# Patient Record
Sex: Female | Born: 1960 | Race: White | Hispanic: No | Smoking: Current every day smoker
Health system: Southern US, Community
[De-identification: ages and names within clinical notes are randomized; demographics above are authoritative.]

## PROBLEM LIST (undated history)

## (undated) DIAGNOSIS — C3431 Malignant neoplasm of lower lobe, right bronchus or lung: Secondary | ICD-10-CM

## (undated) DIAGNOSIS — F3181 Bipolar II disorder: Secondary | ICD-10-CM

## (undated) DIAGNOSIS — Z9181 History of falling: Secondary | ICD-10-CM

## (undated) DIAGNOSIS — F21 Schizotypal disorder: Secondary | ICD-10-CM

## (undated) DIAGNOSIS — J449 Chronic obstructive pulmonary disease, unspecified: Secondary | ICD-10-CM

## (undated) DIAGNOSIS — J45909 Unspecified asthma, uncomplicated: Secondary | ICD-10-CM

## (undated) DIAGNOSIS — IMO0002 Reserved for concepts with insufficient information to code with codable children: Secondary | ICD-10-CM

## (undated) DIAGNOSIS — K219 Gastro-esophageal reflux disease without esophagitis: Secondary | ICD-10-CM

## (undated) DIAGNOSIS — Z95828 Presence of other vascular implants and grafts: Secondary | ICD-10-CM

## (undated) DIAGNOSIS — I10 Essential (primary) hypertension: Secondary | ICD-10-CM

## (undated) HISTORY — PX: APPENDECTOMY: SHX54

## (undated) HISTORY — PX: STOMACH SURGERY: SHX791

## (undated) HISTORY — DX: Chronic obstructive pulmonary disease, unspecified: J44.9

## (undated) HISTORY — PX: NASAL SINUS SURGERY: SHX719

## (undated) HISTORY — DX: Presence of other vascular implants and grafts: Z95.828

## (undated) HISTORY — PX: ABDOMINAL HYSTERECTOMY: SHX81

## (undated) HISTORY — PX: OTHER SURGICAL HISTORY: SHX169

## (undated) HISTORY — DX: Malignant neoplasm of lower lobe, right bronchus or lung: C34.31

---

## 2006-07-21 ENCOUNTER — Emergency Department: Payer: Self-pay | Admitting: Emergency Medicine

## 2008-09-24 ENCOUNTER — Emergency Department: Payer: Self-pay | Admitting: Emergency Medicine

## 2011-06-23 ENCOUNTER — Emergency Department: Payer: Self-pay | Admitting: *Deleted

## 2014-09-27 DIAGNOSIS — F17200 Nicotine dependence, unspecified, uncomplicated: Secondary | ICD-10-CM | POA: Insufficient documentation

## 2014-09-27 DIAGNOSIS — M545 Low back pain, unspecified: Secondary | ICD-10-CM | POA: Insufficient documentation

## 2014-10-06 DIAGNOSIS — J449 Chronic obstructive pulmonary disease, unspecified: Secondary | ICD-10-CM | POA: Insufficient documentation

## 2014-10-06 DIAGNOSIS — J439 Emphysema, unspecified: Secondary | ICD-10-CM | POA: Insufficient documentation

## 2014-10-14 DIAGNOSIS — F39 Unspecified mood [affective] disorder: Secondary | ICD-10-CM | POA: Insufficient documentation

## 2015-06-21 DIAGNOSIS — I10 Essential (primary) hypertension: Secondary | ICD-10-CM | POA: Insufficient documentation

## 2015-06-21 DIAGNOSIS — R918 Other nonspecific abnormal finding of lung field: Secondary | ICD-10-CM | POA: Insufficient documentation

## 2015-06-21 DIAGNOSIS — B370 Candidal stomatitis: Secondary | ICD-10-CM | POA: Insufficient documentation

## 2015-06-26 DIAGNOSIS — R252 Cramp and spasm: Secondary | ICD-10-CM | POA: Insufficient documentation

## 2015-06-29 DIAGNOSIS — Z9189 Other specified personal risk factors, not elsewhere classified: Secondary | ICD-10-CM | POA: Insufficient documentation

## 2015-07-12 ENCOUNTER — Inpatient Hospital Stay: Payer: Self-pay | Attending: Oncology | Admitting: Oncology

## 2015-07-12 ENCOUNTER — Inpatient Hospital Stay: Payer: Self-pay

## 2015-07-12 ENCOUNTER — Encounter: Payer: Self-pay | Admitting: Oncology

## 2015-07-12 VITALS — BP 161/112 | HR 98 | Temp 97.8°F | Resp 18 | Ht 68.0 in | Wt 147.3 lb

## 2015-07-12 DIAGNOSIS — Z803 Family history of malignant neoplasm of breast: Secondary | ICD-10-CM

## 2015-07-12 DIAGNOSIS — F1910 Other psychoactive substance abuse, uncomplicated: Secondary | ICD-10-CM

## 2015-07-12 DIAGNOSIS — M25519 Pain in unspecified shoulder: Secondary | ICD-10-CM

## 2015-07-12 DIAGNOSIS — C3490 Malignant neoplasm of unspecified part of unspecified bronchus or lung: Secondary | ICD-10-CM

## 2015-07-12 DIAGNOSIS — M549 Dorsalgia, unspecified: Secondary | ICD-10-CM

## 2015-07-12 DIAGNOSIS — Z808 Family history of malignant neoplasm of other organs or systems: Secondary | ICD-10-CM

## 2015-07-12 DIAGNOSIS — G8929 Other chronic pain: Secondary | ICD-10-CM

## 2015-07-12 DIAGNOSIS — F17210 Nicotine dependence, cigarettes, uncomplicated: Secondary | ICD-10-CM

## 2015-07-12 DIAGNOSIS — R419 Unspecified symptoms and signs involving cognitive functions and awareness: Secondary | ICD-10-CM

## 2015-07-12 DIAGNOSIS — J449 Chronic obstructive pulmonary disease, unspecified: Secondary | ICD-10-CM

## 2015-07-12 DIAGNOSIS — C3431 Malignant neoplasm of lower lobe, right bronchus or lung: Secondary | ICD-10-CM

## 2015-07-12 DIAGNOSIS — F319 Bipolar disorder, unspecified: Secondary | ICD-10-CM

## 2015-07-12 DIAGNOSIS — R05 Cough: Secondary | ICD-10-CM

## 2015-07-12 HISTORY — DX: Malignant neoplasm of lower lobe, right bronchus or lung: C34.31

## 2015-07-12 LAB — COMPREHENSIVE METABOLIC PANEL
ALK PHOS: 65 U/L (ref 38–126)
ALT: 13 U/L — ABNORMAL LOW (ref 14–54)
ANION GAP: 6 (ref 5–15)
AST: 25 U/L (ref 15–41)
Albumin: 3.9 g/dL (ref 3.5–5.0)
BILIRUBIN TOTAL: 0.5 mg/dL (ref 0.3–1.2)
BUN: 14 mg/dL (ref 6–20)
CALCIUM: 8.8 mg/dL — AB (ref 8.9–10.3)
CO2: 27 mmol/L (ref 22–32)
Chloride: 102 mmol/L (ref 101–111)
Creatinine, Ser: 0.6 mg/dL (ref 0.44–1.00)
GFR calc non Af Amer: >60 mL/min (ref >60)
Glucose, Bld: 96 mg/dL (ref 65–99)
POTASSIUM: 3.9 mmol/L (ref 3.5–5.1)
SODIUM: 135 mmol/L (ref 135–145)
TOTAL PROTEIN: 6.8 g/dL (ref 6.5–8.1)

## 2015-07-12 LAB — CBC WITH DIFFERENTIAL/PLATELET
Basophils Absolute: 0.1 10*3/uL (ref 0–0.1)
Basophils Relative: 1 %
EOS ABS: 0.3 10*3/uL (ref 0–0.7)
Eosinophils Relative: 2 %
HEMATOCRIT: 38.4 % (ref 35.0–47.0)
HEMOGLOBIN: 12.9 g/dL (ref 12.0–16.0)
LYMPHS ABS: 2.2 10*3/uL (ref 1.0–3.6)
Lymphocytes Relative: 17 %
MCH: 32.4 pg (ref 26.0–34.0)
MCHC: 33.5 g/dL (ref 32.0–36.0)
MCV: 96.7 fL (ref 80.0–100.0)
MONO ABS: 0.8 10*3/uL (ref 0.2–0.9)
MONOS PCT: 6 %
NEUTROS PCT: 74 %
Neutro Abs: 9.5 10*3/uL — ABNORMAL HIGH (ref 1.4–6.5)
Platelets: 291 10*3/uL (ref 150–440)
RBC: 3.97 MIL/uL (ref 3.80–5.20)
RDW: 13.6 % (ref 11.5–14.5)
WBC: 12.8 10*3/uL — ABNORMAL HIGH (ref 3.6–11.0)

## 2015-07-12 LAB — LACTATE DEHYDROGENASE: LDH: 192 U/L (ref 98–192)

## 2015-07-12 MED ORDER — LORAZEPAM 1 MG PO TABS: ORAL_TABLET | ORAL | Status: DC

## 2015-07-12 NOTE — Progress Notes (Signed)
[name] @ [facility] Telephone:([phone]) [phone]  Fax:(336) Yell: [birth year]  MR#: [id number]  SEG#:[id number]  Charlene [name] Care Team: Jiles Garter, MD as PCP - General (Family Medicine)  CHIEF COMPLAINT:  Chief Complaint  Charlene [name] presents with  . 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  Needle aspiration of lymph node was positive for small cell carcinoma of lung. Clinically  Staged  As  T1 N2 M0 tumor.  Further staging workup is pending VISIT DIAGNOSIS:     ICD-9-CM ICD-10-CM   1. Small cell lung cancer, unspecified laterality (HCC) 162.9 C34.90 amLODipine (NORVASC) 5 MG tablet     diclofenac (VOLTAREN) 75 MG EC tablet     ondansetron (ZOFRAN) 4 MG tablet     DULoxetine (CYMBALTA) 60 MG capsule     benzonatate (TESSALON PERLES) 100 MG capsule     acetaminophen (TYLENOL) 500 MG tablet     cyclobenzaprine (FLEXERIL) 10 MG tablet     lidocaine (XYLOCAINE) 5 % ointment     lidocaine (ASPERCREME W/LIDOCAINE) 4 % cream     albuterol (PROVENTIL HFA;VENTOLIN HFA) 108 (90 Base) MCG/ACT inhaler     nystatin (MYCOSTATIN) 100000 UNIT/ML suspension     fluticasone (FLONASE) 50 MCG/ACT nasal spray     Saline (AYR SALINE NASAL DROPS) 0.65 % (Soln) SOLN     loratadine (CLARITIN) 10 MG tablet     CBC with Differential     Comprehensive metabolic panel     Lactate dehydrogenase     NM PET Image Initial (PI) Skull Base To Thigh     CT Head W Wo Contrast  2. Primary cancer of right lower lobe of lung (HCC) 162.5 C34.31       No history exists.    No flowsheet data found.  INTERVAL HISTORY: 55 year old Charlene [name] with a multiple other comorbid condition.  Had the polysubstance abuse.  Bipolar disease.  COPD.  Chronic back pain and shoulder pain.  Mood disorder. Charlene [name] presented to emergency room at [facility] complaining of back pain left  shoulder pain and had MRI scan done.  Charlene [name] complains of cough yellowish expectoration sometime blood stained sputum.  Sees very poor historian.  Extremely nervous and agitated Charlene [name].  Accompanied with his brother REVIEW OF SYSTEMS:   Gen. status: Extremely agitated and anxious Charlene [name].  Not any acute distress. Lungs: Increasing shortness of breath.  Cough. HEENT: No headache no dizziness. GI: No nausea no vomiting but has lost some weight.  No diarrhea.  No hematemesis or melena Musculoskeletal system back pain and left shoulder pain. Charlene [name] has chronic pain.  Recently had MRI scan of left shoulder which is was revealing tendon tear. Cardiac: No chest pain no palpitation GU: No dysuria hematuria Skin: No rash Lower extremity no swelling Psychiatric system: Charlene [name] has bipolar disease.  Polysubstance abuse. Charlene [name] lives with Charlene [name] father.   As per HPI. Otherwise, a complete review of systems is negatve.  PAST MEDICAL HISTORY: Past Medical History  Diagnosis Date  . Primary cancer of right lower lobe of lung ([place]) 07/12/2015    Small cell undifferentiated carcinoma of lung.  Diagnosis at [facility] by fine-needle aspiration of lymph node (January, 2017)    PAST SURGICAL HISTORY: Abdominal hernia surgery. Hysterectomy.  Appendectomy.  FAMILY HISTORY  BROTHER HAD CHOLANGIOCARCINOMA AND HODGKIN'S DISEASE.   OTHER SISTER HAD  A BREAST CANCER.   GYNECOLOGIC HISTORY:    Charlene [name]  had a hysterectomy in the past    ADVANCED DIRECTIVES:    HEALTH MAINTENANCE: Social History  Substance Use Topics  . Smoking status: Current Some Day Smoker  . Smokeless tobacco: None  . Alcohol Use: None     Colonoscopy:  PAP:  Bone density:  Lipid panel:  Allergies  Allergen Reactions  . Amoxicillin-Pot Clavulanate Hives    Also vomiting    Current Outpatient Prescriptions  Medication Sig Dispense Refill  . acetaminophen (TYLENOL) 500 MG tablet Take 1,000 mg by mouth.    Marland Kitchen albuterol  (PROVENTIL HFA;VENTOLIN HFA) 108 (90 Base) MCG/ACT inhaler Inhale into the lungs.    Marland Kitchen amLODipine (NORVASC) 5 MG tablet Take 5 mg by mouth.    . benzonatate (TESSALON PERLES) 100 MG capsule Take 100 mg by mouth.    . cyclobenzaprine (FLEXERIL) 10 MG tablet Take 10 mg by mouth.    . diclofenac (VOLTAREN) 75 MG EC tablet Take 75 mg by mouth.    . DULoxetine (CYMBALTA) 60 MG capsule Take 60 mg by mouth.    . fluticasone (FLONASE) 50 MCG/ACT nasal spray 1 spray by Each Nare route daily.    Marland Kitchen lidocaine (ASPERCREME W/LIDOCAINE) 4 % cream Apply 2 g topically Three (3) times a day as needed.    . lidocaine (XYLOCAINE) 5 % ointment Apply topically.    Marland Kitchen loratadine (CLARITIN) 10 MG tablet Take 10 mg by mouth.    . nystatin (MYCOSTATIN) 100000 UNIT/ML suspension Take by mouth.    . ondansetron (ZOFRAN) 4 MG tablet Take 4 mg by mouth.    . Saline (AYR SALINE NASAL DROPS) 0.65 % (Soln) SOLN 2 drops by Each Nare route every four (4) hours as needed.     No current facility-administered medications for this visit.    OBJECTIVE: PHYSICAL EXAM: Gen. status: Extremely anxious Charlene [name]. Head exam was generally normal. There was no scleral icterus or corneal arcus. Mucous membranes were moist. Examination of the throat is within normal limit Chest: Diminished air entry on both sides.  Occasional rhonchi.  Emphysema test chest. Cardiac: Tachycardia Abdominal exam revealed normal bowel sounds. The abdomen was soft, non-tender, and without masses, organomegaly, or appreciable enlargement of the abdominal aorta. Neurologically, the Charlene [name] was awake, alert, and oriented to person, place and time. There were no obvious focal neurologic abnormalities. Examination of the skin revealed no evidence of significant rashes, suspicious appearing nevi or other concerning lesions. Lower extremity no edema Lymphatic system: Supraclavicular, cervical, axillary, inguinal lymph nodes are not palpable   Filed Vitals:   07/12/15  0910  BP: 161/112  Pulse: 98  Temp: 97.8 F (36.6 C)  Resp: 18     There is no height on file to calculate BMI.    ECOG FS:1 - Symptomatic but completely ambulatory  LAB RESULTS:  Lab data from outside institution has been reviewed  CT scan from the [facility] system has been reviewed independently.    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 Proceed with PET scan to complete staging workup as well as CT scan of brain We discussed possibility of chemotherapy with Cis-platinum and VP-16 versus carboplatinum  and VP-16 depending on the stage of the disease Possibility of radiation therapy for localized disease is found in the chest Charlene [name] will attend chemotherapy class. Port will be placed.. All the side effects of chemotherapy including myelosuppression, alopecia, nausea vomiting fatigue weakness.  Secondary infection, and   peripheral neuropathy .  Has been discussed in details. Informal consent has been obtained and will be documented by nurses in the chart    Charlene [name] expressed understanding and was in agreement with this plan. Charlene [name] also understands that Charlene [name] can call clinic at any time with any questions, concerns, or complaints.    Primary cancer of right lower lobe of lung ([place])   Staging form: Lung, AJCC 7th Edition     Clinical: T1, N2, M0 - Signed by Forest Gleason, MD on 07/12/2015   Forest Gleason, MD   07/12/2015 9:46 AM

## 2015-07-12 NOTE — Patient Instructions (Signed)
Cisplatin injection What is this medicine? CISPLATIN (SIS pla tin) is a chemotherapy drug. It targets fast dividing cells, like cancer cells, and causes these cells to die. This medicine is used to treat many types of cancer like bladder, ovarian, and testicular cancers. This medicine may be used for other purposes; ask your health care provider or pharmacist if you have questions. What should I tell my health care provider before I take this medicine? They need to know if you have any of these conditions: -blood disorders -hearing problems -kidney disease -recent or ongoing radiation therapy -an unusual or allergic reaction to cisplatin, carboplatin, other chemotherapy, other medicines, foods, dyes, or preservatives -pregnant or trying to get pregnant -breast-feeding How should I use this medicine? This drug is given as an infusion into a vein. It is administered in a hospital or clinic by a specially trained health care professional. Talk to your pediatrician regarding the use of this medicine in children. Special care may be needed. Overdosage: If you think you have taken too much of this medicine contact a poison control center or emergency room at once. NOTE: This medicine is only for you. Do not share this medicine with others. What if I miss a dose? It is important not to miss a dose. Call your doctor or health care professional if you are unable to keep an appointment. What may interact with this medicine? -dofetilide -foscarnet -medicines for seizures -medicines to increase blood counts like filgrastim, pegfilgrastim, sargramostim -probenecid -pyridoxine used with altretamine -rituximab -some antibiotics like amikacin, gentamicin, neomycin, polymyxin B, streptomycin, tobramycin -sulfinpyrazone -vaccines -zalcitabine Talk to your doctor or health care professional before taking any of these medicines: -acetaminophen -aspirin -ibuprofen -ketoprofen -naproxen This list may  not describe all possible interactions. Give your health care provider a list of all the medicines, herbs, non-prescription drugs, or dietary supplements you use. Also tell them if you smoke, drink alcohol, or use illegal drugs. Some items may interact with your medicine. What should I watch for while using this medicine? Your condition will be monitored carefully while you are receiving this medicine. You will need important blood work done while you are taking this medicine. This drug may make you feel generally unwell. This is not uncommon, as chemotherapy can affect healthy cells as well as cancer cells. Report any side effects. Continue your course of treatment even though you feel ill unless your doctor tells you to stop. In some cases, you may be given additional medicines to help with side effects. Follow all directions for their use. Call your doctor or health care professional for advice if you get a fever, chills or sore throat, or other symptoms of a cold or flu. Do not treat yourself. This drug decreases your body's ability to fight infections. Try to avoid being around people who are sick. This medicine may increase your risk to bruise or bleed. Call your doctor or health care professional if you notice any unusual bleeding. Be careful brushing and flossing your teeth or using a toothpick because you may get an infection or bleed more easily. If you have any dental work done, tell your dentist you are receiving this medicine. Avoid taking products that contain aspirin, acetaminophen, ibuprofen, naproxen, or ketoprofen unless instructed by your doctor. These medicines may hide a fever. Do not become pregnant while taking this medicine. Women should inform their doctor if they wish to become pregnant or think they might be pregnant. There is a potential for serious side effects to   an unborn child. Talk to your health care professional or pharmacist for more information. Do not breast-feed an  infant while taking this medicine. Drink fluids as directed while you are taking this medicine. This will help protect your kidneys. Call your doctor or health care professional if you get diarrhea. Do not treat yourself. What side effects may I notice from receiving this medicine? Side effects that you should report to your doctor or health care professional as soon as possible: -allergic reactions like skin rash, itching or hives, swelling of the face, lips, or tongue -signs of infection - fever or chills, cough, sore throat, pain or difficulty passing urine -signs of decreased platelets or bleeding - bruising, pinpoint red spots on the skin, black, tarry stools, nosebleeds -signs of decreased red blood cells - unusually weak or tired, fainting spells, lightheadedness -breathing problems -changes in hearing -gout pain -low blood counts - This drug may decrease the number of white blood cells, red blood cells and platelets. You may be at increased risk for infections and bleeding. -nausea and vomiting -pain, swelling, redness or irritation at the injection site -pain, tingling, numbness in the hands or feet -problems with balance, movement -trouble passing urine or change in the amount of urine Side effects that usually do not require medical attention (report to your doctor or health care professional if they continue or are bothersome): -changes in vision -loss of appetite -metallic taste in the mouth or changes in taste This list may not describe all possible side effects. Call your doctor for medical advice about side effects. You may report side effects to FDA at 1-800-FDA-1088. Where should I keep my medicine? This drug is given in a hospital or clinic and will not be stored at home. NOTE: This sheet is a summary. It may not cover all possible information. If you have questions about this medicine, talk to your doctor, pharmacist, or health care provider.    2016, Elsevier/Gold  Standard. (2007-09-07 14:40:54) Etoposide, VP-16 injection What is this medicine? ETOPOSIDE, VP-16 (e toe POE side) is a chemotherapy drug. It is used to treat testicular cancer, lung cancer, and other cancers. This medicine may be used for other purposes; ask your health care provider or pharmacist if you have questions. What should I tell my health care provider before I take this medicine? They need to know if you have any of these conditions: -infection -kidney disease -low blood counts, like low white cell, platelet, or red cell counts -an unusual or allergic reaction to etoposide, other chemotherapeutic agents, other medicines, foods, dyes, or preservatives -pregnant or trying to get pregnant -breast-feeding How should I use this medicine? This medicine is for infusion into a vein. It is administered in a hospital or clinic by a specially trained health care professional. Talk to your pediatrician regarding the use of this medicine in children. Special care may be needed. Overdosage: If you think you have taken too much of this medicine contact a poison control center or emergency room at once. NOTE: This medicine is only for you. Do not share this medicine with others. What if I miss a dose? It is important not to miss your dose. Call your doctor or health care professional if you are unable to keep an appointment. What may interact with this medicine? -aspirin -certain medications for seizures like carbamazepine, phenobarbital, phenytoin, valproic acid -cyclosporine -levamisole -warfarin This list may not describe all possible interactions. Give your health care provider a list of all the medicines, herbs,  non-prescription drugs, or dietary supplements you use. Also tell them if you smoke, drink alcohol, or use illegal drugs. Some items may interact with your medicine. What should I watch for while using this medicine? Visit your doctor for checks on your progress. This drug may  make you feel generally unwell. This is not uncommon, as chemotherapy can affect healthy cells as well as cancer cells. Report any side effects. Continue your course of treatment even though you feel ill unless your doctor tells you to stop. In some cases, you may be given additional medicines to help with side effects. Follow all directions for their use. Call your doctor or health care professional for advice if you get a fever, chills or sore throat, or other symptoms of a cold or flu. Do not treat yourself. This drug decreases your body's ability to fight infections. Try to avoid being around people who are sick. This medicine may increase your risk to bruise or bleed. Call your doctor or health care professional if you notice any unusual bleeding. Be careful brushing and flossing your teeth or using a toothpick because you may get an infection or bleed more easily. If you have any dental work done, tell your dentist you are receiving this medicine. Avoid taking products that contain aspirin, acetaminophen, ibuprofen, naproxen, or ketoprofen unless instructed by your doctor. These medicines may hide a fever. Do not become pregnant while taking this medicine or for at least 6 months after stopping it. Women should inform their doctor if they wish to become pregnant or think they might be pregnant. Women of child-bearing potential will need to have a negative pregnancy test before starting this medicine. There is a potential for serious side effects to an unborn child. Talk to your health care professional or pharmacist for more information. Do not breast-feed an infant while taking this medicine. Men must use a latex condom during sexual contact with a woman while taking this medicine and for at least 4 months after stopping it. A latex condom is needed even if you have had a vasectomy. Contact your doctor right away if your partner becomes pregnant. Do not donate sperm while taking this medicine and for  at least 4 months after you stop taking this medicine. Men should inform their doctors if they wish to father a child. This medicine may lower sperm counts. What side effects may I notice from receiving this medicine? Side effects that you should report to your doctor or health care professional as soon as possible: -allergic reactions like skin rash, itching or hives, swelling of the face, lips, or tongue -low blood counts - this medicine may decrease the number of white blood cells, red blood cells and platelets. You may be at increased risk for infections and bleeding. -signs of infection - fever or chills, cough, sore throat, pain or difficulty passing urine -signs of decreased platelets or bleeding - bruising, pinpoint red spots on the skin, black, tarry stools, blood in the urine -signs of decreased red blood cells - unusually weak or tired, fainting spells, lightheadedness -breathing problems -changes in vision -mouth or throat sores or ulcers -pain, redness, swelling or irritation at the injection site -pain, tingling, numbness in the hands or feet -redness, blistering, peeling or loosening of the skin, including inside the mouth -seizures -vomiting Side effects that usually do not require medical attention (report to your doctor or health care professional if they continue or are bothersome): -diarrhea -hair loss -loss of appetite -nausea -stomach pain  This list may not describe all possible side effects. Call your doctor for medical advice about side effects. You may report side effects to FDA at 1-800-FDA-1088. Where should I keep my medicine? This drug is given in a hospital or clinic and will not be stored at home. NOTE: This sheet is a summary. It may not cover all possible information. If you have questions about this medicine, talk to your doctor, pharmacist, or health care provider.    2016, Elsevier/Gold Standard. (2014-01-26 12:32:50)

## 2015-07-12 NOTE — Progress Notes (Signed)
Patient here today as new evaluation for small cell lung cancer referred by Dr. Stoney Bang @ [facility].  States she also fell at work injuring her left shoulder.

## 2015-07-13 ENCOUNTER — Encounter: Payer: Self-pay | Admitting: Oncology

## 2015-07-13 ENCOUNTER — Telehealth: Payer: Self-pay | Admitting: *Deleted

## 2015-07-13 MED ORDER — LORAZEPAM 1 MG PO TABS: ORAL_TABLET | ORAL | Status: DC

## 2015-07-13 NOTE — Telephone Encounter (Signed)
Called to [facility] per pt request, call to [facility] to cancel rx sent there adn spoke with Corene Cornea

## 2015-07-16 ENCOUNTER — Ambulatory Visit
Admission: RE | Admit: 2015-07-16 | Discharge: 2015-07-16 | Disposition: A | Discharge status: Home / Self Care | Payer: Self-pay | Source: Ambulatory Visit | Attending: Oncology | Admitting: Oncology

## 2015-07-16 DIAGNOSIS — C3490 Malignant neoplasm of unspecified part of unspecified bronchus or lung: Secondary | ICD-10-CM

## 2015-07-16 DIAGNOSIS — C771 Secondary and unspecified malignant neoplasm of intrathoracic lymph nodes: Secondary | ICD-10-CM | POA: Insufficient documentation

## 2015-07-16 DIAGNOSIS — Z0189 Encounter for other specified special examinations: Secondary | ICD-10-CM | POA: Insufficient documentation

## 2015-07-16 LAB — GLUCOSE, CAPILLARY: GLUCOSE-CAPILLARY: 74 mg/dL (ref 65–99)

## 2015-07-16 MED ORDER — IOHEXOL 300 MG/ML  SOLN: 75.0000 mL | Freq: Once | INTRAMUSCULAR | Status: DC | PRN

## 2015-07-16 MED ORDER — FLUDEOXYGLUCOSE F - 18 (FDG) INJECTION
12.0000 | Freq: Once | INTRAVENOUS | Status: AC | PRN
  Administered 2015-07-16: 12 via INTRAVENOUS

## 2015-07-17 ENCOUNTER — Inpatient Hospital Stay: Payer: Self-pay

## 2015-07-17 NOTE — Progress Notes (Unsigned)
PSN met with patient and her father today.  PSN informed patient about the Social Security Disability process, as well as the financial hardship program through [employer].  Patient will receive assistance through the cancer center charitable funds.

## 2015-07-18 ENCOUNTER — Inpatient Hospital Stay: Payer: Medicaid Other | Attending: Oncology | Admitting: Oncology

## 2015-07-18 ENCOUNTER — Inpatient Hospital Stay: Payer: Medicaid Other

## 2015-07-18 ENCOUNTER — Encounter: Payer: Self-pay | Admitting: Oncology

## 2015-07-18 VITALS — BP 120/77 | HR 93

## 2015-07-18 VITALS — BP 146/98 | HR 96 | Temp 97.2°F | Resp 18 | Wt 151.1 lb

## 2015-07-18 DIAGNOSIS — R05 Cough: Secondary | ICD-10-CM | POA: Insufficient documentation

## 2015-07-18 DIAGNOSIS — F411 Generalized anxiety disorder: Secondary | ICD-10-CM

## 2015-07-18 DIAGNOSIS — J449 Chronic obstructive pulmonary disease, unspecified: Secondary | ICD-10-CM | POA: Diagnosis not present

## 2015-07-18 DIAGNOSIS — Z5111 Encounter for antineoplastic chemotherapy: Secondary | ICD-10-CM | POA: Insufficient documentation

## 2015-07-18 DIAGNOSIS — R112 Nausea with vomiting, unspecified: Secondary | ICD-10-CM | POA: Insufficient documentation

## 2015-07-18 DIAGNOSIS — C3431 Malignant neoplasm of lower lobe, right bronchus or lung: Secondary | ICD-10-CM | POA: Diagnosis not present

## 2015-07-18 DIAGNOSIS — Z90710 Acquired absence of both cervix and uterus: Secondary | ICD-10-CM | POA: Diagnosis not present

## 2015-07-18 DIAGNOSIS — M549 Dorsalgia, unspecified: Secondary | ICD-10-CM | POA: Insufficient documentation

## 2015-07-18 DIAGNOSIS — Z79899 Other long term (current) drug therapy: Secondary | ICD-10-CM | POA: Diagnosis not present

## 2015-07-18 DIAGNOSIS — F319 Bipolar disorder, unspecified: Secondary | ICD-10-CM | POA: Diagnosis not present

## 2015-07-18 DIAGNOSIS — Z87898 Personal history of other specified conditions: Secondary | ICD-10-CM | POA: Diagnosis not present

## 2015-07-18 DIAGNOSIS — C779 Secondary and unspecified malignant neoplasm of lymph node, unspecified: Secondary | ICD-10-CM

## 2015-07-18 DIAGNOSIS — F17210 Nicotine dependence, cigarettes, uncomplicated: Secondary | ICD-10-CM | POA: Insufficient documentation

## 2015-07-18 DIAGNOSIS — G8929 Other chronic pain: Secondary | ICD-10-CM | POA: Diagnosis not present

## 2015-07-18 DIAGNOSIS — C3490 Malignant neoplasm of unspecified part of unspecified bronchus or lung: Secondary | ICD-10-CM

## 2015-07-18 MED ORDER — SODIUM CHLORIDE 0.9 % IV SOLN
Freq: Once | INTRAVENOUS | Status: AC
  Administered 2015-07-18: 10:00:00 via INTRAVENOUS
  Filled 2015-07-18: qty 1000

## 2015-07-18 MED ORDER — OXYCODONE HCL 5 MG PO TABS
5.0000 mg | ORAL_TABLET | Freq: Once | ORAL | Status: AC
  Administered 2015-07-18: 5 mg via ORAL
  Filled 2015-07-18: qty 1

## 2015-07-18 MED ORDER — SODIUM CHLORIDE 0.9 % IV SOLN
70.0000 mg/m2 | Freq: Once | INTRAVENOUS | Status: AC
  Administered 2015-07-18: 125 mg via INTRAVENOUS
  Filled 2015-07-18: qty 125

## 2015-07-18 MED ORDER — PALONOSETRON HCL INJECTION 0.25 MG/5ML
0.2500 mg | Freq: Once | INTRAVENOUS | Status: AC
  Administered 2015-07-18: 0.25 mg via INTRAVENOUS
  Filled 2015-07-18: qty 5

## 2015-07-18 MED ORDER — SODIUM CHLORIDE 0.9 % IV SOLN
Freq: Once | INTRAVENOUS | Status: AC
  Administered 2015-07-18: 12:00:00 via INTRAVENOUS
  Filled 2015-07-18: qty 5

## 2015-07-18 MED ORDER — LORAZEPAM 2 MG/ML IJ SOLN
1.0000 mg | Freq: Once | INTRAMUSCULAR | Status: AC
Start: 2015-07-18 — End: 2015-07-18
  Administered 2015-07-18: 1 mg via INTRAVENOUS
  Filled 2015-07-18: qty 1

## 2015-07-18 MED ORDER — SODIUM CHLORIDE 0.9 % IV SOLN
80.0000 mg/m2 | Freq: Once | INTRAVENOUS | Status: AC
  Administered 2015-07-18: 140 mg via INTRAVENOUS
  Filled 2015-07-18: qty 7

## 2015-07-18 MED ORDER — POTASSIUM CHLORIDE 2 MEQ/ML IV SOLN
Freq: Once | INTRAVENOUS | Status: AC
  Administered 2015-07-18: 10:00:00 via INTRAVENOUS
  Filled 2015-07-18: qty 1000

## 2015-07-18 NOTE — Progress Notes (Signed)
[place] @ [facility] Telephone:([phone]  Fax:([phone]) [place]: [birth year]  MR#: [id number]  JYN#:[id number]  Patient Care Team: Jiles Garter, MD as PCP - General (Family Medicine)  CHIEF COMPLAINT:  Chief Complaint  Patient presents with  . 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  Needle aspiration of lymph node was positive for small cell carcinoma of lung. Clinically  Staged  As  T1 N2 M0 tumor.    2.PET scan shows localized disease.MRI of brain is negative for any metastases  3.  Chemotherapy with cis-platinum and VP-16 has been started on July 18, 2015 VISIT DIAGNOSIS:   No diagnosis found.    No history exists.    No flowsheet data found.  INTERVAL HISTORY: 55 year old lady with a multiple other comorbid condition.  Had the polysubstance abuse.  Bipolar disease.  COPD.  Chronic back pain and shoulder pain.  Mood disorder. Patient presented to emergency room at [facility] complaining of back pain left shoulder pain and had MRI scan done.  Patient complains of cough yellowish expectoration sometime blood stained sputum.  Sees very poor historian.  Extremely nervous and agitated lady.  Accompanied with his brother, Assistant continues to have chronic back pain neck pain.  Cough.  Occasional nausea and vomiting. Had a staging PET scan An brain MRI scan  REVIEW OF SYSTEMS:   Gen. status: Extremely agitated and anxious lady.  Not any acute distress. Lungs: Increasing shortness of breath.  Cough. HEENT: No headache no dizziness. GI: No nausea no vomiting but has lost some weight.  No diarrhea.  No hematemesis or melena Musculoskeletal system back pain and left shoulder pain. Patient has chronic pain.  Recently had MRI scan of left shoulder which is was revealing tendon tear. Cardiac: No chest pain no palpitation GU:  No dysuria hematuria Skin: No rash Lower extremity no swelling Psychiatric system: Patient has bipolar disease.  Polysubstance abuse. Patient lives with her father.   As per HPI. Otherwise, a complete review of systems is negatve.  PAST MEDICAL HISTORY: Past Medical History  Diagnosis Date  . Primary cancer of right lower lobe of lung ([place]) 07/12/2015    Small cell undifferentiated carcinoma of lung.  Diagnosis at [facility] by fine-needle aspiration of lymph node (January, 2017)    PAST SURGICAL HISTORY: Abdominal hernia surgery. Hysterectomy.  Appendectomy.  FAMILY HISTORY  BROTHER HAD CHOLANGIOCARCINOMA AND HODGKIN'S DISEASE.   OTHER SISTER HAD A BREAST CANCER.   GYNECOLOGIC HISTORY:    Patient  had a hysterectomy in the past    ADVANCED DIRECTIVES:    HEALTH MAINTENANCE: Social History  Substance Use Topics  . Smoking status: Current Some Day Smoker  . Smokeless tobacco: None  . Alcohol Use: None       Allergies  Allergen Reactions  . Amoxicillin-Pot Clavulanate Hives    Also vomiting    Current Outpatient Prescriptions  Medication Sig Dispense Refill  . acetaminophen (TYLENOL) 500 MG tablet Take 1,000 mg by mouth.    Marland Kitchen albuterol (PROVENTIL HFA;VENTOLIN HFA) 108 (90 Base) MCG/ACT inhaler Inhale into the lungs.    Marland Kitchen amLODipine (NORVASC) 5 MG tablet Take 5 mg by mouth.    . benzonatate (TESSALON PERLES) 100 MG capsule Take 100 mg by mouth.    . cyclobenzaprine (FLEXERIL) 10 MG tablet Take 10 mg by mouth.    Marland Kitchen  diclofenac (VOLTAREN) 75 MG EC tablet Take 75 mg by mouth.    . DULoxetine (CYMBALTA) 60 MG capsule Take 60 mg by mouth.    . fluticasone (FLONASE) 50 MCG/ACT nasal spray 1 spray by Each Nare route daily.    Marland Kitchen lidocaine (ASPERCREME W/LIDOCAINE) 4 % cream Apply 2 g topically Three (3) times a day as needed.    . lidocaine (XYLOCAINE) 5 % ointment Apply topically.    Marland Kitchen loratadine (CLARITIN) 10 MG tablet Take 10 mg by mouth.    Marland Kitchen LORazepam (ATIVAN) 1  MG tablet Take 1 tablet by mouth once 1 hour prior to scan 3 tablet 0  . nystatin (MYCOSTATIN) 100000 UNIT/ML suspension Take by mouth.    . ondansetron (ZOFRAN) 4 MG tablet Take 4 mg by mouth.    . Saline (AYR SALINE NASAL DROPS) 0.65 % (Soln) SOLN 2 drops by Each Nare route every four (4) hours as needed.     No current facility-administered medications for this visit.    OBJECTIVE: PHYSICAL EXAM: Gen. status: Extremely anxious lady. Head exam was generally normal. There was no scleral icterus or corneal arcus. Mucous membranes were moist. Examination of the throat is within normal limit Chest: Diminished air entry on both sides.  Occasional rhonchi.  Emphysema test chest. Cardiac: Tachycardia Abdominal exam revealed normal bowel sounds. The abdomen was soft, non-tender, and without masses, organomegaly, or appreciable enlargement of the abdominal aorta. Neurologically, the patient was awake, alert, and oriented to person, place and time. There were no obvious focal neurologic abnormalities. Examination of the skin revealed no evidence of significant rashes, suspicious appearing nevi or other concerning lesions. Lower extremity no edema Lymphatic system: Supraclavicular, cervical, axillary, inguinal lymph nodes are not palpable   Filed Vitals:   07/18/15 0905  BP: 146/98  Pulse: 96  Temp: 97.2 F (36.2 C)  Resp: 18     Body mass index is 22.98 kg/(m^2).    ECOG FS:1 - Symptomatic but completely ambulatory  LAB RESULTS:  Lab data from outside institution has been reviewed  CT scan from the [facility] system has been reviewed independently.    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  2.proceed with port placement 3.  Proceed with chemotherapy with cis-platinum and VP-16 4.  Dr. Donella Stade would be evaluating this patient for radiation therapy and has been discussed with him 5.  All PET  scan and CT scan and MRI scan has been reviewed independently 6.  A cause of patient's multiple psychiatric issue patient will be referred to mental health for further treatment consideration Issue has been discussed with family regarding her anxiety and bipolar syndrome Patient was explained all the side effects of chemotherapy.  And informed consent has been obtained Intent of chemotherapy is palliation and relief in symptoms and extending survival All the side effects of chemotherapy including myelosuppression, alopecia, nausea vomiting fatigue weakness.  Secondary infection, and   peripheral neuropathy .  Has been discussed in details. Informal consent has been obtained and will be documented by nurses in the chart  Discussed situation with patient's father as well as brother regarding overall prognosis side effect of chemotherapy and difficulty taking care of this patient because of multiple psychiatric problems  Total duration of visit was 45 minutes.  50% or more time was spent in counseling patient and family regarding prognosis and options of treatment and available resources   Patient  expressed understanding and was in agreement with this plan. She also understands that She can call clinic at any time with any questions, concerns, or complaints.    Primary cancer of right lower lobe of lung ([place])   Staging form: Lung, AJCC 7th Edition     Clinical: T1, N2, M0 - Signed by Forest Gleason, MD on 07/12/2015   Forest Gleason, MD   07/18/2015 9:31 AM

## 2015-07-18 NOTE — Progress Notes (Signed)
Patient states she has been coughing and throwing up for the past 3 weeks.  She has basically no appetite.  Also states she is not sleeping well.  Patient asking for Ativan or something to relax her today prior to 1st chemo treatment.

## 2015-07-19 ENCOUNTER — Inpatient Hospital Stay: Payer: Medicaid Other

## 2015-07-19 VITALS — BP 142/81 | HR 98 | Temp 98.8°F | Resp 18

## 2015-07-19 DIAGNOSIS — C3431 Malignant neoplasm of lower lobe, right bronchus or lung: Secondary | ICD-10-CM

## 2015-07-19 DIAGNOSIS — Z5111 Encounter for antineoplastic chemotherapy: Secondary | ICD-10-CM | POA: Diagnosis not present

## 2015-07-19 MED ORDER — LORAZEPAM 2 MG/ML IJ SOLN
1.0000 mg | Freq: Once | INTRAMUSCULAR | Status: AC
  Administered 2015-07-19: 1 mg via INTRAVENOUS
  Filled 2015-07-19: qty 1

## 2015-07-19 MED ORDER — SODIUM CHLORIDE 0.9 % IV SOLN
Freq: Once | INTRAVENOUS | Status: AC
  Administered 2015-07-19: 15:00:00 via INTRAVENOUS
  Filled 2015-07-19: qty 4

## 2015-07-19 MED ORDER — SODIUM CHLORIDE 0.9 % IV SOLN
80.0000 mg/m2 | Freq: Once | INTRAVENOUS | Status: AC
  Administered 2015-07-19: 140 mg via INTRAVENOUS
  Filled 2015-07-19: qty 7

## 2015-07-19 MED ORDER — ONDANSETRON HCL 40 MG/20ML IJ SOLN: Freq: Once | INTRAMUSCULAR | Status: DC

## 2015-07-19 MED ORDER — SODIUM CHLORIDE 0.9 % IV SOLN
10.0000 mg | Freq: Once | INTRAVENOUS | Status: DC
  Filled 2015-07-19: qty 1

## 2015-07-19 MED ORDER — SODIUM CHLORIDE 0.9 % IV SOLN
Freq: Once | INTRAVENOUS | Status: AC
  Administered 2015-07-19: 15:00:00 via INTRAVENOUS
  Filled 2015-07-19: qty 1000

## 2015-07-19 MED ORDER — SODIUM CHLORIDE 0.9 % IV SOLN: 10.0000 mg | Freq: Once | INTRAVENOUS | Status: DC

## 2015-07-19 NOTE — Progress Notes (Signed)
Patient got aloxi on 2/1.  Patient ordered zofran on 2/2.  Contacted MD.  Wants zofran today b/c patient is nauseous.

## 2015-07-20 ENCOUNTER — Inpatient Hospital Stay: Payer: Medicaid Other

## 2015-07-20 ENCOUNTER — Encounter: Payer: Self-pay | Admitting: Radiation Oncology

## 2015-07-20 VITALS — BP 115/95 | HR 75 | Temp 97.5°F | Resp 18

## 2015-07-20 DIAGNOSIS — Z5111 Encounter for antineoplastic chemotherapy: Secondary | ICD-10-CM | POA: Diagnosis not present

## 2015-07-20 DIAGNOSIS — C3431 Malignant neoplasm of lower lobe, right bronchus or lung: Secondary | ICD-10-CM

## 2015-07-20 MED ORDER — SODIUM CHLORIDE 0.9 % IV SOLN
80.0000 mg/m2 | Freq: Once | INTRAVENOUS | Status: AC
  Administered 2015-07-20: 140 mg via INTRAVENOUS
  Filled 2015-07-20: qty 7

## 2015-07-20 MED ORDER — LORAZEPAM 2 MG/ML IJ SOLN
1.0000 mg | Freq: Once | INTRAMUSCULAR | Status: AC
  Administered 2015-07-20: 1 mg via INTRAVENOUS
  Filled 2015-07-20: qty 1

## 2015-07-20 MED ORDER — ONDANSETRON HCL 40 MG/20ML IJ SOLN: Freq: Once | INTRAMUSCULAR | Status: DC

## 2015-07-20 MED ORDER — SODIUM CHLORIDE 0.9 % IV SOLN
Freq: Once | INTRAVENOUS | Status: AC
  Administered 2015-07-20: 14:00:00 via INTRAVENOUS
  Filled 2015-07-20: qty 4

## 2015-07-20 MED ORDER — SODIUM CHLORIDE 0.9 % IV SOLN
Freq: Once | INTRAVENOUS | Status: AC
  Administered 2015-07-20: 14:00:00 via INTRAVENOUS
  Filled 2015-07-20: qty 1000

## 2015-07-20 MED ORDER — SODIUM CHLORIDE 0.9 % IV SOLN: 10.0000 mg | Freq: Once | INTRAVENOUS | Status: DC

## 2015-07-21 ENCOUNTER — Encounter: Payer: Self-pay | Admitting: Oncology

## 2015-07-21 ENCOUNTER — Encounter: Payer: Self-pay | Admitting: Radiation Oncology

## 2015-07-23 ENCOUNTER — Encounter: Payer: Self-pay | Admitting: Oncology

## 2015-07-24 ENCOUNTER — Ambulatory Visit
Admission: RE | Admit: 2015-07-24 | Discharge: 2015-07-24 | Disposition: A | Discharge status: Home / Self Care | Payer: Medicaid Other | Source: Ambulatory Visit | Attending: Radiation Oncology | Admitting: Radiation Oncology

## 2015-07-24 ENCOUNTER — Other Ambulatory Visit: Payer: Self-pay | Admitting: Vascular Surgery

## 2015-07-24 ENCOUNTER — Other Ambulatory Visit: Payer: Self-pay | Admitting: *Deleted

## 2015-07-24 ENCOUNTER — Encounter: Payer: Self-pay | Admitting: Radiation Oncology

## 2015-07-24 ENCOUNTER — Inpatient Hospital Stay: Payer: Medicaid Other

## 2015-07-24 ENCOUNTER — Telehealth: Payer: Self-pay | Admitting: *Deleted

## 2015-07-24 VITALS — BP 140/92 | HR 111 | Temp 97.0°F | Resp 20 | Wt 145.1 lb

## 2015-07-24 DIAGNOSIS — Z510 Encounter for antineoplastic radiation therapy: Secondary | ICD-10-CM | POA: Insufficient documentation

## 2015-07-24 DIAGNOSIS — F411 Generalized anxiety disorder: Secondary | ICD-10-CM

## 2015-07-24 DIAGNOSIS — J449 Chronic obstructive pulmonary disease, unspecified: Secondary | ICD-10-CM | POA: Insufficient documentation

## 2015-07-24 DIAGNOSIS — Z5111 Encounter for antineoplastic chemotherapy: Secondary | ICD-10-CM | POA: Diagnosis not present

## 2015-07-24 DIAGNOSIS — F319 Bipolar disorder, unspecified: Secondary | ICD-10-CM | POA: Insufficient documentation

## 2015-07-24 DIAGNOSIS — Z79899 Other long term (current) drug therapy: Secondary | ICD-10-CM | POA: Insufficient documentation

## 2015-07-24 DIAGNOSIS — F1910 Other psychoactive substance abuse, uncomplicated: Secondary | ICD-10-CM | POA: Insufficient documentation

## 2015-07-24 DIAGNOSIS — C3431 Malignant neoplasm of lower lobe, right bronchus or lung: Secondary | ICD-10-CM

## 2015-07-24 LAB — CBC WITH DIFFERENTIAL/PLATELET
BASOS ABS: 0 10*3/uL (ref 0–0.1)
BASOS PCT: 0 %
EOS ABS: 0.1 10*3/uL (ref 0–0.7)
Eosinophils Relative: 1 %
HCT: 40.6 % (ref 35.0–47.0)
HEMOGLOBIN: 13.9 g/dL (ref 12.0–16.0)
LYMPHS ABS: 2.4 10*3/uL (ref 1.0–3.6)
LYMPHS PCT: 27 %
MCH: 32.8 pg (ref 26.0–34.0)
MCHC: 34.2 g/dL (ref 32.0–36.0)
MCV: 95.9 fL (ref 80.0–100.0)
MONO ABS: 0.1 10*3/uL — AB (ref 0.2–0.9)
MONOS PCT: 1 %
NEUTROS ABS: 6.3 10*3/uL (ref 1.4–6.5)
NEUTROS PCT: 71 %
Platelets: 252 10*3/uL (ref 150–440)
RBC: 4.24 MIL/uL (ref 3.80–5.20)
RDW: 13.5 % (ref 11.5–14.5)
WBC: 8.9 10*3/uL (ref 3.6–11.0)

## 2015-07-24 MED ORDER — HYDROCODONE-ACETAMINOPHEN 5-325 MG PO TABS: 1.0000 | ORAL_TABLET | Freq: Four times a day (QID) | ORAL | Status: DC | PRN

## 2015-07-24 NOTE — Consult Note (Signed)
Except an outstanding is perfect of Radiation Oncology NEW PATIENT EVALUATION  Name: Charlene [name]  MRN: [id number]  Date:   07/24/2015     DOB: March 07, 1961   This 55 y.o. female patient presents to the clinic for initial evaluation of limited stage small cell lung cancer (T1 N2 M0).  REFERRING PHYSICIAN: Sison, Adele Dan, MD  CHIEF COMPLAINT:  Chief Complaint  Patient presents with  . Lung Cancer    Pt is here for initial consultation of lung cancer.      DIAGNOSIS: The encounter diagnosis was Malignant neoplasm of lower lobe of right lung ([place]).   PREVIOUS INVESTIGATIONS:  PET CT scan MRI of brain all reviewed Clinical notes reviewed Pathology report reviewed  HPI: Patient is a 55 year old female with multiple comorbidities including polysubstance abuse bipolar disorder COPD and chronic back and shoulder pain presented the emergency room at [facility] complaining of left shoulder pain. She had a yellowish productive cough sometimes blood tinged was found on CT scan and chest x-ray to have a right lower lobe mass with possible right hilar adenopathy. This was confirmed on PET CT scan. CT of her brain demonstrate no evidence of metastatic disease. She underwent bronchoscopy with biopsy of her right hilar lymph node which was positive for small cell undifferentiated carcinoma. She is having a port placed has already been started on cis-platinum VP-16. She is now referred to radiation oncology for consideration of concurrent treatment. She is breathing somewhat better she states her sinus infection seems improved she's having no productive cough at this time. She still has problems laying on her back causing significant pain in her left shoulder.  PLANNED TREATMENT REGIMEN: Concurrent I MRT radiation therapy with chemotherapy  PAST MEDICAL HISTORY:  has a past medical history of Primary cancer of right lower lobe of lung ([place]) (07/12/2015).    PAST SURGICAL HISTORY: History reviewed. No pertinent  past surgical history.  FAMILY HISTORY: family history is not on file.  SOCIAL HISTORY:  reports that she has been smoking.  She does not have any smokeless tobacco history on file.  ALLERGIES: Amoxicillin-pot clavulanate  MEDICATIONS:  Current Outpatient Prescriptions  Medication Sig Dispense Refill  . acetaminophen (TYLENOL) 500 MG tablet Take 1,000 mg by mouth.    Marland Kitchen albuterol (PROVENTIL HFA;VENTOLIN HFA) 108 (90 Base) MCG/ACT inhaler Inhale into the lungs.    Marland Kitchen amLODipine (NORVASC) 5 MG tablet Take 5 mg by mouth.    . cyclobenzaprine (FLEXERIL) 10 MG tablet Take 10 mg by mouth.    . diclofenac (VOLTAREN) 75 MG EC tablet Take 75 mg by mouth.    . DULoxetine (CYMBALTA) 60 MG capsule Take 60 mg by mouth.    . fluticasone (FLONASE) 50 MCG/ACT nasal spray 1 spray by Each Nare route daily.    Marland Kitchen lidocaine (ASPERCREME W/LIDOCAINE) 4 % cream Apply 2 g topically Three (3) times a day as needed.    . lidocaine (XYLOCAINE) 5 % ointment Apply topically.    Marland Kitchen loratadine (CLARITIN) 10 MG tablet Take 10 mg by mouth.    Marland Kitchen LORazepam (ATIVAN) 1 MG tablet Take 1 tablet by mouth once 1 hour prior to scan 3 tablet 0  . nystatin (MYCOSTATIN) 100000 UNIT/ML suspension Take by mouth.    . ondansetron (ZOFRAN) 4 MG tablet Take 4 mg by mouth.    . Saline (AYR SALINE NASAL DROPS) 0.65 % (Soln) SOLN 2 drops by Each Nare route every four (4) hours as needed.    . benzonatate (  TESSALON PERLES) 100 MG capsule Take 100 mg by mouth.    Marland Kitchen HYDROcodone-acetaminophen (NORCO) 5-325 MG tablet Take 1 tablet by mouth every 6 (six) hours as needed for moderate pain. 20 tablet 0   No current facility-administered medications for this encounter.    ECOG PERFORMANCE STATUS:  0 - Asymptomatic  REVIEW OF SYSTEMS: Aside from her multiple mental issues and sinus infection and productive cough which is cleared Patient denies any weight loss, fatigue, weakness, fever, chills or night sweats. Patient denies any loss of vision,  blurred vision. Patient denies any ringing  of the ears or hearing loss. No irregular heartbeat. Patient denies heart murmur or history of fainting. Patient denies any chest pain or pain radiating to her upper extremities. Patient denies any shortness of breath, difficulty breathing at night, cough or hemoptysis. Patient denies any swelling in the lower legs. Patient denies any nausea vomiting, vomiting of blood, or coffee ground material in the vomitus. Patient denies any stomach pain. Patient states has had normal bowel movements no significant constipation or diarrhea. Patient denies any dysuria, hematuria or significant nocturia. Patient denies any problems walking, swelling in the joints or loss of balance. Patient denies any skin changes, loss of hair or loss of weight. Patient denies any excessive worrying or anxiety or significant depression. Patient denies any problems with insomnia. Patient denies excessive thirst, polyuria, polydipsia. Patient denies any swollen glands, patient denies easy bruising or easy bleeding. Patient denies any recent infections, allergies or URI. Patient "s visual fields have not changed significantly in recent time.    PHYSICAL EXAM: BP 140/92 mmHg  Pulse 111  Temp(Src) 97 F (36.1 C)  Resp 20  Wt 145 lb 1 oz (65.8 kg) A well-developed female in NAD. Well-developed well-nourished patient in NAD. HEENT reveals PERLA, EOMI, discs not visualized.  Oral cavity is clear. No oral mucosal lesions are identified. Neck is clear without evidence of cervical or supraclavicular adenopathy. Lungs are clear to A&P. Cardiac examination is essentially unremarkable with regular rate and rhythm without murmur rub or thrill. Abdomen is benign with no organomegaly or masses noted. Motor sensory and DTR levels are equal and symmetric in the upper and lower extremities. Cranial nerves II through XII are grossly intact. Proprioception is intact. No peripheral adenopathy or edema is  identified. No motor or sensory levels are noted. Crude visual fields are within normal range.  LABORATORY DATA: Pathology reports reviewed    RADIOLOGY RESULTS: PET CT scan and CT scan of brain reviewed   IMPRESSION: Limited stage small cell lung cancer of the right lung in 55 year old female with multiple medical comorbidities already started on chemotherapy for concurrent radiation  PLAN: At this time I to go ahead with concurrent radiation therapy to her right lower lobe and right hilar region based on hypermetabolic findings on PET CT scan. Would plan on delivering 6000 cGy over 6 weeks to those areas. Based on her anatomy and first assessment believe I am RT treatment planning and delivery would best accomplished my goals with dose reduction to normal lung volume heart esophagus and spinal cord. Risks and benefits of treatment including increased cough fatigue destruction of normal lung volume alteration of blood counts possible dysphasia and skin reaction all were discussed in detail with the patient and her father present. I have set up and ordered CT simulation early next week. She scheduled for port placement. Patient has difficulty laying down and I have written her prescription for Vicodin to be used one  hour before simulation and treatment each day to try to accommodate some of her postural pain.  I would like to take this opportunity for allowing me to participate in the care of your patient.Armstead Peaks., MD

## 2015-07-24 NOTE — Telephone Encounter (Signed)
Patient called asking for phone number to mental health.  She has an appointment the same day she is scheduled to have her port placed.  Gave patient phone number for mental health - [phone].  Patient read number back and thanked me for the call.

## 2015-07-25 ENCOUNTER — Inpatient Hospital Stay: Payer: Medicaid Other

## 2015-07-25 ENCOUNTER — Ambulatory Visit: Payer: Self-pay | Admitting: Radiation Oncology

## 2015-07-28 DIAGNOSIS — Z0000 Encounter for general adult medical examination without abnormal findings: Secondary | ICD-10-CM | POA: Insufficient documentation

## 2015-07-30 ENCOUNTER — Ambulatory Visit: Payer: Medicaid Other

## 2015-07-30 ENCOUNTER — Ambulatory Visit
Admission: RE | Admit: 2015-07-30 | Discharge: 2015-07-30 | Disposition: A | Discharge status: Home / Self Care | Payer: Medicaid Other | Source: Ambulatory Visit | Attending: Vascular Surgery | Admitting: Vascular Surgery

## 2015-07-30 ENCOUNTER — Encounter: Payer: Self-pay | Admitting: *Deleted

## 2015-07-30 ENCOUNTER — Encounter
Admission: RE | Disposition: A | Discharge status: Home / Self Care | Payer: Self-pay | Source: Ambulatory Visit | Attending: Vascular Surgery

## 2015-07-30 ENCOUNTER — Ambulatory Visit: Payer: Self-pay | Admitting: Psychiatry

## 2015-07-30 DIAGNOSIS — C3490 Malignant neoplasm of unspecified part of unspecified bronchus or lung: Secondary | ICD-10-CM | POA: Diagnosis not present

## 2015-07-30 DIAGNOSIS — Z79899 Other long term (current) drug therapy: Secondary | ICD-10-CM | POA: Diagnosis not present

## 2015-07-30 DIAGNOSIS — R87619 Unspecified abnormal cytological findings in specimens from cervix uteri: Secondary | ICD-10-CM | POA: Insufficient documentation

## 2015-07-30 DIAGNOSIS — F17200 Nicotine dependence, unspecified, uncomplicated: Secondary | ICD-10-CM | POA: Insufficient documentation

## 2015-07-30 DIAGNOSIS — N879 Dysplasia of cervix uteri, unspecified: Secondary | ICD-10-CM | POA: Insufficient documentation

## 2015-07-30 HISTORY — PX: PERIPHERAL VASCULAR CATHETERIZATION: SHX172C

## 2015-07-30 SURGERY — PORTA CATH INSERTION
Anesthesia: Moderate Sedation

## 2015-07-30 MED ORDER — MIDAZOLAM HCL 2 MG/2ML IJ SOLN
INTRAMUSCULAR | Status: DC | PRN
  Administered 2015-07-30: 1 mg via INTRAVENOUS
  Administered 2015-07-30: 2 mg via INTRAVENOUS

## 2015-07-30 MED ORDER — FENTANYL CITRATE (PF) 100 MCG/2ML IJ SOLN
INTRAMUSCULAR | Status: DC | PRN
  Administered 2015-07-30 (×2): 50 ug via INTRAVENOUS

## 2015-07-30 MED ORDER — LIDOCAINE-EPINEPHRINE (PF) 1 %-1:200000 IJ SOLN
INTRAMUSCULAR | Status: DC | PRN
  Administered 2015-07-30: 20 mL via INTRADERMAL

## 2015-07-30 MED ORDER — GENTAMICIN SULFATE 40 MG/ML IJ SOLN
Freq: Once | INTRAMUSCULAR | Status: AC
  Administered 2015-07-30: 13:00:00
  Filled 2015-07-30: qty 2

## 2015-07-30 MED ORDER — CLINDAMYCIN PHOSPHATE 300 MG/50ML IV SOLN
INTRAVENOUS | Status: AC
  Filled 2015-07-30: qty 50

## 2015-07-30 MED ORDER — ONDANSETRON HCL 4 MG/2ML IJ SOLN
4.0000 mg | Freq: Four times a day (QID) | INTRAMUSCULAR | Status: DC | PRN
  Administered 2015-07-30: 4 mg via INTRAVENOUS

## 2015-07-30 MED ORDER — HYDROMORPHONE HCL 1 MG/ML IJ SOLN
0.5000 mg | INTRAMUSCULAR | Status: DC | PRN
  Administered 2015-07-30: 1 mg via INTRAVENOUS

## 2015-07-30 MED ORDER — ONDANSETRON HCL 4 MG/2ML IJ SOLN
INTRAMUSCULAR | Status: AC
  Filled 2015-07-30: qty 2

## 2015-07-30 MED ORDER — OXYCODONE-ACETAMINOPHEN 5-325 MG PO TABS
ORAL_TABLET | ORAL | Status: AC
  Filled 2015-07-30: qty 1

## 2015-07-30 MED ORDER — OXYCODONE-ACETAMINOPHEN 7.5-325 MG PO TABS: 1.0000 | ORAL_TABLET | ORAL | Status: DC | PRN

## 2015-07-30 MED ORDER — HYDROMORPHONE HCL 1 MG/ML IJ SOLN: 1.0000 mg | Freq: Once | INTRAMUSCULAR | Status: DC

## 2015-07-30 MED ORDER — OXYCODONE-ACETAMINOPHEN 5-325 MG PO TABS
1.0000 | ORAL_TABLET | Freq: Once | ORAL | Status: AC
  Administered 2015-07-30: 1 via ORAL

## 2015-07-30 MED ORDER — SODIUM CHLORIDE 0.9 % IV SOLN
INTRAVENOUS | Status: DC
  Administered 2015-07-30: 11:00:00 via INTRAVENOUS

## 2015-07-30 MED ORDER — MIDAZOLAM HCL 5 MG/5ML IJ SOLN
INTRAMUSCULAR | Status: AC
  Filled 2015-07-30: qty 5

## 2015-07-30 MED ORDER — CLINDAMYCIN PHOSPHATE 300 MG/50ML IV SOLN
300.0000 mg | Freq: Once | INTRAVENOUS | Status: AC
  Administered 2015-07-30: 300 mg via INTRAVENOUS

## 2015-07-30 MED ORDER — HYDROMORPHONE HCL 1 MG/ML IJ SOLN
INTRAMUSCULAR | Status: AC
  Filled 2015-07-30: qty 1

## 2015-07-30 MED ORDER — FENTANYL CITRATE (PF) 100 MCG/2ML IJ SOLN
INTRAMUSCULAR | Status: AC
  Filled 2015-07-30: qty 2

## 2015-07-30 SURGICAL SUPPLY — 10 items
BAG DECANTER STRL (MISCELLANEOUS) ×3 IMPLANT
KIT PORT POWER 8FR ISP CVUE (Catheter) ×3 IMPLANT
PACK ANGIOGRAPHY (CUSTOM PROCEDURE TRAY) ×3 IMPLANT
PAD GROUND ADULT SPLIT (MISCELLANEOUS) ×3 IMPLANT
PENCIL ELECTRO HAND CTR (MISCELLANEOUS) ×3 IMPLANT
PREP CHG 10.5 TEAL (MISCELLANEOUS) ×3 IMPLANT
SUT MNCRL AB 4-0 PS2 18 (SUTURE) ×3 IMPLANT
SUT PROLENE 0 CT 1 30 (SUTURE) ×3 IMPLANT
SUTURE VIC 3-0 (SUTURE) ×3 IMPLANT
TOWEL OR 17X26 4PK STRL BLUE (TOWEL DISPOSABLE) ×3 IMPLANT

## 2015-07-30 NOTE — Op Note (Signed)
      [facility]       Operative Note  Date: 07/30/2015  Preoperative diagnosis:  1. Lung cancer  Postoperative diagnosis:  Same as above  Procedures: #1. Ultrasound guidance for vascular access to the right internal jugular vein. #2. Fluoroscopic guidance for placement of catheter. #3. Placement of CT compatible Port-A-Cath, right internal jugular vein.  Surgeon: Leotis Pain, MD.   Anesthesia: Local with moderate conscious sedation for approximately 30  minutes using 3 mg of Versed and 100 mcg of Fentanyl  Fluoroscopy time: less than 1 minute  Contrast used: 0  Estimated blood loss: Minimal  Indication for the procedure:  The patient is a 55 y.o.female with lung cancer.  The patient needs a Port-A-Cath for durable venous access, chemotherapy, lab draws, and CT scans. We are asked to place this. Risks and benefits were discussed and informed consent was obtained.  Description of procedure: The patient was brought to the vascular and interventional radiology suite.  Moderate conscious sedation was administered throughout the procedure with my supervision of the RN administering medicines and monitoring the patient's vital signs, pulse oximetry, telemetry and mental status throughout from the start of the procedure until the patient was taken to the recovery room. The right neck chest and shoulder were sterilely prepped and draped, and a sterile surgical field was created. Ultrasound was used to help visualize a patent right internal jugular vein. This was then accessed under direct ultrasound guidance without difficulty with the Seldinger needle and a permanent image was recorded. A J-wire was placed. After skin nick and dilatation, the peel-away sheath was then placed over the wire. I then anesthetized an area under the clavicle approximately 1-2 fingerbreadths. A transverse incision was created and an inferior pocket was created with electrocautery and blunt  dissection. The port was then brought onto the field, placed into the pocket and secured to the chest wall with 2 Prolene sutures. The catheter was connected to the port and tunneled from the subclavicular incision to the access site. Fluoroscopic guidance was then used to cut the catheter to an appropriate length. The catheter was then placed through the peel-away sheath and the peel-away sheath was removed. The catheter tip was parked in excellent location under fluorocoscopic guidance in the superior vena cava just above the right atrium. The pocket was then irrigated with antibiotic impregnated saline and the wound was closed with a running 3-0 Vicryl and a 4-0 Monocryl. The access incision was closed with a single 4-0 Monocryl. The Huber needle was used to withdraw blood and flush the port with heparinized saline. Dermabond was then placed as a dressing. The patient tolerated the procedure well and was taken to the recovery room in stable condition.   DEW,JASON 07/30/2015 1:25 PM

## 2015-07-30 NOTE — H&P (Signed)
  [facility] History & Physical Update  The patient was interviewed and re-examined.  The patient's previous History and Physical has been reviewed and is unchanged.  There is no change in the plan of care. We plan to proceed with the scheduled procedure.  Cleda Imel, MD  07/30/2015, 10:16 AM

## 2015-07-31 ENCOUNTER — Ambulatory Visit
Admission: RE | Admit: 2015-07-31 | Discharge: 2015-07-31 | Disposition: A | Discharge status: Home / Self Care | Payer: Medicaid Other | Source: Ambulatory Visit | Attending: Radiation Oncology | Admitting: Radiation Oncology

## 2015-07-31 ENCOUNTER — Encounter: Payer: Self-pay | Admitting: Vascular Surgery

## 2015-07-31 DIAGNOSIS — F1910 Other psychoactive substance abuse, uncomplicated: Secondary | ICD-10-CM | POA: Diagnosis not present

## 2015-07-31 DIAGNOSIS — F319 Bipolar disorder, unspecified: Secondary | ICD-10-CM | POA: Diagnosis not present

## 2015-07-31 DIAGNOSIS — J449 Chronic obstructive pulmonary disease, unspecified: Secondary | ICD-10-CM | POA: Diagnosis not present

## 2015-07-31 DIAGNOSIS — C3431 Malignant neoplasm of lower lobe, right bronchus or lung: Secondary | ICD-10-CM | POA: Diagnosis not present

## 2015-07-31 DIAGNOSIS — Z79899 Other long term (current) drug therapy: Secondary | ICD-10-CM | POA: Diagnosis not present

## 2015-07-31 DIAGNOSIS — Z510 Encounter for antineoplastic radiation therapy: Secondary | ICD-10-CM | POA: Diagnosis present

## 2015-08-01 ENCOUNTER — Inpatient Hospital Stay: Payer: Medicaid Other

## 2015-08-01 DIAGNOSIS — F411 Generalized anxiety disorder: Secondary | ICD-10-CM

## 2015-08-01 DIAGNOSIS — Z5111 Encounter for antineoplastic chemotherapy: Secondary | ICD-10-CM | POA: Diagnosis not present

## 2015-08-01 DIAGNOSIS — C3431 Malignant neoplasm of lower lobe, right bronchus or lung: Secondary | ICD-10-CM

## 2015-08-01 LAB — CBC WITH DIFFERENTIAL/PLATELET
Basophils Absolute: 0 10*3/uL (ref 0–0.1)
Basophils Relative: 1 %
EOS ABS: 0.1 10*3/uL (ref 0–0.7)
EOS PCT: 2 %
HCT: 31.7 % — ABNORMAL LOW (ref 35.0–47.0)
Hemoglobin: 10.7 g/dL — ABNORMAL LOW (ref 12.0–16.0)
LYMPHS ABS: 1.3 10*3/uL (ref 1.0–3.6)
Lymphocytes Relative: 39 %
MCH: 33 pg (ref 26.0–34.0)
MCHC: 33.8 g/dL (ref 32.0–36.0)
MCV: 97.8 fL (ref 80.0–100.0)
Monocytes Absolute: 0.3 10*3/uL (ref 0.2–0.9)
Monocytes Relative: 8 %
Neutro Abs: 1.7 10*3/uL (ref 1.4–6.5)
Neutrophils Relative %: 50 %
PLATELETS: 157 10*3/uL (ref 150–440)
RBC: 3.24 MIL/uL — AB (ref 3.80–5.20)
RDW: 13.8 % (ref 11.5–14.5)
WBC: 3.3 10*3/uL — AB (ref 3.6–11.0)

## 2015-08-02 ENCOUNTER — Ambulatory Visit: Payer: Self-pay | Admitting: Psychiatry

## 2015-08-02 DIAGNOSIS — Z510 Encounter for antineoplastic radiation therapy: Secondary | ICD-10-CM | POA: Diagnosis not present

## 2015-08-06 ENCOUNTER — Encounter: Payer: Self-pay | Admitting: Radiation Oncology

## 2015-08-08 ENCOUNTER — Inpatient Hospital Stay (HOSPITAL_BASED_OUTPATIENT_CLINIC_OR_DEPARTMENT_OTHER): Payer: Medicaid Other | Admitting: Oncology

## 2015-08-08 ENCOUNTER — Encounter: Payer: Self-pay | Admitting: Oncology

## 2015-08-08 ENCOUNTER — Inpatient Hospital Stay: Payer: Medicaid Other

## 2015-08-08 VITALS — BP 118/85 | HR 94 | Temp 97.0°F | Resp 18 | Wt 151.0 lb

## 2015-08-08 DIAGNOSIS — F319 Bipolar disorder, unspecified: Secondary | ICD-10-CM

## 2015-08-08 DIAGNOSIS — J449 Chronic obstructive pulmonary disease, unspecified: Secondary | ICD-10-CM

## 2015-08-08 DIAGNOSIS — C779 Secondary and unspecified malignant neoplasm of lymph node, unspecified: Secondary | ICD-10-CM

## 2015-08-08 DIAGNOSIS — R05 Cough: Secondary | ICD-10-CM

## 2015-08-08 DIAGNOSIS — Z90710 Acquired absence of both cervix and uterus: Secondary | ICD-10-CM

## 2015-08-08 DIAGNOSIS — M549 Dorsalgia, unspecified: Secondary | ICD-10-CM

## 2015-08-08 DIAGNOSIS — C3431 Malignant neoplasm of lower lobe, right bronchus or lung: Secondary | ICD-10-CM

## 2015-08-08 DIAGNOSIS — Z79899 Other long term (current) drug therapy: Secondary | ICD-10-CM

## 2015-08-08 DIAGNOSIS — Z87898 Personal history of other specified conditions: Secondary | ICD-10-CM

## 2015-08-08 DIAGNOSIS — F411 Generalized anxiety disorder: Secondary | ICD-10-CM

## 2015-08-08 DIAGNOSIS — F17210 Nicotine dependence, cigarettes, uncomplicated: Secondary | ICD-10-CM

## 2015-08-08 DIAGNOSIS — G8929 Other chronic pain: Secondary | ICD-10-CM

## 2015-08-08 DIAGNOSIS — R112 Nausea with vomiting, unspecified: Secondary | ICD-10-CM

## 2015-08-08 DIAGNOSIS — Z5111 Encounter for antineoplastic chemotherapy: Secondary | ICD-10-CM | POA: Diagnosis not present

## 2015-08-08 DIAGNOSIS — Z510 Encounter for antineoplastic radiation therapy: Secondary | ICD-10-CM | POA: Diagnosis not present

## 2015-08-08 LAB — CBC WITH DIFFERENTIAL/PLATELET
Basophils Absolute: 0 10*3/uL (ref 0–0.1)
Basophils Relative: 1 %
EOS PCT: 1 %
Eosinophils Absolute: 0.1 10*3/uL (ref 0–0.7)
HEMATOCRIT: 34 % — AB (ref 35.0–47.0)
HEMOGLOBIN: 11.6 g/dL — AB (ref 12.0–16.0)
LYMPHS PCT: 26 %
Lymphs Abs: 2.5 10*3/uL (ref 1.0–3.6)
MCH: 33 pg (ref 26.0–34.0)
MCHC: 34 g/dL (ref 32.0–36.0)
MCV: 97.1 fL (ref 80.0–100.0)
Monocytes Absolute: 0.7 10*3/uL (ref 0.2–0.9)
Monocytes Relative: 8 %
Neutro Abs: 6 10*3/uL (ref 1.4–6.5)
Neutrophils Relative %: 64 %
Platelets: 293 10*3/uL (ref 150–440)
RBC: 3.5 MIL/uL — AB (ref 3.80–5.20)
RDW: 14.1 % (ref 11.5–14.5)
WBC: 9.3 10*3/uL (ref 3.6–11.0)

## 2015-08-08 LAB — COMPREHENSIVE METABOLIC PANEL
ALK PHOS: 79 U/L (ref 38–126)
ALT: 10 U/L — AB (ref 14–54)
ANION GAP: 4 — AB (ref 5–15)
AST: 17 U/L (ref 15–41)
Albumin: 3.5 g/dL (ref 3.5–5.0)
BILIRUBIN TOTAL: 0.3 mg/dL (ref 0.3–1.2)
BUN: 13 mg/dL (ref 6–20)
CALCIUM: 8.5 mg/dL — AB (ref 8.9–10.3)
CO2: 27 mmol/L (ref 22–32)
CREATININE: 0.63 mg/dL (ref 0.44–1.00)
Chloride: 105 mmol/L (ref 101–111)
GFR calc non Af Amer: >60 mL/min (ref >60)
Glucose, Bld: 112 mg/dL — ABNORMAL HIGH (ref 65–99)
Potassium: 4.1 mmol/L (ref 3.5–5.1)
SODIUM: 136 mmol/L (ref 135–145)
TOTAL PROTEIN: 6.2 g/dL — AB (ref 6.5–8.1)

## 2015-08-08 LAB — MAGNESIUM: Magnesium: 2 mg/dL (ref 1.7–2.4)

## 2015-08-08 MED ORDER — SODIUM CHLORIDE 0.9 % IV SOLN
80.0000 mg/m2 | Freq: Once | INTRAVENOUS | Status: AC
  Administered 2015-08-08: 140 mg via INTRAVENOUS
  Filled 2015-08-08: qty 7

## 2015-08-08 MED ORDER — SODIUM CHLORIDE 0.9 % IV SOLN
Freq: Once | INTRAVENOUS | Status: AC
  Administered 2015-08-08: 10:00:00 via INTRAVENOUS
  Filled 2015-08-08: qty 1000

## 2015-08-08 MED ORDER — LIDOCAINE-PRILOCAINE 2.5-2.5 % EX CREA: 1.0000 "application " | TOPICAL_CREAM | CUTANEOUS | Status: DC | PRN

## 2015-08-08 MED ORDER — HEPARIN SOD (PORK) LOCK FLUSH 100 UNIT/ML IV SOLN
500.0000 [IU] | Freq: Once | INTRAVENOUS | Status: AC | PRN
Start: 2015-08-08 — End: 2015-08-08
  Administered 2015-08-08: 500 [IU]
  Filled 2015-08-08: qty 5

## 2015-08-08 MED ORDER — LORAZEPAM 2 MG/ML IJ SOLN
1.0000 mg | Freq: Once | INTRAMUSCULAR | Status: AC
  Administered 2015-08-08: 1 mg via INTRAVENOUS
  Filled 2015-08-08: qty 1

## 2015-08-08 MED ORDER — PALONOSETRON HCL INJECTION 0.25 MG/5ML
0.2500 mg | Freq: Once | INTRAVENOUS | Status: AC
  Administered 2015-08-08: 0.25 mg via INTRAVENOUS
  Filled 2015-08-08: qty 5

## 2015-08-08 MED ORDER — POTASSIUM CHLORIDE 2 MEQ/ML IV SOLN
Freq: Once | INTRAVENOUS | Status: AC
  Administered 2015-08-08: 10:00:00 via INTRAVENOUS
  Filled 2015-08-08: qty 1000

## 2015-08-08 MED ORDER — SODIUM CHLORIDE 0.9 % IV SOLN
70.0000 mg/m2 | Freq: Once | INTRAVENOUS | Status: AC
  Administered 2015-08-08: 125 mg via INTRAVENOUS
  Filled 2015-08-08: qty 125

## 2015-08-08 MED ORDER — OXYCODONE-ACETAMINOPHEN 5-325 MG PO TABS
1.0000 | ORAL_TABLET | Freq: Once | ORAL | Status: AC
  Administered 2015-08-08: 1 via ORAL
  Filled 2015-08-08: qty 1

## 2015-08-08 MED ORDER — FOSAPREPITANT DIMEGLUMINE INJECTION 150 MG
Freq: Once | INTRAVENOUS | Status: AC
  Administered 2015-08-08: 12:00:00 via INTRAVENOUS
  Filled 2015-08-08: qty 5

## 2015-08-08 NOTE — Progress Notes (Signed)
[place] @ [facility] [phone]  Fax:(336) [place]: 10-Oct-1960  MR#: [id number]  CHE#:[id number]  Patient Care Team: Jiles Garter, MD as PCP - General (Family Medicine)  CHIEF COMPLAINT:  Chief Complaint  Patient presents with  . 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  Needle aspiration of lymph node was positive for small cell carcinoma of lung. Clinically  Staged  As  T1 N2 M0 tumor.    2.PET scan shows localized disease.MRI of brain is negative for any metastases  3.  Chemotherapy with cis-platinum and VP-16 has been started on July 18, 2015 4.starting radiation therapy to the chest from August 23 2015  VISIT DIAGNOSIS:   No diagnosis found.    No history exists.    Oncology Flowsheet 07/18/2015 07/19/2015 07/20/2015 07/30/2015  Day, Cycle Day 1, Cycle 1 Day 2, Cycle 1 Day 3, Cycle 1 -  CISplatin (PLATINOL) IV 70 mg/m2 - - -  dexamethasone (DECADRON) IV [ 12 mg ] [ 10 mg ] [ 10 mg ] -  etoposide (VEPESID) IV 80 mg/m2 80 mg/m2 80 mg/m2 -  fosaprepitant (EMEND) IV [ 150 mg ] - - -  LORazepam (ATIVAN) IV 1 mg 1 mg 1 mg -  ondansetron (ZOFRAN) IV - [ 8 mg ] [ 8 mg ] 4 mg  palonosetron (ALOXI) IV 0.25 mg - - -    INTERVAL HISTORY: 55 year old lady with a multiple other comorbid condition.  Had the polysubstance abuse.  Bipolar disease.  COPD.  Chronic back pain and shoulder pain.  Mood disorder. Patient presented to emergency room at [facility] complaining of back pain left shoulder pain and had MRI scan done.  Patient complains of cough yellowish expectoration sometime blood stained sputum.  Sees very poor historian.  Extremely nervous and agitated lady.  Accompanied with his brother, Assistant continues to have chronic back pain neck pain.  Cough.  Occasional nausea and vomiting.   Patient continues to have  chronic pain.  Complaining of shoulder pain.  Tolerated last chemotherapy very well.  When to start radiation to the chest wall.  According to assist stop smoking.  She had an appointment initially with mental health but now has been changed to 9th of March. Had a port placement  REVIEW OF SYSTEMS:   Gen. status: Extremely agitated and anxious lady.  Not any acute distress. Lungs: Increasing shortness of breath.  Cough. HEENT: No headache no dizziness. GI: No nausea no vomiting but has lost some weight.  No diarrhea.  No hematemesis or melena Musculoskeletal system back pain and left shoulder pain. Patient has chronic pain.  Recently had MRI scan of left shoulder which is was revealing tendon tear. Cardiac: No chest pain no palpitation GU: No dysuria hematuria Skin: No rash Lower extremity no swelling Psychiatric system: Patient has bipolar disease.  Polysubstance abuse. Patient lives with her father.   As per HPI. Otherwise, a complete review of systems is negatve.  PAST MEDICAL HISTORY: Past Medical History  Diagnosis Date  . Primary cancer of right lower lobe of lung ([place]) 07/12/2015    Small cell undifferentiated carcinoma of lung.  Diagnosis at [facility] by fine-needle aspiration of lymph node (January, 2017)    PAST SURGICAL HISTORY: Abdominal hernia surgery. Hysterectomy.  Appendectomy.  FAMILY HISTORY  BROTHER HAD [place] AND HODGKIN'S  DISEASE.   OTHER SISTER HAD A BREAST CANCER.   GYNECOLOGIC HISTORY:    Patient  had a hysterectomy in the past    ADVANCED DIRECTIVES:    HEALTH MAINTENANCE: Social History  Substance Use Topics  . Smoking status: Former Smoker -- 0.50 packs/day for 30 years    Quit date: 06/17/2015  . Smokeless tobacco: None  . Alcohol Use: No       Allergies  Allergen Reactions  . Amoxicillin-Pot Clavulanate Hives    Also vomiting    Current Outpatient Prescriptions  Medication Sig Dispense Refill  . acetaminophen  (TYLENOL) 500 MG tablet Take 1,000 mg by mouth.    Marland Kitchen albuterol (PROVENTIL HFA;VENTOLIN HFA) 108 (90 Base) MCG/ACT inhaler Inhale into the lungs.    Marland Kitchen amLODipine (NORVASC) 5 MG tablet Take 5 mg by mouth.    . benzonatate (TESSALON PERLES) 100 MG capsule Take 100 mg by mouth.    . cyclobenzaprine (FLEXERIL) 10 MG tablet Take 10 mg by mouth.    . diclofenac (VOLTAREN) 75 MG EC tablet Take 75 mg by mouth.    . DULoxetine (CYMBALTA) 60 MG capsule Take 60 mg by mouth.    . fluticasone (FLONASE) 50 MCG/ACT nasal spray 1 spray by Each Nare route daily.    Marland Kitchen lidocaine (ASPERCREME W/LIDOCAINE) 4 % cream Apply 2 g topically Three (3) times a day as needed.    . lidocaine (XYLOCAINE) 5 % ointment Apply topically.    Marland Kitchen loratadine (CLARITIN) 10 MG tablet Take 10 mg by mouth.    Marland Kitchen LORazepam (ATIVAN) 1 MG tablet Take 1 tablet by mouth once 1 hour prior to scan 3 tablet 0  . nystatin (MYCOSTATIN) 100000 UNIT/ML suspension Take by mouth.    . ondansetron (ZOFRAN) 4 MG tablet Take 4 mg by mouth.    . oxyCODONE-acetaminophen (PERCOCET) 7.5-325 MG tablet Take 1 tablet by mouth every 4 (four) hours as needed for severe pain. 30 tablet 0  . Saline (AYR SALINE NASAL DROPS) 0.65 % (Soln) SOLN 2 drops by Each Nare route every four (4) hours as needed.     No current facility-administered medications for this visit.    OBJECTIVE: PHYSICAL EXAM: Gen. status: Extremely anxious lady. Head exam was generally normal. There was no scleral icterus or corneal arcus. Mucous membranes were moist. Examination of the throat is within normal limit Chest: Diminished air entry on both sides.  Occasional rhonchi.  Emphysema test chest. Cardiac: Tachycardia Abdominal exam revealed normal bowel sounds. The abdomen was soft, non-tender, and without masses, organomegaly, or appreciable enlargement of the abdominal aorta. Neurologically, the patient was awake, alert, and oriented to person, place and time. There were no obvious focal  neurologic abnormalities. Examination of the skin revealed no evidence of significant rashes, suspicious appearing nevi or other concerning lesions. Lower extremity no edema Lymphatic system: Supraclavicular, cervical, axillary, inguinal lymph nodes are not palpable   Filed Vitals:   08/08/15 0909  BP: 118/85  Pulse: 94  Temp: 97 F (36.1 C)  Resp: 18     Body mass index is 22.97 kg/(m^2).    ECOG FS:1 - Symptomatic but completely ambulatory  LAB RESULTS:  Lab data from outside institution has been reviewed  CT scan from the [facility] system has been reviewed independently.    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   2.patient had a port placement Proceed with chemotherapy second  cycle.  Patient was encouraged to keep appointment with mental health as well as management of pain medication by pain clinic because of chronic pain involved.   was encouraged to initiate radiation therapy  s   Patient expressed understanding and was in agreement with this plan. She also understands that She can call clinic at any time with any questions, concerns, or complaints.    Primary cancer of right lower lobe of lung ([place])   Staging form: Lung, AJCC 7th Edition     Clinical: T1, N2, M0 - Signed by Forest Gleason, MD on 07/12/2015   Forest Gleason, MD   08/08/2015 9:18 AM

## 2015-08-08 NOTE — Progress Notes (Signed)
Patient asking for pain medication when she gets to chemo.  States she is hurting in her shoulders.  Also requesting prescription for EMLA cream.  Further c/o pain in upper right quadrant that comes and goes.

## 2015-08-09 ENCOUNTER — Inpatient Hospital Stay: Payer: Medicaid Other

## 2015-08-09 ENCOUNTER — Ambulatory Visit
Admission: RE | Admit: 2015-08-09 | Discharge: 2015-08-09 | Disposition: A | Discharge status: Home / Self Care | Payer: Medicaid Other | Source: Ambulatory Visit | Attending: Radiation Oncology | Admitting: Radiation Oncology

## 2015-08-09 DIAGNOSIS — C3431 Malignant neoplasm of lower lobe, right bronchus or lung: Secondary | ICD-10-CM

## 2015-08-09 DIAGNOSIS — Z5111 Encounter for antineoplastic chemotherapy: Secondary | ICD-10-CM | POA: Diagnosis not present

## 2015-08-09 MED ORDER — SODIUM CHLORIDE 0.9 % IV SOLN: Freq: Once | INTRAVENOUS | Status: DC

## 2015-08-09 MED ORDER — HYDROCODONE-ACETAMINOPHEN 5-325 MG PO TABS
1.0000 | ORAL_TABLET | Freq: Once | ORAL | Status: AC
  Administered 2015-08-09: 1 via ORAL
  Filled 2015-08-09: qty 1

## 2015-08-09 MED ORDER — SODIUM CHLORIDE 0.9 % IV SOLN
Freq: Once | INTRAVENOUS | Status: AC
  Administered 2015-08-09: 15:00:00 via INTRAVENOUS
  Filled 2015-08-09: qty 1000

## 2015-08-09 MED ORDER — HEPARIN SOD (PORK) LOCK FLUSH 100 UNIT/ML IV SOLN
500.0000 [IU] | Freq: Once | INTRAVENOUS | Status: AC | PRN
  Administered 2015-08-09: 500 [IU]
  Filled 2015-08-09: qty 5

## 2015-08-09 MED ORDER — SODIUM CHLORIDE 0.9 % IV SOLN
Freq: Once | INTRAVENOUS | Status: AC
  Administered 2015-08-09: 15:00:00 via INTRAVENOUS
  Filled 2015-08-09: qty 4

## 2015-08-09 MED ORDER — SODIUM CHLORIDE 0.9% FLUSH
10.0000 mL | INTRAVENOUS | Status: DC | PRN
  Filled 2015-08-09: qty 10

## 2015-08-09 MED ORDER — LORAZEPAM 2 MG/ML IJ SOLN
1.0000 mg | Freq: Once | INTRAMUSCULAR | Status: AC
  Administered 2015-08-09: 1 mg via INTRAVENOUS
  Filled 2015-08-09: qty 1

## 2015-08-09 MED ORDER — SODIUM CHLORIDE 0.9 % IV SOLN
80.0000 mg/m2 | Freq: Once | INTRAVENOUS | Status: AC
  Administered 2015-08-09: 140 mg via INTRAVENOUS
  Filled 2015-08-09: qty 7

## 2015-08-09 MED ORDER — SODIUM CHLORIDE 0.9 % IV SOLN: 10.0000 mg | Freq: Once | INTRAVENOUS | Status: DC

## 2015-08-10 ENCOUNTER — Inpatient Hospital Stay: Payer: Medicaid Other

## 2015-08-10 ENCOUNTER — Encounter: Payer: Self-pay | Admitting: Radiation Oncology

## 2015-08-10 ENCOUNTER — Telehealth: Payer: Self-pay | Admitting: *Deleted

## 2015-08-10 VITALS — BP 143/84 | HR 83 | Temp 96.8°F | Resp 20

## 2015-08-10 DIAGNOSIS — Z5111 Encounter for antineoplastic chemotherapy: Secondary | ICD-10-CM | POA: Diagnosis not present

## 2015-08-10 DIAGNOSIS — C3431 Malignant neoplasm of lower lobe, right bronchus or lung: Secondary | ICD-10-CM

## 2015-08-10 MED ORDER — SODIUM CHLORIDE 0.9 % IV SOLN
Freq: Once | INTRAVENOUS | Status: AC
  Administered 2015-08-10: 15:00:00 via INTRAVENOUS
  Filled 2015-08-10: qty 1000

## 2015-08-10 MED ORDER — LORAZEPAM 2 MG/ML IJ SOLN
1.0000 mg | Freq: Once | INTRAMUSCULAR | Status: AC
  Administered 2015-08-10: 1 mg via INTRAVENOUS
  Filled 2015-08-10: qty 1

## 2015-08-10 MED ORDER — HEPARIN SOD (PORK) LOCK FLUSH 100 UNIT/ML IV SOLN
500.0000 [IU] | Freq: Once | INTRAVENOUS | Status: AC | PRN
  Administered 2015-08-10: 500 [IU]
  Filled 2015-08-10: qty 5

## 2015-08-10 MED ORDER — PALONOSETRON HCL INJECTION 0.25 MG/5ML
0.2500 mg | Freq: Once | INTRAVENOUS | Status: AC
  Administered 2015-08-10: 0.25 mg via INTRAVENOUS
  Filled 2015-08-10: qty 5

## 2015-08-10 MED ORDER — SODIUM CHLORIDE 0.9 % IV SOLN
80.0000 mg/m2 | Freq: Once | INTRAVENOUS | Status: AC
  Administered 2015-08-10: 140 mg via INTRAVENOUS
  Filled 2015-08-10: qty 7

## 2015-08-10 MED ORDER — SODIUM CHLORIDE 0.9 % IV SOLN
Freq: Once | INTRAVENOUS | Status: AC
  Administered 2015-08-10: 15:00:00 via INTRAVENOUS
  Filled 2015-08-10: qty 4

## 2015-08-10 MED ORDER — SODIUM CHLORIDE 0.9% FLUSH
10.0000 mL | INTRAVENOUS | Status: DC | PRN
  Administered 2015-08-10: 10 mL
  Filled 2015-08-10: qty 10

## 2015-08-10 NOTE — Telephone Encounter (Signed)
I confronted patient with this information and she still claims she did not pick it up, I told her the pharmacy said she showed her license to pick it up. I told her for this reason that our doctors here would probably not refill her pain meds and she responded,"I understand, that's ok, I go to Mental health next week and will get it then"

## 2015-08-10 NOTE — Progress Notes (Signed)
Pt vomiting in bathroom. Dr Grayland Ormond notified. Aloxi ordered

## 2015-08-10 NOTE — Telephone Encounter (Signed)
Per [name] at [facility], Charlene [name] did get Percocet 7.5-325 mg filled on 2/13 at [facility] per the [facility] controlled substance website. This is the prescription she claims she never got. I have called [facility] to confirm that it was filled an picked up by the patient herself because she had to show the her drivers license to pick it up.

## 2015-08-13 ENCOUNTER — Ambulatory Visit: Payer: Medicaid Other

## 2015-08-14 ENCOUNTER — Ambulatory Visit
Admission: RE | Admit: 2015-08-14 | Discharge: 2015-08-14 | Disposition: A | Discharge status: Home / Self Care | Payer: Medicaid Other | Source: Ambulatory Visit | Attending: Radiation Oncology | Admitting: Radiation Oncology

## 2015-08-14 DIAGNOSIS — Z510 Encounter for antineoplastic radiation therapy: Secondary | ICD-10-CM | POA: Diagnosis not present

## 2015-08-15 ENCOUNTER — Ambulatory Visit
Admission: RE | Admit: 2015-08-15 | Discharge: 2015-08-15 | Disposition: A | Discharge status: Home / Self Care | Payer: Medicaid Other | Source: Ambulatory Visit | Attending: Radiation Oncology | Admitting: Radiation Oncology

## 2015-08-15 ENCOUNTER — Inpatient Hospital Stay: Payer: Medicaid Other | Attending: Oncology

## 2015-08-15 DIAGNOSIS — Z5111 Encounter for antineoplastic chemotherapy: Secondary | ICD-10-CM | POA: Insufficient documentation

## 2015-08-15 DIAGNOSIS — Z510 Encounter for antineoplastic radiation therapy: Secondary | ICD-10-CM | POA: Diagnosis not present

## 2015-08-15 DIAGNOSIS — C342 Malignant neoplasm of middle lobe, bronchus or lung: Secondary | ICD-10-CM | POA: Insufficient documentation

## 2015-08-16 ENCOUNTER — Ambulatory Visit
Admission: RE | Admit: 2015-08-16 | Discharge: 2015-08-16 | Disposition: A | Discharge status: Home / Self Care | Payer: Medicaid Other | Source: Ambulatory Visit | Attending: Radiation Oncology | Admitting: Radiation Oncology

## 2015-08-16 DIAGNOSIS — Z510 Encounter for antineoplastic radiation therapy: Secondary | ICD-10-CM | POA: Diagnosis not present

## 2015-08-17 ENCOUNTER — Ambulatory Visit
Admission: RE | Admit: 2015-08-17 | Discharge: 2015-08-17 | Disposition: A | Discharge status: Home / Self Care | Payer: Medicaid Other | Source: Ambulatory Visit | Attending: Radiation Oncology | Admitting: Radiation Oncology

## 2015-08-17 ENCOUNTER — Telehealth: Payer: Self-pay | Admitting: *Deleted

## 2015-08-17 DIAGNOSIS — Z510 Encounter for antineoplastic radiation therapy: Secondary | ICD-10-CM | POA: Diagnosis not present

## 2015-08-17 MED ORDER — PROCHLORPERAZINE MALEATE 10 MG PO TABS: 10.0000 mg | ORAL_TABLET | Freq: Four times a day (QID) | ORAL | Status: DC | PRN

## 2015-08-17 NOTE — Telephone Encounter (Signed)
Compazine 10 mg e scribed per VO Dr Oliva Bustard, pt informed

## 2015-08-17 NOTE — Telephone Encounter (Signed)
C/o nausea and states that zofran is not working

## 2015-08-20 ENCOUNTER — Ambulatory Visit
Admission: RE | Admit: 2015-08-20 | Discharge: 2015-08-20 | Disposition: A | Discharge status: Home / Self Care | Payer: Medicaid Other | Source: Ambulatory Visit | Attending: Radiation Oncology | Admitting: Radiation Oncology

## 2015-08-20 DIAGNOSIS — Z510 Encounter for antineoplastic radiation therapy: Secondary | ICD-10-CM | POA: Diagnosis not present

## 2015-08-21 ENCOUNTER — Ambulatory Visit
Admission: RE | Admit: 2015-08-21 | Discharge: 2015-08-21 | Disposition: A | Discharge status: Home / Self Care | Payer: Medicaid Other | Source: Ambulatory Visit | Attending: Radiation Oncology | Admitting: Radiation Oncology

## 2015-08-21 ENCOUNTER — Encounter: Payer: Self-pay | Admitting: Radiation Oncology

## 2015-08-21 DIAGNOSIS — Z510 Encounter for antineoplastic radiation therapy: Secondary | ICD-10-CM | POA: Diagnosis not present

## 2015-08-22 ENCOUNTER — Ambulatory Visit
Admission: RE | Admit: 2015-08-22 | Discharge: 2015-08-22 | Disposition: A | Discharge status: Home / Self Care | Payer: Medicaid Other | Source: Ambulatory Visit | Attending: Radiation Oncology | Admitting: Radiation Oncology

## 2015-08-22 ENCOUNTER — Inpatient Hospital Stay: Payer: Medicaid Other

## 2015-08-22 DIAGNOSIS — Z510 Encounter for antineoplastic radiation therapy: Secondary | ICD-10-CM | POA: Diagnosis not present

## 2015-08-23 ENCOUNTER — Ambulatory Visit: Payer: Self-pay | Admitting: Psychiatry

## 2015-08-23 ENCOUNTER — Ambulatory Visit
Admission: RE | Admit: 2015-08-23 | Discharge: 2015-08-23 | Disposition: A | Discharge status: Home / Self Care | Payer: Medicaid Other | Source: Ambulatory Visit | Attending: Radiation Oncology | Admitting: Radiation Oncology

## 2015-08-23 DIAGNOSIS — Z510 Encounter for antineoplastic radiation therapy: Secondary | ICD-10-CM | POA: Diagnosis not present

## 2015-08-24 ENCOUNTER — Ambulatory Visit
Admission: RE | Admit: 2015-08-24 | Discharge: 2015-08-24 | Disposition: A | Discharge status: Home / Self Care | Payer: Medicaid Other | Source: Ambulatory Visit | Attending: Radiation Oncology | Admitting: Radiation Oncology

## 2015-08-24 DIAGNOSIS — Z510 Encounter for antineoplastic radiation therapy: Secondary | ICD-10-CM | POA: Diagnosis not present

## 2015-08-27 ENCOUNTER — Ambulatory Visit
Admission: RE | Admit: 2015-08-27 | Discharge: 2015-08-27 | Disposition: A | Discharge status: Home / Self Care | Payer: Medicaid Other | Source: Ambulatory Visit | Attending: Radiation Oncology | Admitting: Radiation Oncology

## 2015-08-27 DIAGNOSIS — Z510 Encounter for antineoplastic radiation therapy: Secondary | ICD-10-CM | POA: Diagnosis not present

## 2015-08-28 ENCOUNTER — Ambulatory Visit
Admission: RE | Admit: 2015-08-28 | Discharge: 2015-08-28 | Disposition: A | Discharge status: Home / Self Care | Payer: Medicaid Other | Source: Ambulatory Visit | Attending: Radiation Oncology | Admitting: Radiation Oncology

## 2015-08-28 DIAGNOSIS — Z510 Encounter for antineoplastic radiation therapy: Secondary | ICD-10-CM | POA: Diagnosis not present

## 2015-08-29 ENCOUNTER — Inpatient Hospital Stay: Payer: Medicaid Other | Attending: Oncology

## 2015-08-29 ENCOUNTER — Inpatient Hospital Stay: Payer: Self-pay | Attending: Oncology | Admitting: Oncology

## 2015-08-29 ENCOUNTER — Encounter: Payer: Self-pay | Admitting: Oncology

## 2015-08-29 ENCOUNTER — Ambulatory Visit
Admission: RE | Admit: 2015-08-29 | Discharge: 2015-08-29 | Disposition: A | Discharge status: Home / Self Care | Payer: Medicaid Other | Source: Ambulatory Visit | Attending: Radiation Oncology | Admitting: Radiation Oncology

## 2015-08-29 ENCOUNTER — Inpatient Hospital Stay: Payer: Self-pay | Attending: Oncology

## 2015-08-29 VITALS — BP 147/93 | HR 105 | Temp 97.1°F | Resp 18 | Wt 145.5 lb

## 2015-08-29 VITALS — BP 112/75 | HR 75 | Resp 20

## 2015-08-29 DIAGNOSIS — C3490 Malignant neoplasm of unspecified part of unspecified bronchus or lung: Secondary | ICD-10-CM

## 2015-08-29 DIAGNOSIS — Z5111 Encounter for antineoplastic chemotherapy: Secondary | ICD-10-CM | POA: Insufficient documentation

## 2015-08-29 DIAGNOSIS — R1310 Dysphagia, unspecified: Secondary | ICD-10-CM

## 2015-08-29 DIAGNOSIS — C3431 Malignant neoplasm of lower lobe, right bronchus or lung: Secondary | ICD-10-CM

## 2015-08-29 DIAGNOSIS — J029 Acute pharyngitis, unspecified: Secondary | ICD-10-CM

## 2015-08-29 DIAGNOSIS — M549 Dorsalgia, unspecified: Secondary | ICD-10-CM

## 2015-08-29 DIAGNOSIS — C342 Malignant neoplasm of middle lobe, bronchus or lung: Secondary | ICD-10-CM | POA: Diagnosis not present

## 2015-08-29 DIAGNOSIS — Z510 Encounter for antineoplastic radiation therapy: Secondary | ICD-10-CM | POA: Diagnosis not present

## 2015-08-29 DIAGNOSIS — Z79899 Other long term (current) drug therapy: Secondary | ICD-10-CM

## 2015-08-29 DIAGNOSIS — R112 Nausea with vomiting, unspecified: Secondary | ICD-10-CM

## 2015-08-29 DIAGNOSIS — F419 Anxiety disorder, unspecified: Secondary | ICD-10-CM

## 2015-08-29 DIAGNOSIS — J449 Chronic obstructive pulmonary disease, unspecified: Secondary | ICD-10-CM

## 2015-08-29 DIAGNOSIS — F17210 Nicotine dependence, cigarettes, uncomplicated: Secondary | ICD-10-CM

## 2015-08-29 DIAGNOSIS — F319 Bipolar disorder, unspecified: Secondary | ICD-10-CM

## 2015-08-29 LAB — COMPREHENSIVE METABOLIC PANEL
ALBUMIN: 3.5 g/dL (ref 3.5–5.0)
ALT: 15 U/L (ref 14–54)
ANION GAP: 6 (ref 5–15)
AST: 30 U/L (ref 15–41)
Alkaline Phosphatase: 72 U/L (ref 38–126)
BILIRUBIN TOTAL: 0.4 mg/dL (ref 0.3–1.2)
BUN: 22 mg/dL — AB (ref 6–20)
CHLORIDE: 102 mmol/L (ref 101–111)
CO2: 27 mmol/L (ref 22–32)
Calcium: 8.5 mg/dL — ABNORMAL LOW (ref 8.9–10.3)
Creatinine, Ser: 0.76 mg/dL (ref 0.44–1.00)
GFR calc Af Amer: >60 mL/min (ref >60)
GFR calc non Af Amer: >60 mL/min (ref >60)
GLUCOSE: 118 mg/dL — AB (ref 65–99)
POTASSIUM: 3.4 mmol/L — AB (ref 3.5–5.1)
Sodium: 135 mmol/L (ref 135–145)
TOTAL PROTEIN: 6.5 g/dL (ref 6.5–8.1)

## 2015-08-29 LAB — CBC WITH DIFFERENTIAL/PLATELET
BASOS ABS: 0 10*3/uL (ref 0–0.1)
BASOS PCT: 0 %
EOS ABS: 0.1 10*3/uL (ref 0–0.7)
EOS PCT: 3 %
HEMATOCRIT: 31.2 % — AB (ref 35.0–47.0)
Hemoglobin: 11 g/dL — ABNORMAL LOW (ref 12.0–16.0)
LYMPHS PCT: 24 %
Lymphs Abs: 1.4 10*3/uL (ref 1.0–3.6)
MCH: 34.1 pg — ABNORMAL HIGH (ref 26.0–34.0)
MCHC: 35.1 g/dL (ref 32.0–36.0)
MCV: 96.9 fL (ref 80.0–100.0)
MONO ABS: 1 10*3/uL — AB (ref 0.2–0.9)
Monocytes Relative: 17 %
NEUTROS ABS: 3.2 10*3/uL (ref 1.4–6.5)
NEUTROS PCT: 56 %
PLATELETS: 267 10*3/uL (ref 150–440)
RBC: 3.21 MIL/uL — ABNORMAL LOW (ref 3.80–5.20)
RDW: 14.7 % — AB (ref 11.5–14.5)
WBC: 5.7 10*3/uL (ref 3.6–11.0)

## 2015-08-29 LAB — MAGNESIUM: MAGNESIUM: 2 mg/dL (ref 1.7–2.4)

## 2015-08-29 MED ORDER — LORAZEPAM 2 MG/ML IJ SOLN
1.0000 mg | Freq: Once | INTRAMUSCULAR | Status: AC
  Administered 2015-08-29: 1 mg via INTRAVENOUS
  Filled 2015-08-29: qty 1

## 2015-08-29 MED ORDER — POTASSIUM CHLORIDE 2 MEQ/ML IV SOLN
Freq: Once | INTRAVENOUS | Status: AC
  Administered 2015-08-29: 11:00:00 via INTRAVENOUS
  Filled 2015-08-29: qty 1000

## 2015-08-29 MED ORDER — HEPARIN SOD (PORK) LOCK FLUSH 100 UNIT/ML IV SOLN
500.0000 [IU] | Freq: Once | INTRAVENOUS | Status: AC
  Administered 2015-08-29: 500 [IU] via INTRAVENOUS

## 2015-08-29 MED ORDER — PALONOSETRON HCL INJECTION 0.25 MG/5ML
0.2500 mg | Freq: Once | INTRAVENOUS | Status: AC
  Administered 2015-08-29: 0.25 mg via INTRAVENOUS
  Filled 2015-08-29: qty 5

## 2015-08-29 MED ORDER — SODIUM CHLORIDE 0.9 % IV SOLN
Freq: Once | INTRAVENOUS | Status: AC
  Administered 2015-08-29: 11:00:00 via INTRAVENOUS
  Filled 2015-08-29: qty 1000

## 2015-08-29 MED ORDER — HEPARIN SOD (PORK) LOCK FLUSH 100 UNIT/ML IV SOLN
500.0000 [IU] | Freq: Once | INTRAVENOUS | Status: DC | PRN
  Filled 2015-08-29: qty 5

## 2015-08-29 MED ORDER — HEPARIN SOD (PORK) LOCK FLUSH 100 UNIT/ML IV SOLN
INTRAVENOUS | Status: AC
  Filled 2015-08-29: qty 5

## 2015-08-29 MED ORDER — SODIUM CHLORIDE 0.9% FLUSH
10.0000 mL | INTRAVENOUS | Status: DC | PRN
  Administered 2015-08-29: 10 mL via INTRAVENOUS
  Filled 2015-08-29: qty 10

## 2015-08-29 MED ORDER — SODIUM CHLORIDE 0.9 % IV SOLN
70.0000 mg/m2 | Freq: Once | INTRAVENOUS | Status: AC
  Administered 2015-08-29: 125 mg via INTRAVENOUS
  Filled 2015-08-29: qty 125

## 2015-08-29 MED ORDER — SODIUM CHLORIDE 0.9 % IV SOLN
80.0000 mg/m2 | Freq: Once | INTRAVENOUS | Status: AC
  Administered 2015-08-29: 140 mg via INTRAVENOUS
  Filled 2015-08-29: qty 7

## 2015-08-29 MED ORDER — SODIUM CHLORIDE 0.9 % IV SOLN
Freq: Once | INTRAVENOUS | Status: AC
  Administered 2015-08-29: 13:00:00 via INTRAVENOUS
  Filled 2015-08-29: qty 5

## 2015-08-30 ENCOUNTER — Encounter: Payer: Self-pay | Admitting: Oncology

## 2015-08-30 ENCOUNTER — Ambulatory Visit
Admission: RE | Admit: 2015-08-30 | Discharge: 2015-08-30 | Disposition: A | Discharge status: Home / Self Care | Payer: Medicaid Other | Source: Ambulatory Visit | Attending: Radiation Oncology | Admitting: Radiation Oncology

## 2015-08-30 ENCOUNTER — Inpatient Hospital Stay: Payer: Medicaid Other

## 2015-08-30 DIAGNOSIS — C3431 Malignant neoplasm of lower lobe, right bronchus or lung: Secondary | ICD-10-CM

## 2015-08-30 DIAGNOSIS — Z5111 Encounter for antineoplastic chemotherapy: Secondary | ICD-10-CM | POA: Diagnosis not present

## 2015-08-30 DIAGNOSIS — C342 Malignant neoplasm of middle lobe, bronchus or lung: Secondary | ICD-10-CM | POA: Diagnosis not present

## 2015-08-30 DIAGNOSIS — Z510 Encounter for antineoplastic radiation therapy: Secondary | ICD-10-CM | POA: Diagnosis not present

## 2015-08-30 MED ORDER — SODIUM CHLORIDE 0.9 % IV SOLN
Freq: Once | INTRAVENOUS | Status: AC
  Administered 2015-08-30: 14:00:00 via INTRAVENOUS
  Filled 2015-08-30: qty 4

## 2015-08-30 MED ORDER — SODIUM CHLORIDE 0.9 % IV SOLN: 10.0000 mg | Freq: Once | INTRAVENOUS | Status: DC

## 2015-08-30 MED ORDER — SODIUM CHLORIDE 0.9 % IV SOLN
Freq: Once | INTRAVENOUS | Status: AC
  Administered 2015-08-30: 14:00:00 via INTRAVENOUS
  Filled 2015-08-30: qty 1000

## 2015-08-30 MED ORDER — SODIUM CHLORIDE 0.9 % IV SOLN: Freq: Once | INTRAVENOUS | Status: DC

## 2015-08-30 MED ORDER — HEPARIN SOD (PORK) LOCK FLUSH 100 UNIT/ML IV SOLN
500.0000 [IU] | Freq: Once | INTRAVENOUS | Status: AC | PRN
  Administered 2015-08-30: 500 [IU]

## 2015-08-30 MED ORDER — LORAZEPAM 2 MG/ML IJ SOLN
0.5000 mg | Freq: Once | INTRAMUSCULAR | Status: AC
  Administered 2015-08-30: 0.5 mg via INTRAVENOUS
  Filled 2015-08-30: qty 1

## 2015-08-30 MED ORDER — SODIUM CHLORIDE 0.9 % IV SOLN
80.0000 mg/m2 | Freq: Once | INTRAVENOUS | Status: AC
  Administered 2015-08-30: 140 mg via INTRAVENOUS
  Filled 2015-08-30: qty 7

## 2015-08-30 MED ORDER — FIRST-DUKES MOUTHWASH MT SUSP: OROMUCOSAL | Status: DC

## 2015-08-30 MED ORDER — HEPARIN SOD (PORK) LOCK FLUSH 100 UNIT/ML IV SOLN
INTRAVENOUS | Status: AC
  Filled 2015-08-30: qty 5

## 2015-08-30 NOTE — Progress Notes (Signed)
[place] @ [facility] [phone]) [phone]  Fax:(336) [place]: 30-Apr-1961  MR#: [id number]  UDJ#:[id number]  Patient Care Team: Jiles Garter, MD as PCP - General (Family Medicine)  CHIEF COMPLAINT:  Chief Complaint  Patient presents with  . 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  Needle aspiration of lymph node was positive for small cell carcinoma of lung. Clinically  Staged  As  T1 N2 M0 tumor.    2.PET scan shows localized disease.MRI of brain is negative for any metastases  3.  Chemotherapy with cis-platinum and VP-16 has been started on July 18, 2015 4.starting radiation therapy to the chest from August 23 2015  VISIT DIAGNOSIS:     ICD-9-CM ICD-10-CM   1. Small cell carcinoma of lung, unspecified laterality (HCC) 162.9 C34.90 CBC with Differential     Comprehensive metabolic panel     Magnesium      No history exists.    Oncology Flowsheet 07/19/2015 07/20/2015 07/30/2015 08/08/2015 08/09/2015 08/10/2015 08/29/2015  Day, Cycle Day 2, Cycle 1 Day 3, Cycle 1 - Day 1, Cycle 2 Day 2, Cycle 2 Day 3, Cycle 2 Day 1, Cycle 3  CISplatin (PLATINOL) IV - - - 70 mg/m2 - - 70 mg/m2  dexamethasone (DECADRON) IV [ 10 mg ] [ 10 mg ] - [ 12 mg ] [ 10 mg ] [ 10 mg ] [ 12 mg ]  etoposide (VEPESID) IV 80 mg/m2 80 mg/m2 - 80 mg/m2 80 mg/m2 80 mg/m2 80 mg/m2  fosaprepitant (EMEND) IV - - - [ 150 mg ] - - [ 150 mg ]  LORazepam (ATIVAN) IV 1 mg 1 mg - 1 mg 1 mg 1 mg 1 mg  ondansetron (ZOFRAN) IV [ 8 mg ] [ 8 mg ] 4 mg - [ 8 mg ] [ 8 mg ] -  palonosetron (ALOXI) IV - - - 0.25 mg - 0.25 mg 0.25 mg    INTERVAL HISTORY: 55 year old lady with a multiple other comorbid condition.  Had the polysubstance abuse.  Bipolar disease.  COPD.  Chronic back pain and shoulder pain.  Mood disorder. Patient presented to emergency room at [facility] complaining  of back pain left shoulder pain and had MRI scan done.  Patient complains of cough yellowish expectoration sometime blood stained sputum.  Sees very poor historian.  Extremely nervous and agitated lady.  Accompanied with his brother, Assistant continues to have chronic back pain neck pain.  Cough.  Occasional nausea and vomiting.  She is getting radiation and chemotherapy. 2.  No numbness.  Appetite remains stable.  Patient continues.  Severe anxiety depression and issues with chronic pain.  Patient is now being followed by mental health department regarding psychiatric issues Sore throat started a few days ago has difficulty speaking.  Some difficulty swallowing. REVIEW OF SYSTEMS:   Gen. status: Extremely agitated and anxious lady.  Not any acute distress. Lungs: Increasing shortness of breath.  Cough. HEENT: No headache no dizziness.  Sore throat as described above GI: No nausea no vomiting but has lost some weight.  No diarrhea.  No hematemesis or melena Musculoskeletal system back pain and left shoulder pain. Patient has chronic pain.  Recently had MRI scan of left shoulder which is was revealing tendon tear. Cardiac: No chest pain no palpitation GU: No dysuria hematuria Skin: No rash  Lower extremity no swelling Psychiatric system: Patient has bipolar disease.  Polysubstance abuse.  Now being followed by MENTAL  health department Patient lives with her father.   As per HPI. Otherwise, a complete review of systems is negatve.  PAST MEDICAL HISTORY: Past Medical History  Diagnosis Date  . Primary cancer of right lower lobe of lung ([place]) 07/12/2015    Small cell undifferentiated carcinoma of lung.  Diagnosis at [facility] by fine-needle aspiration of lymph node (January, 2017)    PAST SURGICAL HISTORY: Abdominal hernia surgery. Hysterectomy.  Appendectomy.  FAMILY HISTORY  BROTHER HAD CHOLANGIOCARCINOMA AND HODGKIN'S DISEASE.   OTHER SISTER HAD A BREAST CANCER.     GYNECOLOGIC HISTORY:    Patient  had a hysterectomy in the past    ADVANCED DIRECTIVES:    HEALTH MAINTENANCE: Social History  Substance Use Topics  . Smoking status: Former Smoker -- 0.50 packs/day for 30 years    Quit date: 06/17/2015  . Smokeless tobacco: None  . Alcohol Use: No       Allergies  Allergen Reactions  . Amoxicillin-Pot Clavulanate Hives    Also vomiting    Current Outpatient Prescriptions  Medication Sig Dispense Refill  . acetaminophen (TYLENOL) 500 MG tablet Take 1,000 mg by mouth.    Marland Kitchen albuterol (PROVENTIL HFA;VENTOLIN HFA) 108 (90 Base) MCG/ACT inhaler Inhale into the lungs.    Marland Kitchen amLODipine (NORVASC) 5 MG tablet Take 5 mg by mouth.    . benzonatate (TESSALON PERLES) 100 MG capsule Take 100 mg by mouth.    . cyclobenzaprine (FLEXERIL) 10 MG tablet Take 10 mg by mouth.    . diclofenac (VOLTAREN) 75 MG EC tablet Take 75 mg by mouth.    . DULoxetine (CYMBALTA) 60 MG capsule Take 60 mg by mouth.    . fluticasone (FLONASE) 50 MCG/ACT nasal spray 1 spray by Each Nare route daily.    Marland Kitchen lidocaine (ASPERCREME W/LIDOCAINE) 4 % cream Apply 2 g topically Three (3) times a day as needed.    . lidocaine (XYLOCAINE) 5 % ointment Apply topically.    . lidocaine-prilocaine (EMLA) cream Apply 1 application topically as needed. 30 g 3  . loratadine (CLARITIN) 10 MG tablet Take 10 mg by mouth.    Marland Kitchen LORazepam (ATIVAN) 1 MG tablet Take 1 tablet by mouth once 1 hour prior to scan 3 tablet 0  . nystatin (MYCOSTATIN) 100000 UNIT/ML suspension Take by mouth.    . ondansetron (ZOFRAN) 4 MG tablet Take 4 mg by mouth.    . oxyCODONE-acetaminophen (PERCOCET) 7.5-325 MG tablet Take 1 tablet by mouth every 4 (four) hours as needed for severe pain. 30 tablet 0  . prochlorperazine (COMPAZINE) 10 MG tablet Take 1 tablet (10 mg total) by mouth every 6 (six) hours as needed for nausea or vomiting. 30 tablet 0  . Saline (AYR SALINE NASAL DROPS) 0.65 % (Soln) SOLN 2 drops by Each Nare route  every four (4) hours as needed.     No current facility-administered medications for this visit.    OBJECTIVE: PHYSICAL EXAM: Gen. status: Extremely anxious lady. Head exam was generally normal. There was no scleral icterus or corneal arcus. Mucous membranes were moist. Examination of the throat is within normal limit Chest: Diminished air entry on both sides.  Occasional rhonchi.  Emphysema test chest. Cardiac: Tachycardia Abdominal exam revealed normal bowel sounds. The abdomen was soft, non-tender, and without masses, organomegaly, or appreciable enlargement of the abdominal aorta. Neurologically, the patient was awake,  alert, and oriented to person, place and time. There were no obvious focal neurologic abnormalities. Examination of the skin revealed no evidence of significant rashes, suspicious appearing nevi or other concerning lesions. Lower extremity no edema Lymphatic system: Supraclavicular, cervical, axillary, inguinal lymph nodes are not palpable   Filed Vitals:   08/29/15 0924  BP: 147/93  Pulse: 105  Temp: 97.1 F (36.2 C)  Resp: 18     Body mass index is 22.13 kg/(m^2).    ECOG FS:1 - Symptomatic but completely ambulatory  LAB RESULTS:  Lab data from outside institution has been reviewed  CT scan from the [facility] system has been reviewed independently.    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   2.patient had a port placement Proceed with chemotherapy THIRD CYCLE  Patient was encouraged to keep appointment with mental health as well as management of pain medication by pain clinic because of chronic pain involved. Mental health department is following patients bipolar disease since been reviewed Continue chemotherapy radiation therapy Sore throat is secondary to radiation therapy patient has started Carafate and with some improvement FTER 4 cycles of chemotherapy  patient will get a CT scan    s   Patient expressed understanding and was in agreement with this plan. She also understands that She can call clinic at any time with any questions, concerns, or complaints.    Primary cancer of right lower lobe of lung ([place])   Staging form: Lung, AJCC 7th Edition     Clinical: T1, N2, M0 - Signed by Forest Gleason, MD on 07/12/2015   Forest Gleason, MD   08/30/2015 11:00 AM

## 2015-08-31 ENCOUNTER — Other Ambulatory Visit: Payer: Self-pay | Admitting: *Deleted

## 2015-08-31 ENCOUNTER — Ambulatory Visit
Admission: RE | Admit: 2015-08-31 | Discharge: 2015-08-31 | Disposition: A | Discharge status: Home / Self Care | Payer: Medicaid Other | Source: Ambulatory Visit | Attending: Radiation Oncology | Admitting: Radiation Oncology

## 2015-08-31 ENCOUNTER — Inpatient Hospital Stay: Payer: Medicaid Other

## 2015-08-31 VITALS — BP 149/97 | HR 94 | Temp 97.7°F | Resp 20

## 2015-08-31 DIAGNOSIS — C3431 Malignant neoplasm of lower lobe, right bronchus or lung: Secondary | ICD-10-CM

## 2015-08-31 DIAGNOSIS — Z510 Encounter for antineoplastic radiation therapy: Secondary | ICD-10-CM | POA: Diagnosis not present

## 2015-08-31 DIAGNOSIS — Z5111 Encounter for antineoplastic chemotherapy: Secondary | ICD-10-CM | POA: Diagnosis not present

## 2015-08-31 MED ORDER — SODIUM CHLORIDE 0.9 % IV SOLN
Freq: Once | INTRAVENOUS | Status: AC
  Administered 2015-08-31: 14:00:00 via INTRAVENOUS
  Filled 2015-08-31: qty 1000

## 2015-08-31 MED ORDER — LORAZEPAM 2 MG/ML IJ SOLN
0.5000 mg | Freq: Once | INTRAMUSCULAR | Status: AC
  Administered 2015-08-31: 0.5 mg via INTRAVENOUS
  Filled 2015-08-31: qty 1

## 2015-08-31 MED ORDER — SODIUM CHLORIDE 0.9% FLUSH
10.0000 mL | INTRAVENOUS | Status: DC | PRN
  Administered 2015-08-31: 10 mL
  Filled 2015-08-31: qty 10

## 2015-08-31 MED ORDER — SODIUM CHLORIDE 0.9 % IV SOLN: Freq: Once | INTRAVENOUS | Status: DC

## 2015-08-31 MED ORDER — SODIUM CHLORIDE 0.9 % IV SOLN: 10.0000 mg | Freq: Once | INTRAVENOUS | Status: DC

## 2015-08-31 MED ORDER — PROCHLORPERAZINE MALEATE 10 MG PO TABS: 10.0000 mg | ORAL_TABLET | Freq: Four times a day (QID) | ORAL | Status: DC | PRN

## 2015-08-31 MED ORDER — HEPARIN SOD (PORK) LOCK FLUSH 100 UNIT/ML IV SOLN
500.0000 [IU] | Freq: Once | INTRAVENOUS | Status: AC | PRN
  Administered 2015-08-31: 500 [IU]
  Filled 2015-08-31: qty 5

## 2015-08-31 MED ORDER — SODIUM CHLORIDE 0.9 % IV SOLN
Freq: Once | INTRAVENOUS | Status: AC
  Administered 2015-08-31: 15:00:00 via INTRAVENOUS
  Filled 2015-08-31: qty 4

## 2015-08-31 MED ORDER — SODIUM CHLORIDE 0.9 % IV SOLN
80.0000 mg/m2 | Freq: Once | INTRAVENOUS | Status: AC
  Administered 2015-08-31: 140 mg via INTRAVENOUS
  Filled 2015-08-31: qty 7

## 2015-09-03 ENCOUNTER — Ambulatory Visit
Admission: RE | Admit: 2015-09-03 | Discharge: 2015-09-03 | Disposition: A | Discharge status: Home / Self Care | Payer: Medicaid Other | Source: Ambulatory Visit | Attending: Radiation Oncology | Admitting: Radiation Oncology

## 2015-09-03 DIAGNOSIS — Z510 Encounter for antineoplastic radiation therapy: Secondary | ICD-10-CM | POA: Diagnosis not present

## 2015-09-04 ENCOUNTER — Ambulatory Visit
Admission: RE | Admit: 2015-09-04 | Discharge: 2015-09-04 | Disposition: A | Discharge status: Home / Self Care | Payer: Medicaid Other | Source: Ambulatory Visit | Attending: Radiation Oncology | Admitting: Radiation Oncology

## 2015-09-04 DIAGNOSIS — Z510 Encounter for antineoplastic radiation therapy: Secondary | ICD-10-CM | POA: Diagnosis not present

## 2015-09-05 ENCOUNTER — Inpatient Hospital Stay: Payer: Medicaid Other

## 2015-09-05 ENCOUNTER — Ambulatory Visit: Payer: Medicaid Other

## 2015-09-06 ENCOUNTER — Ambulatory Visit
Admission: RE | Admit: 2015-09-06 | Discharge: 2015-09-06 | Disposition: A | Discharge status: Home / Self Care | Payer: Medicaid Other | Source: Ambulatory Visit | Attending: Radiation Oncology | Admitting: Radiation Oncology

## 2015-09-06 ENCOUNTER — Other Ambulatory Visit: Payer: Self-pay | Admitting: *Deleted

## 2015-09-06 DIAGNOSIS — Z510 Encounter for antineoplastic radiation therapy: Secondary | ICD-10-CM | POA: Diagnosis not present

## 2015-09-06 MED ORDER — GUAIFENESIN-CODEINE 100-10 MG/5ML PO SOLN: 5.0000 mL | Freq: Three times a day (TID) | ORAL | Status: DC | PRN

## 2015-09-07 ENCOUNTER — Ambulatory Visit: Admission: RE | Admit: 2015-09-07 | Payer: Medicaid Other | Source: Ambulatory Visit

## 2015-09-10 ENCOUNTER — Ambulatory Visit
Admission: RE | Admit: 2015-09-10 | Discharge: 2015-09-10 | Disposition: A | Discharge status: Home / Self Care | Payer: Medicaid Other | Source: Ambulatory Visit | Attending: Radiation Oncology | Admitting: Radiation Oncology

## 2015-09-10 DIAGNOSIS — Z510 Encounter for antineoplastic radiation therapy: Secondary | ICD-10-CM | POA: Diagnosis not present

## 2015-09-11 ENCOUNTER — Ambulatory Visit
Admission: RE | Admit: 2015-09-11 | Discharge: 2015-09-11 | Disposition: A | Discharge status: Home / Self Care | Payer: Medicaid Other | Source: Ambulatory Visit | Attending: Radiation Oncology | Admitting: Radiation Oncology

## 2015-09-11 DIAGNOSIS — Z510 Encounter for antineoplastic radiation therapy: Secondary | ICD-10-CM | POA: Diagnosis not present

## 2015-09-12 ENCOUNTER — Ambulatory Visit
Admission: RE | Admit: 2015-09-12 | Discharge: 2015-09-12 | Disposition: A | Discharge status: Home / Self Care | Payer: Medicaid Other | Source: Ambulatory Visit | Attending: Radiation Oncology | Admitting: Radiation Oncology

## 2015-09-12 ENCOUNTER — Inpatient Hospital Stay: Payer: Medicaid Other

## 2015-09-12 DIAGNOSIS — Z510 Encounter for antineoplastic radiation therapy: Secondary | ICD-10-CM | POA: Diagnosis not present

## 2015-09-13 ENCOUNTER — Ambulatory Visit
Admission: RE | Admit: 2015-09-13 | Discharge: 2015-09-13 | Disposition: A | Discharge status: Home / Self Care | Payer: Medicaid Other | Source: Ambulatory Visit | Attending: Radiation Oncology | Admitting: Radiation Oncology

## 2015-09-13 DIAGNOSIS — Z510 Encounter for antineoplastic radiation therapy: Secondary | ICD-10-CM | POA: Diagnosis not present

## 2015-09-14 ENCOUNTER — Ambulatory Visit
Admission: RE | Admit: 2015-09-14 | Discharge: 2015-09-14 | Disposition: A | Discharge status: Home / Self Care | Payer: Medicaid Other | Source: Ambulatory Visit | Attending: Radiation Oncology | Admitting: Radiation Oncology

## 2015-09-14 DIAGNOSIS — Z510 Encounter for antineoplastic radiation therapy: Secondary | ICD-10-CM | POA: Diagnosis not present

## 2015-09-17 ENCOUNTER — Ambulatory Visit
Admission: RE | Admit: 2015-09-17 | Discharge: 2015-09-17 | Disposition: A | Discharge status: Home / Self Care | Payer: Medicaid Other | Source: Ambulatory Visit | Attending: Radiation Oncology | Admitting: Radiation Oncology

## 2015-09-17 DIAGNOSIS — Z510 Encounter for antineoplastic radiation therapy: Secondary | ICD-10-CM | POA: Diagnosis not present

## 2015-09-18 ENCOUNTER — Ambulatory Visit
Admission: RE | Admit: 2015-09-18 | Discharge: 2015-09-18 | Disposition: A | Discharge status: Home / Self Care | Payer: Medicaid Other | Source: Ambulatory Visit | Attending: Radiation Oncology | Admitting: Radiation Oncology

## 2015-09-18 DIAGNOSIS — Z510 Encounter for antineoplastic radiation therapy: Secondary | ICD-10-CM | POA: Diagnosis not present

## 2015-09-19 ENCOUNTER — Ambulatory Visit
Admission: RE | Admit: 2015-09-19 | Discharge: 2015-09-19 | Disposition: A | Discharge status: Home / Self Care | Payer: Medicaid Other | Source: Ambulatory Visit | Attending: Radiation Oncology | Admitting: Radiation Oncology

## 2015-09-19 ENCOUNTER — Other Ambulatory Visit: Payer: Self-pay | Admitting: *Deleted

## 2015-09-19 ENCOUNTER — Inpatient Hospital Stay: Payer: Medicaid Other | Attending: Oncology

## 2015-09-19 DIAGNOSIS — R000 Tachycardia, unspecified: Secondary | ICD-10-CM | POA: Diagnosis not present

## 2015-09-19 DIAGNOSIS — F419 Anxiety disorder, unspecified: Secondary | ICD-10-CM | POA: Insufficient documentation

## 2015-09-19 DIAGNOSIS — C3431 Malignant neoplasm of lower lobe, right bronchus or lung: Secondary | ICD-10-CM | POA: Insufficient documentation

## 2015-09-19 DIAGNOSIS — M25519 Pain in unspecified shoulder: Secondary | ICD-10-CM | POA: Insufficient documentation

## 2015-09-19 DIAGNOSIS — F1910 Other psychoactive substance abuse, uncomplicated: Secondary | ICD-10-CM | POA: Insufficient documentation

## 2015-09-19 DIAGNOSIS — Z5111 Encounter for antineoplastic chemotherapy: Secondary | ICD-10-CM | POA: Diagnosis present

## 2015-09-19 DIAGNOSIS — Z510 Encounter for antineoplastic radiation therapy: Secondary | ICD-10-CM | POA: Diagnosis not present

## 2015-09-19 DIAGNOSIS — F319 Bipolar disorder, unspecified: Secondary | ICD-10-CM | POA: Diagnosis not present

## 2015-09-19 DIAGNOSIS — Z87891 Personal history of nicotine dependence: Secondary | ICD-10-CM | POA: Insufficient documentation

## 2015-09-19 DIAGNOSIS — R110 Nausea: Secondary | ICD-10-CM | POA: Insufficient documentation

## 2015-09-19 DIAGNOSIS — R451 Restlessness and agitation: Secondary | ICD-10-CM | POA: Insufficient documentation

## 2015-09-19 DIAGNOSIS — Z923 Personal history of irradiation: Secondary | ICD-10-CM | POA: Insufficient documentation

## 2015-09-19 DIAGNOSIS — Z79899 Other long term (current) drug therapy: Secondary | ICD-10-CM | POA: Insufficient documentation

## 2015-09-19 DIAGNOSIS — M545 Low back pain: Secondary | ICD-10-CM | POA: Diagnosis not present

## 2015-09-19 DIAGNOSIS — J449 Chronic obstructive pulmonary disease, unspecified: Secondary | ICD-10-CM | POA: Diagnosis not present

## 2015-09-19 DIAGNOSIS — C3490 Malignant neoplasm of unspecified part of unspecified bronchus or lung: Secondary | ICD-10-CM

## 2015-09-19 LAB — CBC WITH DIFFERENTIAL/PLATELET
BASOS PCT: 1 %
Basophils Absolute: 0 10*3/uL (ref 0–0.1)
EOS ABS: 0.2 10*3/uL (ref 0–0.7)
EOS PCT: 6 %
HCT: 32.5 % — ABNORMAL LOW (ref 35.0–47.0)
HEMOGLOBIN: 11.1 g/dL — AB (ref 12.0–16.0)
LYMPHS ABS: 0.8 10*3/uL — AB (ref 1.0–3.6)
Lymphocytes Relative: 22 %
MCH: 34.1 pg — AB (ref 26.0–34.0)
MCHC: 34.2 g/dL (ref 32.0–36.0)
MCV: 99.6 fL (ref 80.0–100.0)
Monocytes Absolute: 0.6 10*3/uL (ref 0.2–0.9)
Monocytes Relative: 16 %
NEUTROS PCT: 55 %
Neutro Abs: 2.1 10*3/uL (ref 1.4–6.5)
PLATELETS: 243 10*3/uL (ref 150–440)
RBC: 3.27 MIL/uL — AB (ref 3.80–5.20)
RDW: 15.9 % — ABNORMAL HIGH (ref 11.5–14.5)
WBC: 3.8 10*3/uL (ref 3.6–11.0)

## 2015-09-19 MED ORDER — GUAIFENESIN-CODEINE 100-10 MG/5ML PO SOLN: 5.0000 mL | Freq: Three times a day (TID) | ORAL | Status: DC | PRN

## 2015-09-20 ENCOUNTER — Ambulatory Visit
Admission: RE | Admit: 2015-09-20 | Discharge: 2015-09-20 | Disposition: A | Discharge status: Home / Self Care | Payer: Medicaid Other | Source: Ambulatory Visit | Attending: Radiation Oncology | Admitting: Radiation Oncology

## 2015-09-20 DIAGNOSIS — Z510 Encounter for antineoplastic radiation therapy: Secondary | ICD-10-CM | POA: Diagnosis not present

## 2015-09-21 ENCOUNTER — Other Ambulatory Visit: Payer: Self-pay | Admitting: *Deleted

## 2015-09-21 ENCOUNTER — Ambulatory Visit
Admission: RE | Admit: 2015-09-21 | Discharge: 2015-09-21 | Disposition: A | Discharge status: Home / Self Care | Payer: Medicaid Other | Source: Ambulatory Visit | Attending: Radiation Oncology | Admitting: Radiation Oncology

## 2015-09-21 DIAGNOSIS — Z510 Encounter for antineoplastic radiation therapy: Secondary | ICD-10-CM | POA: Diagnosis not present

## 2015-09-21 MED ORDER — OXYCODONE-ACETAMINOPHEN 7.5-325 MG PO TABS: 1.0000 | ORAL_TABLET | ORAL | Status: DC | PRN

## 2015-09-24 ENCOUNTER — Ambulatory Visit: Payer: Medicaid Other

## 2015-09-25 ENCOUNTER — Ambulatory Visit: Payer: Medicaid Other

## 2015-09-26 ENCOUNTER — Ambulatory Visit: Payer: Medicaid Other

## 2015-09-26 ENCOUNTER — Inpatient Hospital Stay: Payer: Medicaid Other

## 2015-09-26 DIAGNOSIS — Z5111 Encounter for antineoplastic chemotherapy: Secondary | ICD-10-CM | POA: Diagnosis not present

## 2015-09-26 DIAGNOSIS — C3490 Malignant neoplasm of unspecified part of unspecified bronchus or lung: Secondary | ICD-10-CM

## 2015-09-26 DIAGNOSIS — Z510 Encounter for antineoplastic radiation therapy: Secondary | ICD-10-CM | POA: Diagnosis not present

## 2015-09-26 LAB — CBC WITH DIFFERENTIAL/PLATELET
Basophils Absolute: 0.1 10*3/uL (ref 0–0.1)
Basophils Relative: 1 %
EOS ABS: 0.1 10*3/uL (ref 0–0.7)
EOS PCT: 2 %
HCT: 32.5 % — ABNORMAL LOW (ref 35.0–47.0)
Hemoglobin: 11 g/dL — ABNORMAL LOW (ref 12.0–16.0)
LYMPHS ABS: 0.8 10*3/uL — AB (ref 1.0–3.6)
LYMPHS PCT: 12 %
MCH: 34.3 pg — AB (ref 26.0–34.0)
MCHC: 33.9 g/dL (ref 32.0–36.0)
MCV: 101.1 fL — AB (ref 80.0–100.0)
MONOS PCT: 9 %
Monocytes Absolute: 0.6 10*3/uL (ref 0.2–0.9)
Neutro Abs: 5.2 10*3/uL (ref 1.4–6.5)
Neutrophils Relative %: 76 %
PLATELETS: 210 10*3/uL (ref 150–440)
RBC: 3.21 MIL/uL — ABNORMAL LOW (ref 3.80–5.20)
RDW: 15.8 % — ABNORMAL HIGH (ref 11.5–14.5)
WBC: 6.7 10*3/uL (ref 3.6–11.0)

## 2015-09-27 ENCOUNTER — Ambulatory Visit: Payer: Medicaid Other

## 2015-09-28 ENCOUNTER — Ambulatory Visit: Payer: Medicaid Other

## 2015-10-01 ENCOUNTER — Ambulatory Visit: Payer: Medicaid Other

## 2015-10-01 ENCOUNTER — Ambulatory Visit
Admission: RE | Admit: 2015-10-01 | Discharge: 2015-10-01 | Disposition: A | Discharge status: Home / Self Care | Payer: Medicaid Other | Source: Ambulatory Visit | Attending: Radiation Oncology | Admitting: Radiation Oncology

## 2015-10-02 ENCOUNTER — Other Ambulatory Visit: Payer: Self-pay | Admitting: *Deleted

## 2015-10-02 ENCOUNTER — Ambulatory Visit
Admission: RE | Admit: 2015-10-02 | Discharge: 2015-10-02 | Disposition: A | Discharge status: Home / Self Care | Payer: Medicaid Other | Source: Ambulatory Visit | Attending: Radiation Oncology | Admitting: Radiation Oncology

## 2015-10-02 DIAGNOSIS — Z510 Encounter for antineoplastic radiation therapy: Secondary | ICD-10-CM | POA: Diagnosis not present

## 2015-10-02 MED ORDER — GUAIFENESIN-CODEINE 100-10 MG/5ML PO SOLN: 5.0000 mL | Freq: Three times a day (TID) | ORAL | Status: DC | PRN

## 2015-10-03 ENCOUNTER — Inpatient Hospital Stay: Payer: Medicaid Other

## 2015-10-03 ENCOUNTER — Ambulatory Visit
Admission: RE | Admit: 2015-10-03 | Discharge: 2015-10-03 | Disposition: A | Discharge status: Home / Self Care | Payer: Medicaid Other | Source: Ambulatory Visit | Attending: Radiation Oncology | Admitting: Radiation Oncology

## 2015-10-03 ENCOUNTER — Inpatient Hospital Stay (HOSPITAL_BASED_OUTPATIENT_CLINIC_OR_DEPARTMENT_OTHER): Payer: Medicaid Other | Admitting: Oncology

## 2015-10-03 ENCOUNTER — Encounter: Payer: Self-pay | Admitting: Oncology

## 2015-10-03 ENCOUNTER — Ambulatory Visit: Payer: Medicaid Other

## 2015-10-03 VITALS — BP 120/83 | HR 93 | Temp 97.4°F | Resp 18 | Wt 142.2 lb

## 2015-10-03 DIAGNOSIS — Z87891 Personal history of nicotine dependence: Secondary | ICD-10-CM

## 2015-10-03 DIAGNOSIS — C3490 Malignant neoplasm of unspecified part of unspecified bronchus or lung: Secondary | ICD-10-CM

## 2015-10-03 DIAGNOSIS — R000 Tachycardia, unspecified: Secondary | ICD-10-CM

## 2015-10-03 DIAGNOSIS — M545 Low back pain: Secondary | ICD-10-CM

## 2015-10-03 DIAGNOSIS — F419 Anxiety disorder, unspecified: Secondary | ICD-10-CM

## 2015-10-03 DIAGNOSIS — Z5111 Encounter for antineoplastic chemotherapy: Secondary | ICD-10-CM | POA: Diagnosis not present

## 2015-10-03 DIAGNOSIS — R110 Nausea: Secondary | ICD-10-CM

## 2015-10-03 DIAGNOSIS — M25519 Pain in unspecified shoulder: Secondary | ICD-10-CM

## 2015-10-03 DIAGNOSIS — F319 Bipolar disorder, unspecified: Secondary | ICD-10-CM

## 2015-10-03 DIAGNOSIS — R451 Restlessness and agitation: Secondary | ICD-10-CM

## 2015-10-03 DIAGNOSIS — F1910 Other psychoactive substance abuse, uncomplicated: Secondary | ICD-10-CM

## 2015-10-03 DIAGNOSIS — Z923 Personal history of irradiation: Secondary | ICD-10-CM

## 2015-10-03 DIAGNOSIS — C3431 Malignant neoplasm of lower lobe, right bronchus or lung: Secondary | ICD-10-CM

## 2015-10-03 DIAGNOSIS — J449 Chronic obstructive pulmonary disease, unspecified: Secondary | ICD-10-CM

## 2015-10-03 DIAGNOSIS — Z79899 Other long term (current) drug therapy: Secondary | ICD-10-CM

## 2015-10-03 LAB — COMPREHENSIVE METABOLIC PANEL
ALBUMIN: 4 g/dL (ref 3.5–5.0)
ALT: 9 U/L — AB (ref 14–54)
AST: 20 U/L (ref 15–41)
Alkaline Phosphatase: 71 U/L (ref 38–126)
Anion gap: 3 — ABNORMAL LOW (ref 5–15)
BUN: 8 mg/dL (ref 6–20)
CHLORIDE: 106 mmol/L (ref 101–111)
CO2: 28 mmol/L (ref 22–32)
CREATININE: 0.69 mg/dL (ref 0.44–1.00)
Calcium: 8.7 mg/dL — ABNORMAL LOW (ref 8.9–10.3)
GFR calc Af Amer: >60 mL/min (ref >60)
GLUCOSE: 86 mg/dL (ref 65–99)
POTASSIUM: 3.7 mmol/L (ref 3.5–5.1)
SODIUM: 137 mmol/L (ref 135–145)
Total Bilirubin: 0.4 mg/dL (ref 0.3–1.2)
Total Protein: 6.5 g/dL (ref 6.5–8.1)

## 2015-10-03 LAB — CBC WITH DIFFERENTIAL/PLATELET
BASOS ABS: 0.1 10*3/uL (ref 0–0.1)
BASOS PCT: 1 %
EOS PCT: 4 %
Eosinophils Absolute: 0.4 10*3/uL (ref 0–0.7)
HCT: 32.8 % — ABNORMAL LOW (ref 35.0–47.0)
Hemoglobin: 11.4 g/dL — ABNORMAL LOW (ref 12.0–16.0)
LYMPHS PCT: 9 %
Lymphs Abs: 0.8 10*3/uL — ABNORMAL LOW (ref 1.0–3.6)
MCH: 35 pg — ABNORMAL HIGH (ref 26.0–34.0)
MCHC: 34.9 g/dL (ref 32.0–36.0)
MCV: 100.3 fL — AB (ref 80.0–100.0)
Monocytes Absolute: 0.7 10*3/uL (ref 0.2–0.9)
Monocytes Relative: 8 %
Neutro Abs: 6.6 10*3/uL — ABNORMAL HIGH (ref 1.4–6.5)
Neutrophils Relative %: 78 %
PLATELETS: 229 10*3/uL (ref 150–440)
RBC: 3.27 MIL/uL — AB (ref 3.80–5.20)
RDW: 15.8 % — ABNORMAL HIGH (ref 11.5–14.5)
WBC: 8.5 10*3/uL (ref 3.6–11.0)

## 2015-10-03 LAB — MAGNESIUM: Magnesium: 2.1 mg/dL (ref 1.7–2.4)

## 2015-10-03 MED ORDER — POTASSIUM CHLORIDE 2 MEQ/ML IV SOLN
Freq: Once | INTRAVENOUS | Status: AC
  Administered 2015-10-03: 11:00:00 via INTRAVENOUS
  Filled 2015-10-03: qty 1000

## 2015-10-03 MED ORDER — SODIUM CHLORIDE 0.9 % IV SOLN
80.0000 mg/m2 | Freq: Once | INTRAVENOUS | Status: AC
  Administered 2015-10-03: 140 mg via INTRAVENOUS
  Filled 2015-10-03: qty 7

## 2015-10-03 MED ORDER — LORAZEPAM 2 MG/ML IJ SOLN
0.5000 mg | Freq: Once | INTRAMUSCULAR | Status: AC
  Administered 2015-10-03: 0.5 mg via INTRAVENOUS
  Filled 2015-10-03: qty 1

## 2015-10-03 MED ORDER — PALONOSETRON HCL INJECTION 0.25 MG/5ML
0.2500 mg | Freq: Once | INTRAVENOUS | Status: AC
  Administered 2015-10-03: 0.25 mg via INTRAVENOUS
  Filled 2015-10-03: qty 5

## 2015-10-03 MED ORDER — SODIUM CHLORIDE 0.9 % IV SOLN
Freq: Once | INTRAVENOUS | Status: AC
  Administered 2015-10-03: 11:00:00 via INTRAVENOUS
  Filled 2015-10-03: qty 1000

## 2015-10-03 MED ORDER — SODIUM CHLORIDE 0.9 % IV SOLN
Freq: Once | INTRAVENOUS | Status: AC
  Administered 2015-10-03: 13:00:00 via INTRAVENOUS
  Filled 2015-10-03: qty 5

## 2015-10-03 MED ORDER — SODIUM CHLORIDE 0.9 % IV SOLN
70.0000 mg/m2 | Freq: Once | INTRAVENOUS | Status: AC
  Administered 2015-10-03: 125 mg via INTRAVENOUS
  Filled 2015-10-03: qty 125

## 2015-10-03 MED ORDER — HEPARIN SOD (PORK) LOCK FLUSH 100 UNIT/ML IV SOLN
500.0000 [IU] | Freq: Once | INTRAVENOUS | Status: AC
  Administered 2015-10-03: 500 [IU] via INTRAVENOUS

## 2015-10-03 MED ORDER — SODIUM CHLORIDE 0.9% FLUSH
10.0000 mL | INTRAVENOUS | Status: DC | PRN
  Filled 2015-10-03: qty 10

## 2015-10-03 NOTE — Progress Notes (Signed)
Patient continues to have cough and fatigue.

## 2015-10-04 ENCOUNTER — Ambulatory Visit
Admission: RE | Admit: 2015-10-04 | Discharge: 2015-10-04 | Disposition: A | Discharge status: Home / Self Care | Payer: Medicaid Other | Source: Ambulatory Visit | Attending: Radiation Oncology | Admitting: Radiation Oncology

## 2015-10-04 ENCOUNTER — Inpatient Hospital Stay: Payer: Medicaid Other

## 2015-10-04 VITALS — BP 106/72 | HR 100 | Temp 97.0°F | Resp 18

## 2015-10-04 DIAGNOSIS — Z5111 Encounter for antineoplastic chemotherapy: Secondary | ICD-10-CM | POA: Diagnosis not present

## 2015-10-04 DIAGNOSIS — C3431 Malignant neoplasm of lower lobe, right bronchus or lung: Secondary | ICD-10-CM

## 2015-10-04 DIAGNOSIS — Z510 Encounter for antineoplastic radiation therapy: Secondary | ICD-10-CM | POA: Diagnosis not present

## 2015-10-04 MED ORDER — LORAZEPAM 2 MG/ML IJ SOLN
0.5000 mg | Freq: Once | INTRAMUSCULAR | Status: AC
Start: 2015-10-04 — End: 2015-10-04
  Administered 2015-10-04: 0.5 mg via INTRAVENOUS
  Filled 2015-10-04: qty 1

## 2015-10-04 MED ORDER — PROCHLORPERAZINE MALEATE 10 MG PO TABS: 10.0000 mg | ORAL_TABLET | Freq: Four times a day (QID) | ORAL | Status: DC | PRN

## 2015-10-04 MED ORDER — HEPARIN SOD (PORK) LOCK FLUSH 100 UNIT/ML IV SOLN
500.0000 [IU] | Freq: Once | INTRAVENOUS | Status: AC | PRN
  Administered 2015-10-04: 500 [IU]
  Filled 2015-10-04: qty 5

## 2015-10-04 MED ORDER — ETOPOSIDE CHEMO INJECTION 1 GM/50ML
80.0000 mg/m2 | Freq: Once | INTRAVENOUS | Status: AC
Start: 2015-10-04 — End: 2015-10-04
  Administered 2015-10-04: 140 mg via INTRAVENOUS
  Filled 2015-10-04: qty 7

## 2015-10-04 MED ORDER — SODIUM CHLORIDE 0.9 % IV SOLN
Freq: Once | INTRAVENOUS | Status: AC
  Administered 2015-10-04: 14:00:00 via INTRAVENOUS
  Filled 2015-10-04: qty 4

## 2015-10-04 MED ORDER — SODIUM CHLORIDE 0.9 % IV SOLN: Freq: Once | INTRAVENOUS | Status: DC

## 2015-10-04 MED ORDER — SODIUM CHLORIDE 0.9 % IV SOLN
Freq: Once | INTRAVENOUS | Status: AC
  Administered 2015-10-04: 14:00:00 via INTRAVENOUS
  Filled 2015-10-04: qty 1000

## 2015-10-04 MED ORDER — HEPARIN SOD (PORK) LOCK FLUSH 100 UNIT/ML IV SOLN
INTRAVENOUS | Status: AC
  Filled 2015-10-04: qty 5

## 2015-10-04 MED ORDER — SODIUM CHLORIDE 0.9 % IV SOLN: 10.0000 mg | Freq: Once | INTRAVENOUS | Status: DC

## 2015-10-05 ENCOUNTER — Inpatient Hospital Stay: Payer: Medicaid Other

## 2015-10-05 DIAGNOSIS — Z5111 Encounter for antineoplastic chemotherapy: Secondary | ICD-10-CM | POA: Diagnosis not present

## 2015-10-05 DIAGNOSIS — C3431 Malignant neoplasm of lower lobe, right bronchus or lung: Secondary | ICD-10-CM

## 2015-10-05 MED ORDER — HEPARIN SOD (PORK) LOCK FLUSH 100 UNIT/ML IV SOLN
500.0000 [IU] | Freq: Once | INTRAVENOUS | Status: AC | PRN
  Administered 2015-10-05: 500 [IU]
  Filled 2015-10-05: qty 5

## 2015-10-05 MED ORDER — SODIUM CHLORIDE 0.9 % IV SOLN
Freq: Once | INTRAVENOUS | Status: AC
  Administered 2015-10-05: 14:00:00 via INTRAVENOUS
  Filled 2015-10-05: qty 4

## 2015-10-05 MED ORDER — SODIUM CHLORIDE 0.9 % IV SOLN: 10.0000 mg | Freq: Once | INTRAVENOUS | Status: DC

## 2015-10-05 MED ORDER — SODIUM CHLORIDE 0.9 % IV SOLN
80.0000 mg/m2 | Freq: Once | INTRAVENOUS | Status: AC
  Administered 2015-10-05: 140 mg via INTRAVENOUS
  Filled 2015-10-05: qty 7

## 2015-10-05 MED ORDER — SODIUM CHLORIDE 0.9 % IV SOLN
Freq: Once | INTRAVENOUS | Status: AC
  Administered 2015-10-05: 14:00:00 via INTRAVENOUS
  Filled 2015-10-05: qty 1000

## 2015-10-05 MED ORDER — SODIUM CHLORIDE 0.9% FLUSH
10.0000 mL | INTRAVENOUS | Status: DC | PRN
  Administered 2015-10-05: 10 mL
  Filled 2015-10-05: qty 10

## 2015-10-05 MED ORDER — LORAZEPAM 2 MG/ML IJ SOLN
0.5000 mg | Freq: Once | INTRAMUSCULAR | Status: AC
  Administered 2015-10-05: 0.5 mg via INTRAVENOUS
  Filled 2015-10-05: qty 1

## 2015-10-08 ENCOUNTER — Encounter: Payer: Self-pay | Admitting: Oncology

## 2015-10-08 NOTE — Progress Notes (Signed)
[place] @ [facility] [phone]  Fax:(336) [place]: 11/08/1960  MR#: [id number]  QPR#:[id number]  Patient Care Team: Jiles Garter, MD as PCP - General (Family Medicine)  CHIEF COMPLAINT:  Chief Complaint  Patient presents with  . 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  Needle aspiration of lymph node was positive for small cell carcinoma of lung. Clinically  Staged  As  T1 N2 M0 tumor.    2.PET scan shows localized disease.MRI of brain is negative for any metastases  3.  Chemotherapy with cis-platinum and VP-16 has been started on July 18, 2015 4.starting radiation therapy to the chest from August 23 2015 .  5.Last chemotherapy (fourth cycle) October 03, 2015 patient has finished radiation therapy VISIT DIAGNOSIS:     ICD-9-CM ICD-10-CM   1. Small cell carcinoma of lung, unspecified laterality (HCC) 162.9 C34.90 albuterol (PROVENTIL HFA) 108 (90 Base) MCG/ACT inhaler     Comprehensive metabolic panel     Magnesium     CT Abdomen W Contrast     CT Chest W Contrast      No history exists.    Oncology Flowsheet 08/10/2015 08/29/2015 08/30/2015 08/31/2015 10/03/2015 10/04/2015 10/05/2015  Day, Cycle Day 3, Cycle 2 Day 1, Cycle 3 Day 2, Cycle 3 Day 3, Cycle 3 Day 1, Cycle 4 Day 2, Cycle 4 Day 3, Cycle 4  CISplatin (PLATINOL) IV - 70 mg/m2 - - 70 mg/m2 - -  dexamethasone (DECADRON) IV [ 10 mg ] [ 12 mg ] [ 10 mg ] [ 10 mg ] [ 12 mg ] [ 10 mg ] [ 10 mg ]  etoposide (VEPESID) IV 80 mg/m2 80 mg/m2 80 mg/m2 80 mg/m2 80 mg/m2 80 mg/m2 80 mg/m2  fosaprepitant (EMEND) IV - [ 150 mg ] - - [ 150 mg ] - -  LORazepam (ATIVAN) IV 1 mg 1 mg 0.5 mg 0.5 mg 0.5 mg 0.5 mg 0.5 mg  ondansetron (ZOFRAN) IV [ 8 mg ] - [ 8 mg ] [ 8 mg ] - [ 8 mg ] [ 8 mg ]  palonosetron (ALOXI) IV 0.25 mg 0.25 mg - - 0.25 mg - -    INTERVAL HISTORY:  55 year old lady with a multiple other comorbid condition.  Had the polysubstance abuse.  Bipolar disease.  COPD.  Chronic back pain and shoulder pain.  Mood disorder. Patient presented to emergency room at [facility] complaining of back pain left shoulder pain and had MRI scan done.  Patient complains of cough yellowish expectoration sometime blood stained sputum.  Sees very poor historian.  Extremely nervous and agitated lady.  Accompanied with his brother, Assistant continues to have chronic back pain neck pain.  Cough.  Occasional nausea and vomiting.  Patient is here for further evaluation and treatment consideration.  Initiating fourth and the last cycle of chemotherapy.  Tolerating treatment very well.  Mild nausea.  Some discomfort.  Has a chronic back pain.  No tingling.  No numbness.  No soreness of mouth. REVIEW OF SYSTEMS:   Gen. status: Extremely agitated and anxious lady.  Not any acute distress. Lungs: Increasing shortness of breath.  Cough. HEENT: No headache no dizziness.  Sore throat as described above GI: No nausea no vomiting but has lost some weight.  No diarrhea.  No hematemesis or melena Musculoskeletal system  back pain and left shoulder pain. Patient has chronic pain.  Recently had MRI scan of left shoulder which is was revealing tendon tear. Cardiac: No chest pain no palpitation GU: No dysuria hematuria Skin: No rash Lower extremity no swelling Psychiatric system: Patient has bipolar disease.  Polysubstance abuse.  Now being followed by [facility]  health department Patient lives with her father.   As per HPI. Otherwise, a complete review of systems is negatve.  PAST MEDICAL HISTORY: Past Medical History  Diagnosis Date  . Primary cancer of right lower lobe of lung ([place]) 07/12/2015    Small cell undifferentiated carcinoma of lung.  Diagnosis at [facility] by fine-needle aspiration of lymph node (January, 2017)    PAST SURGICAL HISTORY: Abdominal hernia  surgery. Hysterectomy.  Appendectomy.  FAMILY HISTORY  BROTHER HAD CHOLANGIOCARCINOMA AND HODGKIN'S DISEASE.   OTHER SISTER HAD A BREAST CANCER.   GYNECOLOGIC HISTORY:    Patient  had a hysterectomy in the past    ADVANCED DIRECTIVES:    HEALTH MAINTENANCE: Social History  Substance Use Topics  . Smoking status: Former Smoker -- 0.50 packs/day for 30 years    Quit date: 06/17/2015  . Smokeless tobacco: None  . Alcohol Use: No       Allergies  Allergen Reactions  . Amoxicillin-Pot Clavulanate Hives    Also vomiting    Current Outpatient Prescriptions  Medication Sig Dispense Refill  . acetaminophen (TYLENOL) 500 MG tablet Take 1,000 mg by mouth.    Marland Kitchen albuterol (PROVENTIL HFA) 108 (90 Base) MCG/ACT inhaler     . albuterol (PROVENTIL HFA;VENTOLIN HFA) 108 (90 Base) MCG/ACT inhaler Inhale into the lungs.    Marland Kitchen amLODipine (NORVASC) 5 MG tablet Take 5 mg by mouth.    . benzonatate (TESSALON PERLES) 100 MG capsule Take 100 mg by mouth.    . cyclobenzaprine (FLEXERIL) 10 MG tablet Take 10 mg by mouth.    . diclofenac (VOLTAREN) 75 MG EC tablet Take 75 mg by mouth.    . Diphenhyd-Hydrocort-Nystatin (FIRST-DUKES MOUTHWASH) SUSP Take 20m by mouth, swish and swallow three times a day 237 mL 3  . DULoxetine (CYMBALTA) 60 MG capsule Take 60 mg by mouth.    . fluticasone (FLONASE) 50 MCG/ACT nasal spray 1 spray by Each Nare route daily.    .Marland KitchenguaiFENesin-codeine 100-10 MG/5ML syrup Take 5 mLs by mouth 3 (three) times daily as needed for cough. 240 mL 0  . lidocaine (ASPERCREME W/LIDOCAINE) 4 % cream Apply 2 g topically Three (3) times a day as needed.    . lidocaine (XYLOCAINE) 5 % ointment Apply topically.    . lidocaine-prilocaine (EMLA) cream Apply 1 application topically as needed. 30 g 3  . loratadine (CLARITIN) 10 MG tablet Take 10 mg by mouth.    .Marland KitchenLORazepam (ATIVAN) 1 MG tablet Take 1 tablet by mouth once 1 hour prior to scan 3 tablet 0  . nystatin (MYCOSTATIN) 100000 UNIT/ML  suspension Take by mouth.    . ondansetron (ZOFRAN) 4 MG tablet Take 4 mg by mouth.    . oxyCODONE-acetaminophen (PERCOCET) 7.5-325 MG tablet Take 1 tablet by mouth every 4 (four) hours as needed for severe pain. 30 tablet 0  . Saline (AYR SALINE NASAL DROPS) 0.65 % (Soln) SOLN 2 drops by Each Nare route every four (4) hours as needed.    . prochlorperazine (COMPAZINE) 10 MG tablet Take 1 tablet (10 mg total) by mouth every 6 (six) hours as needed for nausea or vomiting.  30 tablet 3   No current facility-administered medications for this visit.    OBJECTIVE: PHYSICAL EXAM: Gen. status: Extremely anxious lady. Head exam was generally normal. There was no scleral icterus or corneal arcus. Mucous membranes were moist. Examination of the throat is within normal limit Chest: Diminished air entry on both sides.  Occasional rhonchi.  Emphysema test chest. Cardiac: Tachycardia Abdominal exam revealed normal bowel sounds. The abdomen was soft, non-tender, and without masses, organomegaly, or appreciable enlargement of the abdominal aorta. Neurologically, the patient was awake, alert, and oriented to person, place and time. There were no obvious focal neurologic abnormalities. Examination of the skin revealed no evidence of significant rashes, suspicious appearing nevi or other concerning lesions. Lower extremity no edema Lymphatic system: Supraclavicular, cervical, axillary, inguinal lymph nodes are not palpable   Filed Vitals:   10/03/15 0941  BP: 120/83  Pulse: 93  Temp: 97.4 F (36.3 C)  Resp: 18     Body mass index is 21.63 kg/(m^2).    ECOG FS:1 - Symptomatic but completely ambulatory  LAB RESULTS:  Lab data from outside institution has been reviewed  CT scan from the [facility] system has been reviewed independently.    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   Patient has starting  fourth cycle of chemotherapy.  Has finished chest wall radiation.  After this treatment patient will get a CT scan of the chest as well as brain scan and possibility of prophylactic cranial radiation would be considered.  The patient does not have a good response then a few more cycles of chemotherapy will be considered prior to prophylactic cranial radiation treatment    s   Patient expressed understanding and was in agreement with this plan. She also understands that She can call clinic at any time with any questions, concerns, or complaints.    Primary cancer of right lower lobe of lung ([place])   Staging form: Lung, AJCC 7th Edition     Clinical: T1, N2, M0 - Signed by Forest Gleason, MD on 07/12/2015   Forest Gleason, MD   10/08/2015 7:58 AM

## 2015-10-10 ENCOUNTER — Inpatient Hospital Stay: Payer: Medicaid Other

## 2015-10-17 ENCOUNTER — Inpatient Hospital Stay: Payer: Medicaid Other | Attending: Oncology

## 2015-10-17 DIAGNOSIS — F319 Bipolar disorder, unspecified: Secondary | ICD-10-CM | POA: Insufficient documentation

## 2015-10-17 DIAGNOSIS — Z923 Personal history of irradiation: Secondary | ICD-10-CM | POA: Insufficient documentation

## 2015-10-17 DIAGNOSIS — Z807 Family history of other malignant neoplasms of lymphoid, hematopoietic and related tissues: Secondary | ICD-10-CM | POA: Insufficient documentation

## 2015-10-17 DIAGNOSIS — F419 Anxiety disorder, unspecified: Secondary | ICD-10-CM | POA: Insufficient documentation

## 2015-10-17 DIAGNOSIS — Z87891 Personal history of nicotine dependence: Secondary | ICD-10-CM | POA: Insufficient documentation

## 2015-10-17 DIAGNOSIS — Z803 Family history of malignant neoplasm of breast: Secondary | ICD-10-CM | POA: Insufficient documentation

## 2015-10-17 DIAGNOSIS — Z800 Family history of malignant neoplasm of digestive organs: Secondary | ICD-10-CM | POA: Insufficient documentation

## 2015-10-17 DIAGNOSIS — C3431 Malignant neoplasm of lower lobe, right bronchus or lung: Secondary | ICD-10-CM | POA: Insufficient documentation

## 2015-10-17 DIAGNOSIS — Z79899 Other long term (current) drug therapy: Secondary | ICD-10-CM | POA: Diagnosis not present

## 2015-10-17 DIAGNOSIS — J449 Chronic obstructive pulmonary disease, unspecified: Secondary | ICD-10-CM | POA: Insufficient documentation

## 2015-10-17 DIAGNOSIS — C3490 Malignant neoplasm of unspecified part of unspecified bronchus or lung: Secondary | ICD-10-CM

## 2015-10-17 DIAGNOSIS — M25519 Pain in unspecified shoulder: Secondary | ICD-10-CM | POA: Diagnosis not present

## 2015-10-17 DIAGNOSIS — G8929 Other chronic pain: Secondary | ICD-10-CM | POA: Diagnosis not present

## 2015-10-17 DIAGNOSIS — F1910 Other psychoactive substance abuse, uncomplicated: Secondary | ICD-10-CM | POA: Diagnosis not present

## 2015-10-17 DIAGNOSIS — Z9221 Personal history of antineoplastic chemotherapy: Secondary | ICD-10-CM | POA: Diagnosis not present

## 2015-10-17 DIAGNOSIS — K208 Other esophagitis: Secondary | ICD-10-CM | POA: Diagnosis not present

## 2015-10-17 DIAGNOSIS — M549 Dorsalgia, unspecified: Secondary | ICD-10-CM | POA: Insufficient documentation

## 2015-10-17 LAB — CBC WITH DIFFERENTIAL/PLATELET
BASOS PCT: 1 %
Basophils Absolute: 0 10*3/uL (ref 0–0.1)
EOS ABS: 0.1 10*3/uL (ref 0–0.7)
EOS PCT: 3 %
HCT: 29.5 % — ABNORMAL LOW (ref 35.0–47.0)
Hemoglobin: 10 g/dL — ABNORMAL LOW (ref 12.0–16.0)
Lymphocytes Relative: 25 %
Lymphs Abs: 0.5 10*3/uL — ABNORMAL LOW (ref 1.0–3.6)
MCH: 34.3 pg — ABNORMAL HIGH (ref 26.0–34.0)
MCHC: 34 g/dL (ref 32.0–36.0)
MCV: 100.8 fL — ABNORMAL HIGH (ref 80.0–100.0)
MONO ABS: 0.2 10*3/uL (ref 0.2–0.9)
Monocytes Relative: 11 %
Neutro Abs: 1.2 10*3/uL — ABNORMAL LOW (ref 1.4–6.5)
Neutrophils Relative %: 60 %
PLATELETS: 135 10*3/uL — AB (ref 150–440)
RBC: 2.93 MIL/uL — AB (ref 3.80–5.20)
RDW: 14.6 % — AB (ref 11.5–14.5)
WBC: 2.1 10*3/uL — AB (ref 3.6–11.0)

## 2015-10-22 ENCOUNTER — Other Ambulatory Visit: Payer: Self-pay | Admitting: *Deleted

## 2015-10-22 ENCOUNTER — Other Ambulatory Visit: Payer: Self-pay | Admitting: Radiation Oncology

## 2015-10-22 ENCOUNTER — Encounter: Payer: Self-pay | Admitting: Oncology

## 2015-10-22 DIAGNOSIS — C3431 Malignant neoplasm of lower lobe, right bronchus or lung: Secondary | ICD-10-CM

## 2015-10-23 MED ORDER — PROCHLORPERAZINE MALEATE 10 MG PO TABS: 10.0000 mg | ORAL_TABLET | Freq: Four times a day (QID) | ORAL | Status: DC | PRN

## 2015-10-23 NOTE — Telephone Encounter (Signed)
Per Georgeanne Nim, NP, we will not refill percocet or guafenesi-codeine at this time. Pt will need to get from another provider to manage chronic pain.

## 2015-10-24 ENCOUNTER — Inpatient Hospital Stay: Payer: Medicaid Other

## 2015-10-26 ENCOUNTER — Telehealth: Payer: Self-pay | Admitting: *Deleted

## 2015-10-26 ENCOUNTER — Other Ambulatory Visit: Payer: Self-pay | Admitting: *Deleted

## 2015-10-26 MED ORDER — LORAZEPAM 1 MG PO TABS: ORAL_TABLET | ORAL | Status: DC

## 2015-10-26 NOTE — Telephone Encounter (Signed)
Called in #1 tab

## 2015-10-29 ENCOUNTER — Ambulatory Visit
Admission: RE | Admit: 2015-10-29 | Discharge: 2015-10-29 | Disposition: A | Discharge status: Home / Self Care | Payer: Self-pay | Source: Ambulatory Visit | Attending: Oncology | Admitting: Oncology

## 2015-10-29 DIAGNOSIS — C3490 Malignant neoplasm of unspecified part of unspecified bronchus or lung: Secondary | ICD-10-CM | POA: Insufficient documentation

## 2015-10-29 MED ORDER — IOPAMIDOL (ISOVUE-370) INJECTION 76%
100.0000 mL | Freq: Once | INTRAVENOUS | Status: AC | PRN
  Administered 2015-10-29: 100 mL via INTRAVENOUS

## 2015-11-01 ENCOUNTER — Encounter: Payer: Self-pay | Admitting: Radiation Oncology

## 2015-11-01 ENCOUNTER — Inpatient Hospital Stay: Payer: Medicaid Other

## 2015-11-01 ENCOUNTER — Other Ambulatory Visit: Payer: Self-pay | Admitting: *Deleted

## 2015-11-01 ENCOUNTER — Ambulatory Visit
Admission: RE | Admit: 2015-11-01 | Discharge: 2015-11-01 | Disposition: A | Discharge status: Home / Self Care | Payer: Medicaid Other | Source: Ambulatory Visit | Attending: Radiation Oncology | Admitting: Radiation Oncology

## 2015-11-01 ENCOUNTER — Inpatient Hospital Stay (HOSPITAL_BASED_OUTPATIENT_CLINIC_OR_DEPARTMENT_OTHER): Payer: Medicaid Other | Admitting: Oncology

## 2015-11-01 VITALS — BP 144/103 | HR 112 | Temp 98.6°F | Resp 18 | Wt 142.4 lb

## 2015-11-01 DIAGNOSIS — Z800 Family history of malignant neoplasm of digestive organs: Secondary | ICD-10-CM

## 2015-11-01 DIAGNOSIS — C3490 Malignant neoplasm of unspecified part of unspecified bronchus or lung: Secondary | ICD-10-CM

## 2015-11-01 DIAGNOSIS — Z510 Encounter for antineoplastic radiation therapy: Secondary | ICD-10-CM | POA: Insufficient documentation

## 2015-11-01 DIAGNOSIS — K208 Other esophagitis: Secondary | ICD-10-CM

## 2015-11-01 DIAGNOSIS — R4702 Dysphasia: Secondary | ICD-10-CM | POA: Insufficient documentation

## 2015-11-01 DIAGNOSIS — M549 Dorsalgia, unspecified: Secondary | ICD-10-CM

## 2015-11-01 DIAGNOSIS — F1910 Other psychoactive substance abuse, uncomplicated: Secondary | ICD-10-CM

## 2015-11-01 DIAGNOSIS — Z79899 Other long term (current) drug therapy: Secondary | ICD-10-CM

## 2015-11-01 DIAGNOSIS — F319 Bipolar disorder, unspecified: Secondary | ICD-10-CM

## 2015-11-01 DIAGNOSIS — C3431 Malignant neoplasm of lower lobe, right bronchus or lung: Secondary | ICD-10-CM | POA: Insufficient documentation

## 2015-11-01 DIAGNOSIS — J449 Chronic obstructive pulmonary disease, unspecified: Secondary | ICD-10-CM

## 2015-11-01 DIAGNOSIS — F419 Anxiety disorder, unspecified: Secondary | ICD-10-CM

## 2015-11-01 DIAGNOSIS — Z9221 Personal history of antineoplastic chemotherapy: Secondary | ICD-10-CM

## 2015-11-01 DIAGNOSIS — Z87891 Personal history of nicotine dependence: Secondary | ICD-10-CM

## 2015-11-01 DIAGNOSIS — G8929 Other chronic pain: Secondary | ICD-10-CM

## 2015-11-01 DIAGNOSIS — M25519 Pain in unspecified shoulder: Secondary | ICD-10-CM

## 2015-11-01 DIAGNOSIS — Z923 Personal history of irradiation: Secondary | ICD-10-CM

## 2015-11-01 DIAGNOSIS — R51 Headache: Secondary | ICD-10-CM | POA: Insufficient documentation

## 2015-11-01 DIAGNOSIS — Z803 Family history of malignant neoplasm of breast: Secondary | ICD-10-CM

## 2015-11-01 DIAGNOSIS — Z807 Family history of other malignant neoplasms of lymphoid, hematopoietic and related tissues: Secondary | ICD-10-CM

## 2015-11-01 DIAGNOSIS — C3491 Malignant neoplasm of unspecified part of right bronchus or lung: Secondary | ICD-10-CM

## 2015-11-01 LAB — CBC WITH DIFFERENTIAL/PLATELET
BASOS ABS: 0 10*3/uL (ref 0–0.1)
Basophils Relative: 1 %
EOS PCT: 1 %
Eosinophils Absolute: 0 10*3/uL (ref 0–0.7)
HCT: 33.3 % — ABNORMAL LOW (ref 35.0–47.0)
Hemoglobin: 11.6 g/dL — ABNORMAL LOW (ref 12.0–16.0)
LYMPHS ABS: 0.9 10*3/uL — AB (ref 1.0–3.6)
LYMPHS PCT: 13 %
MCH: 35.1 pg — AB (ref 26.0–34.0)
MCHC: 34.7 g/dL (ref 32.0–36.0)
MCV: 100.9 fL — AB (ref 80.0–100.0)
Monocytes Absolute: 0.5 10*3/uL (ref 0.2–0.9)
Monocytes Relative: 8 %
NEUTROS ABS: 5.4 10*3/uL (ref 1.4–6.5)
NEUTROS PCT: 77 %
PLATELETS: 225 10*3/uL (ref 150–440)
RBC: 3.3 MIL/uL — ABNORMAL LOW (ref 3.80–5.20)
RDW: 15 % — AB (ref 11.5–14.5)
WBC: 6.9 10*3/uL (ref 3.6–11.0)

## 2015-11-01 LAB — COMPREHENSIVE METABOLIC PANEL
ALT: 8 U/L — ABNORMAL LOW (ref 14–54)
ANION GAP: 4 — AB (ref 5–15)
AST: 18 U/L (ref 15–41)
Albumin: 4 g/dL (ref 3.5–5.0)
Alkaline Phosphatase: 79 U/L (ref 38–126)
BUN: 13 mg/dL (ref 6–20)
CHLORIDE: 106 mmol/L (ref 101–111)
CO2: 28 mmol/L (ref 22–32)
Calcium: 8.9 mg/dL (ref 8.9–10.3)
Creatinine, Ser: 0.79 mg/dL (ref 0.44–1.00)
Glucose, Bld: 99 mg/dL (ref 65–99)
POTASSIUM: 3.9 mmol/L (ref 3.5–5.1)
Sodium: 138 mmol/L (ref 135–145)
Total Bilirubin: 0.3 mg/dL (ref 0.3–1.2)
Total Protein: 6.8 g/dL (ref 6.5–8.1)

## 2015-11-01 LAB — MAGNESIUM: MAGNESIUM: 2.2 mg/dL (ref 1.7–2.4)

## 2015-11-01 MED ORDER — SUCRALFATE 1 G PO TABS: 1.0000 g | ORAL_TABLET | Freq: Four times a day (QID) | ORAL | Status: DC

## 2015-11-01 MED ORDER — SUCRALFATE 1 G PO TABS: 1.0000 g | ORAL_TABLET | Freq: Three times a day (TID) | ORAL | Status: DC

## 2015-11-01 MED ORDER — GUAIFENESIN-CODEINE 100-10 MG/5ML PO SOLN: 5.0000 mL | Freq: Three times a day (TID) | ORAL | Status: DC | PRN

## 2015-11-01 NOTE — Progress Notes (Signed)
Patient c/o cough when she lies down at night.  States she has an area on esophagus from radiation that makes it hard to swallow.  Patient states Dr. Donella Stade wanted her to ask for Tussinox and carafate.  Wants Dr. Oliva Bustard to manage meds?  Patient states she is having severe headaches but has no PCP.  Asking if she can get medication for the pain?

## 2015-11-01 NOTE — Progress Notes (Signed)
Radiation Oncology Follow up Note  Name: Charlene [name]   Date:   11/01/2015 MRN:  [id number] DOB: Oct 09, 1960    This 55 y.o. female presents to the clinic today for follow-up for small cell lung cancer limited stage disease (T1 N2 M0) status post chemotherapy and radiation now 1 month out.  REFERRING PROVIDER: Jiles Garter, MD  HPI: Patient is a 55 year old female now out 1 month combined modality treatment for stage IIIa (T1 N2 M0 small cell lung cancer of the right lung. She seen today in routine follow-up and is doing well. She had a repeat CT scan showing complete resolution of both nodes as well as right lung mass. She's complains of some dysphasia. We will start her on some Carafate rinses for that. She specifically denies cough hemoptysis or chest tightness. She is having some headaches. Prior workup CT scan of the head showed no evidence of intracranial metastases lesions.  COMPLICATIONS OF TREATMENT: none  FOLLOW UP COMPLIANCE: keeps appointments   PHYSICAL EXAM:  BP 138/90 mmHg  Pulse 111  Temp(Src) 99.4 F (37.4 C)  Resp 20  Wt 142 lb 8.4 oz (64.65 kg) Well-developed well-nourished patient in NAD. HEENT reveals PERLA, EOMI, discs not visualized.  Oral cavity is clear. No oral mucosal lesions are identified. Neck is clear without evidence of cervical or supraclavicular adenopathy. Lungs are clear to A&P. Cardiac examination is essentially unremarkable with regular rate and rhythm without murmur rub or thrill. Abdomen is benign with no organomegaly or masses noted. Motor sensory and DTR levels are equal and symmetric in the upper and lower extremities. Cranial nerves II through XII are grossly intact. Proprioception is intact. No peripheral adenopathy or edema is identified. No motor or sensory levels are noted. Crude visual fields are within normal range.  RADIOLOGY RESULTS: Serial CT scans are reviewed and compatible with the above-stated findings.  PLAN: At this time like  to go ahead with prophylactic radiation to her whole brain to eradicate microscopic residual disease. Would plan on 3000 cGy over 3 weeks. Risks and benefits of treatment including possible cognitive decline, hair loss, skin reaction fatigue alteration of blood counts all were described in detail to the patient and her father. I personally set up and ordered CT simulation for next week. I've discussed the case in her results both with the patient and medical oncology.  I would like to take this opportunity for allowing me to participate in the care of your patient.Armstead Peaks., MD

## 2015-11-02 ENCOUNTER — Other Ambulatory Visit: Payer: Self-pay | Admitting: *Deleted

## 2015-11-02 ENCOUNTER — Encounter: Payer: Self-pay | Admitting: Oncology

## 2015-11-02 NOTE — Progress Notes (Signed)
[place] @ [facility] Telephone:([phone]  Fax:(336) [phone]   FOLLOW UP NOTE  Charlene [name] OB: 02-21-1961  MR#: [id number]  PVX#:[id number]  Patient Care Team: Jiles Garter, MD as PCP - General (Family Medicine)  CHIEF COMPLAINT:  Chief Complaint  Patient presents with  . 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  Needle aspiration of lymph node was positive for small cell carcinoma of lung. Clinically  Staged  As  T1 N2 M0 tumor.    2.PET scan shows localized disease.MRI of brain is negative for any metastases  3.  Chemotherapy with cis-platinum and VP-16 has been started on July 18, 2015 4.starting radiation therapy to the chest from August 23 2015 .  5.Last chemotherapy (fourth cycle) October 03, 2015 patient has finished radiation therapy VISIT DIAGNOSIS:     ICD-9-CM ICD-10-CM   1. Primary cancer of right lower lobe of lung (HCC) 162.5 C34.31 guaiFENesin-codeine 100-10 MG/5ML syrup     sucralfate (CARAFATE) 1 g tablet     CBC with Differential     Comprehensive metabolic panel     CT Abdomen W Contrast     CT Chest W Contrast      No history exists.      INTERVAL HISTORY: 55 year old lady with a multiple other comorbid condition.  Had the polysubstance abuse.  Bipolar disease.  COPD.  Chronic back pain and shoulder pain.  Mood disorder. Patient presented to emergency room at [facility] complaining of back pain left shoulder pain and had MRI scan done.  Patient complains of cough yellowish expectoration sometime blood stained sputum.  Sees very poor historian.  Extremely nervous and agitated lady.  Accompanied with his brother, Assistant continues to have chronic back pain neck pain.  Cough.  Occasional nausea and vomiting.  Patient is here for further evaluation and treatment consideration.  Tolerating treatment very well.  Mild nausea.  Some  discomfort.  Has a chronic back pain.  No tingling.  No numbness.  No soreness of mouth.. Patient c/o cough when she lies down at night. States she has an area on esophagus from radiation that makes it hard to swallow. Patient states Dr. Donella Stade wanted her to ask for Tussinox and carafate. Wants Dr. Oliva Bustard to manage meds? Patient states she is having severe headaches but has no PCP. Asking if she can get medication for the pain?  She had a CT scan done which has been reevaluated independently.  Also had discussion with Dr. Donella Stade regarding prophylactic cranial radiation therapy     REVIEW OF SYSTEMS:   Gen. status: Extremely agitated and anxious lady.  Not any acute distress. Lungs: Increasing shortness of breath.  Cough. As described above in history of present illness HEENT: No headache no dizziness.  Sore throat as described above GI: No nausea no vomiting but has lost some weight.  No diarrhea.  No hematemesis or melena Musculoskeletal system back pain and left shoulder pain. Patient has chronic pain.  Recently had MRI scan of left shoulder which is was revealing tendon tear. Cardiac: No chest pain no palpitation GU: No dysuria hematuria Skin: No rash Lower extremity no swelling Psychiatric system: Patient has bipolar disease.  Polysubstance abuse.  Now being followed by MENTAL  health department Patient lives with her father.   As per HPI. Otherwise, a complete review of systems is negatve.  PAST MEDICAL HISTORY:  Past Medical History  Diagnosis Date  . Primary cancer of right lower lobe of lung ([place]) 07/12/2015    Small cell undifferentiated carcinoma of lung.  Diagnosis at [facility] by fine-needle aspiration of lymph node (January, 2017)    PAST SURGICAL HISTORY: Abdominal hernia surgery. Hysterectomy.  Appendectomy.  FAMILY HISTORY  BROTHER HAD CHOLANGIOCARCINOMA AND HODGKIN'S DISEASE.   OTHER SISTER HAD A BREAST CANCER.   GYNECOLOGIC HISTORY:    Patient  had a  hysterectomy in the past    ADVANCED DIRECTIVES:  Patient does not have any living will or healthcare power of attorney.  Information was given .  Available resources had been discussed.  We will follow-up on subsequent appointments regarding this issue  HEALTH MAINTENANCE: Social History  Substance Use Topics  . Smoking status: Former Smoker -- 0.50 packs/day for 30 years    Quit date: 06/17/2015  . Smokeless tobacco: None  . Alcohol Use: No       Allergies  Allergen Reactions  . Amoxicillin-Pot Clavulanate Hives    Also vomiting    Current Outpatient Prescriptions  Medication Sig Dispense Refill  . albuterol (PROVENTIL HFA) 108 (90 Base) MCG/ACT inhaler     . albuterol (PROVENTIL HFA;VENTOLIN HFA) 108 (90 Base) MCG/ACT inhaler Inhale into the lungs.    . benzonatate (TESSALON PERLES) 100 MG capsule Take 100 mg by mouth.    . cyclobenzaprine (FLEXERIL) 10 MG tablet Take 10 mg by mouth.    . diclofenac (VOLTAREN) 75 MG EC tablet Take 75 mg by mouth.    . Diphenhyd-Hydrocort-Nystatin (FIRST-DUKES MOUTHWASH) SUSP Take 35m by mouth, swish and swallow three times a day 237 mL 3  . DULoxetine (CYMBALTA) 60 MG capsule Take 60 mg by mouth. Reported on 11/01/2015    . fluticasone (FLONASE) 50 MCG/ACT nasal spray 1 spray by Each Nare route daily.    .Marland KitchenguaiFENesin-codeine 100-10 MG/5ML syrup Take 5 mLs by mouth 3 (three) times daily as needed for cough. 240 mL 0  . lidocaine (ASPERCREME W/LIDOCAINE) 4 % cream Apply 2 g topically Three (3) times a day as needed.    . lidocaine (XYLOCAINE) 5 % ointment Apply topically.    . lidocaine-prilocaine (EMLA) cream Apply 1 application topically as needed. 30 g 3  . loratadine (CLARITIN) 10 MG tablet Take 10 mg by mouth.    .Marland KitchenLORazepam (ATIVAN) 1 MG tablet Take 1 tablet by mouth once 1 hour prior to scan 1 tablet 0  . nystatin (MYCOSTATIN) 100000 UNIT/ML suspension Take by mouth. Reported on 11/01/2015    . ondansetron (ZOFRAN) 4 MG tablet Take 4  mg by mouth.    . oxyCODONE-acetaminophen (PERCOCET) 7.5-325 MG tablet Take 1 tablet by mouth every 4 (four) hours as needed for severe pain. 30 tablet 0  . prochlorperazine (COMPAZINE) 10 MG tablet Take 1 tablet (10 mg total) by mouth every 6 (six) hours as needed for nausea or vomiting. 30 tablet 3  . Saline (AYR SALINE NASAL DROPS) 0.65 % (Soln) SOLN 2 drops by Each Nare route every four (4) hours as needed.    . sucralfate (CARAFATE) 1 g tablet Take 1 tablet (1 g total) by mouth 4 (four) times daily. Dissolve in 2-3 tbsp warm water, swish and swallow. 120 tablet 3   No current facility-administered medications for this visit.    OBJECTIVE: PHYSICAL EXAM: Gen. status: Extremely anxious lady. Head exam was generally normal. There was no scleral icterus or corneal arcus. Mucous membranes were moist.  Examination of the throat is within normal limit Chest: Diminished air entry on both sides.  Occasional rhonchi.  Emphysema test chest. Cardiac: Tachycardia Abdominal exam revealed normal bowel sounds. The abdomen was soft, non-tender, and without masses, organomegaly, or appreciable enlargement of the abdominal aorta. Neurologically, the patient was awake, alert, and oriented to person, place and time. There were no obvious focal neurologic abnormalities. Examination of the skin revealed no evidence of significant rashes, suspicious appearing nevi or other concerning lesions. Lower extremity no edema Lymphatic system: Supraclavicular, cervical, axillary, inguinal lymph nodes are not palpable   Filed Vitals:   11/01/15 1439  BP: 144/103  Pulse: 112  Temp: 98.6 F (37 C)  Resp: 18     Body mass index is 21.66 kg/(m^2).    ECOG FS:1 - Symptomatic but completely ambulatory  LAB RESULTS:    1. Marked interval response to therapy with complete resolution of the nodularity seen previously in the right lower lobe. 2. The inferior right hilar lymphadenopathy has resolved completely in the  interval. There is an upper normal size lymph node in the upper right hilum on today's study. 3. Fluid/debris in multiple right lower lobe airways. Attention on follow-up recommended. 4. No new or progressive findings since the previous PET-CT. 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 CT scan has been reviewed independently shows excellent response. Scan independently and reviewed with the patient and family Also discussed situation with Dr. 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 B in 3 months with a repeat CT scan or before he patient develops any new symptoms   s.    Primary cancer of right lower lobe of lung ([place])   Staging form: Lung, AJCC 7th Edition     Clinical: T1, N2, M0 - Signed by Forest Gleason, MD on 07/12/2015   Forest Gleason, MD   11/02/2015 4:47 PM

## 2015-11-04 ENCOUNTER — Encounter: Payer: Self-pay | Admitting: Radiation Oncology

## 2015-11-05 ENCOUNTER — Encounter: Payer: Self-pay | Admitting: Oncology

## 2015-11-08 ENCOUNTER — Inpatient Hospital Stay (HOSPITAL_BASED_OUTPATIENT_CLINIC_OR_DEPARTMENT_OTHER): Payer: Medicaid Other | Admitting: Oncology

## 2015-11-08 ENCOUNTER — Other Ambulatory Visit: Payer: Self-pay | Admitting: *Deleted

## 2015-11-08 ENCOUNTER — Ambulatory Visit
Admission: RE | Admit: 2015-11-08 | Discharge: 2015-11-08 | Disposition: A | Discharge status: Home / Self Care | Payer: Medicaid Other | Source: Ambulatory Visit | Attending: Radiation Oncology | Admitting: Radiation Oncology

## 2015-11-08 ENCOUNTER — Ambulatory Visit
Admission: RE | Admit: 2015-11-08 | Discharge: 2015-11-08 | Disposition: A | Discharge status: Home / Self Care | Payer: Medicaid Other | Source: Ambulatory Visit | Attending: Oncology | Admitting: Oncology

## 2015-11-08 ENCOUNTER — Encounter: Payer: Self-pay | Admitting: Oncology

## 2015-11-08 VITALS — BP 106/74 | HR 96 | Temp 98.0°F | Resp 18 | Wt 149.3 lb

## 2015-11-08 DIAGNOSIS — G8929 Other chronic pain: Secondary | ICD-10-CM

## 2015-11-08 DIAGNOSIS — Z9221 Personal history of antineoplastic chemotherapy: Secondary | ICD-10-CM | POA: Diagnosis not present

## 2015-11-08 DIAGNOSIS — C3431 Malignant neoplasm of lower lobe, right bronchus or lung: Secondary | ICD-10-CM

## 2015-11-08 DIAGNOSIS — Z87891 Personal history of nicotine dependence: Secondary | ICD-10-CM

## 2015-11-08 DIAGNOSIS — M7989 Other specified soft tissue disorders: Secondary | ICD-10-CM

## 2015-11-08 DIAGNOSIS — Z923 Personal history of irradiation: Secondary | ICD-10-CM

## 2015-11-08 DIAGNOSIS — R51 Headache: Secondary | ICD-10-CM | POA: Diagnosis not present

## 2015-11-08 DIAGNOSIS — K208 Other esophagitis: Secondary | ICD-10-CM

## 2015-11-08 DIAGNOSIS — Z510 Encounter for antineoplastic radiation therapy: Secondary | ICD-10-CM | POA: Diagnosis present

## 2015-11-08 DIAGNOSIS — J449 Chronic obstructive pulmonary disease, unspecified: Secondary | ICD-10-CM

## 2015-11-08 DIAGNOSIS — R4702 Dysphasia: Secondary | ICD-10-CM | POA: Diagnosis not present

## 2015-11-08 DIAGNOSIS — M549 Dorsalgia, unspecified: Secondary | ICD-10-CM

## 2015-11-08 DIAGNOSIS — Z800 Family history of malignant neoplasm of digestive organs: Secondary | ICD-10-CM

## 2015-11-08 DIAGNOSIS — Z803 Family history of malignant neoplasm of breast: Secondary | ICD-10-CM

## 2015-11-08 DIAGNOSIS — Z79899 Other long term (current) drug therapy: Secondary | ICD-10-CM

## 2015-11-08 DIAGNOSIS — F419 Anxiety disorder, unspecified: Secondary | ICD-10-CM

## 2015-11-08 DIAGNOSIS — Z807 Family history of other malignant neoplasms of lymphoid, hematopoietic and related tissues: Secondary | ICD-10-CM

## 2015-11-08 DIAGNOSIS — F1910 Other psychoactive substance abuse, uncomplicated: Secondary | ICD-10-CM

## 2015-11-08 DIAGNOSIS — F319 Bipolar disorder, unspecified: Secondary | ICD-10-CM

## 2015-11-08 DIAGNOSIS — M25519 Pain in unspecified shoulder: Secondary | ICD-10-CM

## 2015-11-08 NOTE — Progress Notes (Signed)
Patient's feet and legs are red and swollen, bilaterally. Continues to have cough.  States she has had some dizziness as well.

## 2015-11-09 ENCOUNTER — Other Ambulatory Visit: Payer: Self-pay | Admitting: *Deleted

## 2015-11-09 ENCOUNTER — Encounter: Payer: Self-pay | Admitting: Oncology

## 2015-11-09 ENCOUNTER — Encounter: Payer: Self-pay | Admitting: *Deleted

## 2015-11-09 ENCOUNTER — Telehealth: Payer: Self-pay | Admitting: *Deleted

## 2015-11-09 MED ORDER — DOXYCYCLINE HYCLATE 100 MG PO CAPS: 100.0000 mg | ORAL_CAPSULE | Freq: Two times a day (BID) | ORAL | Status: DC

## 2015-11-09 NOTE — Telephone Encounter (Signed)
Pt and pt's father made aware that a prescription will be called into pharmacy. Instructed pt to callback if has any further problems. Pt verbalized understanding.

## 2015-11-09 NOTE — Progress Notes (Signed)
Per orders from Dr. Grayland Ormond, Doxycycline BID x 7 days sent to patients pharmacy.

## 2015-11-09 NOTE — Progress Notes (Signed)
[place] @ [facility] [phone]  [phone]   FOLLOW UP NOTE  LOUANNE CALVILLO OB: January 30, 1961  MR#: [id number]  HFW#:[id number]  Patient Care Team: Jiles Garter, MD as PCP - General (Family Medicine)  CHIEF COMPLAINT:  Chief Complaint  Patient presents with  . 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  Needle aspiration of lymph node was positive for small cell carcinoma of lung. Clinically  Staged  As  T1 N2 M0 tumor.    2.PET scan shows localized disease.MRI of brain is negative for any metastases  3.  Chemotherapy with cis-platinum and VP-16 has been started on July 18, 2015 4.starting radiation therapy to the chest from August 23 2015 .  5.Last chemotherapy (fourth cycle) October 03, 2015 patient has finished radiation therapy.  6.  Patient is started prophylactic cranial radiation therapy (May of 2017) VISIT DIAGNOSIS:     ICD-9-CM ICD-10-CM   1. Right leg swelling 729.81 M79.89 US Venous Img Lower Unilateral Right     CANCELED: US Venous Img Lower Unilateral Right      No history exists.      INTERVAL HISTORY: 55 year old lady with a multiple other comorbid condition.  Had the polysubstance abuse.  Bipolar disease.  COPD.  Chronic back pain and shoulder pain.  Mood disorder. Patient presented to emergency room at [facility] complaining of back pain left shoulder pain and had MRI scan done.  Patient complains of cough yellowish expectoration sometime blood stained sputum.  Sees very poor historian.  Extremely nervous and agitated lady.  Accompanied with his brother, Assistant continues to have chronic back pain neck pain.  Cough.  Occasional nausea and vomiting.  Patient is here for further evaluation and treatment consideration.  Tolerating treatment very well.  Mild nausea.  Some discomfort.  Has a chronic back pain.  No tingling.  No  numbness.  No soreness of mouth.. Patient c/o cough when she lies down at night. States she has an area on esophagus from radiation that makes it hard to swallow. Patient states Dr. Donella Stade wanted her to ask for Tussinox and carafate. Wants Dr. Oliva Bustard to manage meds? Patient states she is having severe headaches but has no PCP. Asking if she can get medication for the pain?  She had a CT scan done which has been reevaluated independently.  Also had discussion with Dr. Donella Stade regarding prophylactic cranial radiation therapy         Patient's feet and legs are red and swollen, bilaterally. Continues to have cough. States she has had some dizziness as well   Patient will red her CT scan report and had number of questions.  SHE   is getting prophylactic radiation to the brain Also has noticed Ultrasound of the right lower extremity has been ordered to rule out a deep vein thrombosis REVIEW OF SYSTEMS:   Gen. status: Extremely agitated and anxious lady.  Not any acute distress. Lungs: Increasing shortness of breath.  Cough. As described above in history of present illness HEENT: No headache no dizziness.  Sore throat as described above GI: No nausea no vomiting but has lost some weight.  No diarrhea.  No hematemesis or melena Musculoskeletal system back pain and left shoulder pain. Patient has chronic pain.  Recently had MRI scan of left shoulder which is was revealing tendon tear. Cardiac: No chest pain no palpitation GU:  No dysuria hematuria Skin: No rash Lower extremity no swelling Psychiatric system: Patient has bipolar disease.  Polysubstance abuse.  Now being followed by MENTAL  health department Patient lives with her father.   As per HPI. Otherwise, a complete review of systems is negatve.  PAST MEDICAL HISTORY: Past Medical History  Diagnosis Date  . Primary cancer of right lower lobe of lung ([place]) 07/12/2015    Small cell undifferentiated carcinoma of lung.  Diagnosis at [facility] by fine-needle aspiration of lymph node (January, 2017)    PAST SURGICAL HISTORY: Abdominal hernia surgery. Hysterectomy.  Appendectomy.  FAMILY HISTORY  BROTHER HAD CHOLANGIOCARCINOMA AND HODGKIN'S DISEASE.   OTHER SISTER HAD A BREAST CANCER.   GYNECOLOGIC HISTORY:    Patient  had a hysterectomy in the past    ADVANCED DIRECTIVES:  Patient does not have any living will or healthcare power of attorney.  Information was given .  Available resources had been discussed.  We will follow-up on subsequent appointments regarding this issue  HEALTH MAINTENANCE: Social History  Substance Use Topics  . Smoking status: Former Smoker -- 0.50 packs/day for 30 years    Quit date: 06/17/2015  . Smokeless tobacco: None  . Alcohol Use: No       Allergies  Allergen Reactions  . Amoxicillin-Pot Clavulanate Hives    Also vomiting    Current Outpatient Prescriptions  Medication Sig Dispense Refill  . albuterol (PROVENTIL HFA) 108 (90 Base) MCG/ACT inhaler     . albuterol (PROVENTIL HFA;VENTOLIN HFA) 108 (90 Base) MCG/ACT inhaler Inhale into the lungs.    . benzonatate (TESSALON PERLES) 100 MG capsule Take 100 mg by mouth.    . cyclobenzaprine (FLEXERIL) 10 MG tablet Take 10 mg by mouth.    . diclofenac (VOLTAREN) 75 MG EC tablet Take 75 mg by mouth.    . Diphenhyd-Hydrocort-Nystatin (FIRST-DUKES MOUTHWASH) SUSP Take 73m by mouth, swish and swallow three times a day 237 mL 3  . DULoxetine (CYMBALTA) 60 MG capsule Take 60 mg by mouth. Reported on 11/01/2015    . fluticasone (FLONASE) 50 MCG/ACT nasal spray 1 spray by Each Nare route daily.    .Marland KitchenguaiFENesin-codeine 100-10 MG/5ML syrup Take 5 mLs by mouth 3 (three) times daily as needed for cough. 240 mL 0  . lidocaine (ASPERCREME W/LIDOCAINE) 4 % cream Apply 2 g topically Three (3) times a day as needed.    . lidocaine (XYLOCAINE) 5 % ointment Apply topically.    . lidocaine-prilocaine (EMLA) cream Apply 1 application topically as  needed. 30 g 3  . loratadine (CLARITIN) 10 MG tablet Take 10 mg by mouth.    .Marland KitchenLORazepam (ATIVAN) 1 MG tablet Take 1 tablet by mouth once 1 hour prior to scan 1 tablet 0  . nystatin (MYCOSTATIN) 100000 UNIT/ML suspension Take by mouth. Reported on 11/01/2015    . ondansetron (ZOFRAN) 4 MG tablet Take 4 mg by mouth.    . oxyCODONE-acetaminophen (PERCOCET) 7.5-325 MG tablet Take 1 tablet by mouth every 4 (four) hours as needed for severe pain. 30 tablet 0  . prochlorperazine (COMPAZINE) 10 MG tablet Take 1 tablet (10 mg total) by mouth every 6 (six) hours as needed for nausea or vomiting. 30 tablet 3  . Saline (AYR SALINE NASAL DROPS) 0.65 % (Soln) SOLN 2 drops by Each Nare route every four (4) hours as needed.    . sucralfate (CARAFATE) 1 g tablet Take 1 tablet (1 g total) by mouth 4 (four) times  daily. Dissolve in 2-3 tbsp warm water, swish and swallow. 120 tablet 3  . doxycycline (VIBRAMYCIN) 100 MG capsule Take 1 capsule (100 mg total) by mouth 2 (two) times daily. 14 capsule 0   No current facility-administered medications for this visit.    OBJECTIVE: PHYSICAL EXAM: Gen. status: Extremely anxious lady. Head exam was generally normal. There was no scleral icterus or corneal arcus. Mucous membranes were moist. Examination of the throat is within normal limit Chest: Diminished air entry on both sides.  Occasional rhonchi.  Emphysema test chest.. Planes of dry hacking cough.  Patient cannot buy any inhalers. Cardiac: Tachycardia Abdominal exam revealed normal bowel sounds. The abdomen was soft, non-tender, and without masses, organomegaly, or appreciable enlargement of the abdominal aorta. Neurologically, the patient was awake, alert, and oriented to person, place and time. There were no obvious focal neurologic abnormalities. Examination of the skin revealed no evidence of significant rashes, suspicious appearing nevi or other concerning lesions. Lower extremity no edema Lymphatic system:  Supraclavicular, cervical, axillary, inguinal lymph nodes are not palpable   Filed Vitals:   11/08/15 1126  BP: 106/74  Pulse: 96  Temp: 98 F (36.7 C)  Resp: 18     Body mass index is 22.71 kg/(m^2).    ECOG FS:1 - Symptomatic but completely ambulatory  LAB RESULTS:    1. Marked interval response to therapy with complete resolution of the nodularity seen previously in the right lower lobe. 2. The inferior right hilar lymphadenopathy has resolved completely in the interval. There is an upper normal size lymph node in the upper right hilum on today's study. 3. Fluid/debris in multiple right lower lobe airways. Attention on follow-up recommended. 4. No new or progressive findings since the previous PET-CT. 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 CT scan has been reviewed independently shows excellent response. Scan independently and reviewed with the patient and family Also discussed situation with Dr. 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 B in 3 months with a repeat CT scan or before he patient develops any new symptoms   Had prolonged discussion with patient and family and explained in detail results of the ultrasound.  When a CT scan.  Ultrasound did not reveal any evidence of deep vein thrombosis Possibility of some lymph node enlargement was mentioned and will be followed. Total duration of visit was 30   minutes.  50% or more time was spent in counseling patient and family regarding prognosis and options of treatment and available resources  Primary cancer of right lower lobe of lung ([place])   Staging form: Lung, AJCC 7th Edition     Clinical: T1, N2, M0 - Signed by Forest Gleason, MD on 07/12/2015   Forest Gleason, MD   11/09/2015 8:32 PM

## 2015-11-09 NOTE — Telephone Encounter (Signed)
Pt sent message through [place], informing us that thinks it is cellulitis and states has medication at home to treat. Please advise.

## 2015-11-09 NOTE — Telephone Encounter (Signed)
Pt has swelling in both feet that are causing her skin to crack. Pt would like MD recommendation on how to treat swelling. Had ultrasound of right leg yesterday that was negative for DVT.

## 2015-11-14 DIAGNOSIS — Z510 Encounter for antineoplastic radiation therapy: Secondary | ICD-10-CM | POA: Diagnosis not present

## 2015-11-16 ENCOUNTER — Other Ambulatory Visit: Payer: Self-pay | Admitting: *Deleted

## 2015-11-16 DIAGNOSIS — C3491 Malignant neoplasm of unspecified part of right bronchus or lung: Secondary | ICD-10-CM

## 2015-11-16 MED ORDER — DEXAMETHASONE 4 MG PO TABS: 4.0000 mg | ORAL_TABLET | Freq: Every day | ORAL | Status: DC

## 2015-11-19 ENCOUNTER — Other Ambulatory Visit: Payer: Self-pay | Admitting: *Deleted

## 2015-11-19 ENCOUNTER — Ambulatory Visit
Admission: RE | Admit: 2015-11-19 | Discharge: 2015-11-19 | Disposition: A | Discharge status: Home / Self Care | Payer: Medicaid Other | Source: Ambulatory Visit | Attending: Radiation Oncology | Admitting: Radiation Oncology

## 2015-11-19 DIAGNOSIS — Z510 Encounter for antineoplastic radiation therapy: Secondary | ICD-10-CM | POA: Diagnosis not present

## 2015-11-19 DIAGNOSIS — C3431 Malignant neoplasm of lower lobe, right bronchus or lung: Secondary | ICD-10-CM

## 2015-11-19 MED ORDER — GUAIFENESIN-CODEINE 100-10 MG/5ML PO SOLN: 5.0000 mL | Freq: Three times a day (TID) | ORAL | Status: DC | PRN

## 2015-11-20 ENCOUNTER — Ambulatory Visit: Payer: Medicaid Other

## 2015-11-20 ENCOUNTER — Other Ambulatory Visit: Payer: Self-pay | Admitting: *Deleted

## 2015-11-20 ENCOUNTER — Ambulatory Visit
Admission: RE | Admit: 2015-11-20 | Discharge: 2015-11-20 | Disposition: A | Discharge status: Home / Self Care | Payer: Medicaid Other | Source: Ambulatory Visit | Attending: Radiation Oncology | Admitting: Radiation Oncology

## 2015-11-20 DIAGNOSIS — Z510 Encounter for antineoplastic radiation therapy: Secondary | ICD-10-CM | POA: Diagnosis not present

## 2015-11-21 ENCOUNTER — Ambulatory Visit
Admission: RE | Admit: 2015-11-21 | Discharge: 2015-11-21 | Disposition: A | Discharge status: Home / Self Care | Payer: Medicaid Other | Source: Ambulatory Visit | Attending: Radiation Oncology | Admitting: Radiation Oncology

## 2015-11-21 ENCOUNTER — Ambulatory Visit: Payer: Medicaid Other

## 2015-11-21 DIAGNOSIS — Z510 Encounter for antineoplastic radiation therapy: Secondary | ICD-10-CM | POA: Diagnosis not present

## 2015-11-22 ENCOUNTER — Ambulatory Visit: Payer: Medicaid Other

## 2015-11-22 ENCOUNTER — Other Ambulatory Visit: Payer: Self-pay | Admitting: *Deleted

## 2015-11-22 MED ORDER — OXYCODONE-ACETAMINOPHEN 7.5-325 MG PO TABS: 1.0000 | ORAL_TABLET | Freq: Four times a day (QID) | ORAL | Status: DC | PRN

## 2015-11-23 ENCOUNTER — Ambulatory Visit
Admission: RE | Admit: 2015-11-23 | Discharge: 2015-11-23 | Disposition: A | Discharge status: Home / Self Care | Payer: Medicaid Other | Source: Ambulatory Visit | Attending: Radiation Oncology | Admitting: Radiation Oncology

## 2015-11-23 ENCOUNTER — Ambulatory Visit: Payer: Medicaid Other

## 2015-11-23 DIAGNOSIS — Z510 Encounter for antineoplastic radiation therapy: Secondary | ICD-10-CM | POA: Diagnosis not present

## 2015-11-26 ENCOUNTER — Ambulatory Visit: Payer: Medicaid Other

## 2015-11-26 ENCOUNTER — Ambulatory Visit
Admission: RE | Admit: 2015-11-26 | Discharge: 2015-11-26 | Disposition: A | Discharge status: Home / Self Care | Payer: Medicaid Other | Source: Ambulatory Visit | Attending: Radiation Oncology | Admitting: Radiation Oncology

## 2015-11-26 DIAGNOSIS — Z510 Encounter for antineoplastic radiation therapy: Secondary | ICD-10-CM | POA: Diagnosis not present

## 2015-11-27 ENCOUNTER — Ambulatory Visit: Payer: Medicaid Other

## 2015-11-27 ENCOUNTER — Ambulatory Visit
Admission: RE | Admit: 2015-11-27 | Discharge: 2015-11-27 | Disposition: A | Discharge status: Home / Self Care | Payer: Medicaid Other | Source: Ambulatory Visit | Attending: Radiation Oncology | Admitting: Radiation Oncology

## 2015-11-27 ENCOUNTER — Other Ambulatory Visit: Payer: Self-pay | Admitting: *Deleted

## 2015-11-27 DIAGNOSIS — Z510 Encounter for antineoplastic radiation therapy: Secondary | ICD-10-CM | POA: Diagnosis not present

## 2015-11-27 MED ORDER — FLUCONAZOLE 100 MG PO TABS: 100.0000 mg | ORAL_TABLET | Freq: Every day | ORAL | Status: DC

## 2015-11-28 ENCOUNTER — Inpatient Hospital Stay: Payer: Medicaid Other | Attending: Radiation Oncology

## 2015-11-28 ENCOUNTER — Ambulatory Visit
Admission: RE | Admit: 2015-11-28 | Discharge: 2015-11-28 | Disposition: A | Discharge status: Home / Self Care | Payer: Medicaid Other | Source: Ambulatory Visit | Attending: Radiation Oncology | Admitting: Radiation Oncology

## 2015-11-28 ENCOUNTER — Ambulatory Visit: Payer: Medicaid Other

## 2015-11-28 DIAGNOSIS — Z510 Encounter for antineoplastic radiation therapy: Secondary | ICD-10-CM | POA: Diagnosis not present

## 2015-11-28 DIAGNOSIS — C3431 Malignant neoplasm of lower lobe, right bronchus or lung: Secondary | ICD-10-CM | POA: Insufficient documentation

## 2015-11-28 DIAGNOSIS — Z452 Encounter for adjustment and management of vascular access device: Secondary | ICD-10-CM | POA: Diagnosis not present

## 2015-11-28 DIAGNOSIS — C3491 Malignant neoplasm of unspecified part of right bronchus or lung: Secondary | ICD-10-CM

## 2015-11-28 LAB — CBC
HEMATOCRIT: 35 % (ref 35.0–47.0)
HEMOGLOBIN: 11.9 g/dL — AB (ref 12.0–16.0)
MCH: 33.9 pg (ref 26.0–34.0)
MCHC: 34.1 g/dL (ref 32.0–36.0)
MCV: 99.3 fL (ref 80.0–100.0)
Platelets: 227 10*3/uL (ref 150–440)
RBC: 3.52 MIL/uL — AB (ref 3.80–5.20)
RDW: 13.3 % (ref 11.5–14.5)
WBC: 15.8 10*3/uL — AB (ref 3.6–11.0)

## 2015-11-29 ENCOUNTER — Other Ambulatory Visit: Payer: Self-pay | Admitting: *Deleted

## 2015-11-29 ENCOUNTER — Ambulatory Visit
Admission: RE | Admit: 2015-11-29 | Discharge: 2015-11-29 | Disposition: A | Discharge status: Home / Self Care | Payer: Medicaid Other | Source: Ambulatory Visit | Attending: Radiation Oncology | Admitting: Radiation Oncology

## 2015-11-29 ENCOUNTER — Ambulatory Visit: Payer: Medicaid Other

## 2015-11-29 DIAGNOSIS — Z510 Encounter for antineoplastic radiation therapy: Secondary | ICD-10-CM | POA: Diagnosis not present

## 2015-11-29 MED ORDER — DEXAMETHASONE 4 MG PO TABS: 4.0000 mg | ORAL_TABLET | Freq: Two times a day (BID) | ORAL | Status: DC

## 2015-11-30 ENCOUNTER — Ambulatory Visit: Payer: Medicaid Other

## 2015-11-30 ENCOUNTER — Other Ambulatory Visit: Payer: Self-pay | Admitting: *Deleted

## 2015-11-30 ENCOUNTER — Ambulatory Visit
Admission: RE | Admit: 2015-11-30 | Discharge: 2015-11-30 | Disposition: A | Discharge status: Home / Self Care | Payer: Medicaid Other | Source: Ambulatory Visit | Attending: Radiation Oncology | Admitting: Radiation Oncology

## 2015-11-30 DIAGNOSIS — Z510 Encounter for antineoplastic radiation therapy: Secondary | ICD-10-CM | POA: Diagnosis not present

## 2015-12-03 ENCOUNTER — Ambulatory Visit
Admission: RE | Admit: 2015-12-03 | Discharge: 2015-12-03 | Disposition: A | Discharge status: Home / Self Care | Payer: Medicaid Other | Source: Ambulatory Visit | Attending: Radiation Oncology | Admitting: Radiation Oncology

## 2015-12-03 ENCOUNTER — Ambulatory Visit: Payer: Medicaid Other

## 2015-12-03 DIAGNOSIS — Z510 Encounter for antineoplastic radiation therapy: Secondary | ICD-10-CM | POA: Diagnosis not present

## 2015-12-04 ENCOUNTER — Ambulatory Visit: Payer: Medicaid Other

## 2015-12-04 ENCOUNTER — Ambulatory Visit
Admission: RE | Admit: 2015-12-04 | Discharge: 2015-12-04 | Disposition: A | Discharge status: Home / Self Care | Payer: Medicaid Other | Source: Ambulatory Visit | Attending: Radiation Oncology | Admitting: Radiation Oncology

## 2015-12-04 DIAGNOSIS — Z510 Encounter for antineoplastic radiation therapy: Secondary | ICD-10-CM | POA: Diagnosis not present

## 2015-12-05 ENCOUNTER — Inpatient Hospital Stay: Payer: Medicaid Other

## 2015-12-05 ENCOUNTER — Ambulatory Visit: Payer: Medicaid Other

## 2015-12-05 ENCOUNTER — Ambulatory Visit
Admission: RE | Admit: 2015-12-05 | Discharge: 2015-12-05 | Disposition: A | Discharge status: Home / Self Care | Payer: Medicaid Other | Source: Ambulatory Visit | Attending: Radiation Oncology | Admitting: Radiation Oncology

## 2015-12-05 DIAGNOSIS — Z510 Encounter for antineoplastic radiation therapy: Secondary | ICD-10-CM | POA: Diagnosis not present

## 2015-12-06 ENCOUNTER — Ambulatory Visit
Admission: RE | Admit: 2015-12-06 | Discharge: 2015-12-06 | Disposition: A | Discharge status: Home / Self Care | Payer: Medicaid Other | Source: Ambulatory Visit | Attending: Radiation Oncology | Admitting: Radiation Oncology

## 2015-12-06 ENCOUNTER — Ambulatory Visit: Payer: Medicaid Other

## 2015-12-06 DIAGNOSIS — Z510 Encounter for antineoplastic radiation therapy: Secondary | ICD-10-CM | POA: Diagnosis not present

## 2015-12-07 ENCOUNTER — Ambulatory Visit: Payer: Medicaid Other

## 2015-12-07 ENCOUNTER — Ambulatory Visit
Admission: RE | Admit: 2015-12-07 | Discharge: 2015-12-07 | Disposition: A | Discharge status: Home / Self Care | Payer: Medicaid Other | Source: Ambulatory Visit | Attending: Radiation Oncology | Admitting: Radiation Oncology

## 2015-12-07 DIAGNOSIS — Z510 Encounter for antineoplastic radiation therapy: Secondary | ICD-10-CM | POA: Diagnosis not present

## 2015-12-10 ENCOUNTER — Ambulatory Visit
Admission: RE | Admit: 2015-12-10 | Discharge: 2015-12-10 | Disposition: A | Discharge status: Home / Self Care | Payer: Medicaid Other | Source: Ambulatory Visit | Attending: Radiation Oncology | Admitting: Radiation Oncology

## 2015-12-10 ENCOUNTER — Ambulatory Visit: Payer: Medicaid Other

## 2015-12-10 DIAGNOSIS — Z510 Encounter for antineoplastic radiation therapy: Secondary | ICD-10-CM | POA: Diagnosis not present

## 2015-12-11 ENCOUNTER — Other Ambulatory Visit: Payer: Self-pay | Admitting: *Deleted

## 2015-12-11 ENCOUNTER — Ambulatory Visit
Admission: RE | Admit: 2015-12-11 | Discharge: 2015-12-11 | Disposition: A | Discharge status: Home / Self Care | Payer: Medicaid Other | Source: Ambulatory Visit | Attending: Radiation Oncology | Admitting: Radiation Oncology

## 2015-12-11 DIAGNOSIS — Z510 Encounter for antineoplastic radiation therapy: Secondary | ICD-10-CM | POA: Diagnosis not present

## 2015-12-11 MED ORDER — FLUCONAZOLE 100 MG PO TABS: 100.0000 mg | ORAL_TABLET | Freq: Every day | ORAL | Status: DC

## 2015-12-12 ENCOUNTER — Ambulatory Visit: Payer: Medicaid Other

## 2015-12-12 DIAGNOSIS — Z510 Encounter for antineoplastic radiation therapy: Secondary | ICD-10-CM | POA: Diagnosis not present

## 2015-12-13 ENCOUNTER — Ambulatory Visit: Payer: Medicaid Other

## 2015-12-13 DIAGNOSIS — Z510 Encounter for antineoplastic radiation therapy: Secondary | ICD-10-CM | POA: Diagnosis not present

## 2015-12-17 ENCOUNTER — Other Ambulatory Visit: Payer: Self-pay | Admitting: *Deleted

## 2015-12-17 DIAGNOSIS — C3431 Malignant neoplasm of lower lobe, right bronchus or lung: Secondary | ICD-10-CM

## 2015-12-17 MED ORDER — PROCHLORPERAZINE MALEATE 10 MG PO TABS: 10.0000 mg | ORAL_TABLET | Freq: Four times a day (QID) | ORAL | Status: DC | PRN

## 2015-12-20 ENCOUNTER — Emergency Department
Admission: EM | Admit: 2015-12-20 | Discharge: 2015-12-21 | Disposition: A | Discharge status: Other Acute Inpatient Hospital | Payer: Medicaid Other | Attending: Emergency Medicine | Admitting: Emergency Medicine

## 2015-12-20 ENCOUNTER — Emergency Department: Payer: Medicaid Other

## 2015-12-20 ENCOUNTER — Telehealth: Payer: Self-pay | Admitting: *Deleted

## 2015-12-20 ENCOUNTER — Encounter: Payer: Self-pay | Admitting: Emergency Medicine

## 2015-12-20 DIAGNOSIS — Z046 Encounter for general psychiatric examination, requested by authority: Secondary | ICD-10-CM | POA: Diagnosis present

## 2015-12-20 DIAGNOSIS — J449 Chronic obstructive pulmonary disease, unspecified: Secondary | ICD-10-CM | POA: Insufficient documentation

## 2015-12-20 DIAGNOSIS — I10 Essential (primary) hypertension: Secondary | ICD-10-CM | POA: Diagnosis not present

## 2015-12-20 DIAGNOSIS — Z85118 Personal history of other malignant neoplasm of bronchus and lung: Secondary | ICD-10-CM | POA: Insufficient documentation

## 2015-12-20 DIAGNOSIS — F4310 Post-traumatic stress disorder, unspecified: Secondary | ICD-10-CM | POA: Diagnosis present

## 2015-12-20 DIAGNOSIS — F21 Schizotypal disorder: Secondary | ICD-10-CM | POA: Insufficient documentation

## 2015-12-20 DIAGNOSIS — B370 Candidal stomatitis: Secondary | ICD-10-CM | POA: Diagnosis present

## 2015-12-20 DIAGNOSIS — F429 Obsessive-compulsive disorder, unspecified: Secondary | ICD-10-CM | POA: Diagnosis present

## 2015-12-20 DIAGNOSIS — F1420 Cocaine dependence, uncomplicated: Secondary | ICD-10-CM | POA: Diagnosis present

## 2015-12-20 DIAGNOSIS — F312 Bipolar disorder, current episode manic severe with psychotic features: Secondary | ICD-10-CM | POA: Diagnosis present

## 2015-12-20 DIAGNOSIS — F319 Bipolar disorder, unspecified: Secondary | ICD-10-CM | POA: Diagnosis not present

## 2015-12-20 DIAGNOSIS — Z79899 Other long term (current) drug therapy: Secondary | ICD-10-CM | POA: Insufficient documentation

## 2015-12-20 DIAGNOSIS — F068 Other specified mental disorders due to known physiological condition: Secondary | ICD-10-CM | POA: Diagnosis present

## 2015-12-20 DIAGNOSIS — Z87891 Personal history of nicotine dependence: Secondary | ICD-10-CM | POA: Diagnosis not present

## 2015-12-20 DIAGNOSIS — F17200 Nicotine dependence, unspecified, uncomplicated: Secondary | ICD-10-CM | POA: Diagnosis present

## 2015-12-20 HISTORY — DX: Schizotypal disorder: F21

## 2015-12-20 HISTORY — DX: Bipolar II disorder: F31.81

## 2015-12-20 HISTORY — DX: History of falling: Z91.81

## 2015-12-20 LAB — COMPREHENSIVE METABOLIC PANEL
ALK PHOS: 53 U/L (ref 38–126)
ALT: 59 U/L — ABNORMAL HIGH (ref 14–54)
ANION GAP: 8 (ref 5–15)
AST: 108 U/L — ABNORMAL HIGH (ref 15–41)
Albumin: 4.1 g/dL (ref 3.5–5.0)
BUN: 19 mg/dL (ref 6–20)
CALCIUM: 9 mg/dL (ref 8.9–10.3)
CHLORIDE: 98 mmol/L — AB (ref 101–111)
CO2: 29 mmol/L (ref 22–32)
Creatinine, Ser: 0.77 mg/dL (ref 0.44–1.00)
Glucose, Bld: 144 mg/dL — ABNORMAL HIGH (ref 65–99)
Potassium: 4.1 mmol/L (ref 3.5–5.1)
SODIUM: 135 mmol/L (ref 135–145)
Total Bilirubin: 0.6 mg/dL (ref 0.3–1.2)
Total Protein: 6.6 g/dL (ref 6.5–8.1)

## 2015-12-20 LAB — CBC WITH DIFFERENTIAL/PLATELET
BASOS PCT: 0 %
Basophils Absolute: 0 10*3/uL (ref 0–0.1)
EOS ABS: 0 10*3/uL (ref 0–0.7)
Eosinophils Relative: 0 %
HEMATOCRIT: 34.2 % — AB (ref 35.0–47.0)
HEMOGLOBIN: 12 g/dL (ref 12.0–16.0)
Lymphocytes Relative: 2 %
Lymphs Abs: 0.2 10*3/uL — ABNORMAL LOW (ref 1.0–3.6)
MCH: 34.2 pg — ABNORMAL HIGH (ref 26.0–34.0)
MCHC: 35.1 g/dL (ref 32.0–36.0)
MCV: 97.5 fL (ref 80.0–100.0)
MONOS PCT: 7 %
Monocytes Absolute: 0.6 10*3/uL (ref 0.2–0.9)
NEUTROS ABS: 8.4 10*3/uL — AB (ref 1.4–6.5)
NEUTROS PCT: 91 %
Platelets: 183 10*3/uL (ref 150–440)
RBC: 3.51 MIL/uL — ABNORMAL LOW (ref 3.80–5.20)
RDW: 14.1 % (ref 11.5–14.5)
WBC: 9.2 10*3/uL (ref 3.6–11.0)

## 2015-12-20 LAB — URINALYSIS COMPLETE WITH MICROSCOPIC (ARMC ONLY)
BACTERIA UA: NONE SEEN
BILIRUBIN URINE: NEGATIVE
Glucose, UA: NEGATIVE mg/dL
Ketones, ur: NEGATIVE mg/dL
Leukocytes, UA: NEGATIVE
NITRITE: NEGATIVE
PH: 6 (ref 5.0–8.0)
Protein, ur: 30 mg/dL — AB
Specific Gravity, Urine: 1.014 (ref 1.005–1.030)
Squamous Epithelial / LPF: NONE SEEN

## 2015-12-20 MED ORDER — OXYCODONE-ACETAMINOPHEN 5-325 MG PO TABS
1.0000 | ORAL_TABLET | Freq: Once | ORAL | Status: AC
  Administered 2015-12-20: 1 via ORAL
  Filled 2015-12-20: qty 1

## 2015-12-20 MED ORDER — CYCLOBENZAPRINE HCL 10 MG PO TABS
10.0000 mg | ORAL_TABLET | Freq: Once | ORAL | Status: AC
  Administered 2015-12-20: 10 mg via ORAL
  Filled 2015-12-20: qty 1

## 2015-12-20 NOTE — ED Notes (Signed)
Pt presents to ED with [facility]. Per EMS they were called out for psychiatric problems at the residence. Per EMS pt was talking out of her head with religious dellusions. EMS states pt has stage 4 brain cancer at this time. CBG 138, BP 165/109, HR 120. Per EMS pt finished a radiation tx on Thursday and that patient would test positive for cocaine due to a mouthwash that she uses for thrush. Pt ambulatory from EMS bay, EMS states pt had a fall earlier today, pt is voluntary at this time.

## 2015-12-20 NOTE — ED Provider Notes (Signed)
[facility] Emergency Department Provider Note   ____________________________________________  Time seen: Approximately 10:38 PM  I have reviewed the triage vital signs and the nursing notes.   HISTORY  Chief Complaint Psychiatric Evaluation    HPI Charlene [name] is a 55 y.o. female who was sent by her family. Patient was said to be exhibiting hyperreligiosity. Patient also said she was curative cancer was coming to the hospital $10. Patient comes into the hospital and while waiting for me to get finished seeing threes different septic patient's she gets inpatient herself and takes all of her clothes off and threatened to leave. This is not the act of a rational person I will commit her until psychiatry can evaluate her. I did discuss her with oncology. Oncology reports her last CT scan showed apparent complete remission of her small cell cancer. It is important to note that small cell cancer is usually not cured however. His Variable to tell the patient actually did not have any brain cancer at all. She did get prophylactic cranial radiation however. Patient also says she fell earlier and has some pain in her back. There is a very small bruise in the middle of her back. It's about the size of a dime.   Past Medical History  Diagnosis Date  . Primary cancer of right lower lobe of lung ([place]) 07/12/2015    Small cell undifferentiated carcinoma of lung.  Diagnosis at [facility] by fine-needle aspiration of lymph node (January, 2017)  . Bipolar 2 disorder ([place])   . Borderline schizophrenia   . Falls infrequently     Patient Active Problem List   Diagnosis Date Noted  . Abnormal cells of cervix 07/30/2015  . Encounter for general adult medical examination without abnormal findings 07/28/2015  . Primary cancer of right lower lobe of lung ([place]) 07/12/2015  . At risk for noncompliance 06/29/2015  . Spasm 06/26/2015  . Essential (primary) hypertension  06/21/2015  . Lung mass 06/21/2015  . Candida infection of mouth 06/21/2015  . Small cell carcinoma of lung ([place]) 06/21/2015  . Episodic mood disorder ([place]) 10/14/2014  . Chronic obstructive pulmonary disease ([place]) 10/06/2014  . Pulmonary emphysema ([place]) 10/06/2014  . LBP (low back pain) 09/27/2014  . Current tobacco use 09/27/2014    Past Surgical History  Procedure Laterality Date  . Abdominal hysterectomy    . Appendectomy    . Stomach surgery    . Nasal sinus surgery      x2  . Peripheral vascular catheterization N/A 07/30/2015    Procedure: Glori Luis Cath Insertion;  Surgeon: Algernon Huxley, MD;  Location: [place] [facility];  Service: Cardiovascular;  Laterality: N/A;    Current Outpatient Rx  Name  Route  Sig  Dispense  Refill  . albuterol (PROVENTIL HFA) 108 (90 Base) MCG/ACT inhaler               . albuterol (PROVENTIL HFA;VENTOLIN HFA) 108 (90 Base) MCG/ACT inhaler   Inhalation   Inhale into the lungs.         . benzonatate (TESSALON PERLES) 100 MG capsule   Oral   Take 100 mg by mouth.         . cyclobenzaprine (FLEXERIL) 10 MG tablet   Oral   Take 10 mg by mouth.         . dexamethasone (DECADRON) 4 MG tablet   Oral   Take 1 tablet (4 mg total) by mouth daily. 1 tab  daily until radiation is finished then 1 tab every other day until finished   30 tablet   0   . dexamethasone (DECADRON) 4 MG tablet   Oral   Take 1 tablet (4 mg total) by mouth 2 (two) times daily with a meal. Please use [employer]   35 tablet   0   . diclofenac (VOLTAREN) 75 MG EC tablet   Oral   Take 75 mg by mouth.         . Diphenhyd-Hydrocort-Nystatin (FIRST-DUKES MOUTHWASH) SUSP      Take 9m by mouth, swish and swallow three times a day   237 mL   3   . doxycycline (VIBRAMYCIN) 100 MG capsule   Oral   Take 1 capsule (100 mg total) by mouth 2 (two) times daily.   14 capsule   0   . DULoxetine (CYMBALTA) 60 MG capsule   Oral   Take 60 mg by mouth. Reported on  11/01/2015         . fluconazole (DIFLUCAN) 100 MG tablet   Oral   Take 1 tablet (100 mg total) by mouth daily.   5 tablet   9     Approved for BMcDonald[employer] Corporationby [employer] SW.   . fluticasone (FLONASE) 50 MCG/ACT nasal spray      1 spray by Each Nare route daily.         .Marland KitchenguaiFENesin-codeine 100-10 MG/5ML syrup   Oral   Take 5 mLs by mouth 3 (three) times daily as needed for cough.   240 mL   0   . lidocaine (ASPERCREME W/LIDOCAINE) 4 % cream      Apply 2 g topically Three (3) times a day as needed.         . lidocaine (XYLOCAINE) 5 % ointment   Topical   Apply topically.         . lidocaine-prilocaine (EMLA) cream   Topical   Apply 1 application topically as needed.   30 g   3   . loratadine (CLARITIN) 10 MG tablet   Oral   Take 10 mg by mouth.         .Marland KitchenLORazepam (ATIVAN) 1 MG tablet      Take 1 tablet by mouth once 1 hour prior to scan   1 tablet   0   . nystatin (MYCOSTATIN) 100000 UNIT/ML suspension   Oral   Take by mouth. Reported on 11/01/2015         . ondansetron (ZOFRAN) 4 MG tablet   Oral   Take 4 mg by mouth.         . oxyCODONE-acetaminophen (PERCOCET) 7.5-325 MG tablet   Oral   Take 1 tablet by mouth every 6 (six) hours as needed for severe pain.   40 tablet   0   . prochlorperazine (COMPAZINE) 10 MG tablet   Oral   Take 1 tablet (10 mg total) by mouth every 6 (six) hours as needed for nausea or vomiting.   15 tablet   0   . Saline (AYR SALINE NASAL DROPS) 0.65 % (Soln) SOLN      2 drops by Each Nare route every four (4) hours as needed.         . sucralfate (CARAFATE) 1 g tablet   Oral   Take 1 tablet (1 g total) by mouth 4 (four) times daily. Dissolve in 2-3 tbsp warm water, swish and swallow.   120 tablet  3     Allergies Amoxicillin-pot clavulanate and Septra  No family history on file.  Social History Social History  Substance Use Topics  . Smoking status: Former Smoker -- 0.50 packs/day for 30 years      Quit date: 06/17/2015  . Smokeless tobacco: None  . Alcohol Use: No    Review of Systems Constitutional: No fever/chills Eyes: No visual changes. ENT: No sore throat. Cardiovascular: Denies chest pain. Respiratory: Denies shortness of breath. Gastrointestinal: No abdominal pain.  No nausea, no vomiting.  No diarrhea.  No constipation. Genitourinary: Negative for dysuria. Musculoskeletal: See history of present illness Skin: Negative for rash. Neurological: Negative for headaches, focal weakness or numbness.  10-point ROS otherwise negative.  ____________________________________________   PHYSICAL EXAM:  VITAL SIGNS: ED Triage Vitals  Enc Vitals Group     BP 12/20/15 1855 172/106 mmHg     Pulse Rate 12/20/15 1855 117     Resp 12/20/15 1855 20     Temp 12/20/15 1855 99.2 F (37.3 C)     Temp Source 12/20/15 1855 Oral     SpO2 12/20/15 1855 96 %     Weight 12/20/15 1855 149 lb (67.586 kg)     Height 12/20/15 1855 '5\' 8"'$  (1.727 m)     Head Cir --      Peak Flow --      Pain Score 12/20/15 1901 9     Pain Loc --      Pain Edu? --      Excl. in [place]? --     Constitutional: Alert and oriented. Well appearing and in no acute distress. Eyes: Conjunctivae are normal. PERRL. EOMI. Head: Atraumatic. Nose: No congestion/rhinnorhea. Mouth/Throat: Mucous membranes are moist.  Oropharynx non-erythematous. Neck: No stridor.   Cardiovascular: Normal rate, regular rhythm. Grossly normal heart sounds.  Good peripheral circulation. Respiratory: Normal respiratory effort.  No retractions. Lungs CTAB. Gastrointestinal: Soft and nontender. No distention. No abdominal bruits. No CVA tenderness. Musculoskeletal: No lower extremity tenderness nor edema.  No joint effusions.There is a small dime-sized bruise in the back somewhat tender. Neurologic:  Normal speech and language. No gross focal neurologic deficits are appreciated. No gait instability. Skin:  Skin is warm, dry and intact. No  rash noted. Psychiatric: Mood and affect are normal. Speech and behavior are normal.  ____________________________________________   LABS (all labs ordered are listed, but only abnormal results are displayed)  Labs Reviewed  COMPREHENSIVE METABOLIC PANEL - Abnormal; Notable for the following:    Chloride 98 (*)    Glucose, Bld 144 (*)    AST 108 (*)    ALT 59 (*)    All other components within normal limits  CBC WITH DIFFERENTIAL/PLATELET - Abnormal; Notable for the following:    RBC 3.51 (*)    HCT 34.2 (*)    MCH 34.2 (*)    Neutro Abs 8.4 (*)    Lymphs Abs 0.2 (*)    All other components within normal limits  URINALYSIS COMPLETEWITH MICROSCOPIC ([facility] ONLY) - Abnormal; Notable for the following:    Color, Urine YELLOW (*)    APPearance CLEAR (*)    Hgb urine dipstick 2+ (*)    Protein, ur 30 (*)    All other components within normal limits   ____________________________________________  EKG   ____________________________________________  RADIOLOGY  Chest x-ray shows no acute disease ____________________________________________   PROCEDURES    Procedures   ____________________________________________   INITIAL IMPRESSION / ASSESSMENT AND PLAN / ED COURSE  Pertinent labs & imaging results that were available during my care of the patient were reviewed by me and considered in my medical decision making (see chart for details).   ____________________________________________   FINAL CLINICAL IMPRESSION(S) / ED DIAGNOSES  Final diagnoses:  Bipolar 1 disorder ([place])      NEW MEDICATIONS STARTED DURING THIS VISIT:  New Prescriptions   No medications on file     Note:  This document was prepared using Dragon voice recognition software and may include unintentional dictation errors.    Nena Polio, MD 12/21/15 [phone]

## 2015-12-20 NOTE — ED Notes (Addendum)
Pt threaten to leave now, taken paper scrubs off, naked. Dr. Cinda Quest notified, states pt will be IVC, do not let pt leave. Security notified.

## 2015-12-20 NOTE — Telephone Encounter (Signed)
Mother of patient Mrs. Charlene [name]. Mother of patient reports that patient is very combative. Pt is hallucinating and in manic state per pt's mother.. Her mother states that "my daughter keeps telling me my back yard is a car lot.  Then the next second, she states I'm going to hurt myself.  Then the next moment she is talking about going to the court house to change her last name."  Mother of pt states that she feels that the patient needs to be evaluated asap by Dr. Tish Men. "I worry that the cancer is causing all this hallucination and manic state.  I don't feel safe around her right now. I think she could hurt herself or someone else. She throws objects.  Just now she just took off outside. I don't even know where she is. She keeps telling me she doesn't want to go to any of her medical appointments and that she doesn't need to come back to the doctor for her cancer."  I encourage patient's mom to contact 911 or take her daughter ED. This is the best way to get patient's symptoms evaluated at this time.

## 2015-12-20 NOTE — ED Notes (Signed)
Pt states she is here because her family thinks she is crazy and she is here to prove them wrong. Pt states she walked out of [facility] where she was dx with stage 4 brain and lung cancer and she walked out last Thursday and she was healed. Pt states she gave it all to God and he healed her. Pt states she is going to give [facility] $10,000,0000.

## 2015-12-20 NOTE — BH Assessment (Signed)
Assessment Note  Charlene ENSEY is an 55 y.o. female presenting to ED via [facility]. Pt has Stage 4 cancer.  Per EMS they were called out for psychiatric problems at the residence. Pt was talking out of her head with religious dellusions. Pt reports that she no longer needs medications or chemotherapy because God has cured her of her cancer.  Pt presents as delusional and was difficult to engage in assessment.  Diagnosis: Bipolar disorder  Past Medical History:  Past Medical History  Diagnosis Date  . Primary cancer of right lower lobe of lung ([place]) 07/12/2015    Small cell undifferentiated carcinoma of lung.  Diagnosis at [facility] by fine-needle aspiration of lymph node (January, 2017)  . Bipolar 2 disorder ([place])   . Borderline schizophrenia   . Falls infrequently     Past Surgical History  Procedure Laterality Date  . Abdominal hysterectomy    . Appendectomy    . Stomach surgery    . Nasal sinus surgery      x2  . Peripheral vascular catheterization N/A 07/30/2015    Procedure: Glori Luis Cath Insertion;  Surgeon: Algernon Huxley, MD;  Location: [facility];  Service: Cardiovascular;  Laterality: N/A;    Family History: No family history on file.  Social History:  reports that she quit smoking about 6 months ago. She does not have any smokeless tobacco history on file. She reports that she uses illicit drugs (Cocaine). She reports that she does not drink alcohol.  Additional Social History:  Alcohol / Drug Use History of alcohol / drug use?: No history of alcohol / drug abuse  CIWA: CIWA-Ar BP: (!) 172/106 mmHg Pulse Rate: (!) 117 COWS:    Allergies:  Allergies  Allergen Reactions  . Amoxicillin-Pot Clavulanate Hives    Also vomiting  . Septra [Sulfamethoxazole-Trimethoprim] Hives and Nausea And Vomiting    Home Medications:  (Not in a hospital admission)  OB/GYN Status:  No LMP recorded.  General Assessment Data Location of Assessment: [facility] ED TTS  Assessment: In system Is this a Tele or Face-to-Face Assessment?: Face-to-Face Is this an Initial Assessment or a Re-assessment for this encounter?: Initial Assessment Marital status: Single Maiden name: n/a Is patient pregnant?: No Pregnancy Status: No Living Arrangements: Parent Can pt return to current living arrangement?: Yes Admission Status: Voluntary Is patient capable of signing voluntary admission?: No Referral Source: Self/Family/Friend Insurance type: none  Medical Screening Exam ([place]) Medical Exam completed: Yes  Crisis Care Plan Living Arrangements: Parent Legal Guardian: Other: (self) Name of Psychiatrist: n/a Name of Therapist: n/a  Education Status Is patient currently in school?: No Current Grade: n/a Highest grade of school patient has completed: n/a Name of school: n/a Contact person: n/a  Risk to self with the past 6 months Suicidal Ideation: No Has patient been a risk to self within the past 6 months prior to admission? : No Suicidal Intent: No Has patient had any suicidal intent within the past 6 months prior to admission? : No Is patient at risk for suicide?: No Suicidal Plan?: No Has patient had any suicidal plan within the past 6 months prior to admission? : No Access to Means: No What has been your use of drugs/alcohol within the last 12 months?: none identified Previous Attempts/Gestures: No Other Self Harm Risks: none identified Triggers for Past Attempts: None known Intentional Self Injurious Behavior: None Family Suicide History: Unknown Recent stressful life event(s): Recent negative physical changes Persecutory voices/beliefs?: No  Depression: Yes Depression Symptoms: Loss of interest in usual pleasures, Tearfulness Substance abuse history and/or treatment for substance abuse?: No Suicide prevention information given to non-admitted patients: Not applicable  Risk to Others within the past 6 months Homicidal Ideation:  No Does patient have any lifetime risk of violence toward others beyond the six months prior to admission? : No Thoughts of Harm to Others: No Current Homicidal Intent: No Current Homicidal Plan: No Access to Homicidal Means: No Identified Victim: n/a History of harm to others?: No Assessment of Violence: None Noted Violent Behavior Description: n/a Does patient have access to weapons?: No Criminal Charges Pending?: No Does patient have a court date: No Is patient on probation?: No  Psychosis Hallucinations: None noted Delusions: Unspecified  Mental Status Report Appearance/Hygiene: In scrubs Eye Contact: Poor Motor Activity: Shuffling Speech: Incoherent Level of Consciousness: Restless Mood: Preoccupied Affect: Sad Anxiety Level: Minimal Thought Processes: Flight of Ideas Judgement: Unable to Assess Orientation: Person, Place Obsessive Compulsive Thoughts/Behaviors: Minimal  Cognitive Functioning Concentration: Unable to Assess Memory: Unable to Assess IQ: Average Insight: Unable to Assess Impulse Control: Unable to Assess Appetite: Poor Weight Loss: 0 Weight Gain: 0 Sleep: Unable to Assess Vegetative Symptoms: Unable to Assess  ADLScreening [facility] Assessment Services) Patient's cognitive ability adequate to safely complete daily activities?: Yes Patient able to express need for assistance with ADLs?: Yes Independently performs ADLs?: Yes (appropriate for developmental age)  Prior Inpatient Therapy Prior Inpatient Therapy: No Prior Therapy Dates: n/a Prior Therapy Facilty/Provider(s): n/a Reason for Treatment: n/a  Prior Outpatient Therapy Prior Outpatient Therapy: No Prior Therapy Dates: n/a Prior Therapy Facilty/Provider(s): n/a Reason for Treatment: n/a Does patient have an ACCT team?: No Does patient have Intensive In-House Services?  : No Does patient have [facility] services? : No Does patient have P4CC services?: No  ADL Screening (condition at  time of admission) Patient's cognitive ability adequate to safely complete daily activities?: Yes Patient able to express need for assistance with ADLs?: Yes Independently performs ADLs?: Yes (appropriate for developmental age)       Abuse/Neglect Assessment (Assessment to be complete while patient is alone) Physical Abuse: Denies Verbal Abuse: Denies Sexual Abuse: Denies Exploitation of patient/patient's resources: Denies Self-Neglect: Denies Values / Beliefs Cultural Requests During Hospitalization: None Spiritual Requests During Hospitalization: None Consults Spiritual Care Consult Needed: No Social Work Consult Needed: No Regulatory affairs officer (For Healthcare) Does patient have an advance directive?: No Would patient like information on creating an advanced directive?: No - patient declined information    Additional Information 1:1 In Past 12 Months?: No CIRT Risk: No Elopement Risk: No Does patient have medical clearance?: Yes     Disposition:  Disposition Initial Assessment Completed for this Encounter: Yes Disposition of Patient: Other dispositions Other disposition(s): Other (Comment) (Pending Psych MD Consult)  On Site Evaluation by:   Reviewed with Physician:    Oneita Hurt 12/20/2015 9:44 PM

## 2015-12-21 ENCOUNTER — Encounter: Payer: Self-pay | Admitting: Psychiatry

## 2015-12-21 ENCOUNTER — Inpatient Hospital Stay
Admission: RE | Admit: 2015-12-21 | Discharge: 2015-12-30 | DRG: 885 | Disposition: A | Discharge status: Other Acute Inpatient Hospital | Payer: Medicaid Other | Source: Intra-hospital | Attending: Psychiatry | Admitting: Psychiatry

## 2015-12-21 DIAGNOSIS — J449 Chronic obstructive pulmonary disease, unspecified: Secondary | ICD-10-CM | POA: Diagnosis present

## 2015-12-21 DIAGNOSIS — G4700 Insomnia, unspecified: Secondary | ICD-10-CM | POA: Diagnosis present

## 2015-12-21 DIAGNOSIS — R0902 Hypoxemia: Secondary | ICD-10-CM | POA: Diagnosis present

## 2015-12-21 DIAGNOSIS — C3490 Malignant neoplasm of unspecified part of unspecified bronchus or lung: Secondary | ICD-10-CM | POA: Diagnosis present

## 2015-12-21 DIAGNOSIS — R7981 Abnormal blood-gas level: Secondary | ICD-10-CM

## 2015-12-21 DIAGNOSIS — C3431 Malignant neoplasm of lower lobe, right bronchus or lung: Secondary | ICD-10-CM | POA: Diagnosis present

## 2015-12-21 DIAGNOSIS — F068 Other specified mental disorders due to known physiological condition: Secondary | ICD-10-CM | POA: Diagnosis present

## 2015-12-21 DIAGNOSIS — Z9889 Other specified postprocedural states: Secondary | ICD-10-CM | POA: Diagnosis not present

## 2015-12-21 DIAGNOSIS — A419 Sepsis, unspecified organism: Secondary | ICD-10-CM | POA: Diagnosis not present

## 2015-12-21 DIAGNOSIS — Z888 Allergy status to other drugs, medicaments and biological substances status: Secondary | ICD-10-CM | POA: Diagnosis not present

## 2015-12-21 DIAGNOSIS — D649 Anemia, unspecified: Secondary | ICD-10-CM | POA: Diagnosis not present

## 2015-12-21 DIAGNOSIS — I10 Essential (primary) hypertension: Secondary | ICD-10-CM | POA: Diagnosis present

## 2015-12-21 DIAGNOSIS — F312 Bipolar disorder, current episode manic severe with psychotic features: Secondary | ICD-10-CM | POA: Diagnosis not present

## 2015-12-21 DIAGNOSIS — F429 Obsessive-compulsive disorder, unspecified: Secondary | ICD-10-CM | POA: Diagnosis present

## 2015-12-21 DIAGNOSIS — F1420 Cocaine dependence, uncomplicated: Secondary | ICD-10-CM | POA: Diagnosis present

## 2015-12-21 DIAGNOSIS — F17210 Nicotine dependence, cigarettes, uncomplicated: Secondary | ICD-10-CM | POA: Diagnosis present

## 2015-12-21 DIAGNOSIS — F3189 Other bipolar disorder: Secondary | ICD-10-CM | POA: Diagnosis not present

## 2015-12-21 DIAGNOSIS — Z90710 Acquired absence of both cervix and uterus: Secondary | ICD-10-CM

## 2015-12-21 DIAGNOSIS — Z818 Family history of other mental and behavioral disorders: Secondary | ICD-10-CM

## 2015-12-21 DIAGNOSIS — L03113 Cellulitis of right upper limb: Secondary | ICD-10-CM | POA: Diagnosis present

## 2015-12-21 DIAGNOSIS — G8929 Other chronic pain: Secondary | ICD-10-CM | POA: Diagnosis present

## 2015-12-21 DIAGNOSIS — Z79899 Other long term (current) drug therapy: Secondary | ICD-10-CM | POA: Diagnosis not present

## 2015-12-21 DIAGNOSIS — K219 Gastro-esophageal reflux disease without esophagitis: Secondary | ICD-10-CM | POA: Diagnosis present

## 2015-12-21 DIAGNOSIS — J189 Pneumonia, unspecified organism: Secondary | ICD-10-CM | POA: Diagnosis not present

## 2015-12-21 DIAGNOSIS — F4310 Post-traumatic stress disorder, unspecified: Secondary | ICD-10-CM | POA: Diagnosis present

## 2015-12-21 DIAGNOSIS — Z8249 Family history of ischemic heart disease and other diseases of the circulatory system: Secondary | ICD-10-CM

## 2015-12-21 DIAGNOSIS — Z9049 Acquired absence of other specified parts of digestive tract: Secondary | ICD-10-CM

## 2015-12-21 DIAGNOSIS — C7931 Secondary malignant neoplasm of brain: Secondary | ICD-10-CM | POA: Diagnosis present

## 2015-12-21 DIAGNOSIS — Z881 Allergy status to other antibiotic agents status: Secondary | ICD-10-CM | POA: Diagnosis not present

## 2015-12-21 DIAGNOSIS — B370 Candidal stomatitis: Secondary | ICD-10-CM | POA: Diagnosis present

## 2015-12-21 DIAGNOSIS — F319 Bipolar disorder, unspecified: Secondary | ICD-10-CM | POA: Diagnosis not present

## 2015-12-21 DIAGNOSIS — Z923 Personal history of irradiation: Secondary | ICD-10-CM

## 2015-12-21 LAB — URINE DRUG SCREEN, QUALITATIVE (ARMC ONLY)
Amphetamines, Ur Screen: NOT DETECTED
BARBITURATES, UR SCREEN: NOT DETECTED
BENZODIAZEPINE, UR SCRN: POSITIVE — AB
COCAINE METABOLITE, UR ~~LOC~~: POSITIVE — AB
Cannabinoid 50 Ng, Ur ~~LOC~~: NOT DETECTED
MDMA (Ecstasy)Ur Screen: NOT DETECTED
METHADONE SCREEN, URINE: NOT DETECTED
OPIATE, UR SCREEN: NOT DETECTED
PHENCYCLIDINE (PCP) UR S: NOT DETECTED
Tricyclic, Ur Screen: POSITIVE — AB

## 2015-12-21 MED ORDER — ALBUTEROL SULFATE HFA 108 (90 BASE) MCG/ACT IN AERS
2.0000 | INHALATION_SPRAY | RESPIRATORY_TRACT | Status: DC | PRN
  Administered 2015-12-21: 2 via RESPIRATORY_TRACT
  Filled 2015-12-21: qty 6.7

## 2015-12-21 MED ORDER — OXYCODONE-ACETAMINOPHEN 7.5-325 MG PO TABS
1.0000 | ORAL_TABLET | Freq: Four times a day (QID) | ORAL | Status: DC | PRN
  Administered 2015-12-21 – 2015-12-30 (×21): 1 via ORAL
  Filled 2015-12-21 (×22): qty 1

## 2015-12-21 MED ORDER — ALBUTEROL SULFATE HFA 108 (90 BASE) MCG/ACT IN AERS
2.0000 | INHALATION_SPRAY | RESPIRATORY_TRACT | Status: DC | PRN
  Administered 2015-12-21 – 2015-12-27 (×4): 2 via RESPIRATORY_TRACT
  Filled 2015-12-21: qty 6.7

## 2015-12-21 MED ORDER — LORAZEPAM 1 MG PO TABS
1.0000 mg | ORAL_TABLET | Freq: Once | ORAL | Status: AC
  Administered 2015-12-21: 1 mg via ORAL
  Filled 2015-12-21 (×2): qty 1

## 2015-12-21 MED ORDER — GUAIFENESIN-DM 100-10 MG/5ML PO SYRP
5.0000 mL | ORAL_SOLUTION | ORAL | Status: DC | PRN
  Administered 2015-12-21 – 2015-12-30 (×7): 5 mL via ORAL
  Filled 2015-12-21 (×9): qty 5

## 2015-12-21 MED ORDER — DICLOFENAC SODIUM 25 MG PO TBEC
75.0000 mg | DELAYED_RELEASE_TABLET | Freq: Two times a day (BID) | ORAL | Status: DC
  Administered 2015-12-21 – 2015-12-30 (×19): 75 mg via ORAL
  Filled 2015-12-21 (×2): qty 3
  Filled 2015-12-21 (×2): qty 1
  Filled 2015-12-21: qty 3
  Filled 2015-12-21: qty 1
  Filled 2015-12-21 (×6): qty 3
  Filled 2015-12-21: qty 1
  Filled 2015-12-21: qty 3
  Filled 2015-12-21: qty 1
  Filled 2015-12-21 (×2): qty 3
  Filled 2015-12-21: qty 1
  Filled 2015-12-21: qty 3
  Filled 2015-12-21: qty 1
  Filled 2015-12-21 (×2): qty 3

## 2015-12-21 MED ORDER — ACETAMINOPHEN 500 MG PO TABS
1000.0000 mg | ORAL_TABLET | Freq: Once | ORAL | Status: AC
  Administered 2015-12-21: 1000 mg via ORAL
  Filled 2015-12-21: qty 2

## 2015-12-21 MED ORDER — FLUTICASONE PROPIONATE 50 MCG/ACT NA SUSP
2.0000 | Freq: Every day | NASAL | Status: DC
  Administered 2015-12-23 – 2015-12-30 (×8): 2 via NASAL
  Filled 2015-12-21 (×2): qty 16

## 2015-12-21 MED ORDER — RISPERIDONE 1 MG PO TABS
2.0000 mg | ORAL_TABLET | Freq: Two times a day (BID) | ORAL | Status: DC
  Administered 2015-12-21 – 2015-12-30 (×18): 2 mg via ORAL
  Filled 2015-12-21 (×18): qty 2

## 2015-12-21 MED ORDER — LORATADINE 10 MG PO TABS
10.0000 mg | ORAL_TABLET | Freq: Every day | ORAL | Status: DC
  Administered 2015-12-22 – 2015-12-30 (×9): 10 mg via ORAL
  Filled 2015-12-21 (×9): qty 1

## 2015-12-21 MED ORDER — LIDOCAINE 5 % EX PTCH
1.0000 | MEDICATED_PATCH | CUTANEOUS | Status: DC
  Administered 2015-12-21: 1 via TRANSDERMAL
  Filled 2015-12-21: qty 1

## 2015-12-21 MED ORDER — OXYCODONE-ACETAMINOPHEN 7.5-325 MG PO TABS
1.0000 | ORAL_TABLET | Freq: Four times a day (QID) | ORAL | Status: DC | PRN
  Administered 2015-12-21 (×2): 1 via ORAL
  Filled 2015-12-21 (×2): qty 1

## 2015-12-21 MED ORDER — MAGIC MOUTHWASH W/LIDOCAINE
10.0000 mL | Freq: Four times a day (QID) | ORAL | Status: DC | PRN
  Filled 2015-12-21: qty 10

## 2015-12-21 MED ORDER — DIVALPROEX SODIUM 500 MG PO DR TAB
500.0000 mg | DELAYED_RELEASE_TABLET | Freq: Three times a day (TID) | ORAL | Status: DC
  Administered 2015-12-21 – 2015-12-24 (×8): 500 mg via ORAL
  Filled 2015-12-21 (×8): qty 1

## 2015-12-21 MED ORDER — CYCLOBENZAPRINE HCL 10 MG PO TABS
10.0000 mg | ORAL_TABLET | Freq: Every day | ORAL | Status: DC
  Administered 2015-12-21: 10 mg via ORAL
  Filled 2015-12-21 (×2): qty 1

## 2015-12-21 MED ORDER — FLUCONAZOLE 100 MG PO TABS
100.0000 mg | ORAL_TABLET | Freq: Every day | ORAL | Status: DC
  Administered 2015-12-22 – 2015-12-30 (×9): 100 mg via ORAL
  Filled 2015-12-21 (×9): qty 1

## 2015-12-21 MED ORDER — SUCRALFATE 1 G PO TABS
1.0000 g | ORAL_TABLET | Freq: Three times a day (TID) | ORAL | Status: DC
  Administered 2015-12-21 – 2015-12-30 (×36): 1 g via ORAL
  Filled 2015-12-21 (×38): qty 1

## 2015-12-21 MED ORDER — CYCLOBENZAPRINE HCL 10 MG PO TABS
10.0000 mg | ORAL_TABLET | Freq: Every day | ORAL | Status: DC
  Administered 2015-12-22: 10 mg via ORAL
  Filled 2015-12-21: qty 1

## 2015-12-21 MED ORDER — CYCLOBENZAPRINE HCL 10 MG PO TABS
10.0000 mg | ORAL_TABLET | Freq: Once | ORAL | Status: AC
  Administered 2015-12-21: 10 mg via ORAL

## 2015-12-21 MED ORDER — NYSTATIN 100000 UNIT/ML MT SUSP
5.0000 mL | Freq: Four times a day (QID) | OROMUCOSAL | Status: DC
  Administered 2015-12-21 – 2015-12-30 (×34): 500000 [IU] via ORAL
  Filled 2015-12-21 (×6): qty 5
  Filled 2015-12-21: qty 60
  Filled 2015-12-21 (×5): qty 5
  Filled 2015-12-21: qty 60
  Filled 2015-12-21 (×7): qty 5
  Filled 2015-12-21: qty 60
  Filled 2015-12-21 (×6): qty 5
  Filled 2015-12-21: qty 60
  Filled 2015-12-21 (×8): qty 5

## 2015-12-21 MED ORDER — FLUVOXAMINE MALEATE 50 MG PO TABS
50.0000 mg | ORAL_TABLET | Freq: Every day | ORAL | Status: DC
  Administered 2015-12-21: 50 mg via ORAL
  Filled 2015-12-21: qty 1

## 2015-12-21 MED ORDER — GUAIFENESIN-DM 100-10 MG/5ML PO SYRP
5.0000 mL | ORAL_SOLUTION | ORAL | Status: DC | PRN
  Administered 2015-12-21: 5 mL via ORAL
  Filled 2015-12-21: qty 5

## 2015-12-21 NOTE — ED Notes (Signed)
Went into pt room to give Robitussin DM, pt is asleep on R side, rise and fall of chest noted. This RN knocked, pt named called, pt did not wake up. When pt wakes up and if she asks for medicine then medicine will be available.

## 2015-12-21 NOTE — ED Notes (Signed)
Pt ambulatory to toilet

## 2015-12-21 NOTE — ED Notes (Signed)
Pt c/o of L shoulder and L chest pain stating that she fell "really hard yesterday" and that she has arthritis and a cold/cough and would like some medicine for cough that will help her sleep because her shoulder pain is making it uncomfortable to sleep. Pt asked if she is still wearing lidocaine patch, she stated "yes but it is not working"

## 2015-12-21 NOTE — ED Notes (Signed)
Pt stated she was cold, this RN took an old blanket away and brought the pt a new warm blanket and placed it closest to the pt. Pt verbalized comfort.

## 2015-12-21 NOTE — ED Notes (Signed)
Pt ambulatory to bathroom at this time w/o incident.

## 2015-12-21 NOTE — ED Notes (Signed)
BEHAVIORAL HEALTH ROUNDING Patient sleeping: No. Patient alert and oriented: yes Behavior appropriate: Yes.  ; If no, describe:  Nutrition and fluids offered: yes Toileting and hygiene offered: Yes  Sitter present: q15 minute observations and security  monitoring Law enforcement present: Yes  ODS  

## 2015-12-21 NOTE — ED Notes (Signed)
Dr Mamie Nick is in consulting with her at this time

## 2015-12-21 NOTE — Progress Notes (Signed)
Patient new admit to unit. Patient alert and oriented x 4, hyperverbal and anxious. Patient skin check with no contraband. Patient skin check with multiple ecchymotic areas to arms and legs. One large ecchymotic area to left hip. One small ecchymotic area to mid abdominal area. Patient states she is a fall risk. Placed on fall risk precaution and patient non compliant. Soft dinner food ordered. Patient ambulating unit. Safety maintained at this time.

## 2015-12-21 NOTE — ED Notes (Signed)
Pt can be heard yelling and moaning  - "I am having spasms - I take the flexeril 6-8 times a day and you have not given me but one all day."  i tried to explain to her that I can only administered what is ordered and she began screaming in my face that she knows what medicine she is supposed to have and I am just a "bad nurse"  Pt requesting to talk with pt relations

## 2015-12-21 NOTE — [facility] Group Notes (Signed)
[facility] Group Notes:  (Nursing/MHT/Case Management/Adjunct)  Date:  12/21/2015  Time:  11:28 PM  Type of Therapy:  Evening Wrap-up Group  Participation Level:  Did Not Attend  Participation Quality:  N/A  Affect:  N/A  Cognitive:  N/A  Insight:  None  Engagement in Group:  N/A  Modes of Intervention:  Discussion  Summary of Progress/Problems:  Levonne Spiller 12/21/2015, 11:28 PM

## 2015-12-21 NOTE — ED Notes (Signed)

## 2015-12-21 NOTE — ED Notes (Signed)
Pt ambulatory to bathroom at this time, steady gait noted.

## 2015-12-21 NOTE — ED Notes (Signed)
Attempted to call report - I spoke with Estill Bamberg and she stated that the nurse that is accepting this pt has stepped off the unit and she will have to call me back - I will continue to monitor the pt and await the return call

## 2015-12-21 NOTE — ED Notes (Signed)
Received a call from Charlene [name] pts father  He was requesting an update  Pt resting quietly with her eyes closed at this time  Father informed  - I will have the pt call him whenever she is awake

## 2015-12-21 NOTE — ED Notes (Signed)
She has been pacing in her room - opening and closing her door and talking   "I have taken care of everybody and kept the house so clean  - i cook for my family and now they say that I am crazy - i am not crazy I just have a broken heart and nobody looks after me but me.  I am tired."

## 2015-12-21 NOTE — ED Notes (Signed)
Phone provided to her -  Pt to be admitted to [facility] bed assignment   Pt observed with no unusual behavior  Appropriate to stimulation  No verbalized needs or concerns at this time  NAD assessed  Continue to monitor

## 2015-12-21 NOTE — ED Notes (Signed)
Pt can be heard moaning from her room during report

## 2015-12-21 NOTE — ED Notes (Signed)
Called lab to make sure they still had urine to run UDS, they reported they did, it was on spinner, and that results should be back in about 20 minutes or so.

## 2015-12-21 NOTE — Tx Team (Signed)
Initial Interdisciplinary Treatment Plan   PATIENT STRESSORS: Health problems Substance abuse   PATIENT STRENGTHS: Capable of independent living Communication skills   PROBLEM LIST: Problem List/Patient Goals Date to be addressed Date deferred Reason deferred Estimated date of resolution  Bipolar  7/7           Substance abuse (cocaine) 7/7                                          DISCHARGE CRITERIA:  Ability to meet basic life and health needs Adequate post-discharge living arrangements Improved stabilization in mood, thinking, and/or behavior  PRELIMINARY DISCHARGE PLAN: Attend aftercare/continuing care group Return to previous living arrangement  PATIENT/FAMIILY INVOLVEMENT: This treatment plan has been presented to and reviewed with the patient, Charlene [name], and/or family member, .  The patient and family have been given the opportunity to ask questions and make suggestions.  Charlene [name] 12/21/2015, 5:43 PM

## 2015-12-21 NOTE — Consult Note (Signed)
  Charlene [name] meets criteria for inpatient admission.  I will do admission orders.  Full note to follow.

## 2015-12-21 NOTE — ED Notes (Signed)
I have introduced myself to her  - pt reports 10/10 pain in her side, back shoulder and chest  Pt reports experiencing a fall and she is sore  I have called pharmacy for her PRN pain med  Pt in agreement to await med

## 2015-12-21 NOTE — ED Notes (Signed)
Informed by TTS that the pt has been assigned to bed 306 and I may call report at [phone]

## 2015-12-21 NOTE — ED Notes (Signed)
Pt given breakfast tray

## 2015-12-21 NOTE — ED Notes (Signed)
Received a call back from [facility]  Report given  Pt to transfer at this time

## 2015-12-21 NOTE — Consult Note (Addendum)
[place] Psychiatry Consult   Reason for Consult:  Psychotic break Referring Physician:  Dr. Karma Greaser Patient Identification: LINDALEE HUIZINGA MRN:  [id number] Principal Diagnosis: Bipolar I disorder, most recent episode manic, severe with psychotic features [facility]) Diagnosis:   Patient Active Problem List   Diagnosis Date Noted  . Acute psychosis associated with endocrine, metabolic, or cerebrovascular disorder [F06.8, F01.50] 12/21/2015  . Cocaine use disorder, moderate, dependence ([place]) [F14.20] 12/21/2015  . Bipolar I disorder, most recent episode manic, severe with psychotic features ([place]) [F31.2] 12/21/2015  . OCD (obsessive compulsive disorder) [F42.9] 12/21/2015  . PTSD (post-traumatic stress disorder) [F43.10] 12/21/2015  . Abnormal cells of cervix [N87.9] 07/30/2015  . Encounter for general adult medical examination without abnormal findings [Z00.00] 07/28/2015  . Primary cancer of right lower lobe of lung ([place]) [C34.31] 07/12/2015  . At risk for noncompliance [Z87.898] 06/29/2015  . Spasm [R25.2] 06/26/2015  . Essential (primary) hypertension [I10] 06/21/2015  . Lung mass [R91.8] 06/21/2015  . Candida infection of mouth [B37.0] 06/21/2015  . Small cell carcinoma of lung ([place]) [C34.90] 06/21/2015  . Episodic mood disorder ([place]) [F39] 10/14/2014  . Chronic obstructive pulmonary disease ([place]) [J44.9] 10/06/2014  . Pulmonary emphysema ([place]) [J43.9] 10/06/2014  . LBP (low back pain) [M54.5] 09/27/2014  . Tobacco use disorder [F17.200] 09/27/2014    Total Time spent with patient: 1 hour  Subjective:    Identifying data. TEEGAN GUINTHER is a 55 y.o. female with a history of bipolar disorder and disseminated smal cell lung carcinoma.   Chief complaint. "I need to get back on my medications."  History of present illness. Information was obtained from the patient and the chart. The patient has a long history of bipolar disorder but has not been treated for it recently as  she has been receiving cancer treatment. In reviewing the notes of Dr. Oliva Bustard, her oncologist, it appears that the patient has been having behavioral problems and mood swings all along. She has been encouraged on multiple occasions psychiatric help. She was brought to the emergency room for agitated, combative behavior and disorganized, psychotic thinking. The patient claims that she herself was seeking help. The patient reports recent increase in energy. She's been cleaning and organizing all night long unable to sleep. She became grandiose and delusional. She believes that this person in energy is related to the fact that she has been cured of cancer. We understand from the chart and the patient that she just completed radiation therapy of the brain and dexamethasone treatment which could have been contributing factors. The patient denies symptoms of depression. She denies symptoms of psychosis and does not think that she is in a manic episode. She reports symptoms of PTSD and OCD. She adamantly denies alcohol or illicit substance use but her drug screen was positive for cocaine. She admitted to one of the nurses to using cocaine. 2 other people she claimed that cocaine came from [facility] mouthwash.  Past psychiatric history. The patient has a long history of bipolar illness. She was hospitalized 8 or 10 times in the remote past. She attempted suicide twice by overdose "a long time ago." She has been treated with a combination of Depakote, lithium, Cymbalta, and Risperdal.  Family psychiatric history. Daughter with bipolar.  Social history. 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 She applied for disability and hopes to receive her first check very shortly. She believes that she will find an apartment get driver's license and a car and be independent.  At the moment she lives with her parents.  Risk to Self: Suicidal Ideation: No Suicidal Intent: No Is patient at risk for suicide?: No Suicidal Plan?:  No Access to Means: No What has been your use of drugs/alcohol within the last 12 months?: none identified Other Self Harm Risks: none identified Triggers for Past Attempts: None known Intentional Self Injurious Behavior: None Risk to Others: Homicidal Ideation: No Thoughts of Harm to Others: No Current Homicidal Intent: No Current Homicidal Plan: No Access to Homicidal Means: No Identified Victim: n/a History of harm to others?: No Assessment of Violence: None Noted Violent Behavior Description: n/a Does patient have access to weapons?: No Criminal Charges Pending?: No Does patient have a court date: No Prior Inpatient Therapy: Prior Inpatient Therapy: No Prior Therapy Dates: n/a Prior Therapy Facilty/Provider(s): n/a Reason for Treatment: n/a Prior Outpatient Therapy: Prior Outpatient Therapy: No Prior Therapy Dates: n/a Prior Therapy Facilty/Provider(s): n/a Reason for Treatment: n/a Does patient have an ACCT team?: No Does patient have Intensive In-House Services?  : No Does patient have [facility] services? : No Does patient have P4CC services?: No  Past Medical History:  Past Medical History  Diagnosis Date  . Primary cancer of right lower lobe of lung ([place]) 07/12/2015    Small cell undifferentiated carcinoma of lung.  Diagnosis at [facility] by fine-needle aspiration of lymph node (January, 2017)  . Bipolar 2 disorder ([place])   . Borderline schizophrenia   . Falls infrequently     Past Surgical History  Procedure Laterality Date  . Abdominal hysterectomy    . Appendectomy    . Stomach surgery    . Nasal sinus surgery      x2  . Peripheral vascular catheterization N/A 07/30/2015    Procedure: Glori Luis Cath Insertion;  Surgeon: Algernon Huxley, MD;  Location: [place] [facility];  Service: Cardiovascular;  Laterality: N/A;   Family History: History reviewed. No pertinent family history. Social History:  History  Alcohol Use No     History  Drug Use  . Yes  .  Special: Cocaine    Comment: States due to cocaine in mouthwash    Social History   Social History  . Marital Status: Divorced    Spouse Name: N/A  . Number of Children: N/A  . Years of Education: N/A   Social History Main Topics  . Smoking status: Former Smoker -- 0.50 packs/day for 30 years    Quit date: 06/17/2015  . Smokeless tobacco: None  . Alcohol Use: No  . Drug Use: Yes    Special: Cocaine     Comment: States due to cocaine in mouthwash  . Sexual Activity: Not Asked   Other Topics Concern  . None   Social History Narrative   Additional Social History:    Allergies:   Allergies  Allergen Reactions  . Amoxicillin-Pot Clavulanate Hives    Also vomiting  . Septra [Sulfamethoxazole-Trimethoprim] Hives and Nausea And Vomiting    Labs:  Results for orders placed or performed during the hospital encounter of 12/20/15 (from the past 48 hour(s))  Urinalysis complete, with microscopic     Status: Abnormal   Collection Time: 12/20/15  7:18 PM  Result Value Ref Range   Color, Urine YELLOW (A) YELLOW   APPearance CLEAR (A) CLEAR   Glucose, UA NEGATIVE NEGATIVE mg/dL   Bilirubin Urine NEGATIVE NEGATIVE   Ketones, ur NEGATIVE NEGATIVE mg/dL   Specific Gravity, Urine 1.014 1.005 - 1.030   Hgb  urine dipstick 2+ (A) NEGATIVE   pH 6.0 5.0 - 8.0   Protein, ur 30 (A) NEGATIVE mg/dL   Nitrite NEGATIVE NEGATIVE   Leukocytes, UA NEGATIVE NEGATIVE   RBC / HPF 0-5 0 - 5 RBC/hpf   WBC, UA 0-5 0 - 5 WBC/hpf   Bacteria, UA NONE SEEN NONE SEEN   Squamous Epithelial / LPF NONE SEEN NONE SEEN   Mucous PRESENT    Hyaline Casts, UA PRESENT   Urine Drug Screen, Qualitative ([facility] only)     Status: Abnormal   Collection Time: 12/20/15  7:18 PM  Result Value Ref Range   Tricyclic, Ur Screen POSITIVE (A) NONE DETECTED   Amphetamines, Ur Screen NONE DETECTED NONE DETECTED   MDMA (Ecstasy)Ur Screen NONE DETECTED NONE DETECTED   Cocaine Metabolite,Ur [place] POSITIVE (A) NONE DETECTED    Opiate, Ur Screen NONE DETECTED NONE DETECTED   Phencyclidine (PCP) Ur S NONE DETECTED NONE DETECTED   Cannabinoid 50 Ng, Ur [place] NONE DETECTED NONE DETECTED   Barbiturates, Ur Screen NONE DETECTED NONE DETECTED   Benzodiazepine, Ur Scrn POSITIVE (A) NONE DETECTED   Methadone Scn, Ur NONE DETECTED NONE DETECTED    Comment: (NOTE) 397  Tricyclics, urine               Cutoff 1000 ng/mL 200  Amphetamines, urine             Cutoff 1000 ng/mL 300  MDMA (Ecstasy), urine           Cutoff 500 ng/mL 400  Cocaine Metabolite, urine       Cutoff 300 ng/mL 500  Opiate, urine                   Cutoff 300 ng/mL 600  Phencyclidine (PCP), urine      Cutoff 25 ng/mL 700  Cannabinoid, urine              Cutoff 50 ng/mL 800  Barbiturates, urine             Cutoff 200 ng/mL 900  Benzodiazepine, urine           Cutoff 200 ng/mL 1000 Methadone, urine                Cutoff 300 ng/mL 1100 1200 The urine drug screen provides only a preliminary, unconfirmed 1300 analytical test result and should not be used for non-medical 1400 purposes. Clinical consideration and professional judgment should 1500 be applied to any positive drug screen result due to possible 1600 interfering substances. A more specific alternate chemical method 1700 must be used in order to obtain a confirmed analytical result.  1800 Gas chromato graphy / mass spectrometry (GC/MS) is the preferred 1900 confirmatory method.   Comprehensive metabolic panel     Status: Abnormal   Collection Time: 12/20/15  9:35 PM  Result Value Ref Range   Sodium 135 135 - 145 mmol/L   Potassium 4.1 3.5 - 5.1 mmol/L   Chloride 98 (L) 101 - 111 mmol/L   CO2 29 22 - 32 mmol/L   Glucose, Bld 144 (H) 65 - 99 mg/dL   BUN 19 6 - 20 mg/dL   Creatinine, Ser 0.77 0.44 - 1.00 mg/dL   Calcium 9.0 8.9 - 10.3 mg/dL   Total Protein 6.6 6.5 - 8.1 g/dL   Albumin 4.1 3.5 - 5.0 g/dL   AST 108 (H) 15 - 41 U/L   ALT 59 (H) 14 - 54 U/L   Alkaline  Phosphatase 53 38 - 126 U/L    Total Bilirubin 0.6 0.3 - 1.2 mg/dL   GFR calc non Af Amer >60 >60 mL/min   GFR calc Af Amer >60 >60 mL/min    Comment: (NOTE) The eGFR has been calculated using the CKD EPI equation. This calculation has not been validated in all clinical situations. eGFR's persistently <60 mL/min signify possible Chronic Kidney Disease.    Anion gap 8 5 - 15  CBC with Differential     Status: Abnormal   Collection Time: 12/20/15  9:35 PM  Result Value Ref Range   WBC 9.2 3.6 - 11.0 K/uL   RBC 3.51 (L) 3.80 - 5.20 MIL/uL   Hemoglobin 12.0 12.0 - 16.0 g/dL   HCT 34.2 (L) 35.0 - 47.0 %   MCV 97.5 80.0 - 100.0 fL   MCH 34.2 (H) 26.0 - 34.0 pg   MCHC 35.1 32.0 - 36.0 g/dL   RDW 14.1 11.5 - 14.5 %   Platelets 183 150 - 440 K/uL   Neutrophils Relative % 91 %   Neutro Abs 8.4 (H) 1.4 - 6.5 K/uL   Lymphocytes Relative 2 %   Lymphs Abs 0.2 (L) 1.0 - 3.6 K/uL   Monocytes Relative 7 %   Monocytes Absolute 0.6 0.2 - 0.9 K/uL   Eosinophils Relative 0 %   Eosinophils Absolute 0.0 0 - 0.7 K/uL   Basophils Relative 0 %   Basophils Absolute 0.0 0 - 0.1 K/uL    Current Facility-Administered Medications  Medication Dose Route Frequency Provider Last Rate Last Dose  . albuterol (PROVENTIL HFA;VENTOLIN HFA) 108 (90 Base) MCG/ACT inhaler 2 puff  2 puff Inhalation Q4H PRN Buford Gayler B Leman Martinek, MD      . cyclobenzaprine (FLEXERIL) tablet 10 mg  10 mg Oral Daily Hinda Kehr, MD   10 mg at 12/21/15 0817  . guaiFENesin-dextromethorphan (ROBITUSSIN DM) 100-10 MG/5ML syrup 5 mL  5 mL Oral Q4H PRN Hinda Kehr, MD   5 mL at 12/21/15 0619  . lidocaine (LIDODERM) 5 % 1 patch  1 patch Transdermal Q24H Nena Polio, MD   1 patch at 12/21/15 0049  . oxyCODONE-acetaminophen (PERCOCET) 7.5-325 MG per tablet 1 tablet  1 tablet Oral Q6H PRN Hinda Kehr, MD   1 tablet at 12/21/15 8453   Current Outpatient Prescriptions  Medication Sig Dispense Refill  . albuterol (PROVENTIL HFA) 108 (90 Base) MCG/ACT inhaler     .  albuterol (PROVENTIL HFA;VENTOLIN HFA) 108 (90 Base) MCG/ACT inhaler Inhale into the lungs.    . benzonatate (TESSALON PERLES) 100 MG capsule Take 100 mg by mouth.    . cyclobenzaprine (FLEXERIL) 10 MG tablet Take 10 mg by mouth.    . dexamethasone (DECADRON) 4 MG tablet Take 1 tablet (4 mg total) by mouth daily. 1 tab daily until radiation is finished then 1 tab every other day until finished 30 tablet 0  . dexamethasone (DECADRON) 4 MG tablet Take 1 tablet (4 mg total) by mouth 2 (two) times daily with a meal. Please use [employer] 35 tablet 0  . diclofenac (VOLTAREN) 75 MG EC tablet Take 75 mg by mouth.    . Diphenhyd-Hydrocort-Nystatin (FIRST-DUKES MOUTHWASH) SUSP Take 41m by mouth, swish and swallow three times a day 237 mL 3  . doxycycline (VIBRAMYCIN) 100 MG capsule Take 1 capsule (100 mg total) by mouth 2 (two) times daily. 14 capsule 0  . DULoxetine (CYMBALTA) 60 MG capsule Take 60 mg by mouth. Reported on 11/01/2015    .  fluconazole (DIFLUCAN) 100 MG tablet Take 1 tablet (100 mg total) by mouth daily. 5 tablet 9  . fluticasone (FLONASE) 50 MCG/ACT nasal spray 1 spray by Each Nare route daily.    Marland Kitchen guaiFENesin-codeine 100-10 MG/5ML syrup Take 5 mLs by mouth 3 (three) times daily as needed for cough. 240 mL 0  . lidocaine (ASPERCREME W/LIDOCAINE) 4 % cream Apply 2 g topically Three (3) times a day as needed.    . lidocaine (XYLOCAINE) 5 % ointment Apply topically.    . lidocaine-prilocaine (EMLA) cream Apply 1 application topically as needed. 30 g 3  . loratadine (CLARITIN) 10 MG tablet Take 10 mg by mouth.    Marland Kitchen LORazepam (ATIVAN) 1 MG tablet Take 1 tablet by mouth once 1 hour prior to scan 1 tablet 0  . nystatin (MYCOSTATIN) 100000 UNIT/ML suspension Take by mouth. Reported on 11/01/2015    . ondansetron (ZOFRAN) 4 MG tablet Take 4 mg by mouth.    . oxyCODONE-acetaminophen (PERCOCET) 7.5-325 MG tablet Take 1 tablet by mouth every 6 (six) hours as needed for severe pain. 40 tablet 0  .  prochlorperazine (COMPAZINE) 10 MG tablet Take 1 tablet (10 mg total) by mouth every 6 (six) hours as needed for nausea or vomiting. 15 tablet 0  . Saline (AYR SALINE NASAL DROPS) 0.65 % (Soln) SOLN 2 drops by Each Nare route every four (4) hours as needed.    . sucralfate (CARAFATE) 1 g tablet Take 1 tablet (1 g total) by mouth 4 (four) times daily. Dissolve in 2-3 tbsp warm water, swish and swallow. 120 tablet 3    Musculoskeletal: Strength & Muscle Tone: within normal limits Gait & Station: normal Patient leans: N/A  Psychiatric Specialty Exam: Physical Exam  Nursing note and vitals reviewed.   Review of Systems  Musculoskeletal: Positive for back pain.  Psychiatric/Behavioral: Positive for hallucinations and substance abuse. The patient is nervous/anxious.   All other systems reviewed and are negative.   Blood pressure 136/94, pulse 97, temperature 98 F (36.7 C), temperature source Oral, resp. rate 20, height 5' 8" (1.727 m), weight 67.586 kg (149 lb), SpO2 95 %.Body mass index is 22.66 kg/(m^2).  General Appearance: Casual  Eye Contact:  Good  Speech:  Pressured  Volume:  Increased  Mood:  Euphoric  Affect:  Appropriate  Thought Process:  Disorganized  Orientation:  Full (Time, Place, and Person)  Thought Content:  Delusions and Paranoid Ideation  Suicidal Thoughts:  No  Homicidal Thoughts:  No  Memory:  Immediate;   Fair Recent;   Fair Remote;   Fair  Judgement:  Fair  Insight:  Fair  Psychomotor Activity:  Increased  Concentration:  Concentration: Fair and Attention Span: Fair  Recall:  [employer] of Knowledge:  Fair  Language:  Fair  Akathisia:  No  Handed:  Right  AIMS (if indicated):     Assets:  Communication Skills Desire for Improvement Housing Resilience Social Support  ADL's:  Intact  Cognition:  WNL  Sleep:        Treatment Plan Summary: Daily contact with patient to assess and evaluate symptoms and progress in treatment and Medication  management  Disposition: Recommend psychiatric Inpatient admission when medically cleared. Supportive therapy provided about ongoing stressors. Discussed crisis plan, support from social network, calling 911, coming to the Emergency Department, and calling Suicide Hotline.   Ms. Rozell is a 55 year old female with a history of bipolar disorder and stage IV cancer admitted in the manic psychotic  episode while off medications.   1. Mood and psychosis. We will start Depakote and Risperdal for mood stabilization.  2. Anxiety of OCD or PTSD type. I will start Luvox for OCD. We will consider Minipress for my first and flashbacks. She has been asking for Ativan. I will not prescribe benzodiazepines as she is on narcotic painkillers.  3. COPD. We will continue inhalers.  4. Smoking. Nicotine patch is available.  5. Insomnia. We will offer trazodone.  6. Chronic pain. She has been taking Voltaren and narcotic painkillers.   7. Thrush. She is on Diflucan and nystatin.  8. GERD. She is on Carafate.  9. Consequent treatment. We will ask oncology for consultation. The patient reports that she just completed dexamethasone taper.  10. Metabolic syndromes screening. Lipid panel, TSH, hemoglobin A1c and prolactin are pending.     Orson Slick, MD 12/21/2015 1:29 PM Result I I) we will have nightmares because the sister was his name Mr. Tomasita Crumble he is to his L) is an she is been psychotic for quite a while he is Roe [place] and OCD is okay no May E will, Mr. Mr. Hiram Comber he going on Sunday [place] his mother and thinking I am not otherwis

## 2015-12-21 NOTE — ED Notes (Signed)
Psychiatrist in speaking with pt at this time.

## 2015-12-21 NOTE — ED Notes (Signed)
ED BHU [place] Is the patient under IVC or is there intent for IVC: Yes.   Is the patient medically cleared: Yes.   Is there vacancy in the ED BHU: Yes.   - pt is a moderate fall risk  Is the population mix appropriate for patient: Yes.   Is the patient awaiting placement in inpatient or outpatient setting:   Has the patient had a psychiatric consult:  Consult pending  Survey of unit performed for contraband, proper placement and condition of furniture, tampering with fixtures in bathroom, shower, and each patient room: Yes.  ; Findings:  APPEARANCE/BEHAVIOR  Anxious  - moaning - yelling  Attention seeking  NEURO ASSESSMENT Orientation: oriented x4  Denies pain Hallucinations: No.None noted (Hallucinations) Speech: Normal Gait: normal RESPIRATORY ASSESSMENT Even  Unlabored respirations  CARDIOVASCULAR ASSESSMENT Pulses equal   regular rate  Skin warm and dry   GASTROINTESTINAL ASSESSMENT no GI complaint EXTREMITIES Full ROM  PLAN OF CARE Provide calm/safe environment. Vital signs assessed twice daily. ED BHU Assessment once each 12-hour shift. Collaborate with intake RN daily or as condition indicates. Assure the ED provider has rounded once each shift. Provide and encourage hygiene. Provide redirection as needed. Assess for escalating behavior; address immediately and inform ED provider.  Assess family dynamic and appropriateness for visitation as needed: Yes.  ; If necessary, describe findings:  Educate the patient/family about BHU procedures/visitation: Yes.  ; If necessary, describe findings:

## 2015-12-21 NOTE — ED Notes (Signed)
Pt ambulatory to bathroom at this time.

## 2015-12-21 NOTE — ED Provider Notes (Signed)
-----------------------------------------   6:57 AM on 12/21/2015 -----------------------------------------   Blood pressure 136/94, pulse 97, temperature 98 F (36.7 C), temperature source Oral, resp. rate 20, height '5\' 8"'$  (1.727 m), weight 67.586 kg, SpO2 95 %.  The patient had no acute events since last update.  Calm and cooperative at this time.  Disposition is pending per Psychiatry/Behavioral Medicine team recommendations. Ordered a few of the medications listed on list including Flexeril and opioid.     Hinda Kehr, MD 12/21/15 [id number]

## 2015-12-22 DIAGNOSIS — F312 Bipolar disorder, current episode manic severe with psychotic features: Principal | ICD-10-CM

## 2015-12-22 LAB — LIPID PANEL
CHOL/HDL RATIO: 3.4 ratio
CHOLESTEROL: 179 mg/dL (ref 0–200)
HDL: 52 mg/dL (ref >40)
LDL Cholesterol: 78 mg/dL (ref 0–99)
TRIGLYCERIDES: 246 mg/dL — AB (ref <150)
VLDL: 49 mg/dL — AB (ref 0–40)

## 2015-12-22 LAB — HEMOGLOBIN A1C: Hgb A1c MFr Bld: 6.2 % — ABNORMAL HIGH (ref 4.0–6.0)

## 2015-12-22 LAB — TSH: TSH: 2.771 u[IU]/mL (ref 0.350–4.500)

## 2015-12-22 MED ORDER — TRAZODONE HCL 100 MG PO TABS
100.0000 mg | ORAL_TABLET | Freq: Every day | ORAL | Status: DC
  Administered 2015-12-22 – 2015-12-23 (×2): 100 mg via ORAL
  Filled 2015-12-22 (×2): qty 1

## 2015-12-22 MED ORDER — FLUVOXAMINE MALEATE 50 MG PO TABS
100.0000 mg | ORAL_TABLET | Freq: Every day | ORAL | Status: DC
  Administered 2015-12-22 – 2015-12-23 (×2): 100 mg via ORAL
  Filled 2015-12-22 (×2): qty 2

## 2015-12-22 MED ORDER — CYCLOBENZAPRINE HCL 10 MG PO TABS
10.0000 mg | ORAL_TABLET | Freq: Three times a day (TID) | ORAL | Status: DC
  Administered 2015-12-22 – 2015-12-30 (×22): 10 mg via ORAL
  Filled 2015-12-22 (×23): qty 1

## 2015-12-22 NOTE — Progress Notes (Signed)
Patient is alert and oriented, she is talkative, does complain of back spasms and pain, nurse did treat with percocet and she did say the pain had subsided some, patient is pleasant, denies Si/Hi or avh, she is teary eyed at times, states she has went thru a lot since Jan 2nd of this year with the diagnosis of cancer and all the steriods she had to take caused her to get off chemically and she got in a manic states, she has improved, Patient talks about God and how He has had favor on her in her storm , and that her brother is a Environmental education officer that has helped her thru this illness. Patient ambulates with walker, encouraged her to be careful and take her time.  Patient will be monitored, q 15 min checks per CNAs in progress.

## 2015-12-22 NOTE — [facility] Suicide Risk Assessment (Signed)
[facility] Admission Suicide Risk Assessment   Nursing information obtained from:    Demographic factors:    Current Mental Status:    Loss Factors:    Historical Factors:    Risk Reduction Factors:     Total Time spent with patient: 30 minutes Principal Problem: <principal problem not specified> Diagnosis:   Patient Active Problem List   Diagnosis Date Noted  . Acute psychosis associated with endocrine, metabolic, or cerebrovascular disorder [F06.8, F01.50] 12/21/2015  . Cocaine use disorder, moderate, dependence ([place]) [F14.20] 12/21/2015  . Bipolar I disorder, most recent episode manic, severe with psychotic features ([place]) [F31.2] 12/21/2015  . OCD (obsessive compulsive disorder) [F42.9] 12/21/2015  . PTSD (post-traumatic stress disorder) [F43.10] 12/21/2015  . Abnormal cells of cervix [N87.9] 07/30/2015  . Encounter for general adult medical examination without abnormal findings [Z00.00] 07/28/2015  . Primary cancer of right lower lobe of lung ([place]) [C34.31] 07/12/2015  . At risk for noncompliance [Z87.898] 06/29/2015  . Spasm [R25.2] 06/26/2015  . Essential (primary) hypertension [I10] 06/21/2015  . Lung mass [R91.8] 06/21/2015  . Candida infection of mouth [B37.0] 06/21/2015  . Small cell carcinoma of lung ([place]) [C34.90] 06/21/2015  . Episodic mood disorder ([place]) [F39] 10/14/2014  . Chronic obstructive pulmonary disease ([place]) [J44.9] 10/06/2014  . Pulmonary emphysema ([place]) [J43.9] 10/06/2014  . LBP (low back pain) [M54.5] 09/27/2014  . Tobacco use disorder [F17.200] 09/27/2014   Subjective Data: psychotic break.  Continued Clinical Symptoms:  Alcohol Use Disorder Identification Test Final Score (AUDIT): 0 The "Alcohol Use Disorders Identification Test", Guidelines for Use in Primary Care, Second Edition.  World Pharmacologist [facility]). Score between 0-7:  no or low risk or alcohol related problems. Score between 8-15:  moderate risk of alcohol related problems. Score between  16-19:  high risk of alcohol related problems. Score 20 or above:  warrants further diagnostic evaluation for alcohol dependence and treatment.   CLINICAL FACTORS:   Bipolar Disorder:   Mixed State   Musculoskeletal: Strength & Muscle Tone: within normal limits Gait & Station: normal Patient leans: N/A  Psychiatric Specialty Exam: Physical Exam  Nursing note and vitals reviewed.   Review of Systems  Musculoskeletal: Positive for back pain.  Psychiatric/Behavioral: Positive for hallucinations and substance abuse.  All other systems reviewed and are negative.   Blood pressure 132/87, pulse 92, temperature 98 F (36.7 C), temperature source Oral, resp. rate 16, SpO2 97 %.There is no weight on file to calculate BMI.  General Appearance: Casual  Eye Contact:  Good  Speech:  Clear and Coherent  Volume:  Normal  Mood:  Euphoric  Affect:  Appropriate  Thought Process:  Disorganized  Orientation:  Full (Time, Place, and Person)  Thought Content:  Delusions and Paranoid Ideation  Suicidal Thoughts:  No  Homicidal Thoughts:  No  Memory:  Immediate;   Fair Recent;   Fair Remote;   Fair  Judgement:  Impaired  Insight:  Shallow  Psychomotor Activity:  Increased  Concentration:  Concentration: Poor and Attention Span: Poor  Recall:  Poor  Fund of Knowledge:  Fair  Language:  Fair  Akathisia:  No  Handed:  Right  AIMS (if indicated):     Assets:  Communication Skills Desire for Improvement Housing Resilience Social Support  ADL's:  Intact  Cognition:  WNL  Sleep:         COGNITIVE FEATURES THAT CONTRIBUTE TO RISK:  None    SUICIDE RISK:   Mild:  Suicidal ideation of limited frequency, intensity,  duration, and specificity.  There are no identifiable plans, no associated intent, mild dysphoria and related symptoms, good self-control (both objective and subjective assessment), few other risk factors, and identifiable protective factors, including available and accessible  social support.  PLAN OF CARE: hospital admission, medication management, substance abuse counselling, discharge planning.  Ms. Digiulio is a 55 year old female with a history of bipolar disorder and stage IV cancer admitted in the manic psychotic episode while off medications.   1. Mood and psychosis. We will start Depakote and Risperdal for mood stabilization.  2. Anxiety of OCD or PTSD type. I will start Luvox for OCD. We will consider Minipress for my first and flashbacks. She has been asking for Ativan. I will not prescribe benzodiazepines as she is on narcotic painkillers.  3. COPD. We will continue inhalers.  4. Smoking. Nicotine patch is available.  5. Insomnia. We will offer trazodone.  6. Chronic pain. She has been taking Voltaren and narcotic painkillers.   7. Thrush. She is on Diflucan and nystatin.  8. GERD. She is on Carafate.  9. Consequent treatment. We will ask oncology for consultation. The patient reports that she just completed dexamethasone taper.  10. Metabolic syndromes screening. Lipid panel, TSH, hemoglobin A1c and prolactin are pending.    I certify that inpatient services furnished can reasonably be expected to improve the patient's condition.   Orson Slick, MD 12/22/2015, 5:10 AM

## 2015-12-22 NOTE — B[facility] Group Notes (Signed)
[place] LCSW Group Therapy  12/22/2015 2:09 PM  Type of Therapy:  Group Therapy  Participation Level:  Did Not Attend  Modes of Intervention:  Discussion, Education, Socialization and Support  Summary of Progress/Problems: Balance in life: Patients will discuss the concept of balance and how it looks and feels to be unbalanced. Pt will identify areas in their life that is unbalanced and ways to become more balanced.    [place]  MSW, [place]  12/22/2015, 2:09 PM

## 2015-12-22 NOTE — H&P (Signed)
Psychiatric Admission Assessment Adult  Patient Identification: Charlene [name] MRN:  [id number] Date of Evaluation:  12/22/2015 Chief Complaint:  bipolar Principal Diagnosis: <principal problem not specified> Diagnosis:   Patient Active Problem List   Diagnosis Date Noted  . Acute psychosis associated with endocrine, metabolic, or cerebrovascular disorder [F06.8, F01.50] 12/21/2015  . Cocaine use disorder, moderate, dependence ([place]) [F14.20] 12/21/2015  . Bipolar I disorder, most recent episode manic, severe with psychotic features ([place]) [F31.2] 12/21/2015  . OCD (obsessive compulsive disorder) [F42.9] 12/21/2015  . PTSD (post-traumatic stress disorder) [F43.10] 12/21/2015  . Abnormal cells of cervix [N87.9] 07/30/2015  . Encounter for general adult medical examination without abnormal findings [Z00.00] 07/28/2015  . Primary cancer of right lower lobe of lung ([place]) [C34.31] 07/12/2015  . At risk for noncompliance [Z87.898] 06/29/2015  . Spasm [R25.2] 06/26/2015  . Essential (primary) hypertension [I10] 06/21/2015  . Lung mass [R91.8] 06/21/2015  . Candida infection of mouth [B37.0] 06/21/2015  . Small cell carcinoma of lung ([place]) [C34.90] 06/21/2015  . Episodic mood disorder ([place]) [F39] 10/14/2014  . Chronic obstructive pulmonary disease ([place]) [J44.9] 10/06/2014  . Pulmonary emphysema ([place]) [J43.9] 10/06/2014  . LBP (low back pain) [M54.5] 09/27/2014  . Tobacco use disorder [F17.200] 09/27/2014   History of Present Illness:   Identifying data. Charlene [name] is a 55 y.o. female with a history of bipolar disorder and disseminated smal cell lung carcinoma.   Chief complaint. "I need to get back on my medications."  History of present illness. Information was obtained from the patient and the chart. The patient has a long history of bipolar disorder but has not been treated for it recently as she has been receiving cancer treatment. In reviewing the notes of Dr. Oliva Bustard, her  oncologist, it appears that the patient has been having behavioral problems and mood swings all along. She has been encouraged on multiple occasions psychiatric help. She was brought to the emergency room for agitated, combative behavior and disorganized, psychotic thinking. The patient claims that she herself was seeking help. The patient reports recent increase in energy. She's been cleaning and organizing all night long unable to sleep. She became grandiose and delusional. She believes that this person in energy is related to the fact that she has been cured of cancer. We understand from the chart and the patient that she just completed radiation therapy of the brain and dexamethasone treatment which could have been contributing factors. The patient denies symptoms of depression. She denies symptoms of psychosis and does not think that she is in a manic episode. She reports symptoms of PTSD and OCD. She adamantly denies alcohol or illicit substance use but her drug screen was positive for cocaine. She admitted to one of the nurses to using cocaine. 2 other people she claimed that cocaine came from [facility] mouthwash.  Past psychiatric history. The patient has a long history of bipolar illness. She was hospitalized 8 or 10 times in the remote past. She attempted suicide twice by overdose "a long time ago." She has been treated with a combination of Depakote, lithium, Cymbalta, and Risperdal.  Family psychiatric history. Daughter with bipolar.  Social history. 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 She applied for disability and hopes to receive her first check very shortly. She believes that she will find an apartment get driver's license and a car and be independent. At the moment she lives with her parents.  Total Time spent with patient: 30 minutes  Is  the patient at risk to self? No.  Has the patient been a risk to self in the past 6 months? No.  Has the patient been a risk to self within the  distant past? No.  Is the patient a risk to others? No.  Has the patient been a risk to others in the past 6 months? No.  Has the patient been a risk to others within the distant past? No.   Prior Inpatient Therapy:   Prior Outpatient Therapy:    Alcohol Screening: 1. How often do you have a drink containing alcohol?: Never 9. Have you or someone else been injured as a result of your drinking?: No 10. Has a relative or friend or a doctor or another health worker been concerned about your drinking or suggested you cut down?: No Alcohol Use Disorder Identification Test Final Score (AUDIT): 0 Brief Intervention: AUDIT score less than 7 or less-screening does not suggest unhealthy drinking-brief intervention not indicated Substance Abuse History in the last 12 months:  Yes.   Consequences of Substance Abuse: Negative Previous Psychotropic Medications: No  Psychological Evaluations: No  Past Medical History:  Past Medical History  Diagnosis Date  . Primary cancer of right lower lobe of lung ([place]) 07/12/2015    Small cell undifferentiated carcinoma of lung.  Diagnosis at [facility] by fine-needle aspiration of lymph node (January, 2017)  . Bipolar 2 disorder ([place])   . Borderline schizophrenia   . Falls infrequently     Past Surgical History  Procedure Laterality Date  . Abdominal hysterectomy    . Appendectomy    . Stomach surgery    . Nasal sinus surgery      x2  . Peripheral vascular catheterization N/A 07/30/2015    Procedure: Glori Luis Cath Insertion;  Surgeon: Algernon Huxley, MD;  Location: [facility];  Service: Cardiovascular;  Laterality: N/A;   Family History: History reviewed. No pertinent family history.  Tobacco Screening: _0 [phone])::1)@ Social History:  History  Alcohol Use No     History  Drug Use  . Yes  . Special: Cocaine    Comment: States due to cocaine in mouthwash    Additional Social History:                            Allergies:   Allergies  Allergen Reactions  . Amoxicillin-Pot Clavulanate Hives    Also vomiting  . Septra [Sulfamethoxazole-Trimethoprim] Hives and Nausea And Vomiting   Lab Results:  Results for orders placed or performed during the hospital encounter of 12/20/15 (from the past 48 hour(s))  Urinalysis complete, with microscopic     Status: Abnormal   Collection Time: 12/20/15  7:18 PM  Result Value Ref Range   Color, Urine YELLOW (A) YELLOW   APPearance CLEAR (A) CLEAR   Glucose, UA NEGATIVE NEGATIVE mg/dL   Bilirubin Urine NEGATIVE NEGATIVE   Ketones, ur NEGATIVE NEGATIVE mg/dL   Specific Gravity, Urine 1.014 1.005 - 1.030   Hgb urine dipstick 2+ (A) NEGATIVE   pH 6.0 5.0 - 8.0   Protein, ur 30 (A) NEGATIVE mg/dL   Nitrite NEGATIVE NEGATIVE   Leukocytes, UA NEGATIVE NEGATIVE   RBC / HPF 0-5 0 - 5 RBC/hpf   WBC, UA 0-5 0 - 5 WBC/hpf   Bacteria, UA NONE SEEN NONE SEEN   Squamous Epithelial / LPF NONE SEEN NONE SEEN   Mucous PRESENT    Hyaline Casts, UA PRESENT  Urine Drug Screen, Qualitative ([facility] only)     Status: Abnormal   Collection Time: 12/20/15  7:18 PM  Result Value Ref Range   Tricyclic, Ur Screen POSITIVE (A) NONE DETECTED   Amphetamines, Ur Screen NONE DETECTED NONE DETECTED   MDMA (Ecstasy)Ur Screen NONE DETECTED NONE DETECTED   Cocaine Metabolite,Ur [place] POSITIVE (A) NONE DETECTED   Opiate, Ur Screen NONE DETECTED NONE DETECTED   Phencyclidine (PCP) Ur S NONE DETECTED NONE DETECTED   Cannabinoid 50 Ng, Ur [place] NONE DETECTED NONE DETECTED   Barbiturates, Ur Screen NONE DETECTED NONE DETECTED   Benzodiazepine, Ur Scrn POSITIVE (A) NONE DETECTED   Methadone Scn, Ur NONE DETECTED NONE DETECTED    Comment: (NOTE) 222  Tricyclics, urine               Cutoff 1000 ng/mL 200  Amphetamines, urine             Cutoff 1000 ng/mL 300  MDMA (Ecstasy), urine           Cutoff 500 ng/mL 400  Cocaine Metabolite, urine       Cutoff 300 ng/mL 500  Opiate, urine                    Cutoff 300 ng/mL 600  Phencyclidine (PCP), urine      Cutoff 25 ng/mL 700  Cannabinoid, urine              Cutoff 50 ng/mL 800  Barbiturates, urine             Cutoff 200 ng/mL 900  Benzodiazepine, urine           Cutoff 200 ng/mL 1000 Methadone, urine                Cutoff 300 ng/mL 1100 1200 The urine drug screen provides only a preliminary, unconfirmed 1300 analytical test result and should not be used for non-medical 1400 purposes. Clinical consideration and professional judgment should 1500 be applied to any positive drug screen result due to possible 1600 interfering substances. A more specific alternate chemical method 1700 must be used in order to obtain a confirmed analytical result.  1800 Gas chromato graphy / mass spectrometry (GC/MS) is the preferred 1900 confirmatory method.   Comprehensive metabolic panel     Status: Abnormal   Collection Time: 12/20/15  9:35 PM  Result Value Ref Range   Sodium 135 135 - 145 mmol/L   Potassium 4.1 3.5 - 5.1 mmol/L   Chloride 98 (L) 101 - 111 mmol/L   CO2 29 22 - 32 mmol/L   Glucose, Bld 144 (H) 65 - 99 mg/dL   BUN 19 6 - 20 mg/dL   Creatinine, Ser 0.77 0.44 - 1.00 mg/dL   Calcium 9.0 8.9 - 10.3 mg/dL   Total Protein 6.6 6.5 - 8.1 g/dL   Albumin 4.1 3.5 - 5.0 g/dL   AST 108 (H) 15 - 41 U/L   ALT 59 (H) 14 - 54 U/L   Alkaline Phosphatase 53 38 - 126 U/L   Total Bilirubin 0.6 0.3 - 1.2 mg/dL   GFR calc non Af Amer >60 >60 mL/min   GFR calc Af Amer >60 >60 mL/min    Comment: (NOTE) The eGFR has been calculated using the CKD EPI equation. This calculation has not been validated in all clinical situations. eGFR's persistently <60 mL/min signify possible Chronic Kidney Disease.    Anion gap 8 5 - 15  CBC with Differential  Status: Abnormal   Collection Time: 12/20/15  9:35 PM  Result Value Ref Range   WBC 9.2 3.6 - 11.0 K/uL   RBC 3.51 (L) 3.80 - 5.20 MIL/uL   Hemoglobin 12.0 12.0 - 16.0 g/dL   HCT 34.2 (L) 35.0 - 47.0 %    MCV 97.5 80.0 - 100.0 fL   MCH 34.2 (H) 26.0 - 34.0 pg   MCHC 35.1 32.0 - 36.0 g/dL   RDW 14.1 11.5 - 14.5 %   Platelets 183 150 - 440 K/uL   Neutrophils Relative % 91 %   Neutro Abs 8.4 (H) 1.4 - 6.5 K/uL   Lymphocytes Relative 2 %   Lymphs Abs 0.2 (L) 1.0 - 3.6 K/uL   Monocytes Relative 7 %   Monocytes Absolute 0.6 0.2 - 0.9 K/uL   Eosinophils Relative 0 %   Eosinophils Absolute 0.0 0 - 0.7 K/uL   Basophils Relative 0 %   Basophils Absolute 0.0 0 - 0.1 K/uL    Blood Alcohol level:  No results found for: [facility]  Metabolic Disorder Labs:  No results found for: HGBA1C, MPG No results found for: PROLACTIN No results found for: CHOL, TRIG, HDL, CHOLHDL, VLDL, LDLCALC  Current Medications: Current Facility-Administered Medications  Medication Dose Route Frequency Provider Last Rate Last Dose  . albuterol (PROVENTIL HFA;VENTOLIN HFA) 108 (90 Base) MCG/ACT inhaler 2 puff  2 puff Inhalation Q4H PRN Clovis Fredrickson, MD   2 puff at 12/21/15 2049  . cyclobenzaprine (FLEXERIL) tablet 10 mg  10 mg Oral Daily Kniyah Khun B Curry Dulski, MD      . diclofenac (VOLTAREN) EC tablet 75 mg  75 mg Oral BID Deisha Stull B Tenicia Gural, MD   75 mg at 12/21/15 2340  . divalproex (DEPAKOTE) DR tablet 500 mg  500 mg Oral Q8H Latif Nazareno B Lotus Gover, MD   500 mg at 12/21/15 2052  . fluconazole (DIFLUCAN) tablet 100 mg  100 mg Oral Daily Keela Rubert B Lavonta Tillis, MD      . fluticasone (FLONASE) 50 MCG/ACT nasal spray 2 spray  2 spray Each Nare Daily Deneane Stifter B Margie Brink, MD      . fluvoxaMINE (LUVOX) tablet 50 mg  50 mg Oral QHS Clovis Fredrickson, MD   50 mg at 12/21/15 2052  . guaiFENesin-dextromethorphan (ROBITUSSIN DM) 100-10 MG/5ML syrup 5 mL  5 mL Oral Q4H PRN Clovis Fredrickson, MD   5 mL at 12/21/15 2341  . loratadine (CLARITIN) tablet 10 mg  10 mg Oral Daily Sierria Bruney B Shammara Jarrett, MD      . magic mouthwash w/lidocaine  10 mL Oral QID PRN Jalani Rominger B Maclaine Ahola, MD      . nystatin (MYCOSTATIN) 100000 UNIT/ML  suspension 500,000 Units  5 mL Oral QID Clovis Fredrickson, MD   500,000 Units at 12/21/15 2341  . oxyCODONE-acetaminophen (PERCOCET) 7.5-325 MG per tablet 1 tablet  1 tablet Oral Q6H PRN Clovis Fredrickson, MD   1 tablet at 12/22/15 0317  . risperiDONE (RISPERDAL) tablet 2 mg  2 mg Oral BID Clovis Fredrickson, MD   2 mg at 12/21/15 2052  . sucralfate (CARAFATE) tablet 1 g  1 g Oral TID WC & HS Analynn Daum B Loghan Subia, MD   1 g at 12/21/15 2052   PTA Medications: Prescriptions prior to admission  Medication Sig Dispense Refill Last Dose  . albuterol (PROVENTIL HFA) 108 (90 Base) MCG/ACT inhaler    Taking  . benzonatate (TESSALON PERLES) 100 MG capsule Take 100 mg by mouth.  Taking  . cyclobenzaprine (FLEXERIL) 10 MG tablet Take 10 mg by mouth.   Taking  . diclofenac (VOLTAREN) 75 MG EC tablet Take 75 mg by mouth.   Taking  . Diphenhyd-Hydrocort-Nystatin (FIRST-DUKES MOUTHWASH) SUSP Take 50m by mouth, swish and swallow three times a day 237 mL 3 Taking  . fluconazole (DIFLUCAN) 100 MG tablet Take 1 tablet (100 mg total) by mouth daily. 5 tablet 9   . fluticasone (FLONASE) 50 MCG/ACT nasal spray 1 spray by Each Nare route daily.   Taking  . lidocaine (XYLOCAINE) 5 % ointment Apply topically.   Taking  . loratadine (CLARITIN) 10 MG tablet Take 10 mg by mouth.   Taking  . nystatin (MYCOSTATIN) 100000 UNIT/ML suspension Take by mouth. Reported on 11/01/2015   Taking  . oxyCODONE-acetaminophen (PERCOCET) 7.5-325 MG tablet Take 1 tablet by mouth every 6 (six) hours as needed for severe pain. 40 tablet 0   . prochlorperazine (COMPAZINE) 10 MG tablet Take 1 tablet (10 mg total) by mouth every 6 (six) hours as needed for nausea or vomiting. 15 tablet 0   . Saline (AYR SALINE NASAL DROPS) 0.65 % (Soln) SOLN 2 drops by Each Nare route every four (4) hours as needed.   Taking  . sucralfate (CARAFATE) 1 g tablet Take 1 tablet (1 g total) by mouth 4 (four) times daily. Dissolve in 2-3 tbsp warm water,  swish and swallow. 120 tablet 3 Taking    Musculoskeletal: Strength & Muscle Tone: within normal limits Gait & Station: normal Patient leans: N/A  Psychiatric Specialty Exam: Physical Exam  Nursing note and vitals reviewed. Constitutional: She is oriented to person, place, and time. She appears well-developed and well-nourished.  HENT:  Head: Normocephalic and atraumatic.  Eyes: Conjunctivae and EOM are normal. Pupils are equal, round, and reactive to light.  Neck: Normal range of motion. Neck supple.  Cardiovascular: Normal rate, regular rhythm and normal heart sounds.   Respiratory: Effort normal and breath sounds normal.  GI: Soft. Bowel sounds are normal.  Musculoskeletal: Normal range of motion.  Neurological: She is alert and oriented to person, place, and time.  Skin: Skin is warm and dry.    Review of Systems  Musculoskeletal: Positive for back pain.  Psychiatric/Behavioral: Positive for hallucinations and substance abuse.  All other systems reviewed and are negative.   Blood pressure 132/87, pulse 92, temperature 98 F (36.7 C), temperature source Oral, resp. rate 16, SpO2 97 %.There is no weight on file to calculate BMI.  See SRA.                                                         Treatment Plan Summary: Daily contact with patient to assess and evaluate symptoms and progress in treatment and Medication management   Ms. TSnodgrassis a [age]year old female with a history of bipolar disorder and stage IV cancer admitted in the manic psychotic episode while off medications.   1. Mood and psychosis. We will start Depakote and Risperdal for mood stabilization.  2. Anxiety of OCD or PTSD type. I will start Luvox for OCD. We will consider Minipress for my first and flashbacks. She has been asking for Ativan. I will not prescribe benzodiazepines as she is on narcotic painkillers.  3. COPD. We will continue inhalers.  4. Smoking. Nicotine  patch  is available.  5. Insomnia. We will offer trazodone.  6. Chronic pain. She has been taking Voltaren and narcotic painkillers.   7. Thrush. She is on Diflucan and nystatin.  8. GERD. She is on Carafate.  9. Consequent treatment. We will ask oncology for consultation. The patient reports that she just completed dexamethasone taper.  10. Metabolic syndromes screening. Lipid panel, TSH, hemoglobin A1c and prolactin are pending.   11. She will bee discharged to home. She will folow up with [facility].   Observation Level/Precautions:  15 minute checks  Laboratory:  CBC Chemistry Profile UDS UA  Psychotherapy:    Medications:    Consultations:    Discharge Concerns:    Estimated LOS:  Other:     I certify that inpatient services furnished can reasonably be expected to improve the patient's condition.    Orson Slick, MD 7/8/20175:01 AM

## 2015-12-22 NOTE — Progress Notes (Signed)
[facility] MD Progress Note  12/22/2015 6:29 PM Charlene [name]  MRN:  [id number]  Subjective:  Charlene [name] is very somatic today complaining of aches and pains, demanding neck x-ray, ophthalmology consult, and more muscle relaxants. She does not appear to be in pain and moves about the unit rather comfortably. She would like to take care of all her needs, including getting glasses, while in the hospital. She was explained that it would not be possible and that we need to focus on her mental illness. Her agitation has resolved. She accepts medications and tolerates them well. Sleep and appetite are fair. Good group participation. She complains of cellulitis over her right wrist that has been treated with Vibra by her oncologist.  Principal Problem: <principal problem not specified> Diagnosis:   Patient Active Problem List   Diagnosis Date Noted  . Acute psychosis associated with endocrine, metabolic, or cerebrovascular disorder [F06.8, F01.50] 12/21/2015  . Cocaine use disorder, moderate, dependence ([place]) [F14.20] 12/21/2015  . Bipolar I disorder, most recent episode manic, severe with psychotic features ([place]) [F31.2] 12/21/2015  . OCD (obsessive compulsive disorder) [F42.9] 12/21/2015  . PTSD (post-traumatic stress disorder) [F43.10] 12/21/2015  . Abnormal cells of cervix [N87.9] 07/30/2015  . Encounter for general adult medical examination without abnormal findings [Z00.00] 07/28/2015  . Primary cancer of right lower lobe of lung ([place]) [C34.31] 07/12/2015  . At risk for noncompliance [Z87.898] 06/29/2015  . Spasm [R25.2] 06/26/2015  . Essential (primary) hypertension [I10] 06/21/2015  . Lung mass [R91.8] 06/21/2015  . Candida infection of mouth [B37.0] 06/21/2015  . Small cell carcinoma of lung ([place]) [C34.90] 06/21/2015  . Episodic mood disorder ([place]) [F39] 10/14/2014  . Chronic obstructive pulmonary disease ([place]) [J44.9] 10/06/2014  . Pulmonary emphysema ([place]) [J43.9] 10/06/2014  . LBP (low back  pain) [M54.5] 09/27/2014  . Tobacco use disorder [F17.200] 09/27/2014   Total Time spent with patient: 20 minutes  Past Psychiatric History: Bipolar disorder.  Past Medical History:  Past Medical History  Diagnosis Date  . Primary cancer of right lower lobe of lung ([place]) 07/12/2015    Small cell undifferentiated carcinoma of lung.  Diagnosis at [facility] by fine-needle aspiration of lymph node (January, 2017)  . Bipolar 2 disorder ([place])   . Borderline schizophrenia   . Falls infrequently     Past Surgical History  Procedure Laterality Date  . Abdominal hysterectomy    . Appendectomy    . Stomach surgery    . Nasal sinus surgery      x2  . Peripheral vascular catheterization N/A 07/30/2015    Procedure: Glori Luis Cath Insertion;  Surgeon: Algernon Huxley, MD;  Location: [facility];  Service: Cardiovascular;  Laterality: N/A;   Family History: History reviewed. No pertinent family history. Family Psychiatric  History: See H&P. Social History:  History  Alcohol Use No     History  Drug Use  . Yes  . Special: Cocaine    Comment: States due to cocaine in mouthwash    Social History   Social History  . Marital Status: Divorced    Spouse Name: N/A  . Number of Children: N/A  . Years of Education: N/A   Social History Main Topics  . Smoking status: Former Smoker -- 0.50 packs/day for 30 years    Quit date: 06/17/2015  . Smokeless tobacco: None  . Alcohol Use: No  . Drug Use: Yes    Special: Cocaine     Comment: States due to cocaine  in mouthwash  . Sexual Activity: Not Asked   Other Topics Concern  . None   Social History Narrative   Additional Social History:                         Sleep: Fair  Appetite:  Fair  Current Medications: Current Facility-Administered Medications  Medication Dose Route Frequency Provider Last Rate Last Dose  . albuterol (PROVENTIL HFA;VENTOLIN HFA) 108 (90 Base) MCG/ACT inhaler 2 puff  2 puff Inhalation Q4H  PRN Clovis Fredrickson, MD   2 puff at 12/21/15 2049  . cyclobenzaprine (FLEXERIL) tablet 10 mg  10 mg Oral Daily Clovis Fredrickson, MD   10 mg at 12/22/15 0859  . diclofenac (VOLTAREN) EC tablet 75 mg  75 mg Oral BID Janalee Grobe B Richele Strand, MD   75 mg at 12/22/15 0900  . divalproex (DEPAKOTE) DR tablet 500 mg  500 mg Oral Q8H Tremeka Helbling B Loribeth Katich, MD   500 mg at 12/22/15 1240  . fluconazole (DIFLUCAN) tablet 100 mg  100 mg Oral Daily Maribelle Hopple B Janiel Derhammer, MD   100 mg at 12/22/15 0900  . fluticasone (FLONASE) 50 MCG/ACT nasal spray 2 spray  2 spray Each Nare Daily Ariana Juul B Maston Wight, MD      . fluvoxaMINE (LUVOX) tablet 50 mg  50 mg Oral QHS Clovis Fredrickson, MD   50 mg at 12/21/15 2052  . guaiFENesin-dextromethorphan (ROBITUSSIN DM) 100-10 MG/5ML syrup 5 mL  5 mL Oral Q4H PRN Clovis Fredrickson, MD   5 mL at 12/22/15 0904  . loratadine (CLARITIN) tablet 10 mg  10 mg Oral Daily Clovis Fredrickson, MD   10 mg at 12/22/15 0859  . magic mouthwash w/lidocaine  10 mL Oral QID PRN Brookelynne Dimperio B Brixon Zhen, MD      . nystatin (MYCOSTATIN) 100000 UNIT/ML suspension 500,000 Units  5 mL Oral QID Clovis Fredrickson, MD   500,000 Units at 12/22/15 1800  . oxyCODONE-acetaminophen (PERCOCET) 7.5-325 MG per tablet 1 tablet  1 tablet Oral Q6H PRN Clovis Fredrickson, MD   1 tablet at 12/22/15 1545  . risperiDONE (RISPERDAL) tablet 2 mg  2 mg Oral BID Clovis Fredrickson, MD   2 mg at 12/22/15 0859  . sucralfate (CARAFATE) tablet 1 g  1 g Oral TID WC & HS Yovani Cogburn B Rogen Porte, MD   1 g at 12/22/15 1800    Lab Results:  Results for orders placed or performed during the hospital encounter of 12/21/15 (from the past 48 hour(s))  Hemoglobin A1c     Status: Abnormal   Collection Time: 12/22/15  6:31 AM  Result Value Ref Range   Hgb A1c MFr Bld 6.2 (H) 4.0 - 6.0 %  Lipid panel     Status: Abnormal   Collection Time: 12/22/15  6:31 AM  Result Value Ref Range   Cholesterol 179 0 - 200 mg/dL    Triglycerides 246 (H) <150 mg/dL   HDL 52 >40 mg/dL   Total CHOL/HDL Ratio 3.4 RATIO   VLDL 49 (H) 0 - 40 mg/dL   LDL Cholesterol 78 0 - 99 mg/dL    Comment:        Total Cholesterol/HDL:CHD Risk Coronary Heart Disease Risk Table                     Men   Women  1/2 Average Risk   3.4   3.3  Average Risk  5.0   4.4  2 X Average Risk   9.6   7.1  3 X Average Risk  23.4   11.0        Use the calculated Patient Ratio above and the CHD Risk Table to determine the patient's CHD Risk.        ATP III CLASSIFICATION (LDL):  <100     mg/dL   Optimal  100-129  mg/dL   Near or Above                    Optimal  130-159  mg/dL   Borderline  160-189  mg/dL   High  >190     mg/dL   Very High   TSH     Status: None   Collection Time: 12/22/15  6:31 AM  Result Value Ref Range   TSH 2.771 0.350 - 4.500 uIU/mL    Blood Alcohol level:  No results found for: [facility]  Metabolic Disorder Labs: Lab Results  Component Value Date   HGBA1C 6.2* 12/22/2015   No results found for: PROLACTIN Lab Results  Component Value Date   CHOL 179 12/22/2015   TRIG 246* 12/22/2015   HDL 52 12/22/2015   CHOLHDL 3.4 12/22/2015   VLDL 49* 12/22/2015   LDLCALC 78 12/22/2015    Physical Findings: AIMS: Facial and Oral Movements Muscles of Facial Expression: None, normal Lips and Perioral Area: None, normal Jaw: None, normal Tongue: None, normal,Extremity Movements Upper (arms, wrists, hands, fingers): None, normal Lower (legs, knees, ankles, toes): None, normal, Trunk Movements Neck, shoulders, hips: None, normal, Overall Severity Severity of abnormal movements (highest score from questions above): None, normal Incapacitation due to abnormal movements: None, normal Patient's awareness of abnormal movements (rate only patient's report): No Awareness, Dental Status Current problems with teeth and/or dentures?: No Does patient usually wear dentures?: No  CIWA:    COWS:      Musculoskeletal: Strength & Muscle Tone: within normal limits Gait & Station: unsteady Patient leans: N/A  Psychiatric Specialty Exam: Physical Exam  Nursing note and vitals reviewed.   Review of Systems  Musculoskeletal: Positive for back pain.  Skin: Positive for rash.       Swelling of right hand with redness.  Psychiatric/Behavioral: Positive for depression, hallucinations and substance abuse.  All other systems reviewed and are negative.   Blood pressure 133/79, pulse 110, temperature 98.5 F (36.9 C), temperature source Oral, resp. rate 16, height '5\' 8"'$  (1.727 m), weight 67.586 kg (149 lb), SpO2 97 %.Body mass index is 22.66 kg/(m^2).  General Appearance: Casual  Eye Contact:  Good  Speech:  Clear and Coherent  Volume:  Normal  Mood:  Anxious  Affect:  Congruent  Thought Process:  Goal Directed  Orientation:  Full (Time, Place, and Person)  Thought Content:  Delusions and Paranoid Ideation  Suicidal Thoughts:  No  Homicidal Thoughts:  No  Memory:  Immediate;   Fair Recent;   Fair Remote;   Fair  Judgement:  Impaired  Insight:  Shallow  Psychomotor Activity:  Normal  Concentration:  Concentration: Fair and Attention Span: Fair  Recall:  [employer] of Knowledge:  Fair  Language:  Fair  Akathisia:  No  Handed:  Right  AIMS (if indicated):     Assets:  Communication Skills Desire for Improvement Housing Resilience Social Support  ADL's:  Intact  Cognition:  WNL  Sleep:  Number of Hours: 3.25     Treatment Plan Summary: Daily contact  with patient to assess and evaluate symptoms and progress in treatment and Medication management   Ms. Melle is a 55 year old female with a history of bipolar disorder and stage IV cancer admitted in the manic psychotic episode while off medications.   1. Mood and psychosis. We will start Depakote and Risperdal for mood stabilization. VPA level on Monday.  2. Anxiety of OCD or PTSD type. I continue Luvox at 100 mg for  OCD. We will consider Minipress for nightmares and flashbacks. She has been asking for Ativan. I will not prescribe benzodiazepines as she is on narcotic painkillers.  3. COPD. We will continue inhalers.  4. Smoking. Nicotine patch is available.  5. Insomnia. We will offer trazodone.  6. Chronic pain. She has been taking Voltaren and narcotic painkillers. We increase Flexeril to 10 mg three times daily.  7. Thrush. She is on Diflucan and nystatin.  8. GERD. She is on Carafate.  9. Cancer treatment. We will ask oncology for consultation. The patient reports that she just completed dexamethasone taper and that she is cured.  10. Metabolic syndrome screening. Lipid panel, TSH, hemoglobin A1c are normal. Prolactin 32.    11. Cellulitis. We will start Vibramycin and bactroban.  12. She will bee discharged to home. She will folow up with [facility].  Orson Slick, MD 12/22/2015, 6:29 PM
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
## 2015-12-23 LAB — PROLACTIN: PROLACTIN: 32.4 ng/mL — AB (ref 4.8–23.3)

## 2015-12-23 MED ORDER — DOXYCYCLINE HYCLATE 100 MG PO TABS
100.0000 mg | ORAL_TABLET | Freq: Two times a day (BID) | ORAL | Status: DC
  Administered 2015-12-23 – 2015-12-30 (×15): 100 mg via ORAL
  Filled 2015-12-23 (×17): qty 1

## 2015-12-23 MED ORDER — MUPIROCIN 2 % EX OINT
TOPICAL_OINTMENT | Freq: Two times a day (BID) | CUTANEOUS | Status: DC
  Administered 2015-12-23 – 2015-12-24 (×2): via TOPICAL
  Filled 2015-12-23: qty 22

## 2015-12-23 MED ORDER — DOXYCYCLINE HYCLATE 50 MG PO CAPS
50.0000 mg | ORAL_CAPSULE | Freq: Two times a day (BID) | ORAL | Status: DC
  Filled 2015-12-23: qty 1

## 2015-12-23 NOTE — Progress Notes (Signed)
Patient alert and oriented, she went to dayroom to eat breakfast this am, took all am medications without difficulty, she did complain of back pain, which is chronic, Patient has bright affect today, states she emotionally feels better, denies Si/HI,or avh, Patient encouraged to call for assist if anything needed or any concerns, Patients ambulates with walker. Fall precautions in place. Patient is encouraged to go to groups today.
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
## 2015-12-23 NOTE — BHH Group Notes (Signed)
[place] Group Notes:  (Nursing/MHT/Case Management/Adjunct)  Date:  12/23/2015  Time:  2:50 AM  Type of Therapy:  Psychoeducational Skills  Participation Level:  Did Not Attend  Summary of Progress/Problems:  Charlene [name] 12/23/2015, 2:50 AM

## 2015-12-23 NOTE — Progress Notes (Signed)
Charlene [name] had a good shift ,she spent most of the evening in her room she did get up and go to the dayroom a few times during the evening. She voiced no delusions and she had no behavioral issues on shift and was medication complaint. She was cooperative with treatment. Patient eyes closed resting in bed at this time.

## 2015-12-23 NOTE — BHH Group Notes (Signed)
[facility] LCSW Group Therapy  12/23/2015 2:04 PM  Type of Therapy:  Group Therapy  Participation Level:  Did Not Attend  Modes of Intervention:  Discussion, Education, Socialization and Support  Summary of Progress/Problems: Pt will identify unhealthy thoughts and how they impact their emotions and behavior. Pt will be encouraged to discuss these thoughts, emotions and behaviors with the group.   Brevig Mission MSW, [place]  12/23/2015, 2:04 PM

## 2015-12-24 LAB — VALPROIC ACID LEVEL: Valproic Acid Lvl: 32 ug/mL — ABNORMAL LOW (ref 50.0–100.0)

## 2015-12-24 MED ORDER — DIVALPROEX SODIUM 500 MG PO DR TAB
1000.0000 mg | DELAYED_RELEASE_TABLET | Freq: Two times a day (BID) | ORAL | Status: DC
  Administered 2015-12-24 – 2015-12-30 (×13): 1000 mg via ORAL
  Filled 2015-12-24 (×13): qty 2

## 2015-12-24 MED ORDER — MUPIROCIN 2 % EX OINT
TOPICAL_OINTMENT | Freq: Four times a day (QID) | CUTANEOUS | Status: DC
  Administered 2015-12-24 – 2015-12-25 (×7): via TOPICAL
  Administered 2015-12-26 (×2): 1 via TOPICAL
  Administered 2015-12-26 (×2): via TOPICAL
  Administered 2015-12-27: 1 via TOPICAL
  Administered 2015-12-27 (×2): via TOPICAL
  Administered 2015-12-27 – 2015-12-28 (×4): 1 via TOPICAL
  Administered 2015-12-28: 17:00:00 via TOPICAL
  Administered 2015-12-29: 1 via TOPICAL
  Administered 2015-12-29 (×2): via TOPICAL
  Filled 2015-12-24 (×2): qty 22

## 2015-12-24 MED ORDER — TRAZODONE HCL 50 MG PO TABS: 50.0000 mg | ORAL_TABLET | Freq: Every day | ORAL | Status: DC

## 2015-12-24 MED ORDER — TEMAZEPAM 15 MG PO CAPS
15.0000 mg | ORAL_CAPSULE | Freq: Every day | ORAL | Status: DC
  Administered 2015-12-24 – 2015-12-29 (×6): 15 mg via ORAL
  Filled 2015-12-24 (×6): qty 1

## 2015-12-24 MED ORDER — FLUVOXAMINE MALEATE 50 MG PO TABS
150.0000 mg | ORAL_TABLET | Freq: Every day | ORAL | Status: DC
  Administered 2015-12-24 – 2015-12-27 (×4): 150 mg via ORAL
  Filled 2015-12-24 (×4): qty 3

## 2015-12-24 NOTE — BHH Group Notes (Signed)
[place] Group Notes:  (Nursing/MHT/Case Management/Adjunct)  Date:  12/24/2015  Time:  9:17 PM  Type of Therapy:  Group Therapy  Participation Level:  Active  Participation Quality:  Appropriate  Affect:  Appropriate  Cognitive:  Appropriate  Insight:  Appropriate  Engagement in Group:  Engaged  Modes of Intervention:  Discussion  Summary of Progress/Problems:  Charlene [name] 12/24/2015, 9:17 PM

## 2015-12-24 NOTE — Progress Notes (Signed)
Patient with bright affect, bright mood and cooperative behavior with meal, meds and plan of care. No SI/HI at this time. Social with peers. Verbalizes needs well with staff. Therapy groups encouraged. Completes daily audit sheet with be a blessing as her daily goal. Safety maintained.

## 2015-12-24 NOTE — BHH Group Notes (Signed)
[place] LCSW Group Therapy   12/24/2015  9:30 AM  Type of Therapy: Group Therapy   Participation Level: Did Not Attend. Patient invited to participate but declined.    Alphonse Guild. Brecken Walth, MSW, LCSWA, LCAS

## 2015-12-24 NOTE — Evaluation (Signed)
Physical Therapy Evaluation Patient Details Name: Charlene [name] MRN: [id number] DOB: September 05, 1960 Today's Date: 12/24/2015   History of Present Illness  Pt is a pleasant 55 y/o female admitted under IVC to behavioral health unit. Patient was admitted with agitated, combative behavior with disorganized and psychotic thinking. She has a history of cancer and recently finished radiation tx.   Clinical Impression  Patient is a difficult historian, however she appears to be at a recent baseline regarding her mobility. She reports not using a RW at home, however falling multiple times. She appears to be an imminent falls risk w/o AD. She ambulates with flexed knee gait, flexed thoracic spine. She reports chronic L shoulder and thoracic pain, unclear if this is postural or related to underlying metastatic disorder. She reports episodes of dizziness associated with new medications, recent ear infection. If she continues to have dizziness once medications are adjusted, would recommend consult with ENT. Would recommend RW and outpatient therapy at this point to address mobility and balance deficits.     Follow Up Recommendations Outpatient PT    Equipment Recommendations  Rolling walker with 5" wheels    Recommendations for Other Services  (ENT consult if patient continues to have reports of dizziness.)     Precautions / Restrictions Precautions Precautions: Fall Restrictions Weight Bearing Restrictions: No      Mobility  Bed Mobility Overal bed mobility: Independent             General bed mobility comments: No deficits identified.   Transfers Overall transfer level: Needs assistance Equipment used: Rolling walker (2 wheeled) Transfers: Sit to/from Stand Sit to Stand: Min guard         General transfer comment: Delayed/prolonged time to complete from low bed, mild loss of balance noted from low bed position.   Ambulation/Gait Ambulation/Gait assistance: Modified independent  (Device/Increase time) Ambulation Distance (Feet): 300 Feet Assistive device: Rolling walker (2 wheeled)     Gait velocity interpretation: Below normal speed for age/gender General Gait Details: Patient ambulates with flexed knees, flexed trunk with RW and without AD. She shuffles her feet and would be imminent falls risk without AD. She has one transient episode of dizziness without AD, no other loss of balance noted.   Stairs            Wheelchair Mobility    Modified Rankin (Stroke Patients Only)       Balance Overall balance assessment: Needs assistance Sitting-balance support: No upper extremity supported Sitting balance-Leahy Scale: Normal     Standing balance support: Bilateral upper extremity supported Standing balance-Leahy Scale: Good Standing balance comment: She shuffles her feet with RW, appears improved from no AD.                              Pertinent Vitals/Pain Pain Assessment: Faces Faces Pain Scale: Hurts a little bit Pain Location: Neck, L shoulder -- chronic.  Pain Descriptors / Indicators: Aching Pain Intervention(s): Limited activity within patient's tolerance    Home Living Family/patient expects to be discharged to:: Private residence Living Arrangements: Parent Available Help at Discharge: Family Type of Home: House Home Access:  (Did not specify)       Home Equipment: Walker - 2 wheels Additional Comments: Believe she has RW, unclear at this time.     Prior Function Level of Independence: Independent         Comments: Patient reports increased rates of falling recently at  home.      Hand Dominance        Extremity/Trunk Assessment   Upper Extremity Assessment: Overall WFL for tasks assessed           Lower Extremity Assessment: Overall WFL for tasks assessed (Able to balance on SLS ~ 2-3" bilaterally. Roughly symmetrical. )         Communication   Communication: No difficulties  Cognition  Arousal/Alertness: Awake/alert Behavior During Therapy: WFL for tasks assessed/performed Overall Cognitive Status: History of cognitive impairments - at baseline (She has a history of psychiatric disoders, appears to be at baseline. )                      General Comments General comments (skin integrity, edema, etc.): R hand cellulitis.     Exercises        Assessment/Plan    PT Assessment Patient needs continued PT services  PT Diagnosis Difficulty walking;Generalized weakness   PT Problem List Decreased strength;Decreased knowledge of use of DME;Decreased balance;Decreased mobility  PT Treatment Interventions DME instruction;Gait training;Stair training;Therapeutic activities;Therapeutic exercise;Balance training   PT Goals (Current goals can be found in the Care Plan section) Acute Rehab PT Goals Patient Stated Goal: To return home  PT Goal Formulation: With patient Time For Goal Achievement: 01/07/16 Potential to Achieve Goals: Good    Frequency Min 2X/week   Barriers to discharge        Co-evaluation               End of Session Equipment Utilized During Treatment: Gait belt Activity Tolerance: Patient tolerated treatment well Patient left: in bed Nurse Communication: Mobility status    Functional Assessment Tool Used: Clinical observation  Functional Limitation: Mobility: Walking and moving around Mobility: Walking and Moving Around Current Status (W4665): At least 1 percent but less than 20 percent impaired, limited or restricted Mobility: Walking and Moving Around Goal Status [phone]): At least 1 percent but less than 20 percent impaired, limited or restricted    Time: 1330-1345 PT Time Calculation (min) (ACUTE ONLY): 15 min   Charges:   PT Evaluation $PT Eval Moderate Complexity: 1 Procedure     PT G Codes:   PT G-Codes **NOT FOR INPATIENT CLASS** Functional Assessment Tool Used: Clinical observation  Functional Limitation: Mobility:  Walking and moving around Mobility: Walking and Moving Around Current Status (S1779): At least 1 percent but less than 20 percent impaired, limited or restricted Mobility: Walking and Moving Around Goal Status [phone]): At least 1 percent but less than 20 percent impaired, limited or restricted   Kerman Passey, PT, DPT    12/24/2015, 4:30 PM

## 2015-12-24 NOTE — Progress Notes (Signed)
D: Pt denies SI/HI/AVH. Pt is pleasant and cooperative, affect is flat, mood is sad and pt was sometimes tearful during writer's interaction with patient .Pt expressed joy and relief that she is cancer free. Patient appears less anxious and she is interacting with peers and staff appropriately. Patient has expressed strong religious beliefs.  A: Pt was offered support and encouragement. Pt was given scheduled medications. Pt was encouraged to attend groups. Q 15 minute checks were done for safety.  R:Pt did not attend group. Pt is complaint with medication. Pt has no complaints.Pt receptive to treatment and safety maintained on unit.

## 2015-12-24 NOTE — BHH Group Notes (Signed)
[place] LCSW Group Therapy   12/24/2015 1pm Type of Therapy: Group Therapy   Participation Level: Active   Participation Quality: Attentive, Sharing and Supportive   Affect: Appropriate  Cognitive: Alert and Oriented   Insight: Developing/Improving and Engaged   Engagement in Therapy: Developing/Improving and Engaged   Modes of Intervention: Clarification, Confrontation, Discussion, Education, Exploration,  Limit-setting, Orientation, Problem-solving, Rapport Building, Art therapist, Socialization and Support   Summary of Progress/Problems: Pt identified obstacles faced currently and processed barriers involved in overcoming these obstacles. Pt identified steps necessary for overcoming these obstacles and explored motivation (internal and external) for facing these difficulties head on. Pt further identified one area of concern in their lives and chose a goal to focus on for today. Pt shared the pt hopes to find recovery with the pt's mental health issues and identifies practicing gratitude and getting proper assistance from doctors in this matter are key to the pt's recovery. Pt shared the steps that the pt would need to take in order to avoid being hospitalized again and identified that medication management and therapy is key in avoiding being re-admitted. Pt reported she is choosing to focus spiritual matters, as well as her recovery and that the believes this will assist the pt in the future. Pt was polite and cooperative with the CSW and other group members and focused and attentive to the topics discussed and the sharing of others.   Alphonse Guild. Kasie Leccese, LCSWA, LCAS

## 2015-12-24 NOTE — Plan of Care (Signed)
Problem: Education: Goal: Knowledge of [place] [facility] Education information/materials will improve Outcome: Progressing Cooperative with plan of care.

## 2015-12-24 NOTE — BHH Group Notes (Signed)
[place] Group Notes:  (Nursing/MHT/Case Management/Adjunct)  Date:  12/24/2015  Time:  5:33 PM  Type of Therapy:  Psychoeducational Skills  Participation Level:  Did Not Attend  Charise Killian 12/24/2015, 5:33 PM

## 2015-12-24 NOTE — Progress Notes (Addendum)
[facility] MD Progress Note  12/24/2015 4:00 AM Charlene [name]  MRN:  [id number]  Subjective:  Ms. Seckinger feels blessed today. Her mood is more stable, affect is brighter. She feels that the dose of trazodone is too high and she feels sedated in the morning. For the past 2 days she slept no more than 3-4 hours. She now uses a walker. She feels unsteady on her feet. We will ask PT to evaluate. She complains of neck pain and is asking for x-rays. On admission she told me that she already seeked help for her neck problems and was told that no surgery can be done due to her history of cancer. Depakote level is subtherapeutic and we will increase the dose. Swelling on her hand is improving with antibiotic treatment. She is out in the community and participate in programming. She tolerates medications well.  Principal Problem: Bipolar I disorder, most recent episode manic, severe with psychotic features ([place]) Diagnosis:   Patient Active Problem List   Diagnosis Date Noted  . Acute psychosis associated with endocrine, metabolic, or cerebrovascular disorder [F06.8, F01.50] 12/21/2015  . Cocaine use disorder, moderate, dependence ([place]) [F14.20] 12/21/2015  . Bipolar I disorder, most recent episode manic, severe with psychotic features ([place]) [F31.2] 12/21/2015  . OCD (obsessive compulsive disorder) [F42.9] 12/21/2015  . PTSD (post-traumatic stress disorder) [F43.10] 12/21/2015  . Abnormal cells of cervix [N87.9] 07/30/2015  . Encounter for general adult medical examination without abnormal findings [Z00.00] 07/28/2015  . Primary cancer of right lower lobe of lung ([place]) [C34.31] 07/12/2015  . At risk for noncompliance [Z87.898] 06/29/2015  . Spasm [R25.2] 06/26/2015  . Essential (primary) hypertension [I10] 06/21/2015  . Lung mass [R91.8] 06/21/2015  . Candida infection of mouth [B37.0] 06/21/2015  . 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 Episodic mood disorder ([place]) [F39] 10/14/2014  .  Chronic obstructive pulmonary disease ([place]) [J44.9] 10/06/2014  . Pulmonary emphysema ([place]) [J43.9] 10/06/2014  . LBP (low back pain) [M54.5] 09/27/2014  . Tobacco use disorder [F17.200] 09/27/2014   Total Time spent with patient: 20 minutes  Past Psychiatric History: Bipolar disorder.  Past Medical History:  Past Medical History  Diagnosis Date  . Primary cancer of right lower lobe of lung ([place]) 07/12/2015    Small cell undifferentiated carcinoma of lung.  Diagnosis at [facility] by fine-needle aspiration of lymph node (January, 2017)  . Bipolar 2 disorder ([place])   . Borderline schizophrenia   . Falls infrequently     Past Surgical History  Procedure Laterality Date  . Abdominal hysterectomy    . Appendectomy    . Stomach surgery    . Nasal sinus surgery      x2  . Peripheral vascular catheterization N/A 07/30/2015    Procedure: Glori Luis Cath Insertion;  Surgeon: Algernon Huxley, MD;  Location: [facility];  Service: Cardiovascular;  Laterality: N/A;   Family History: History reviewed. No pertinent family history. Family Psychiatric  History: See H&P. Social History:  History  Alcohol Use No     History  Drug Use  . Yes  . Special: Cocaine    Comment: States due to cocaine in mouthwash    Social History   Social History  . Marital Status: Divorced    Spouse Name: N/A  . Number of Children: N/A  . Years of Education: N/A   Social History Main Topics  . Smoking status: Former Smoker -- 0.50 packs/day for 30 years  Quit date: 06/17/2015  . Smokeless tobacco: None  . Alcohol Use: No  . Drug Use: Yes    Special: Cocaine     Comment: States due to cocaine in mouthwash  . Sexual Activity: Not Asked   Other Topics Concern  . None   Social History Narrative   Additional Social History:                         Sleep: Fair  Appetite:  Fair  Current Medications: Current Facility-Administered Medications  Medication Dose Route Frequency  Provider Last Rate Last Dose  . albuterol (PROVENTIL HFA;VENTOLIN HFA) 108 (90 Base) MCG/ACT inhaler 2 puff  2 puff Inhalation Q4H PRN Clovis Fredrickson, MD   2 puff at 12/23/15 1825  . cyclobenzaprine (FLEXERIL) tablet 10 mg  10 mg Oral TID Clovis Fredrickson, MD   10 mg at 12/23/15 2048  . diclofenac (VOLTAREN) EC tablet 75 mg  75 mg Oral BID Clovis Fredrickson, MD   75 mg at 12/23/15 2047  . divalproex (DEPAKOTE) DR tablet 500 mg  500 mg Oral Q8H Laquenta Whitsell B Neliah Cuyler, MD   500 mg at 12/23/15 2048  . doxycycline (VIBRA-TABS) tablet 100 mg  100 mg Oral Q12H Alora Gorey B Enrique Weiss, MD   100 mg at 12/23/15 2049  . fluconazole (DIFLUCAN) tablet 100 mg  100 mg Oral Daily Aften Lipsey B Javeah Loeza, MD   100 mg at 12/23/15 0843  . fluticasone (FLONASE) 50 MCG/ACT nasal spray 2 spray  2 spray Each Nare Daily Clovis Fredrickson, MD   2 spray at 12/23/15 0844  . fluvoxaMINE (LUVOX) tablet 100 mg  100 mg Oral QHS Clovis Fredrickson, MD   100 mg at 12/23/15 2047  . guaiFENesin-dextromethorphan (ROBITUSSIN DM) 100-10 MG/5ML syrup 5 mL  5 mL Oral Q4H PRN Clovis Fredrickson, MD   5 mL at 12/23/15 0848  . loratadine (CLARITIN) tablet 10 mg  10 mg Oral Daily Clovis Fredrickson, MD   10 mg at 12/23/15 0842  . magic mouthwash w/lidocaine  10 mL Oral QID PRN Vernadine Coombs B Izzah Pasqua, MD      . mupirocin ointment (BACTROBAN) 2 %   Topical BID Zamiya Dillard B Avyon Herendeen, MD      . nystatin (MYCOSTATIN) 100000 UNIT/ML suspension 500,000 Units  5 mL Oral QID Clovis Fredrickson, MD   500,000 Units at 12/23/15 1704  . oxyCODONE-acetaminophen (PERCOCET) 7.5-325 MG per tablet 1 tablet  1 tablet Oral Q6H PRN Clovis Fredrickson, MD   1 tablet at 12/24/15 0049  . risperiDONE (RISPERDAL) tablet 2 mg  2 mg Oral BID Clovis Fredrickson, MD   2 mg at 12/23/15 2048  . sucralfate (CARAFATE) tablet 1 g  1 g Oral TID WC & HS Nyaja [place] B Tawonda Legaspi, MD   1 g at 12/23/15 2050  . traZODone (DESYREL) tablet 100 mg  100 mg Oral QHS Aubriella Perezgarcia  B Alauna Hayden, MD   100 mg at 12/23/15 2048    Lab Results:  Results for orders placed or performed during the hospital encounter of 12/21/15 (from the past 48 hour(s))  Hemoglobin A1c     Status: Abnormal   Collection Time: 12/22/15  6:31 AM  Result Value Ref Range   Hgb A1c MFr Bld 6.2 (H) 4.0 - 6.0 %  Lipid panel     Status: Abnormal   Collection Time: 12/22/15  6:31 AM  Result Value Ref Range   Cholesterol 179 0 -  200 mg/dL   Triglycerides 246 (H) <150 mg/dL   HDL 52 >40 mg/dL   Total CHOL/HDL Ratio 3.4 RATIO   VLDL 49 (H) 0 - 40 mg/dL   LDL Cholesterol 78 0 - 99 mg/dL    Comment:        Total Cholesterol/HDL:CHD Risk Coronary Heart Disease Risk Table                     Men   Women  1/2 Average Risk   3.4   3.3  Average Risk       5.0   4.4  2 X Average Risk   9.6   7.1  3 X Average Risk  23.4   11.0        Use the calculated Patient Ratio above and the CHD Risk Table to determine the patient's CHD Risk.        ATP III CLASSIFICATION (LDL):  <100     mg/dL   Optimal  100-129  mg/dL   Near or Above                    Optimal  130-159  mg/dL   Borderline  160-189  mg/dL   High  >190     mg/dL   Very High   Prolactin     Status: Abnormal   Collection Time: 12/22/15  6:31 AM  Result Value Ref Range   Prolactin 32.4 (H) 4.8 - 23.3 ng/mL    Comment: (NOTE) Performed At: [facility] [place], [place] [id number] Lindon Romp MD MW:[id number]   TSH     Status: None   Collection Time: 12/22/15  6:31 AM  Result Value Ref Range   TSH 2.771 0.350 - 4.500 uIU/mL    Blood Alcohol level:  No results found for: [facility]  Metabolic Disorder Labs: Lab Results  Component Value Date   HGBA1C 6.2* 12/22/2015   Lab Results  Component Value Date   PROLACTIN 32.4* 12/22/2015   Lab Results  Component Value Date   CHOL 179 12/22/2015   TRIG 246* 12/22/2015   HDL 52 12/22/2015   CHOLHDL 3.4 12/22/2015   VLDL 49* 12/22/2015   LDLCALC 78 12/22/2015     Physical Findings: AIMS: Facial and Oral Movements Muscles of Facial Expression: None, normal Lips and Perioral Area: None, normal Jaw: None, normal Tongue: None, normal,Extremity Movements Upper (arms, wrists, hands, fingers): None, normal Lower (legs, knees, ankles, toes): None, normal, Trunk Movements Neck, shoulders, hips: None, normal, Overall Severity Severity of abnormal movements (highest score from questions above): None, normal Incapacitation due to abnormal movements: None, normal Patient's awareness of abnormal movements (rate only patient's report): No Awareness, Dental Status Current problems with teeth and/or dentures?: No Does patient usually wear dentures?: No  CIWA:    COWS:     Musculoskeletal: Strength & Muscle Tone: within normal limits and decreased Gait & Station: normal, unsteady Patient leans: N/A  Psychiatric Specialty Exam: Physical Exam  Nursing note and vitals reviewed.   Review of Systems  Musculoskeletal: Positive for neck pain.  Psychiatric/Behavioral: Positive for depression, suicidal ideas and substance abuse.  All other systems reviewed and are negative.   Blood pressure 117/80, pulse 124, temperature 99.2 F (37.3 C), temperature source Oral, resp. rate 20, height '5\' 8"'$  (1.727 m), weight 67.586 kg (149 lb), SpO2 97 %.Body mass index is 22.66 kg/(m^2).  General Appearance: Casual  Eye Contact:  Good  Speech:  Clear and Coherent  Volume:  Normal  Mood:  Anxious  Affect:  Appropriate  Thought Process:  Goal Directed  Orientation:  Full (Time, Place, and Person)  Thought Content:  WDL  Suicidal Thoughts:  No  Homicidal Thoughts:  No  Memory:  Immediate;   Fair Recent;   Fair Remote;   Fair  Judgement:  Fair  Insight:  Fair  Psychomotor Activity:  Decreased  Concentration:  Concentration: Fair and Attention Span: Fair  Recall:  [employer] of Knowledge:  Fair  Language:  Fair  Akathisia:  No  Handed:  Right  AIMS (if  indicated):     Assets:  Communication Skills Desire for Improvement Housing Resilience Social Support  ADL's:  Intact  Cognition:  WNL  Sleep:  Number of Hours: 4     Treatment Plan Summary: Daily contact with patient to assess and evaluate symptoms and progress in treatment and Medication management   Ms. Staley is a 56 year old female with a history of bipolar disorder and stage IV cancer admitted in the manic psychotic episode while off medications.   1. Mood and psychosis. We will increase Depakote to 100 mg bid for mood stabilization and continue Risperdal for psychosis. VPA 33.  2. Anxiety of OCD or PTSD type. We will increase Luvox to 150 mg for OCD. We will consider Minipress for nightmares and flashbacks. She has been asking for Ativan. I will not prescribe benzodiazepines as she is on narcotic painkillers.  3. COPD. We will continue inhalers.  4. Smoking. Nicotine patch is available.  5. Insomnia. She dis not respond to Trazodone. We start Restoril.   6. Chronic pain. She has been taking Voltaren and narcotic painkillers. We increased Flexeril to 10 mg three times daily. She reports neck pain from recent fall.   7. Thrush. She is on Diflucan and nystatin.  8. GERD. She is on Carafate.  9. Cancer treatment. We will ask oncology for consultation. The patient reports that she just completed dexamethasone taper and that she is cured.  10. Metabolic syndrome screening. Lipid panel, TSH, hemoglobin A1c are normal. Prolactin 32.   11. Cellulitis. We started Vibramycin and bactroban.  12. Deconditioning. We will ask for PT assessment.   13. Disposition. She will bee discharged to home. She will folow up with [facility].  Orson Slick, MD 12/24/2015, 4:00 AM

## 2015-12-25 NOTE — Progress Notes (Signed)
.  D Patient stated she was putting lotion on  her feet and her foot slide inside the  flip flop. He  Slid to floor hitting her knee. Patient  Stated no harm . Patient started back to walking on unit . Patient using a walker.  A: No breaks in skin . Slightly pink R : Staff continue to monitor  Patient  Encourage to use her  Monitor  Attached to the  [facility]

## 2015-12-25 NOTE — BHH Group Notes (Signed)
[place] Group Notes:  (Nursing/MHT/Case Management/Adjunct)  Date:  12/25/2015  Time:  2:04 PM  Type of Therapy:  Psychoeducational Skills  Participation Level:  Active  Participation Quality:  Intrusive, Monopolizing and Redirectable  Affect:  Appropriate  Cognitive:  Oriented  Insight:  Limited  Engagement in Group:  Monopolizing and Off Topic  Modes of Intervention:  Activity, Discussion and Education  Summary of Progress/Problems:  Charise Killian 12/25/2015, 2:04 PM

## 2015-12-25 NOTE — Tx Team (Signed)
Interdisciplinary Treatment Plan Update (Adult)  Date:  12/25/2015 Time Reviewed:  3:08 PM  Progress in Treatment: Attending groups: Yes. Participating in groups:  Yes. Taking medication as prescribed:  Yes. Tolerating medication:  Yes. Family/Significant othe contact made:  No, will contact:   parents Patient understands diagnosis:  Yes. Discussing patient identified problems/goals with staff:  Yes. Medical problems stabilized or resolved:  Yes. Denies suicidal/homicidal ideation: Yes. Issues/concerns per patient self-inventory:  No. Other:  New problem(s) identified: No, Describe:     Discharge Plan or [place] home and follow up with [facility] outpatient  Reason for Continuation of Hospitalization: Mania Medication stabilization  Comments:Charlene [name] continues to improve. She is no longer agitated and her thoughts are better organized she however still remains hypomanic, euphoric, hyperactive, intrusive, very unrealistic in her expectations. She is very excited about getting disability. She believes that she can buy "anything she wants" with disability money. She still complains of some pain in the area in her side but admits that this is getting better. She takes her medications as prescribed. She denies any side effects and actually likes the Depakote very much. Sleep and appetite are good. There is excellent participation in programming.  Estimated length of stay:7 days  New goal(s):  Review of initial/current patient goals per problem list:   1.  Goal(s): Patient will participate in aftercare plan * Met: No * Target date: at discharge * As evidenced by: Patient will participate within aftercare plan AEB aftercare provider and housing plan at discharge being identified. 5.  Goal (s): Patient will demonstrate decreased symptoms of Mania. * Met: No  *  Target date: at discharge As evidenced by: Patient will not endorse signs of mania or be deemed stable for discharge by  MD.  Attendees: Patient:  Charlene [name] 7/11/20173:08 PM  Family:   7/11/20173:08 PM  Physician:  Orson Slick 7/11/20173:08 PM  Nursing: Anderson Malta RN   7/11/20173:08 PM  Case Manager:   7/11/20173:08 PM  Counselor:  Dossie Arbour, LCSW 7/11/20173:08 PM  Other:  Everitt Amber, [place] 7/11/20173:08 PM  Other:   7/11/20173:08 PM  Other:   7/11/20173:08 PM  Other:  7/11/20173:08 PM  Other:  7/11/20173:08 PM  Other:  7/11/20173:08 PM  Other:  7/11/20173:08 PM  Other:  7/11/20173:08 PM  Other:  7/11/20173:08 PM  Other:   7/11/20173:08 PM   Scribe for Treatment Team:   August Saucer, 12/25/2015, 3:08 PM, MSW, LCSW

## 2015-12-25 NOTE — B[facility] Group Notes (Signed)
Goals Group  Date/Time: 12/25/15 9am  Type of Therapy and Topic: Group Therapy: Goals Group: SMART Goals   Pt was called, but did not attend   Alphonse Guild. Aadvika Konen, LCSWA, LCAS

## 2015-12-25 NOTE — [facility] Group Notes (Signed)
[place] LCSW Group Therapy   12/25/2015  3PM  Type of Therapy: Group Therapy   Participation Level: Did Not Attend. Patient invited to participate but declined.    Alphonse Guild. Huda Petrey, MSW, LCSWA, LCAS

## 2015-12-25 NOTE — Progress Notes (Signed)
Recreation Therapy Notes  Date: 07.11.17 Time: 9:30 am Location: Craft Room  Group Topic: Self-expression  Goal Area(s) Addresses:  Patient will identify one color per emotion listed on wheel. Patient will verbalize one emotion experienced during session. Patient will be educated on other forms of self-expression.  Behavioral Response: Attentive, Interactive, Off Topic  Intervention: Emotion Wheel  Activity: Patients were given an Emotion Wheel worksheet and instructed to pick a color for each emotion listed on the wheel.  Education: LRT educated patients on different forms of self-expression.  Education Outcome: Acknowledges education/In group clarification offered  Clinical Observations/Feedback: Patient completed activity by picking a color for each emotion. Patient contributed to group discussion by stating what emotions she felt while she was coloring. Patient talked about wanting the start a Bible study and at the end of group, she was praising God.  Leonette Monarch, LRT/CTRS 12/25/2015 10:19 AM

## 2015-12-25 NOTE — Progress Notes (Signed)
[facility] MD Progress Note  12/25/2015 9:51 AM Charlene [name]  MRN:  [id number]  Subjective:  Charlene [name] continues to improve. She is no longer agitated and her thoughts are better organized she however still remains hypomanic, euphoric, hyperactive, intrusive, very unrealistic in her expectations. She is very excited about getting disability. She believes that she can buy "anything she wants" with disability money. She still complains of some pain in the area in her side but admits that this is getting better. She takes her medications as prescribed. She denies any side effects and actually likes the Depakote very much. Sleep and appetite are good. There is excellent participation in programming.  Principal Problem: Bipolar I disorder, most recent episode manic, severe with psychotic features ([place]) Diagnosis:   Patient Active Problem List   Diagnosis Date Noted  . Acute psychosis associated with endocrine, metabolic, or cerebrovascular disorder [F06.8, F01.50] 12/21/2015  . Cocaine use disorder, moderate, dependence ([place]) [F14.20] 12/21/2015  . Bipolar I disorder, most recent episode manic, severe with psychotic features ([place]) [F31.2] 12/21/2015  . OCD (obsessive compulsive disorder) [F42.9] 12/21/2015  . PTSD (post-traumatic stress disorder) [F43.10] 12/21/2015  . Abnormal cells of cervix [N87.9] 07/30/2015  . Encounter for general adult medical examination without abnormal findings [Z00.00] 07/28/2015  . Primary cancer of right lower lobe of lung ([place]) [C34.31] 07/12/2015  . At risk for noncompliance [Z87.898] 06/29/2015  . Spasm [R25.2] 06/26/2015  . Essential (primary) hypertension [I10] 06/21/2015  . Lung mass [R91.8] 06/21/2015  . Candida infection of mouth [B37.0] 06/21/2015  . 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 Episodic mood disorder ([place]) [F39] 10/14/2014  . Chronic obstructive pulmonary disease ([place]) [J44.9] 10/06/2014  . Pulmonary emphysema ([place]) [J43.9]  10/06/2014  . LBP (low back pain) [M54.5] 09/27/2014  . Tobacco use disorder [F17.200] 09/27/2014   Total Time spent with patient: 20 minutes  Past Psychiatric History: Bipolar disorder.  Past Medical History:  Past Medical History  Diagnosis Date  . Primary cancer of right lower lobe of lung ([place]) 07/12/2015    Small cell undifferentiated carcinoma of lung.  Diagnosis at [facility] by fine-needle aspiration of lymph node (January, 2017)  . Bipolar 2 disorder ([place])   . Borderline schizophrenia   . Falls infrequently     Past Surgical History  Procedure Laterality Date  . Abdominal hysterectomy    . Appendectomy    . Stomach surgery    . Nasal sinus surgery      x2  . Peripheral vascular catheterization N/A 07/30/2015    Procedure: Glori Luis Cath Insertion;  Surgeon: Algernon Huxley, MD;  Location: [place] [facility];  Service: Cardiovascular;  Laterality: N/A;   Family History: History reviewed. No pertinent family history. Family Psychiatric  History: Daughter with bipolar. Social History:  History  Alcohol Use No     History  Drug Use  . Yes  . Special: Cocaine    Comment: States due to cocaine in mouthwash    Social History   Social History  . Marital Status: Divorced    Spouse Name: N/A  . Number of Children: N/A  . Years of Education: N/A   Social History Main Topics  . Smoking status: Former Smoker -- 0.50 packs/day for 30 years    Quit date: 06/17/2015  . Smokeless tobacco: None  . Alcohol Use: No  . Drug Use: Yes    Special: Cocaine     Comment: States due to cocaine in mouthwash  .  Sexual Activity: Not Asked   Other Topics Concern  . None   Social History Narrative   Additional Social History:                         Sleep: Fair  Appetite:  Fair  Current Medications: Current Facility-Administered Medications  Medication Dose Route Frequency Provider Last Rate Last Dose  . albuterol (PROVENTIL HFA;VENTOLIN HFA) 108 (90 Base)  MCG/ACT inhaler 2 puff  2 puff Inhalation Q4H PRN Clovis Fredrickson, MD   2 puff at 12/23/15 1825  . cyclobenzaprine (FLEXERIL) tablet 10 mg  10 mg Oral TID Clovis Fredrickson, MD   10 mg at 12/25/15 0911  . diclofenac (VOLTAREN) EC tablet 75 mg  75 mg Oral BID Clovis Fredrickson, MD   75 mg at 12/25/15 0911  . divalproex (DEPAKOTE) DR tablet 1,000 mg  1,000 mg Oral Q12H Araly Kaas B Erricka Falkner, MD   1,000 mg at 12/25/15 0911  . doxycycline (VIBRA-TABS) tablet 100 mg  100 mg Oral Q12H Cliffton Spradley B Brighton Pilley, MD   100 mg at 12/25/15 0911  . fluconazole (DIFLUCAN) tablet 100 mg  100 mg Oral Daily Mayari Matus B Matilynn Dacey, MD   100 mg at 12/25/15 0911  . fluticasone (FLONASE) 50 MCG/ACT nasal spray 2 spray  2 spray Each Nare Daily Clovis Fredrickson, MD   2 spray at 12/25/15 0910  . fluvoxaMINE (LUVOX) tablet 150 mg  150 mg Oral QHS Clovis Fredrickson, MD   150 mg at 12/24/15 2152  . guaiFENesin-dextromethorphan (ROBITUSSIN DM) 100-10 MG/5ML syrup 5 mL  5 mL Oral Q4H PRN Clovis Fredrickson, MD   5 mL at 12/24/15 0856  . loratadine (CLARITIN) tablet 10 mg  10 mg Oral Daily Clovis Fredrickson, MD   10 mg at 12/25/15 0911  . magic mouthwash w/lidocaine  10 mL Oral QID PRN Akeira Lahm B Kayli Beal, MD      . mupirocin ointment (BACTROBAN) 2 %   Topical QID Becky Colan B Ger Nicks, MD      . nystatin (MYCOSTATIN) 100000 UNIT/ML suspension 500,000 Units  5 mL Oral QID Clovis Fredrickson, MD   500,000 Units at 12/25/15 0912  . oxyCODONE-acetaminophen (PERCOCET) 7.5-325 MG per tablet 1 tablet  1 tablet Oral Q6H PRN Clovis Fredrickson, MD   1 tablet at 12/25/15 0917  . risperiDONE (RISPERDAL) tablet 2 mg  2 mg Oral BID Clovis Fredrickson, MD   2 mg at 12/25/15 0912  . sucralfate (CARAFATE) tablet 1 g  1 g Oral TID WC & HS Zacharia Sowles B Leili Eskenazi, MD   1 g at 12/25/15 0737  . temazepam (RESTORIL) capsule 15 mg  15 mg Oral QHS Clovis Fredrickson, MD   15 mg at 12/24/15 2152    Lab Results:  Results for  orders placed or performed during the hospital encounter of 12/21/15 (from the past 48 hour(s))  Valproic acid level     Status: Abnormal   Collection Time: 12/24/15  6:44 AM  Result Value Ref Range   Valproic Acid Lvl 32 (L) 50.0 - 100.0 ug/mL    Blood Alcohol level:  No results found for: [facility]  Metabolic Disorder Labs: Lab Results  Component Value Date   HGBA1C 6.2* 12/22/2015   Lab Results  Component Value Date   PROLACTIN 32.4* 12/22/2015   Lab Results  Component Value Date   CHOL 179 12/22/2015   TRIG 246* 12/22/2015   HDL 52 12/22/2015  CHOLHDL 3.4 12/22/2015   VLDL 49* 12/22/2015   LDLCALC 78 12/22/2015    Physical Findings: AIMS: Facial and Oral Movements Muscles of Facial Expression: None, normal Lips and Perioral Area: None, normal Jaw: None, normal Tongue: None, normal,Extremity Movements Upper (arms, wrists, hands, fingers): None, normal Lower (legs, knees, ankles, toes): None, normal, Trunk Movements Neck, shoulders, hips: None, normal, Overall Severity Severity of abnormal movements (highest score from questions above): None, normal Incapacitation due to abnormal movements: None, normal Patient's awareness of abnormal movements (rate only patient's report): No Awareness, Dental Status Current problems with teeth and/or dentures?: No Does patient usually wear dentures?: No  CIWA:    COWS:     Musculoskeletal: Strength & Muscle Tone: decreased Gait & Station: unsteady Patient leans: N/A  Psychiatric Specialty Exam: Physical Exam  Nursing note and vitals reviewed.   Review of Systems  Neurological: Positive for weakness.  Psychiatric/Behavioral: Positive for depression and suicidal ideas. The patient has insomnia.   All other systems reviewed and are negative.   Blood pressure 130/80, pulse 114, temperature 98.2 F (36.8 C), temperature source Oral, resp. rate 20, height '5\' 8"'$  (1.727 m), weight 67.586 kg (149 lb), SpO2 97 %.Body mass index is  22.66 kg/(m^2).  General Appearance: Casual  Eye Contact:  Good  Speech:  Clear and Coherent  Volume:  Normal  Mood:  Euphoric  Affect:  Labile  Thought Process:  Goal Directed  Orientation:  Full (Time, Place, and Person)  Thought Content:  Illogical  Suicidal Thoughts:  No  Homicidal Thoughts:  No  Memory:  Immediate;   Fair Recent;   Fair Remote;   Fair  Judgement:  Impaired  Insight:  Shallow  Psychomotor Activity:  Increased  Concentration:  Concentration: Fair and Attention Span: Fair  Recall:  [employer] of Knowledge:  Fair  Language:  Fair  Akathisia:  No  Handed:  Right  AIMS (if indicated):     Assets:  Communication Skills Desire for Improvement Housing Resilience Social Support  ADL's:  Intact  Cognition:  WNL  Sleep:  Number of Hours: 6     Treatment Plan Summary: Daily contact with patient to assess and evaluate symptoms and progress in treatment and Medication management   Charlene [name] is a 55 year old female with a history of bipolar disorder and stage IV cancer admitted in the manic psychotic episode while off medications.   1. Mood and psychosis. We will increase Depakote to 1000 mg bid for mood stabilization and continue Risperdal for psychosis. VPA 33.  2. Anxiety of OCD or PTSD type. We will increase Luvox to 150 mg for OCD. We will consider Minipress for nightmares and flashbacks. She has been asking for Ativan. I will not prescribe benzodiazepines as she is on narcotic painkillers.  3. COPD. We will continue inhalers.  4. Smoking. Nicotine patch is available.  5. Insomnia. She dis not respond to Trazodone. We start Restoril.   6. Chronic pain. She has been taking Voltaren and narcotic painkillers. We increased Flexeril to 10 mg three times daily. She reports neck pain from recent fall.   7. Thrush. She is on Diflucan and nystatin.  8. GERD. She is on Carafate.  9. Cancer treatment. We will ask oncology for consultation. The patient reports  that she just completed dexamethasone taper and that she is cured.  10. Metabolic syndrome screening. Lipid panel, TSH, hemoglobin A1c are normal. Prolactin 32.   11. Cellulitis. We started Vibramycin and bactroban.  12. Deconditioning.  We will ask for PT assessment.   13. Disposition. She will bee discharged to home. She will folow up with [facility].  Orson Slick, MD 12/25/2015, 9:51 AM

## 2015-12-25 NOTE — Progress Notes (Addendum)
D: Patient stated slept good last night .Stated appetite is good and energy level  Is normal. Stated concentration is good . Stated no  Depression scale , hopeless or anxiety .( low 0-10 high) Denies suicidal  homicidal ideations  .  No auditory hallucinations  No pain concerns . Appropriate ADL'S. Interacting with peers and staff.  Patient spoke of her  Being able to get Disability, voice of all the things she is going to do with the back money. Patient Religious preoccupied. Patient using walker to move about unit  A: Encourage patient participation with unit programming . Instruction  Given on  Medication , verbalize understanding. R: Voice no other concerns. Staff continue to monitor

## 2015-12-25 NOTE — [facility] Group Notes (Signed)
[place] LCSW Group Therapy   12/25/2015  3PM  Type of Therapy: Group Therapy   Participation Level: Did Not Attend. Patient invited to participate but declined.    Alphonse Guild. Sonja Manseau, MSW, LCSWA, LCAS

## 2015-12-25 NOTE — Plan of Care (Signed)
Problem: Coping: Goal: Ability to demonstrate self-control will improve Outcome: Progressing Patient demonstrating self control.

## 2015-12-25 NOTE — Plan of Care (Signed)
Problem: Coping: Goal: Ability to identify and develop effective coping behavior will improve Outcome: Progressing Patient attending  Unit programing  Working on coping skills.

## 2015-12-25 NOTE — Progress Notes (Signed)
D: Pt denies SI/HI/AVH. Pt is pleasant and cooperative, affect is flat and sad. Patient's thoughts are organized and logical. Patient is less religious and less tearful. Pt appears less anxious and she is interacting with peers and staff appropriately.  A: Pt was offered support and encouragement. Pt was given scheduled medications. Pt was encouraged to attend groups. Q 15 minute checks were done for safety.  R:Pt attends groups and interacts well with peers and staff. Pt is compliant with medication. Pt has no complaints.Pt receptive to treatment and safety maintained on unit.

## 2015-12-26 NOTE — Plan of Care (Signed)
Problem: Coping: Goal: Ability to identify and develop effective coping behavior will improve Outcome: Progressing Receiving listing of coping  Skills , encourage to work on them .

## 2015-12-26 NOTE — Progress Notes (Signed)
Recreation Therapy Notes  Date: 07.12.17 Time: 9:30 am Location: Craft Room  Group Topic: Self-esteem  Goal Area(s) Addresses:  Patient will write at least one positive trait about self. Patient will verbalize benefit of having healthy self-esteem.  Behavioral Response: Attentive, Left early  Intervention: I Am  Activity: Patients were given a worksheet with the letter I on it and instructed to write as many positive traits about themselves inside the letter.  Education: LRT educated patients on ways they can increase their self-esteem.  Education Outcome: Patient left before LRT educated group.   Clinical Observations/Feedback: Patient wrote positive traits and colored her worksheet. Patient left group at approximately 10:00 am stating she was hurting and did not return to group.  Leonette Monarch, LRT/CTRS 12/26/2015 10:11 AM

## 2015-12-26 NOTE — [facility] Group Notes (Signed)
[place] LCSW Group Therapy   12/26/2015  1pm  Type of Therapy: Group Therapy   Participation Level: Did Not Attend. Patient invited to participate but declined.    Alphonse Guild. Danean Marner, MSW, LCSWA, LCAS

## 2015-12-26 NOTE — [facility] Group Notes (Signed)
[place] Group Notes:  (Nursing/MHT/Case Management/Adjunct)  Date:  12/26/2015  Time:  5:50 PM  Type of Therapy:  Psychoeducational Skills  Participation Level:  Minimal  Participation Quality:  Appropriate and Came late to group.  Affect:  Appropriate  Cognitive:  Appropriate  Insight:  Appropriate  Engagement in Group:  Engaged  Modes of Intervention:  Discussion, Education and Support  Summary of Progress/Problems:  Adela Lank Camarie Mctigue 12/26/2015, 5:50 PM

## 2015-12-26 NOTE — Progress Notes (Signed)
[facility] MD Progress Note  12/26/2015 6:47 PM Charlene [name]  MRN:  [id number]  Subjective:  Charlene [name] remains deluional, grandiose, unrealistic. She has very little insight into her problems, believes that she is cured from cancer and that SS disability money has been pouring. She has been trading clothes with her peers. She has already spend it in her mind. She complains of knee, neck and back pain is spite of narcotic pain killers prescribed. She tolerates medications well. Good group participation.  Principal Problem: Bipolar I disorder, most recent episode manic, severe with psychotic features ([place]) Diagnosis:   Patient Active Problem List   Diagnosis Date Noted  . Acute psychosis associated with endocrine, metabolic, or cerebrovascular disorder [F06.8, F01.50] 12/21/2015  . Cocaine use disorder, moderate, dependence ([place]) [F14.20] 12/21/2015  . Bipolar I disorder, most recent episode manic, severe with psychotic features ([place]) [F31.2] 12/21/2015  . OCD (obsessive compulsive disorder) [F42.9] 12/21/2015  . PTSD (post-traumatic stress disorder) [F43.10] 12/21/2015  . Abnormal cells of cervix [N87.9] 07/30/2015  . Encounter for general adult medical examination without abnormal findings [Z00.00] 07/28/2015  . Primary cancer of right lower lobe of lung ([place]) [C34.31] 07/12/2015  . At risk for noncompliance [Z87.898] 06/29/2015  . Spasm [R25.2] 06/26/2015  . Essential (primary) hypertension [I10] 06/21/2015  . Lung mass [R91.8] 06/21/2015  . Candida infection of mouth [B37.0] 06/21/2015  . Small cell carcinoma of lung ([place]) [C34.90] 06/21/2015  . Episodic mood disorder ([place]) [F39] 10/14/2014  . Chronic obstructive pulmonary disease ([place]) [J44.9] 10/06/2014  . Pulmonary emphysema ([place]) [J43.9] 10/06/2014  . LBP (low back pain) [M54.5] 09/27/2014  . Tobacco use disorder [F17.200] 09/27/2014   Total Time spent with patient: 20 minutes  Past Psychiatric History: bipolar disorder,  disseminate cancer.  Past Medical History:  Past Medical History  Diagnosis Date  . Primary cancer of right lower lobe of lung ([place]) 07/12/2015    Small cell undifferentiated carcinoma of lung.  Diagnosis at [facility] by fine-needle aspiration of lymph node (January, 2017)  . Bipolar 2 disorder ([place])   . Borderline schizophrenia   . Falls infrequently     Past Surgical History  Procedure Laterality Date  . Abdominal hysterectomy    . Appendectomy    . Stomach surgery    . Nasal sinus surgery      x2  . Peripheral vascular catheterization N/A 07/30/2015    Procedure: Glori Luis Cath Insertion;  Surgeon: Algernon Huxley, MD;  Location: [facility];  Service: Cardiovascular;  Laterality: N/A;   Family History: History reviewed. No pertinent family history. Family Psychiatric  History: bipolar disorder. Social History:  History  Alcohol Use No     History  Drug Use  . Yes  . Special: Cocaine    Comment: States due to cocaine in mouthwash    Social History   Social History  . Marital Status: Divorced    Spouse Name: N/A  . Number of Children: N/A  . Years of Education: N/A   Social History Main Topics  . Smoking status: Former Smoker -- 0.50 packs/day for 30 years    Quit date: 06/17/2015  . Smokeless tobacco: None  . Alcohol Use: No  . Drug Use: Yes    Special: Cocaine     Comment: States due to cocaine in mouthwash  . Sexual Activity: Not Asked   Other Topics Concern  . None   Social History Narrative   Additional Social History:  Sleep: Fair  Appetite:  Fair  Current Medications: Current Facility-Administered Medications  Medication Dose Route Frequency Provider Last Rate Last Dose  . albuterol (PROVENTIL HFA;VENTOLIN HFA) 108 (90 Base) MCG/ACT inhaler 2 puff  2 puff Inhalation Q4H PRN Clovis Fredrickson, MD   2 puff at 12/23/15 1825  . cyclobenzaprine (FLEXERIL) tablet 10 mg  10 mg Oral TID Clovis Fredrickson,  MD   10 mg at 12/26/15 1525  . diclofenac (VOLTAREN) EC tablet 75 mg  75 mg Oral BID Clovis Fredrickson, MD   75 mg at 12/26/15 0907  . divalproex (DEPAKOTE) DR tablet 1,000 mg  1,000 mg Oral Q12H Jaser Fullen B Yasheka Fossett, MD   1,000 mg at 12/26/15 0907  . doxycycline (VIBRA-TABS) tablet 100 mg  100 mg Oral Q12H Manali Mcelmurry B Daisi Kentner, MD   100 mg at 12/26/15 0908  . fluconazole (DIFLUCAN) tablet 100 mg  100 mg Oral Daily Braxen Dobek B Sinead Hockman, MD   100 mg at 12/26/15 0908  . fluticasone (FLONASE) 50 MCG/ACT nasal spray 2 spray  2 spray Each Nare Daily Clovis Fredrickson, MD   2 spray at 12/26/15 0907  . fluvoxaMINE (LUVOX) tablet 150 mg  150 mg Oral QHS Clovis Fredrickson, MD   150 mg at 12/25/15 2148  . guaiFENesin-dextromethorphan (ROBITUSSIN DM) 100-10 MG/5ML syrup 5 mL  5 mL Oral Q4H PRN Clovis Fredrickson, MD   5 mL at 12/24/15 0856  . loratadine (CLARITIN) tablet 10 mg  10 mg Oral Daily Clovis Fredrickson, MD   10 mg at 12/26/15 0908  . magic mouthwash w/lidocaine  10 mL Oral QID PRN Zea Kostka B Simuel Stebner, MD      . mupirocin ointment (BACTROBAN) 2 %   Topical QID Alizandra Loh B Olie Scaffidi, MD      . nystatin (MYCOSTATIN) 100000 UNIT/ML suspension 500,000 Units  5 mL Oral QID Clovis Fredrickson, MD   500,000 Units at 12/26/15 1824  . oxyCODONE-acetaminophen (PERCOCET) 7.5-325 MG per tablet 1 tablet  1 tablet Oral Q6H PRN Clovis Fredrickson, MD   1 tablet at 12/26/15 0747  . risperiDONE (RISPERDAL) tablet 2 mg  2 mg Oral BID Clovis Fredrickson, MD   2 mg at 12/26/15 0908  . sucralfate (CARAFATE) tablet 1 g  1 g Oral TID WC & HS Derisha Funderburke B Virlan Kempker, MD   1 g at 12/26/15 1653  . temazepam (RESTORIL) capsule 15 mg  15 mg Oral QHS Clovis Fredrickson, MD   15 mg at 12/25/15 2149    Lab Results: No results found for this or any previous visit (from the past 48 hour(s)).  Blood Alcohol level:  No results found for: [facility]  Metabolic Disorder Labs: Lab Results  Component Value Date    HGBA1C 6.2* 12/22/2015   Lab Results  Component Value Date   PROLACTIN 32.4* 12/22/2015   Lab Results  Component Value Date   CHOL 179 12/22/2015   TRIG 246* 12/22/2015   HDL 52 12/22/2015   CHOLHDL 3.4 12/22/2015   VLDL 49* 12/22/2015   LDLCALC 78 12/22/2015    Physical Findings: AIMS: Facial and Oral Movements Muscles of Facial Expression: None, normal Lips and Perioral Area: None, normal Jaw: None, normal Tongue: None, normal,Extremity Movements Upper (arms, wrists, hands, fingers): None, normal Lower (legs, knees, ankles, toes): None, normal, Trunk Movements Neck, shoulders, hips: None, normal, Overall Severity Severity of abnormal movements (highest score from questions above): None, normal Incapacitation due to abnormal movements: None, normal Patient's awareness  of abnormal movements (rate only patient's report): No Awareness, Dental Status Current problems with teeth and/or dentures?: No Does patient usually wear dentures?: No  CIWA:    COWS:     Musculoskeletal: Strength & Muscle Tone: within normal limits Gait & Station: normal Patient leans: N/A  Psychiatric Specialty Exam: Physical Exam  Nursing note and vitals reviewed.   Review of Systems  Musculoskeletal: Positive for back pain, joint pain and neck pain.  Psychiatric/Behavioral: Positive for hallucinations.  All other systems reviewed and are negative.   Blood pressure 124/76, pulse 86, temperature 98.6 F (37 C), temperature source Oral, resp. rate 20, height '5\' 8"'$  (1.727 m), weight 67.586 kg (149 lb), SpO2 99 %.Body mass index is 22.66 kg/(m^2).  General Appearance: Casual  Eye Contact:  Good  Speech:  Clear and Coherent  Volume:  Normal  Mood:  Euphoric  Affect:  Congruent  Thought Process:  Disorganized  Orientation:  Full (Time, Place, and Person)  Thought Content:  Delusions and Paranoid Ideation  Suicidal Thoughts:  No  Homicidal Thoughts:  No  Memory:  Immediate;   Fair Recent;    Fair Remote;   Fair  Judgement:  Poor  Insight:  Lacking  Psychomotor Activity:  Increased  Concentration:  Concentration: Fair and Attention Span: Fair  Recall:  [employer] of Knowledge:  Fair  Language:  Fair  Akathisia:  No  Handed:  Right  AIMS (if indicated):     Assets:  Communication Skills Desire for Improvement Housing Resilience Social Support  ADL's:  Intact  Cognition:  WNL  Sleep:  Number of Hours: 5     Treatment Plan Summary: Daily contact with patient to assess and evaluate symptoms and progress in treatment and Medication management  Charlene [name] is a 55 year old female with a history of bipolar disorder and stage IV cancer admitted in the manic psychotic episode while off medications.   1. Mood and psychosis. We will increase Depakote to 1000 mg bid for mood stabilization and continue Risperdal for psychosis. VPA 33. VPA level and ammonia level in am.  2. Anxiety of OCD or PTSD type. We will increase Luvox to 150 mg for OCD. We will consider Minipress for nightmares and flashbacks. She has been asking for Ativan. I will not prescribe benzodiazepines as she is on narcotic painkillers.  3. COPD. We will continue inhalers.  4. Smoking. Nicotine patch is available.  5. Insomnia. She dis not respond to Trazodone. We start Restoril.   6. Chronic pain. She has been taking Voltaren and narcotic painkillers. We increased Flexeril to 10 mg three times daily. She reports neck pain from recent fall.   7. Thrush. She is on Diflucan and nystatin.  8. GERD. She is on Carafate.  9. Cancer treatment. Spoke with her providers at the cancer center. Following discharge, she will continue brain irradiation at the cancer center. Unfortunately, contrary to what the patient believes, she is NOT cured of cancer.   10. Metabolic syndrome screening. Lipid panel, TSH, hemoglobin A1c are normal. Prolactin 32.   11. Cellulitis. We started Vibramycin and bactroban.  12.  Deconditioning. PT consult is greatly appreciated.    13. Disposition. She will bee discharged to home. She will folow up with [facility].  Orson Slick, MD 12/26/2015, 6:47 PM

## 2015-12-26 NOTE — Progress Notes (Signed)
Physical Therapy Treatment Patient Details Name: Charlene [name] MRN: [id number] DOB: 06/02/1961 Today's Date: 12/26/2015    History of Present Illness Pt is a pleasant 55 y/o female admitted under IVC to behavioral health unit. Patient was admitted with agitated, combative behavior with disorganized and psychotic thinking. She has a history of cancer and recently finished radiation tx.     PT Comments    Pt is making slow progress towards goals. Pt motivated to continue ambulation, however demonstrates decreased safety awareness. Pt with decreased balance, requiring use of RW for all mobility at this time. Would progress further treatment including balance activities as well as stretching in B knees to improve extension and overall posture. Continue to work on Engineer, production.  Follow Up Recommendations  Outpatient PT     Equipment Recommendations  Rolling walker with 5" wheels    Recommendations for Other Services       Precautions / Restrictions Precautions Precautions: Fall Restrictions Weight Bearing Restrictions: No    Mobility  Bed Mobility               General bed mobility comments: not tested as pt received in hallway ambulating with RW  Transfers Overall transfer level: Needs assistance Equipment used: Rolling walker (2 wheeled) Transfers: Sit to/from Stand Sit to Stand: Supervision         General transfer comment: safe technique performed with crouched posture noted once in standing. RW used for assistance  Ambulation/Gait Ambulation/Gait assistance: Modified independent (Device/Increase time) Ambulation Distance (Feet): 300 Feet Assistive device: Rolling walker (2 wheeled) Gait Pattern/deviations: Shuffle     General Gait Details: Pt ambulates using flexed posture with B knees in constant flexion. Pt with shuffling gait pattern, almost chasing her balance. RW used at all times. Pt reports previously ambulating without assistance, therefore  tired and required 2 standing rest breaks.    Stairs            Wheelchair Mobility    Modified Rankin (Stroke Patients Only)       Balance                                    Cognition Arousal/Alertness: Awake/alert Behavior During Therapy: WFL for tasks assessed/performed Overall Cognitive Status: History of cognitive impairments - at baseline                      Exercises Other Exercises Other Exercises: Seated ther-ex performed on B LE including LAQ x 10 reps with cga. Cues given for correct technique.    General Comments        Pertinent Vitals/Pain Pain Assessment: No/denies pain    Home Living                      Prior Function            PT Goals (current goals can now be found in the care plan section) Acute Rehab PT Goals Patient Stated Goal: To return home  PT Goal Formulation: With patient Time For Goal Achievement: 01/07/16 Potential to Achieve Goals: Good Progress towards PT goals: Progressing toward goals    Frequency  Min 2X/week    PT Plan Current plan remains appropriate    Co-evaluation             End of Session Equipment Utilized During Treatment: Gait belt Activity Tolerance: Patient tolerated treatment well  Patient left:  (Pt taken to group therapy)     Time: 3295-1884 PT Time Calculation (min) (ACUTE ONLY): 18 min  Charges:  $Gait Training: 8-22 mins                    G Codes:      Gwenda Heiner 01/16/2016, 4:31 PM  Greggory Stallion, PT, DPT [phone]

## 2015-12-26 NOTE — Progress Notes (Signed)
[facility] MD Progress Note  12/26/2015 12:17 PM TAMETHA BANNING  MRN:  [id number]  Subjective:  Ms. Deshazer is still delusional believing that she is to cancer, too happy for the situation, feeling wonderful. She accepts medications and tolerates them well. Good group participation. I spoke with cancer center nurse coordinator. Following discharge, the patient will continue radiation therapy. Commentary to patient's belief, she is unfortunately not cured of cancer.  Principal Problem: Bipolar I disorder, most recent episode manic, severe with psychotic features ([place]) Diagnosis:   Patient Active Problem List   Diagnosis Date Noted  . Acute psychosis associated with endocrine, metabolic, or cerebrovascular disorder [F06.8, F01.50] 12/21/2015  . Cocaine use disorder, moderate, dependence ([place]) [F14.20] 12/21/2015  . Bipolar I disorder, most recent episode manic, severe with psychotic features ([place]) [F31.2] 12/21/2015  . OCD (obsessive compulsive disorder) [F42.9] 12/21/2015  . PTSD (post-traumatic stress disorder) [F43.10] 12/21/2015  . Abnormal cells of cervix [N87.9] 07/30/2015  . Encounter for general adult medical examination without abnormal findings [Z00.00] 07/28/2015  . Primary cancer of right lower lobe of lung ([place]) [C34.31] 07/12/2015  . At risk for noncompliance [Z87.898] 06/29/2015  . Spasm [R25.2] 06/26/2015  . Essential (primary) hypertension [I10] 06/21/2015  . Lung mass [R91.8] 06/21/2015  . Candida infection of mouth [B37.0] 06/21/2015  . Small cell carcinoma of lung ([place]) [C34.90] 06/21/2015  . Episodic mood disorder ([place]) [F39] 10/14/2014  . Chronic obstructive pulmonary disease ([place]) [J44.9] 10/06/2014  . Pulmonary emphysema ([place]) [J43.9] 10/06/2014  . LBP (low back pain) [M54.5] 09/27/2014  . Tobacco use disorder [F17.200] 09/27/2014   Total Time spent with patient: 20 minutes  Past Psychiatric History:  Alcohol   Past Medical History:  Past Medical History   Diagnosis Date  . Primary cancer of right lower lobe of lung ([place]) 07/12/2015    Small cell undifferentiated carcinoma of lung.  Diagnosis at [facility] by fine-needle aspiration of lymph node (January, 2017)  . Bipolar 2 disorder ([place])   . Borderline schizophrenia   . Falls infrequently     Past Surgical History  Procedure Laterality Date  . Abdominal hysterectomy    . Appendectomy    . Stomach surgery    . Nasal sinus surgery      x2  . Peripheral vascular catheterization N/A 07/30/2015    Procedure: Glori Luis Cath Insertion;  Surgeon: Algernon Huxley, MD;  Location: [facility];  Service: Cardiovascular;  Laterality: N/A;   Family History: History reviewed. No pertinent family history. Family Psychiatric  History: See H&P. Social History:  History  Alcohol Use No     History  Drug Use  . Yes  . Special: Cocaine    Comment: States due to cocaine in mouthwash    Social History   Social History  . Marital Status: Divorced    Spouse Name: N/A  . Number of Children: N/A  . Years of Education: N/A   Social History Main Topics  . Smoking status: Former Smoker -- 0.50 packs/day for 30 years    Quit date: 06/17/2015  . Smokeless tobacco: None  . Alcohol Use: No  . Drug Use: Yes    Special: Cocaine     Comment: States due to cocaine in mouthwash  . Sexual Activity: Not Asked   Other Topics Concern  . None   Social History Narrative   Additional Social History:  Sleep: Fair  Appetite:  Fair  Current Medications: Current Facility-Administered Medications  Medication Dose Route Frequency Provider Last Rate Last Dose  . albuterol (PROVENTIL HFA;VENTOLIN HFA) 108 (90 Base) MCG/ACT inhaler 2 puff  2 puff Inhalation Q4H PRN Clovis Fredrickson, MD   2 puff at 12/23/15 1825  . cyclobenzaprine (FLEXERIL) tablet 10 mg  10 mg Oral TID Clovis Fredrickson, MD   10 mg at 12/26/15 0907  . diclofenac (VOLTAREN) EC tablet 75 mg  75 mg  Oral BID Clovis Fredrickson, MD   75 mg at 12/26/15 0907  . divalproex (DEPAKOTE) DR tablet 1,000 mg  1,000 mg Oral Q12H Elienai Gailey B Gracynn Rajewski, MD   1,000 mg at 12/26/15 0907  . doxycycline (VIBRA-TABS) tablet 100 mg  100 mg Oral Q12H Polina Burmaster B Asyia Hornung, MD   100 mg at 12/26/15 0908  . fluconazole (DIFLUCAN) tablet 100 mg  100 mg Oral Daily Tiphani Mells B Jamieson Lisa, MD   100 mg at 12/26/15 0908  . fluticasone (FLONASE) 50 MCG/ACT nasal spray 2 spray  2 spray Each Nare Daily Clovis Fredrickson, MD   2 spray at 12/26/15 0907  . fluvoxaMINE (LUVOX) tablet 150 mg  150 mg Oral QHS Clovis Fredrickson, MD   150 mg at 12/25/15 2148  . guaiFENesin-dextromethorphan (ROBITUSSIN DM) 100-10 MG/5ML syrup 5 mL  5 mL Oral Q4H PRN Clovis Fredrickson, MD   5 mL at 12/24/15 0856  . loratadine (CLARITIN) tablet 10 mg  10 mg Oral Daily Clovis Fredrickson, MD   10 mg at 12/26/15 0908  . magic mouthwash w/lidocaine  10 mL Oral QID PRN Krisalyn Yankowski B Wasim Hurlbut, MD      . mupirocin ointment (BACTROBAN) 2 %   Topical QID Clovis Fredrickson, MD   1 application at 43/32/95 0910  . nystatin (MYCOSTATIN) 100000 UNIT/ML suspension 500,000 Units  5 mL Oral QID Clovis Fredrickson, MD   500,000 Units at 12/26/15 0906  . oxyCODONE-acetaminophen (PERCOCET) 7.5-325 MG per tablet 1 tablet  1 tablet Oral Q6H PRN Clovis Fredrickson, MD   1 tablet at 12/26/15 0747  . risperiDONE (RISPERDAL) tablet 2 mg  2 mg Oral BID Clovis Fredrickson, MD   2 mg at 12/26/15 0908  . sucralfate (CARAFATE) tablet 1 g  1 g Oral TID WC & HS Gavan Nordby B Lynia Landry, MD   1 g at 12/26/15 1151  . temazepam (RESTORIL) capsule 15 mg  15 mg Oral QHS Clovis Fredrickson, MD   15 mg at 12/25/15 2149    Lab Results: No results found for this or any previous visit (from the past 48 hour(s)).  Blood Alcohol level:  No results found for: [facility]  Metabolic Disorder Labs: Lab Results  Component Value Date   HGBA1C 6.2* 12/22/2015   Lab Results  Component  Value Date   PROLACTIN 32.4* 12/22/2015   Lab Results  Component Value Date   CHOL 179 12/22/2015   TRIG 246* 12/22/2015   HDL 52 12/22/2015   CHOLHDL 3.4 12/22/2015   VLDL 49* 12/22/2015   LDLCALC 78 12/22/2015    Physical Findings: AIMS: Facial and Oral Movements Muscles of Facial Expression: None, normal Lips and Perioral Area: None, normal Jaw: None, normal Tongue: None, normal,Extremity Movements Upper (arms, wrists, hands, fingers): None, normal Lower (legs, knees, ankles, toes): None, normal, Trunk Movements Neck, shoulders, hips: None, normal, Overall Severity Severity of abnormal movements (highest score from questions above): None, normal Incapacitation due to abnormal movements: None,  normal Patient's awareness of abnormal movements (rate only patient's report): No Awareness, Dental Status Current problems with teeth and/or dentures?: No Does patient usually wear dentures?: No  CIWA:    COWS:     Musculoskeletal: Strength & Muscle Tone: decreased Gait & Station: unsteady Patient leans: N/A  Psychiatric Specialty Exam: Physical Exam  Nursing note and vitals reviewed.   Review of Systems  Musculoskeletal: Positive for back pain.  Psychiatric/Behavioral: Positive for hallucinations.  All other systems reviewed and are negative.   Blood pressure 120/73, pulse 118, temperature 98.6 F (37 C), temperature source Oral, resp. rate 20, height '5\' 8"'$  (1.727 m), weight 67.586 kg (149 lb), SpO2 99 %.Body mass index is 22.66 kg/(m^2).  General Appearance: Casual  Eye Contact:  Good  Speech:  Clear and Coherent  Volume:  Normal  Mood:  Euphoric  Affect:  Congruent  Thought Process:  Goal Directed  Orientation:  Full (Time, Place, and Person)  Thought Content:  Illogical, Delusions and Paranoid Ideation  Suicidal Thoughts:  No  Homicidal Thoughts:  No  Memory:  Immediate;   Fair Recent;   Fair Remote;   Fair  Judgement:  Impaired  Insight:  Shallow   Psychomotor Activity:  Normal  Concentration:  Concentration: Fair and Attention Span: Fair  Recall:  [employer] of Knowledge:  Fair  Language:  Fair  Akathisia:  No  Handed:  Right  AIMS (if indicated):     Assets:  Communication Skills Desire for Improvement Housing Resilience Social Support  ADL's:  Intact  Cognition:  WNL  Sleep:  Number of Hours: 5     Treatment Plan Summary: Daily contact with patient to assess and evaluate symptoms and progress in treatment and Medication management   Ms. Schwarzkopf is a 55 year old female with a history of bipolar disorder and stage IV cancer admitted in the manic psychotic episode while off medications.   1. Mood and psychosis. We will increase Depakote to 1000 mg bid for mood stabilization and continue Risperdal for psychosis. VPA 33.  2. Anxiety of OCD or PTSD type. We will increase Luvox to 150 mg for OCD. We will consider Minipress for nightmares and flashbacks. She has been asking for Ativan. I will not prescribe benzodiazepines as she is on narcotic painkillers.  3. COPD. We will continue inhalers.  4. Smoking. Nicotine patch is available.  5. Insomnia. She dis not respond to Trazodone. We start Restoril.   6. Chronic pain. She has been taking Voltaren and narcotic painkillers. We increased Flexeril to 10 mg three times daily. She reports neck pain from recent fall.   7. Thrush. She is on Diflucan and nystatin.  8. GERD. She is on Carafate.  9. Cancer treatment. We will ask oncology for consultation. The patient reports that she just completed dexamethasone taper and that she is cured.  10. Metabolic syndrome screening. Lipid panel, TSH, hemoglobin A1c are normal. Prolactin 32.   11. Cellulitis. We started Vibramycin and bactroban.  12. Deconditioning. We will ask for PT assessment.   13. Disposition. She will bee discharged to home. She will folow up with [facility].  Orson Slick, MD 12/26/2015, 12:17 PM

## 2015-12-26 NOTE — Progress Notes (Signed)
D: Patient stated slept good last night .Stated appetite is good and energy level low . Stated concentration is poor .  Patient continue to walk with walker . Physical Therapy  Walked patient  Today.  Periods of confusion and forgetfulness  Remains to be religious  preoccupied This shift . Aware of discharge  On Friday A: Encourage patient participation with unit programming . Instruction  Given on  Medication , verbalize understanding. R: Voice no other concerns. Staff continue to monitor

## 2015-12-26 NOTE — Plan of Care (Signed)
Problem: Coping: Goal: Ability to verbalize frustrations and anger appropriately will improve Outcome: Progressing Receiving listing of coping  Skills , encourage to work on them .

## 2015-12-26 NOTE — Progress Notes (Signed)
Pt remains disorganized and confused. Pt requires frequent redirections. tol po bedtime medications well.  C/o back pain. Percocet 1 tab po given at 2340 PRN. Attends group. Denies SI/HI. Slept 5 hours.

## 2015-12-27 NOTE — Progress Notes (Signed)
c/o back pain and states she needs her inhaler. Auditory wheezing noted. Percocet 1 tab po and albuterol inhaler given, respectively PRN. No s/s of resp distress noted. Will continue to monitor for safety/

## 2015-12-27 NOTE — Progress Notes (Signed)
Recreation Therapy Notes  Date: 07.13.17 Time: 9:30 am Location: Craft Room  Group Topic: Leisure Education  Goal Area(s) Addresses:  Patient will identify activities for each letter of the alphabet. Patient will verbalize ability to use leisure as a Technical sales engineer.  Behavioral Response: Attentive, Left early  Intervention: Leisure Alphabet  Activity: Patients were given a Leisure Air traffic controller and instructed to identify a leisure activity for each letter of the alphabet.   Education: LRT educated patients on what they need to participate in leisure.  Education Outcome: Patient left before LRT educated group.  Clinical Observations/Feedback: Patient worked on Radio producer. Patient started to falling asleep in group. Patient left group at approximately 9:48 am. Patient did not return to group.  Leonette Monarch, LRT/CTRS 12/27/2015 10:17 AM

## 2015-12-27 NOTE — Plan of Care (Signed)
Problem: Coping: Goal: Ability to identify and develop effective coping behavior will improve Outcome: Progressing Patient remains disorganized and confused. Requires frequent redirection. Med compliant. No PRNs given. S/e and adverse reactions discussed. Questions encouraged. Attends group. Interacting with peers and staff. No behavior issues noted. Denies SI/HI. q 15 min checks maintained for safety.

## 2015-12-27 NOTE — BHH Group Notes (Signed)
[place] LCSW Group Therapy   12/27/2015 1 pm   Type of Therapy: Group Therapy   Participation Level: Active   Participation Quality: Attentive, Sharing and Supportive   Affect: Appropriate   Cognitive: Alert and Oriented   Insight: Developing/Improving and Engaged   Engagement in Therapy: Developing/Improving and Engaged   Modes of Intervention: Clarification, Confrontation, Discussion, Education, Exploration, Limit-setting, Orientation, Problem-solving, Rapport Building, Art therapist, Socialization and Support   Summary of Progress/Problems: The topic for group was balance in life. Today's group focused on defining balance in one's own words, identifying things that can knock one off balance, and exploring healthy ways to maintain balance in life. Group members were asked to provide an example of a time when they felt off balance, describe how they handled that situation, and process healthier ways to regain balance in the future. Group members were asked to share the most important tool for maintaining balance that they learned while at [facility] and how they plan to apply this method after discharge. Pt presented as fatigued and slurred her wors and spent group time attempting to whisper to another group member.  Pt did present as more pleasant and calm than in previous sessions, but her speech and thoughts were disorganized.

## 2015-12-27 NOTE — BHH Group Notes (Signed)
[place] Group Notes:  (Nursing/MHT/Case Management/Adjunct)  Date:  12/27/2015  Time:  3:17 AM  Type of Therapy:  Psychoeducational Skills  Participation Level:  Minimal  Participation Quality:  Attentive  Affect:  Appropriate  Cognitive:  Alert  Insight:  Appropriate  Engagement in Group:  None  Modes of Intervention:  Discussion, Socialization and Support  Summary of Progress/Problems: Patient did not participate in the group discussion.  Charlene [name] 12/27/2015, 3:17 AM

## 2015-12-27 NOTE — Plan of Care (Signed)
Problem: Education: Goal: Knowledge of [facility] Education information/materials will improve Outcome: Progressing Educated on medication  regiment

## 2015-12-27 NOTE — BHH Group Notes (Signed)
Goals Group Date/Time: 12/27/15 9am Type of Therapy and Topic: Group Therapy: Goals Group: SMART Goals   Participation Level: Moderate  Description of Group:    The purpose of a daily goals group is to assist and guide patients in setting recovery/wellness-related goals. The objective is to set goals as they relate to the crisis in which they were admitted. Patients will be using SMART goal modalities to set measurable goals. Characteristics of realistic goals will be discussed and patients will be assisted in setting and processing how one will reach their goal. Facilitator will also assist patients in applying interventions and coping skills learned in psycho-education groups to the SMART goal and process how one will achieve defined goal.   Therapeutic Goals:         -Patients will develop and document one goal related to or their crisis in which brought them into treatment.  -Patients will be guided by LCSW using SMART goal setting modality in how to set a measurable, attainable, realistic and time sensitive goal.  -Patients will process barriers in reaching goal.  -Patients will process interventions in how to overcome and successful in reaching goal.   Patient's Goal: Pt did not share at extremes during group. Pt was polite and cooperative with the CSW and other group members and focused and attentive to the topics discussed and the sharing of others.  Pt's affect was appropriate and the pt presented as pleasant and calm.  Pt seems to have made great improvement during group, as evidenced by increased sharing, sharing at length when prompted and pt presents a happy and increasingly energetic, as compared to previous sessions.      Therapeutic Modalities:  Motivational Interviewing  Art gallery manager  SMART goals setting   Alphonse Guild. French Kendra, LCSWA, LCAS

## 2015-12-27 NOTE — BHH Group Notes (Signed)
[place] Group Notes:  (Nursing/MHT/Case Management/Adjunct)  Date:  12/27/2015  Time:  3:53 PM  Type of Therapy:  Psychoeducational Skills  Participation Level:  Minimal  Participation Quality:  Appropriate and Inattentive  Affect:  Appropriate, Flat and Lethargic  Cognitive:  Appropriate and Lacking  Insight:  Limited  Engagement in Group:  Poor  Modes of Intervention:  Discussion, Education and Support  Summary of Progress/Problems:  Charlene [name] [facility] 12/27/2015, 3:53 PM

## 2015-12-27 NOTE — Progress Notes (Signed)
D: Patient stated slept good last night .Stated appetite is good and energy level  Is normal. Stated concentration is good . Stated on Depression scale 2 , hopeless 3and anxiety0 .( low 0-10 high) Denies suicidal  homicidal ideations  .  No auditory hallucinations  No pain concerns . Appropriate ADL'S. Interacting with peers and staff.  Patient confused and disoriented at times during shift. Continue to  Use  Walker  A: Encourage patient participation with unit programming . Instruction  Given on  Medication , verbalize understanding. R: Voice no other concerns. Staff continue to monitor

## 2015-12-28 LAB — VALPROIC ACID LEVEL: Valproic Acid Lvl: 76 ug/mL (ref 50.0–100.0)

## 2015-12-28 LAB — AMMONIA: AMMONIA: 17 umol/L (ref 9–35)

## 2015-12-28 MED ORDER — FLUVOXAMINE MALEATE 50 MG PO TABS
200.0000 mg | ORAL_TABLET | Freq: Every day | ORAL | Status: DC
  Administered 2015-12-28 – 2015-12-30 (×3): 200 mg via ORAL
  Filled 2015-12-28 (×3): qty 4

## 2015-12-28 MED ORDER — TUBERCULIN PPD 5 UNIT/0.1ML ID SOLN
5.0000 [IU] | Freq: Once | INTRADERMAL | Status: AC
  Administered 2015-12-28: 5 [IU] via INTRADERMAL
  Filled 2015-12-28: qty 0.1

## 2015-12-28 NOTE — Progress Notes (Signed)
[facility] MD Progress Note  12/28/2015 1:18 PM Charlene [name]  MRN:  [id number]  Subjective:  Charlene [name] is a 55 year old unfortunate female with a history of severe bipolar disorder end-stage four small lung carcinoma. Charlene [name] was admitted in a manic episode over unclear etiology. Charlene [name] has brain metastases therefore treated with radiation up until admission. Charlene [name] had a course of dexamethasone just prior to admission. Charlene [name] has also been off her bipolar medications during Charlene course of counseling or treatment at least since January 2017. Charlene [name] initially presented manic and euphoric. Everything was great. Charlene [name] believes Charlene [name] was cured of cancer. Charlene [name] believes that her Medicaid and disability came through. For Charlene past 2 days Charlene [name] appears depressed. Her situation were changed dramatically as Charlene [name] may not return home with her family as there are more safe people there. Apparently both bipolar disorder and lack cancer runs in Charlene family. Charlene [name] will be placed in assisted living facility and restarted P[facility]AR application. Following discharge Charlene [name] will continue brain radiation therapy for cancer. Charlene [name] doesn't have insurance or disability at present. It is possible that Charlene [name] started to recognize her true situation.  Today Charlene [name] met with treatment team. Charlene [name] is uncharacteristically subdued. Charlene [name] talks very little. Charlene [name] moves with some difficulty using a walker while before her gait was much better and energetic. Charlene [name] works with PT. Charlene [name] accepts medications and tolerates them well. Her Depakote level was therapeutic today at 76 with normal ammonia. Charlene [name] is out in [facility] community and participates well in programming. Charlene [name] continues to complain of shoulder and neck pain even though Charlene [name] is receiving narcotic painkillers. 2 days ago Charlene [name] fell and hit her knee. Charlene [name] does not complain of knee pain.  Principal Problem: Bipolar I disorder, most recent episode manic, severe with psychotic features ([place]) Diagnosis:   [name] Active Problem  List   Diagnosis Date Noted  . Acute psychosis associated with endocrine, metabolic, or cerebrovascular disorder [F06.8, F01.50] 12/21/2015  . Cocaine use disorder, moderate, dependence ([place]) [F14.20] 12/21/2015  . Bipolar I disorder, most recent episode manic, severe with psychotic features ([place]) [F31.2] 12/21/2015  . OCD (obsessive compulsive disorder) [F42.9] 12/21/2015  . PTSD (post-traumatic stress disorder) [F43.10] 12/21/2015  . Abnormal cells of cervix [N87.9] 07/30/2015  . Encounter for general adult medical examination without abnormal findings [Z00.00] 07/28/2015  . Primary cancer of right lower lobe of lung ([place]) [C34.31] 07/12/2015  . At risk for noncompliance [Z87.898] 06/29/2015  . Spasm [R25.2] 06/26/2015  . Essential (primary) hypertension [I10] 06/21/2015  . Lung mass [R91.8] 06/21/2015  . Candida infection of mouth [B37.0] 06/21/2015  . 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 Episodic mood disorder ([place]) [F39] 10/14/2014  . Chronic obstructive pulmonary disease ([place]) [J44.9] 10/06/2014  . Pulmonary emphysema ([place]) [J43.9] 10/06/2014  . LBP (low back pain) [M54.5] 09/27/2014  . Tobacco use disorder [F17.200] 09/27/2014   Total Time spent with [name]: 20 minutes  Past Psychiatric History: Bipolar disorder. Past Medical History:  Past Medical History  Diagnosis Date  . Primary cancer of right lower lobe of lung ([place]) 07/12/2015    Small cell undifferentiated carcinoma of lung.  Diagnosis at [facility] by fine-needle aspiration of lymph node (January, 2017)  . Bipolar 2 disorder ([place])   . Borderline schizophrenia   . Falls infrequently     Past Surgical History  Procedure Laterality Date  . Abdominal hysterectomy    . Appendectomy    . Stomach surgery    .  Nasal sinus surgery      x2  . Peripheral vascular catheterization N/A 07/30/2015    Procedure: Glori Luis Cath Insertion;  Surgeon: Algernon Huxley, MD;  Location: [facility];   Service: Cardiovascular;  Laterality: N/A;   Family History: History reviewed. No pertinent family history. Family Psychiatric  History:Bipolar disorder in her mother and brother. Social History:  History  Alcohol Use No     History  Drug Use  . Yes  . Special: Cocaine    Comment: States due to cocaine in mouthwash    Social History   Social History  . Marital Status: Divorced    Spouse Name: N/A  . Number of Children: N/A  . Years of Education: N/A   Social History Main Topics  . Smoking status: Former Smoker -- 0.50 packs/day for 30 years    Quit date: 06/17/2015  . Smokeless tobacco: None  . Alcohol Use: No  . Drug Use: Yes    Special: Cocaine     Comment: States due to cocaine in mouthwash  . Sexual Activity: Not Asked   Other Topics Concern  . None   Social History Narrative   Additional Social History:                         Sleep: Fair  Appetite:  Fair  Current Medications: Current Facility-Administered Medications  Medication Dose Route Frequency Provider Last Rate Last Dose  . albuterol (PROVENTIL HFA;VENTOLIN HFA) 108 (90 Base) MCG/ACT inhaler 2 puff  2 puff Inhalation Q4H PRN Clovis Fredrickson, MD   2 puff at 12/27/15 2056  . cyclobenzaprine (FLEXERIL) tablet 10 mg  10 mg Oral TID Clovis Fredrickson, MD   10 mg at 12/28/15 2703  . diclofenac (VOLTAREN) EC tablet 75 mg  75 mg Oral BID Clovis Fredrickson, MD   75 mg at 12/28/15 0820  . divalproex (DEPAKOTE) DR tablet 1,000 mg  1,000 mg Oral Q12H Sandy Blouch B Shayon Trompeter, MD   1,000 mg at 12/28/15 0821  . doxycycline (VIBRA-TABS) tablet 100 mg  100 mg Oral Q12H Calee Nugent B Delvon Chipps, MD   100 mg at 12/28/15 5009  . fluconazole (DIFLUCAN) tablet 100 mg  100 mg Oral Daily Clovis Fredrickson, MD   100 mg at 12/28/15 3818  . fluticasone (FLONASE) 50 MCG/ACT nasal spray 2 spray  2 spray Each Nare Daily Clovis Fredrickson, MD   2 spray at 12/28/15 0825  . fluvoxaMINE (LUVOX) tablet 200 mg  200  mg Oral QHS Aiyanna Awtrey B Berna Gitto, MD      . guaiFENesin-dextromethorphan (ROBITUSSIN DM) 100-10 MG/5ML syrup 5 mL  5 mL Oral Q4H PRN Clovis Fredrickson, MD   5 mL at 12/27/15 2157  . loratadine (CLARITIN) tablet 10 mg  10 mg Oral Daily Clovis Fredrickson, MD   10 mg at 12/28/15 0821  . magic mouthwash w/lidocaine  10 mL Oral QID PRN Ashdon Gillson B Valeriano Bain, MD      . mupirocin ointment (BACTROBAN) 2 %   Topical QID Clovis Fredrickson, MD   1 application at 29/93/71 0827  . nystatin (MYCOSTATIN) 100000 UNIT/ML suspension 500,000 Units  5 mL Oral QID Clovis Fredrickson, MD   500,000 Units at 12/28/15 0913  . oxyCODONE-acetaminophen (PERCOCET) 7.5-325 MG per tablet 1 tablet  1 tablet Oral Q6H PRN Clovis Fredrickson, MD   1 tablet at 12/28/15 1243  . risperiDONE (RISPERDAL) tablet 2 mg  2 mg  Oral BID Clovis Fredrickson, MD   2 mg at 12/28/15 0821  . sucralfate (CARAFATE) tablet 1 g  1 g Oral TID WC & HS Elfie Costanza B Thayne Cindric, MD   1 g at 12/28/15 1220  . temazepam (RESTORIL) capsule 15 mg  15 mg Oral QHS Clovis Fredrickson, MD   15 mg at 12/27/15 2152  . tuberculin injection 5 Units  5 Units Intradermal Once Clovis Fredrickson, MD        Lab Results:  Results for orders placed or performed during Charlene hospital encounter of 12/21/15 (from Charlene past 48 hour(s))  Valproic acid level     Status: None   Collection Time: 12/28/15  6:25 AM  Result Value Ref Range   Valproic Acid Lvl 76 50.0 - 100.0 ug/mL  Ammonia     Status: None   Collection Time: 12/28/15  6:25 AM  Result Value Ref Range   Ammonia 17 9 - 35 umol/L    Blood Alcohol level:  No results found for: [facility]  Metabolic Disorder Labs: Lab Results  Component Value Date   HGBA1C 6.2* 12/22/2015   Lab Results  Component Value Date   PROLACTIN 32.4* 12/22/2015   Lab Results  Component Value Date   CHOL 179 12/22/2015   TRIG 246* 12/22/2015   HDL 52 12/22/2015   CHOLHDL 3.4 12/22/2015   VLDL 49* 12/22/2015   LDLCALC 78  12/22/2015    Physical Findings: AIMS: Facial and Oral Movements Muscles of Facial Expression: None, normal Lips and Perioral Area: None, normal Jaw: None, normal Tongue: None, normal,Extremity Movements Upper (arms, wrists, hands, fingers): None, normal Lower (legs, knees, ankles, toes): None, normal, Trunk Movements Neck, shoulders, hips: None, normal, Overall Severity Severity of abnormal movements (highest score from questions above): None, normal Incapacitation due to abnormal movements: None, normal [name]'s awareness of abnormal movements (rate only [name]'s report): No Awareness, Dental Status Current problems with teeth and/or dentures?: No Does [name] usually wear dentures?: No  CIWA:    COWS:     Musculoskeletal: Strength & Muscle Tone: within normal limits Gait & Station: normal [name] leans: N/A  Psychiatric Specialty Exam: Physical Exam  Nursing note and vitals reviewed.   Review of Systems  Psychiatric/Behavioral: Positive for depression.  All other systems reviewed and are negative.   Blood pressure 108/71, pulse 113, temperature 98 F (36.7 C), temperature source Oral, resp. rate 18, height '5\' 8"'$  (1.727 m), weight 67.586 kg (149 lb), SpO2 100 %.Body mass index is 22.66 kg/(m^2).  General Appearance: Casual  Eye Contact:  Good  Speech:  Clear and Coherent  Volume:  Decreased  Mood:  Depressed  Affect:  Blunt  Thought Process:  Goal Directed  Orientation:  Full (Time, Place, and Person)  Thought Content:  Illogical, Delusions and Paranoid Ideation  Suicidal Thoughts:  No  Homicidal Thoughts:  No  Memory:  Immediate;   Fair Recent;   Fair Remote;   Fair  Judgement:  Impaired  Insight:  Lacking  Psychomotor Activity:  Decreased  Concentration:  Concentration: Fair and Attention Span: Fair  Recall:  [employer] of Knowledge:  Fair  Language:  Fair  Akathisia:  No  Handed:  Right  AIMS (if indicated):     Assets:  Communication Skills Desire  for Improvement Resilience Social Support  ADL's:  Intact  Cognition:  WNL  Sleep:  Number of Hours: 6.45     Treatment Plan Summary: Daily contact with [name] to assess and evaluate  symptoms and progress in treatment and Medication management   Ms. Parkison is a 55 year old female with a history of bipolar disorder and stage IV cancer admitted in Charlene manic psychotic episode while off medications.   1. Mood and psychosis. We started Depakote for mood stabilization and Risperdal for psychosis. VPA 54, ammonia 17 on 7/14/207.  2. Anxiety of OCD or PTSD type. We will increase Luvox to 200 mg for OCD and depression.  3. COPD. We will continue inhalers.  4. Smoking. Nicotine patch is available.  5. Insomnia. Charlene [name] did not respond to Trazodone. We started Restoril.   6. Chronic pain. Charlene [name] has been taking Voltaren and narcotic painkillers. We increased Flexeril to 10 mg three times daily. Charlene [name] reports neck and shoulder painl.   7. Thrush. Charlene [name] is on Diflucan and nystatin.  8. GERD. Charlene [name] is on Carafate.  9. Cancer treatment. Sp[facility] with [facility] providers [facility]. Following discharge, Charlene [name] will continue brain radiation therapy at [facility]. Unfortunately, contrary to what Charlene [name] believes, Charlene [name] is NOT cured of cancer.   10. Metabolic syndrome screening. Lipid panel, TSH, hemoglobin A1c are normal. Prolactin 32.   11. Cellulitis. We started Vibramycin and bactroban.  12. Deconditioning. PT consult is greatly appreciated.   13. Disposition. Charlene [name] will bee discharged to assisted living facility as Charlene family is unable to care for her. PPD placed. PASSAR application submitted. Charlene [name] will folow up with [facility].  Orson Slick, MD 12/28/2015, 1:18 PM

## 2015-12-28 NOTE — Progress Notes (Signed)
Physical Therapy Treatment Patient Details Name: Charlene [name] MRN: [id number] DOB: 25-Jul-1960 Today's Date: 12/28/2015    History of Present Illness Pt is a pleasant 55 y/o female admitted under IVC to behavioral health unit. Patient was admitted with agitated, combative behavior with disorganized and psychotic thinking. She has a history of cancer and recently finished radiation tx.     PT Comments    Pt was very lethargic and required extra cueing to perform functional mobility during today's session . Per RN lethargy may be secondary to slowing coming off of medications. Pt was oriented to herself and location. Pt required +2 moderate assistance for all bed mobility, transfers, and ambulation. Pt demonstrated flexed trunk posture and flexed knees throughout session. Pt was given cues to correct but was unable to retain cues constantly drifting back to flexed posture. Pt seemed to respond best to visual cues. When pt was asked to look into mirror and extend neck and back, she was able to maintain upright posture for longer (with B UE support on sink). Pt was taken to restroom, exhibited poor sitting balance trying to rest chin on walker. Pt required constant tactile and verbal stimulation to stay awake. Although pt seemed to do more poorly with session, PT attributes performance to mental changes from decreasing medication. Pt continues to recommend to outpatient PT to improve functional mobility, strength, balance, and safety awareness to decrease fall risk. Discussion with CSW regarding discharge plans.  Follow Up Recommendations  Outpatient PT     Equipment Recommendations  Rolling walker with 5" wheels    Recommendations for Other Services       Precautions / Restrictions Precautions Precautions: Fall Restrictions Weight Bearing Restrictions: No    Mobility  Bed Mobility Overal bed mobility: Needs Assistance Bed Mobility: Supine to Sit     Supine to sit: Mod assist;+2 for  physical assistance     General bed mobility comments: Pt required +2 moderate assistance for bed mobility. Verbal and tactile cueing provded to initiate movement pattern and B UE used to pull herself up to sit. Upon sitting on EOB pt demonstrated a flexed posture appearing as she was going to fall forward. Tactile and verbal cueing provided to sit up straight but pt did not seem to retain as she would immediately drift back into flexed position.  Transfers Overall transfer level: Needs assistance Equipment used: Rolling walker (2 wheeled) Transfers: Sit to/from Stand Sit to Stand: Mod assist;+2 physical assistance         General transfer comment: Pt required +2 mod assist for sit/stand transfer. Pt required constant verbal and tactile cueing to push through RW to assist and to stand up straight to decrease fall risk. Pt was also transfered to chair at end of session and did not demonstrate safe technique. Verbal cueing was provided to keep RW in in front of her and to use it to assist with transfer, but pt did not follow the cue.   Ambulation/Gait Ambulation/Gait assistance: Mod assist;+2 physical assistance Ambulation Distance (Feet): 20 Feet Assistive device: Rolling walker (2 wheeled) Gait Pattern/deviations: Step-to pattern;Decreased step length - right;Decreased step length - left;Decreased stride length;Shuffle;Antalgic;Trunk flexed;Narrow base of support   Gait velocity interpretation: Below normal speed for age/gender General Gait Details: Pt continues to ambulate with flexed knees. +2 moderate assistance required due to pt's flexed over posture. Pt was cued to stand up straight and to extend B LE but pt did not retain consistenltly drifted back into flexion.   Stairs  Wheelchair Mobility    Modified Rankin (Stroke Patients Only)       Balance Overall balance assessment: Needs assistance Sitting-balance support: Bilateral upper extremity supported;Feet  supported Sitting balance-Leahy Scale: Poor Sitting balance - Comments: Pt exhibited poor balance in today's session possibly due to lethargy secondary to weening off of medications. Pt demonstrated anterior lean, flexed over posture and was able to follow cues to correct posture. When on toilet, pt relied heavily on RW in front of her to maintain sitting balance.   Standing balance support: Bilateral upper extremity supported Standing balance-Leahy Scale: Poor Standing balance comment: Poor standing balance noted today. +2 moderate assist required to keep pt upright. Pt relied heavily on UE support on RW to maintain balance.                    Cognition Arousal/Alertness: Lethargic Behavior During Therapy: WFL for tasks assessed/performed Overall Cognitive Status: History of cognitive impairments - at baseline                      Exercises Other Exercises Other Exercises: Seated ther-ex: B LAQs and marches x 12 reps with moderate assist to prevent pt from falling forward. Tactile cues given to initiate movement. B LE passive nerve glides x 5 reps. Other Exercises: Pt was taken to bathroom to void. Pt continued to struggle with balance and required +2 mod assist for transfers and sitting balance.    General Comments        Pertinent Vitals/Pain Pain Assessment: Faces Pain Score: 4  Faces Pain Scale: Hurts little more Pain Location: L shoulder, neck, and back. Pain Intervention(s): Limited activity within patient's tolerance;Monitored during session    Home Living                      Prior Function            PT Goals (current goals can now be found in the care plan section) Acute Rehab PT Goals Patient Stated Goal: To return home  PT Goal Formulation: With patient Time For Goal Achievement: 01/07/16 Potential to Achieve Goals: Good Progress towards PT goals: Progressing toward goals    Frequency  Min 2X/week    PT Plan Current plan remains  appropriate    Co-evaluation             End of Session Equipment Utilized During Treatment: Gait belt Activity Tolerance: Patient limited by fatigue;Patient limited by lethargy;Patient limited by pain Patient left: in bed     Time: 1036-1100 PT Time Calculation (min) (ACUTE ONLY): 24 min  Charges:  $Gait Training: 8-22 mins $Therapeutic Exercise: 8-22 mins                    G Codes:      Georgina Pillion 01/15/2016, 11:55 AM Georgina Pillion, SPT [phone]

## 2015-12-28 NOTE — Plan of Care (Signed)
Problem: Education: Goal: Ability to state activities that reduce stress will improve Outcome: Progressing Patient seen coloring. She said its one of her coping skills.

## 2015-12-28 NOTE — Progress Notes (Signed)
Recreation Therapy Notes  Date: 07.14.17 Time: 9:30 am Location: Craft Room  Group Topic: Coping Skills  Goal Area(s) Addresses:  Patient will participate in healthy coping skill. Patient will verbalize one emotion experienced during group.  Behavioral Response: Did not attend  Intervention: Coloring  Activity: Patients were given coloring sheets and instructed to color while thinking about the emotions they were feeling and what their mind was focused on.  Education: LRT educated patients on healthy coping skills.  Education Outcome: Patient did not attend group.  Clinical Observations/Feedback: Patient did not attend group.  Leonette Monarch, LRT/CTRS 12/28/2015 10:24 AM

## 2015-12-28 NOTE — BHH Group Notes (Signed)
[facility] Group Notes:  (Nursing/MHT/Case Management/Adjunct)  Date:  12/28/2015  Time:  1:39 AM  Type of Therapy:  Group Therapy  Participation Level:  Did Not Attend    Summary of Progress/Problems:  Charlene [name] 12/28/2015, 1:39 AM

## 2015-12-28 NOTE — BHH Group Notes (Signed)
[facility] Group Notes:  (Nursing/MHT/Case Management/Adjunct)  Date:  12/28/2015  Time:  3:33 PM  Type of Therapy:  Movement Therapy  Participation Level:  None  Participation Quality:  Drowsy  Affect:  Flat  Cognitive:  Lacking  Insight:  Lacking  Engagement in Group:  Lacking  Modes of Intervention:  Socialization  Summary of Progress/Problems:  Charlene [name] De'Chelle Gabreil [place] 12/28/2015, 3:33 PM

## 2015-12-28 NOTE — Progress Notes (Signed)
Patient noted to be with a flat, sad affect. She did not attend groups and rested in room. Minimal interaction with peers and was seen coloring briefly.  Patient denied SI/HI/AVH and contracted for safety. She continues to utilize a walker to get around. Pain management continued as patient complained of back and leg pain. No Falls reported thus far. Will continue to monitor patient frequently on 15 min checks to ensure safety.

## 2015-12-28 NOTE — Tx Team (Signed)
Interdisciplinary Treatment Plan Update (Adult)  Date:  12/28/2015 Time Reviewed:  2:41 PM  Progress in Treatment: Attending groups: Yes. Participating in groups:  Yes. Taking medication as prescribed:  Yes. Tolerating medication:  Yes. Family/Significant othe contact made:  Yes, individual(s) contacted:  father  Patient understands diagnosis:  No. and As evidenced by:  Limited insight  Discussing patient identified problems/goals with staff:  Yes. Medical problems stabilized or resolved:  Yes. Denies suicidal/homicidal ideation: Yes. Issues/concerns per patient self-inventory:  Yes. Other:  New problem(s) identified: No, Describe:  NA  Discharge Plan or Barriers: Pt plans to return home and follow up with outpatient.    Reason for Continuation of Hospitalization: Mania Medication stabilization  Comments: Charlene [name] is a 55 year old unfortunate female with a history of severe bipolar disorder end-stage four small lung carcinoma. She was admitted in a manic episode over unclear etiology. She has brain metastases therefore treated with radiation up until admission. She had a course of dexamethasone just prior to admission. She has also been off her bipolar medications during the course of counseling or treatment at least since January 2017. She initially presented manic and euphoric. Everything was great. She believes she was cured of cancer. She believes that her Medicaid and disability came through. For the past 2 days the patient appears depressed. Her situation were changed dramatically as she may not return home with her family as there are more safe people there. Apparently both bipolar disorder and lack cancer runs in the family. She will be placed in assisted living facility and restarted [facility] application. Following discharge she will continue brain radiation therapy for cancer. She doesn't have insurance or disability at present. It is possible that the patient started to recognize  her true situation.  Estimated length of stay: 7 days   New goal(s): NA  Review of initial/current patient goals per problem list:   1.  Goal(s): Patient will participate in aftercare plan * Met: Yes * Target date: at discharge * As evidenced by: Patient will participate within aftercare plan AEB aftercare provider and housing plan at discharge being identified  2.  Goal (s): Patient will demonstrate decreased symptoms of mania. * Met: No  *  Target date: at discharge * As evidenced by: Patient will not endorse signs of mania or be deemed stable for discharge by MD.   Attendees: Patient:  Charlene [name] 7/14/20172:41 PM  Family:   7/14/20172:41 PM  Physician:   Dr. Bary Leriche  7/14/20172:41 PM  Nursing:   Elige Radon, RN  7/14/20172:41 PM  Case Manager:   7/14/20172:41 PM  Counselor:   7/14/20172:41 PM  Other:  Wray Kearns, [place] 7/14/20172:41 PM  Other:  Everitt Amber, [place]  7/14/20172:41 PM  Other:   7/14/20172:41 PM  Other:  7/14/20172:41 PM  Other:  7/14/20172:41 PM  Other:  7/14/20172:41 PM  Other:  7/14/20172:41 PM  Other:  7/14/20172:41 PM  Other:  7/14/20172:41 PM  Other:   7/14/20172:41 PM   Scribe for Treatment Team:   Wray Kearns MSW, [place] , 12/28/2015, 2:41 PM

## 2015-12-29 NOTE — Progress Notes (Signed)
Patient noted to be sleepy on unit today. Writer re enforced fall protocol. Supervised ambulation with walker after lunch and toilet patient and then patient to recliner to rest. Patient aware to use call bell. Patient sleeping. Staff noted patient on floor after trying to change her pants. Patient did not use call bell. VS monitored and recorded. MD notified and to review meds. Mother phones and notified and to visit. Patient denies discomfort. Patient in recliner to rest with alarm present.

## 2015-12-29 NOTE — NC FL2 (Signed)
[place] LEVEL OF CARE SCREENING TOOL     IDENTIFICATION  Patient Name: Charlene [name] Birthdate: June 05, 1961 Sex: female Admission Date (Current Location): 12/21/2015  [facility] and [place] Number:  [place]  Medicaid pending Facility and Address:  [facility], [address], [place], [place] [address]      Provider Number: [id number]  Attending Physician Name and Address:  Clovis Fredrickson, MD  Relative Name and Phone Number:  Arelia Sneddon (father) [phone]    Current Level of Care: Hospital Recommended Level of Care: [place] Prior Approval Number:    Date Approved/Denied:   PASRR Number:    Discharge Plan: Other (Comment) (ALF)    Current Diagnoses: Patient Active Problem List   Diagnosis Date Noted  . Acute psychosis associated with endocrine, metabolic, or cerebrovascular disorder 12/21/2015  . Cocaine use disorder, moderate, dependence ([place]) 12/21/2015  . Bipolar I disorder, most recent episode manic, severe with psychotic features ([place]) 12/21/2015  . OCD (obsessive compulsive disorder) 12/21/2015  . PTSD (post-traumatic stress disorder) 12/21/2015  . Abnormal cells of cervix 07/30/2015  . Encounter for general adult medical examination without abnormal findings 07/28/2015  . Primary cancer of right lower lobe of lung ([place]) 07/12/2015  . At risk for noncompliance 06/29/2015  . Spasm 06/26/2015  . Essential (primary) hypertension 06/21/2015  . Lung mass 06/21/2015  . Candida infection of mouth 06/21/2015  . Small cell carcinoma of lung ([place]) 06/21/2015  . Episodic mood disorder ([place]) 10/14/2014  . Chronic obstructive pulmonary disease ([place]) 10/06/2014  . Pulmonary emphysema ([place]) 10/06/2014  . LBP (low back pain) 09/27/2014  . Tobacco use disorder 09/27/2014    Orientation RESPIRATION BLADDER Height & Weight     Self, Time, Place  Normal Continent Weight: 149 lb (67.586 kg) Height:  '5\' 8"'$  (172.7  cm)  BEHAVIORAL SYMPTOMS/MOOD NEUROLOGICAL BOWEL NUTRITION STATUS   (None )  (None ) Continent  (Normal )  AMBULATORY STATUS COMMUNICATION OF NEEDS Skin   Limited Assist Verbally Skin abrasions                       Personal Care Assistance Level of Assistance  Bathing, Feeding, Dressing, Total care Bathing Assistance: Limited assistance Feeding assistance: Limited assistance Dressing Assistance: Limited assistance Total Care Assistance: Limited assistance   Functional Limitations Info  Sight, Hearing, Speech Sight Info: Adequate Hearing Info: Adequate Speech Info: Adequate    SPECIAL CARE FACTORS FREQUENCY  PT (By licensed PT) (Pallative Treatment )     PT Frequency: 2x weekly              Contractures Contractures Info: Not present    Additional Factors Info  Psychotropic     Psychotropic Info: See medications          Current Medications (12/29/2015):  This is the current hospital active medication list Current Facility-Administered Medications  Medication Dose Route Frequency Provider Last Rate Last Dose  . albuterol (PROVENTIL HFA;VENTOLIN HFA) 108 (90 Base) MCG/ACT inhaler 2 puff  2 puff Inhalation Q4H PRN Clovis Fredrickson, MD   2 puff at 12/27/15 2056  . cyclobenzaprine (FLEXERIL) tablet 10 mg  10 mg Oral TID Clovis Fredrickson, MD   10 mg at 12/29/15 0915  . diclofenac (VOLTAREN) EC tablet 75 mg  75 mg Oral BID Clovis Fredrickson, MD   75 mg at 12/29/15 0915  . divalproex (DEPAKOTE) DR tablet 1,000 mg  1,000 mg Oral Q12H Jolanta  Vevelyn Francois, MD   1,000 mg at 12/29/15 0915  . doxycycline (VIBRA-TABS) tablet 100 mg  100 mg Oral Q12H Jolanta B Pucilowska, MD   100 mg at 12/29/15 0915  . fluconazole (DIFLUCAN) tablet 100 mg  100 mg Oral Daily Jolanta B Pucilowska, MD   100 mg at 12/29/15 0915  . fluticasone (FLONASE) 50 MCG/ACT nasal spray 2 spray  2 spray Each Nare Daily Clovis Fredrickson, MD   2 spray at 12/29/15 1000  . fluvoxaMINE (LUVOX)  tablet 200 mg  200 mg Oral QHS Jolanta B Pucilowska, MD   200 mg at 12/28/15 2113  . guaiFENesin-dextromethorphan (ROBITUSSIN DM) 100-10 MG/5ML syrup 5 mL  5 mL Oral Q4H PRN Clovis Fredrickson, MD   5 mL at 12/27/15 2157  . loratadine (CLARITIN) tablet 10 mg  10 mg Oral Daily Clovis Fredrickson, MD   10 mg at 12/29/15 0915  . magic mouthwash w/lidocaine  10 mL Oral QID PRN Jolanta B Pucilowska, MD      . mupirocin ointment (BACTROBAN) 2 %   Topical QID Jolanta B Pucilowska, MD      . nystatin (MYCOSTATIN) 100000 UNIT/ML suspension 500,000 Units  5 mL Oral QID Clovis Fredrickson, MD   500,000 Units at 12/29/15 0919  . oxyCODONE-acetaminophen (PERCOCET) 7.5-325 MG per tablet 1 tablet  1 tablet Oral Q6H PRN Clovis Fredrickson, MD   1 tablet at 12/28/15 1947  . risperiDONE (RISPERDAL) tablet 2 mg  2 mg Oral BID Clovis Fredrickson, MD   2 mg at 12/29/15 0916  . sucralfate (CARAFATE) tablet 1 g  1 g Oral TID WC & HS Jolanta B Pucilowska, MD   1 g at 12/29/15 1321  . temazepam (RESTORIL) capsule 15 mg  15 mg Oral QHS Clovis Fredrickson, MD   15 mg at 12/28/15 2109  . tuberculin injection 5 Units  5 Units Intradermal Once Clovis Fredrickson, MD   5 Units at 12/28/15 1439     Discharge Medications: Please see discharge summary for a list of discharge medications.  Relevant Imaging Results:  Relevant Lab Results:   Additional Information SSN: [id number]  [place], LCSWA

## 2015-12-29 NOTE — Progress Notes (Signed)
   12/29/15 1613  What Happened  Was fall witnessed? No  Was patient injured? No  Patient found on floor  Found by Staff-comment ([employer])  Stated prior activity other (comment) (bathroom assisted )  Follow Up  MD notified Dr. Weber Cooks  Time MD notified [phone]  Family notified No- patient refusal  Additional tests No  Simple treatment Other (comment) (toileted, change of clothes)  Progress note created (see row info) Yes  Adult Fall Risk Assessment  Risk Factor Category (scoring not indicated) History of more than one fall within 6 months before admission (document High fall risk)  Age 55  Fall History: Fall within 6 months prior to admission 5  Elimination; Bowel and/or Urine Incontinence 0  Elimination; Bowel and/or Urine Urgency/Frequency 2  Medications: includes PCA/Opiates, Anti-convulsants, Anti-hypertensives, Diuretics, Hypnotics, Laxatives, Sedatives, and Psychotropics 5  Patient Care Equipment 0  Mobility-Assistance 0  Mobility-Gait 2  Mobility-Sensory Deficit 0  Altered awareness of immediate physical environment 0  Impulsiveness 0  Lack of understanding of one's physical/cognitive limitations 0  Total Score 14  Patient's Fall Risk High Fall Risk (>13 points)  Adult Fall Risk Interventions  Required Bundle Interventions *See Row Information* High fall risk - low, moderate, and high requirements implemented  Additional Interventions Room near nurses station;Other (Comment) (low bed, bed, chair alarm )

## 2015-12-29 NOTE — Progress Notes (Signed)
Pt denies SI/HI/AVHH. Isolated to room. Affect depressed. Appears confused at times needing redirection back to her room. Medication compliant. Pt only complaint was pain.  Voices no additional concerns.

## 2015-12-29 NOTE — BHH Group Notes (Signed)
[facility] [facility] LCSW Group Therapy  12/29/2015 1:37 PM  Type of Therapy:  Group Therapy  Participation Level:  Did Not Attend but was invited to attend group.   Summary of Progress/Problems:  Emotional Regulation: Patients will identify both negative and positive emotions. They will discuss emotions they have difficulty regulating and how they impact their lives. Patients will be asked to identify healthy coping skills to combat unhealthy reactions to negative emotions.    [place] G. McDonald, [place]  12/29/2015, 1:37 PM

## 2015-12-29 NOTE — Progress Notes (Signed)
[name] MD Progress Note  12/29/2015 7:14 PM Charlene [name]  MRN:  [id number]  Subjective:  Ms. Charlene [name] is a 55 year old unfortunate female with a history of severe bipolar disorder end-stage four small lung carcinoma. Charlene [name] was admitted in a manic episode over unclear etiology. Charlene [name] has brain metastases therefore treated with radiation up until admission. Charlene [name] had a course of dexamethasone just prior to admission. Charlene [name] has also been off Charlene [name] bipolar medications during the course of counseling or treatment at least since January 2017. Charlene [name] initially presented manic and euphoric. Everything was great. Charlene [name] believes Charlene [name] was cured of cancer. Charlene [name] believes that Charlene [name] Medicaid and disability came through. For the past 2 days the patient appears depressed. Charlene [name] situation were changed dramatically as Charlene [name] may not return home with Charlene [name] family as there are more safe people there. Apparently both bipolar disorder and lack cancer runs in the family. Charlene [name] will be placed in assisted living facility and restarted [facility] application. Following discharge Charlene [name] will continue brain radiation therapy for cancer. Charlene [name] doesn't have insurance or disability at present. It is possible that the patient started to recognize Charlene [name] true situation.  Patient interviewed and chart reviewed. Patient herself says Charlene [name] is feeling fine. In fact Charlene [name] says Charlene [name] mood is very happy. Charlene [name] tells me that Charlene [name] cancer is completely gone. This is somewhat at odds with Charlene [name] affect which appears flat and blunted and withdrawn. Nursing tells me Charlene [name] had a fall earlier today although fortunately Charlene [name] did not hit Charlene [name] head or herself. Patient herself is vague about whether Charlene [name] feels oversedated  Principal Problem: Bipolar I disorder, most recent episode manic, severe with psychotic features ([place]) Diagnosis:   Patient Active Problem List   Diagnosis Date Noted  . Acute psychosis associated with endocrine, metabolic, or cerebrovascular disorder [F06.8, F01.50] 12/21/2015  . Cocaine use  disorder, moderate, dependence ([place]) [F14.20] 12/21/2015  . Bipolar I disorder, most recent episode manic, severe with psychotic features ([place]) [F31.2] 12/21/2015  . OCD (obsessive compulsive disorder) [F42.9] 12/21/2015  . PTSD (post-traumatic stress disorder) [F43.10] 12/21/2015  . Abnormal cells of cervix [N87.9] 07/30/2015  . Encounter for general adult medical examination without abnormal findings [Z00.00] 07/28/2015  . Primary cancer of right lower lobe of lung ([place]) [C34.31] 07/12/2015  . At risk for noncompliance [Z87.898] 06/29/2015  . Spasm [R25.2] 06/26/2015  . Essential (primary) hypertension [I10] 06/21/2015  . Lung mass [R91.8] 06/21/2015  . Candida infection of mouth [B37.0] 06/21/2015  . Small cell carcinoma of lung ([place]) [C34.90] 06/21/2015  . Episodic mood disorder ([place]) [F39] 10/14/2014  . Chronic obstructive pulmonary disease ([place]) [J44.9] 10/06/2014  . Pulmonary emphysema ([place]) [J43.9] 10/06/2014  . LBP (low back pain) [M54.5] 09/27/2014  . Tobacco use disorder [F17.200] 09/27/2014   Total Time spent with patient: 20 minutes  Past Psychiatric History: Bipolar disorder. Past Medical History:  Past Medical History  Diagnosis Date  . Primary cancer of right lower lobe of lung ([place]) 07/12/2015    Small cell undifferentiated carcinoma of lung.  Diagnosis at [facility] by fine-needle aspiration of lymph node (January, 2017)  . Bipolar 2 disorder ([place])   . Borderline schizophrenia   . Falls infrequently     Past Surgical History  Procedure Laterality Date  . Abdominal hysterectomy    . Appendectomy    . Stomach surgery    . Nasal sinus surgery      x2  . Peripheral vascular catheterization N/A 07/30/2015    Procedure: Porta Cath Insertion;  Surgeon: Algernon Huxley, MD;  Location: [facility];  Service: Cardiovascular;  Laterality: N/A;   Family History: History reviewed. No pertinent family history. Family Psychiatric  History:Bipolar disorder in Charlene [name]  mother and brother. Social History:  History  Alcohol Use No     History  Drug Use  . Yes  . Special: Cocaine    Comment: States due to cocaine in mouthwash    Social History   Social History  . Marital Status: Divorced    Spouse Name: N/A  . Number of Children: N/A  . Years of Education: N/A   Social History Main Topics  . Smoking status: Former Smoker -- 0.50 packs/day for 30 years    Quit date: 06/17/2015  . Smokeless tobacco: None  . Alcohol Use: No  . Drug Use: Yes    Special: Cocaine     Comment: States due to cocaine in mouthwash  . Sexual Activity: Not Asked   Other Topics Concern  . None   Social History Narrative   Additional Social History:                         Sleep: Fair  Appetite:  Fair  Current Medications: Current Facility-Administered Medications  Medication Dose Route Frequency Provider Last Rate Last Dose  . albuterol (PROVENTIL HFA;VENTOLIN HFA) 108 (90 Base) MCG/ACT inhaler 2 puff  2 puff Inhalation Q4H PRN Clovis Fredrickson, MD   2 puff at 12/27/15 2056  . cyclobenzaprine (FLEXERIL) tablet 10 mg  10 mg Oral TID Clovis Fredrickson, MD   10 mg at 12/29/15 0915  . diclofenac (VOLTAREN) EC tablet 75 mg  75 mg Oral BID Clovis Fredrickson, MD   75 mg at 12/29/15 0915  . divalproex (DEPAKOTE) DR tablet 1,000 mg  1,000 mg Oral Q12H Jolanta B Pucilowska, MD   1,000 mg at 12/29/15 0915  . doxycycline (VIBRA-TABS) tablet 100 mg  100 mg Oral Q12H Jolanta B Pucilowska, MD   100 mg at 12/29/15 0915  . fluconazole (DIFLUCAN) tablet 100 mg  100 mg Oral Daily Jolanta B Pucilowska, MD   100 mg at 12/29/15 0915  . fluticasone (FLONASE) 50 MCG/ACT nasal spray 2 spray  2 spray Each Nare Daily Clovis Fredrickson, MD   2 spray at 12/29/15 1000  . fluvoxaMINE (LUVOX) tablet 200 mg  200 mg Oral QHS Jolanta B Pucilowska, MD   200 mg at 12/28/15 2113  . guaiFENesin-dextromethorphan (ROBITUSSIN DM) 100-10 MG/5ML syrup 5 mL  5 mL Oral Q4H PRN Clovis Fredrickson, MD   5 mL at 12/27/15 2157  . loratadine (CLARITIN) tablet 10 mg  10 mg Oral Daily Clovis Fredrickson, MD   10 mg at 12/29/15 0915  . magic mouthwash w/lidocaine  10 mL Oral QID PRN Jolanta B Pucilowska, MD      . mupirocin ointment (BACTROBAN) 2 %   Topical QID Jolanta B Pucilowska, MD      . nystatin (MYCOSTATIN) 100000 UNIT/ML suspension 500,000 Units  5 mL Oral QID Clovis Fredrickson, MD   500,000 Units at 12/29/15 0919  . oxyCODONE-acetaminophen (PERCOCET) 7.5-325 MG per tablet 1 tablet  1 tablet Oral Q6H PRN Clovis Fredrickson, MD   1 tablet at 12/28/15 1947  . risperiDONE (RISPERDAL) tablet 2 mg  2 mg Oral BID Clovis Fredrickson, MD   2 mg at 12/29/15 0916  . sucralfate (CARAFATE) tablet 1 g  1 g Oral  TID WC & HS Jolanta B Pucilowska, MD   1 g at 12/29/15 1646  . temazepam (RESTORIL) capsule 15 mg  15 mg Oral QHS Clovis Fredrickson, MD   15 mg at 12/28/15 2109  . tuberculin injection 5 Units  5 Units Intradermal Once Clovis Fredrickson, MD   5 Units at 12/28/15 1439    Lab Results:  Results for orders placed or performed during the hospital encounter of 12/21/15 (from the past 48 hour(s))  Valproic acid level     Status: None   Collection Time: 12/28/15  6:25 AM  Result Value Ref Range   Valproic Acid Lvl 76 50.0 - 100.0 ug/mL  Ammonia     Status: None   Collection Time: 12/28/15  6:25 AM  Result Value Ref Range   Ammonia 17 9 - 35 umol/L    Blood Alcohol level:  No results found for: [facility]  Metabolic Disorder Labs: Lab Results  Component Value Date   HGBA1C 6.2* 12/22/2015   Lab Results  Component Value Date   PROLACTIN 32.4* 12/22/2015   Lab Results  Component Value Date   CHOL 179 12/22/2015   TRIG 246* 12/22/2015   HDL 52 12/22/2015   CHOLHDL 3.4 12/22/2015   VLDL 49* 12/22/2015   LDLCALC 78 12/22/2015    Physical Findings: AIMS: Facial and Oral Movements Muscles of Facial Expression: None, normal Lips and Perioral Area: None,  normal Jaw: None, normal Tongue: None, normal,Extremity Movements Upper (arms, wrists, hands, fingers): None, normal Lower (legs, knees, ankles, toes): None, normal, Trunk Movements Neck, shoulders, hips: None, normal, Overall Severity Severity of abnormal movements (highest score from questions above): None, normal Incapacitation due to abnormal movements: None, normal Patient's awareness of abnormal movements (rate only patient's report): No Awareness, Dental Status Current problems with teeth and/or dentures?: No Does patient usually wear dentures?: No  CIWA:    COWS:     Musculoskeletal: Strength & Muscle Tone: within normal limits Gait & Station: normal Patient leans: N/A  Psychiatric Specialty Exam: Physical Exam  Nursing note and vitals reviewed. Constitutional: Charlene [name] appears well-nourished. Charlene [name] appears listless. Charlene [name] appears cachectic. Charlene [name] appears distressed.  HENT:  Head: Normocephalic and atraumatic.    Eyes: Conjunctivae are normal. Pupils are equal, round, and reactive to light.  Neck: Normal range of motion.  Cardiovascular: Normal heart sounds.   Respiratory: Effort normal.  GI: Soft.  Musculoskeletal: Normal range of motion.  Neurological: Charlene [name] appears listless.  Skin: Skin is warm and dry.  Psychiatric: Charlene [name] affect is blunt. Charlene [name] speech is delayed. Charlene [name] is slowed. Thought content is delusional. Charlene [name] expresses inappropriate judgment. Charlene [name] expresses no suicidal ideation. Charlene [name] exhibits abnormal recent memory.    Review of Systems  HENT: Negative.   Eyes: Negative.   Respiratory: Negative.   Cardiovascular: Negative.   Gastrointestinal: Negative.   Musculoskeletal: Positive for falls.  Skin: Negative.   Neurological: Positive for weakness.  Psychiatric/Behavioral: Negative for depression, suicidal ideas, hallucinations, memory loss and substance abuse. The patient is not nervous/anxious and does not have insomnia.   All other systems reviewed and are negative.    Blood pressure 105/55, pulse 110, temperature 97.4 F (36.3 C), temperature source Oral, resp. rate 16, height '5\' 8"'$  (1.727 m), weight 67.586 kg (149 lb), SpO2 98 %.Body mass index is 22.66 kg/(m^2).  General Appearance: Casual  Eye Contact:  Good  Speech:  Clear and Coherent  Volume:  Decreased  Mood:  Depressed  Affect:  Blunt  Thought Process:  Goal Directed  Orientation:  Full (Time, Place, and Person)  Thought Content:  Illogical, Delusions and Paranoid Ideation  Suicidal Thoughts:  No  Homicidal Thoughts:  No  Memory:  Immediate;   Fair Recent;   Fair Remote;   Fair  Judgement:  Impaired  Insight:  Lacking  Psychomotor Activity:  Decreased  Concentration:  Concentration: Fair and Attention Span: Fair  Recall:  [employer] of Knowledge:  Fair  Language:  Fair  Akathisia:  No  Handed:  Right  AIMS (if indicated):     Assets:  Communication Skills Desire for Improvement Resilience Social Support  ADL's:  Intact  Cognition:  WNL  Sleep:  Number of Hours: 8     Treatment Plan Summary: Daily contact with patient to assess and evaluate symptoms and progress in treatment and Medication management   Charlene [name] is a 55 year old female with a history of bipolar disorder and stage IV cancer admitted in the manic psychotic episode while off medications.   Follow-up note for Saturday the 15th. Patient is still reporting Charlene [name] mood being very good but Charlene [name] affect is looking blunted. Not clear if this is the beginning of a depression or not. Nursing is concerned that Charlene [name] could be over sedated although reviewing the medicine I don't see anything that looks like it's obviously unnecessary. Patient is now on close observation and is going to be in a wheelchair. Charlene [name] agrees to the plan although Charlene [name] is having a little trouble complying with staying in bed. No change to medicine for today we will continue to monitor this and evaluate as it changes.  1. Mood and psychosis. We started Depakote  for mood stabilization and Risperdal for psychosis. VPA 54, ammonia 17 on 7/14/207.  2. Anxiety of OCD or PTSD type. We will increase Luvox to 200 mg for OCD and depression.  3. COPD. We will continue inhalers.  4. Smoking. Nicotine patch is available.  5. Insomnia. Charlene [name] did not respond to Trazodone. We started Restoril.   6. Chronic pain. Charlene [name] has been taking Voltaren and narcotic painkillers. We increased Flexeril to 10 mg three times daily. Charlene [name] reports neck and shoulder painl.   7. Thrush. Charlene [name] is on Diflucan and nystatin.  8. GERD. Charlene [name] is on Carafate.  9. Cancer treatment. Spoke with Charlene [name] providers at the cancer center. Following discharge, Charlene [name] will continue brain radiation therapy at the cancer center. Unfortunately, contrary to what the patient believes, Charlene [name] is NOT cured of cancer.   10. Metabolic syndrome screening. Lipid panel, TSH, hemoglobin A1c are normal. Prolactin 32.   11. Cellulitis. We started Vibramycin and bactroban.  12. Deconditioning. PT consult is greatly appreciated.   13. Disposition. Charlene [name] will bee discharged to assisted living facility as the family is unable to care for Charlene [name]. PPD placed. P[facility]AR application submitted. Charlene [name] will folow up with [facility].  Alethia Berthold, MD 12/29/2015, 7:14 PM

## 2015-12-29 NOTE — Progress Notes (Signed)
Patient placed on 1:1 with staff rt continued non compliance with fall protocol. Patient appropriate and verbally cooperative with falls protocol. At this time patient non compliant with fall protocol as writer is setting patient up in chair with chair alarm. Staff present at bedside. Patient toileted, vs monitored and recorded. Transferred to bed with bed alarm charging and chair alarm on patient. Staff at side. Safety maintained.

## 2015-12-29 NOTE — Plan of Care (Signed)
Problem: Education: Goal: Knowledge of [place] [facility] Education information/materials will improve Outcome: Progressing Review falls protocol.

## 2015-12-29 NOTE — Progress Notes (Addendum)
Patient with depressed affect, cooperative with meals, meds and plan of care. No SI/HI at this time. Patient sleepy in bed. Appropriate when staff initiates interaction. Review plan of care and fall protocol. Safety maintained. Patient ambulates with slow and steady gait and walker. Sleepy and does not complete daily audit sheet this am.

## 2015-12-29 NOTE — BHH Group Notes (Signed)
[place] LCSW Group Therapy   12/28/2015 1PM   Type of Therapy: Group Therapy   Participation Level: Active   Participation Quality: Attentive, Sharing and Supportive   Affect: Appropriate   Cognitive: Alert and Oriented   Insight: Developing/Improving and Engaged   Engagement in Therapy: Developing/Improving and Engaged   Modes of Intervention: Clarification, Confrontation, Discussion, Education, Exploration, Limit-setting, Orientation, Problem-solving, Rapport Building, Art therapist, Socialization and Support   Summary of Progress/Problems: The topic for today was feelings about relapse. Pt discussed what relapse prevention is to them and identified triggers that they are on the path to relapse. Pt processed their feeling towards relapse and was able to relate to peers. Pt discussed coping skills that can be used for relapse prevention. Pt shared that in order to prevent relapse the pt has to learn to "enjoy my life".  When prompted by the CSW to clarify the pt began to speak quickly and with disconnected thoughts and with slowed speech patterns and slurring of her words, and presented as fatigued.  Pt presented as pleasant and calm, aeb the pt being able to listen more than talk which was unusual for the pt aeb previous sessions.  Pt was polite and cooperative with the CSW and other group members and focused and attentive to the topics discussed and the sharing of others.  Pt did share that the pt could talk to family members.  Pt seems to have made great improvement during group, as evidenced by decreased sharing, sharing at length with the CSW when prompted and pt presents a happy although with less energetic, as compared to previous sessions.    Alphonse Guild. Nikoleta Dady, MSW, LCSWA, LCAS

## 2015-12-29 NOTE — Progress Notes (Signed)
Patient noted to be sleepy during lunch time. Patient ambulates with walker and supervision of 1 to bathroom for toileting and then assisted to recliner to sleep. Unable to talk with sister on phone at this time as patient is sleepy. Falls protocol in place and safety maintained.

## 2015-12-29 NOTE — Progress Notes (Signed)
Patient forgetful or slightly confused and remains non compliant with fall protocol. Patient in recliner in room with chair alarm in place. Patient eating dinner on table tray. Nurse in room at least once and re enforces falls protocol and fall alarm. Nurse returns to nurse's station and patient noted to stand from recliner. Nurse to room and patient with small smile and states "I knew you were coming". Safety maintained at this time.

## 2015-12-30 ENCOUNTER — Inpatient Hospital Stay: Payer: Medicaid Other

## 2015-12-30 ENCOUNTER — Inpatient Hospital Stay
Admission: AD | Admit: 2015-12-30 | Discharge: 2016-01-11 | DRG: 871 | Disposition: A | Discharge status: Home / Self Care | Payer: Medicaid Other | Source: Ambulatory Visit | Attending: Internal Medicine | Admitting: Internal Medicine

## 2015-12-30 ENCOUNTER — Encounter: Payer: Self-pay | Admitting: Specialist

## 2015-12-30 DIAGNOSIS — Z79891 Long term (current) use of opiate analgesic: Secondary | ICD-10-CM | POA: Diagnosis not present

## 2015-12-30 DIAGNOSIS — T451X5A Adverse effect of antineoplastic and immunosuppressive drugs, initial encounter: Secondary | ICD-10-CM | POA: Diagnosis present

## 2015-12-30 DIAGNOSIS — Z882 Allergy status to sulfonamides status: Secondary | ICD-10-CM

## 2015-12-30 DIAGNOSIS — J181 Lobar pneumonia, unspecified organism: Secondary | ICD-10-CM

## 2015-12-30 DIAGNOSIS — I959 Hypotension, unspecified: Secondary | ICD-10-CM | POA: Diagnosis present

## 2015-12-30 DIAGNOSIS — D649 Anemia, unspecified: Secondary | ICD-10-CM | POA: Diagnosis present

## 2015-12-30 DIAGNOSIS — C3431 Malignant neoplasm of lower lobe, right bronchus or lung: Secondary | ICD-10-CM | POA: Diagnosis present

## 2015-12-30 DIAGNOSIS — D6481 Anemia due to antineoplastic chemotherapy: Secondary | ICD-10-CM | POA: Diagnosis present

## 2015-12-30 DIAGNOSIS — D638 Anemia in other chronic diseases classified elsewhere: Secondary | ICD-10-CM | POA: Diagnosis present

## 2015-12-30 DIAGNOSIS — J9601 Acute respiratory failure with hypoxia: Secondary | ICD-10-CM | POA: Diagnosis present

## 2015-12-30 DIAGNOSIS — B029 Zoster without complications: Secondary | ICD-10-CM | POA: Diagnosis present

## 2015-12-30 DIAGNOSIS — Z87891 Personal history of nicotine dependence: Secondary | ICD-10-CM

## 2015-12-30 DIAGNOSIS — J189 Pneumonia, unspecified organism: Secondary | ICD-10-CM | POA: Diagnosis present

## 2015-12-30 DIAGNOSIS — A419 Sepsis, unspecified organism: Principal | ICD-10-CM | POA: Diagnosis present

## 2015-12-30 DIAGNOSIS — Y95 Nosocomial condition: Secondary | ICD-10-CM | POA: Diagnosis present

## 2015-12-30 DIAGNOSIS — Z79899 Other long term (current) drug therapy: Secondary | ICD-10-CM | POA: Diagnosis not present

## 2015-12-30 DIAGNOSIS — F312 Bipolar disorder, current episode manic severe with psychotic features: Secondary | ICD-10-CM | POA: Diagnosis present

## 2015-12-30 DIAGNOSIS — Z880 Allergy status to penicillin: Secondary | ICD-10-CM | POA: Diagnosis not present

## 2015-12-30 DIAGNOSIS — J45909 Unspecified asthma, uncomplicated: Secondary | ICD-10-CM | POA: Diagnosis present

## 2015-12-30 DIAGNOSIS — I10 Essential (primary) hypertension: Secondary | ICD-10-CM | POA: Diagnosis present

## 2015-12-30 DIAGNOSIS — R0902 Hypoxemia: Secondary | ICD-10-CM

## 2015-12-30 DIAGNOSIS — D62 Acute posthemorrhagic anemia: Secondary | ICD-10-CM | POA: Diagnosis present

## 2015-12-30 DIAGNOSIS — F3189 Other bipolar disorder: Secondary | ICD-10-CM | POA: Diagnosis not present

## 2015-12-30 DIAGNOSIS — G9340 Encephalopathy, unspecified: Secondary | ICD-10-CM | POA: Diagnosis present

## 2015-12-30 DIAGNOSIS — Z452 Encounter for adjustment and management of vascular access device: Secondary | ICD-10-CM

## 2015-12-30 HISTORY — DX: Essential (primary) hypertension: I10

## 2015-12-30 HISTORY — DX: Unspecified asthma, uncomplicated: J45.909

## 2015-12-30 LAB — COMPREHENSIVE METABOLIC PANEL
ALT: 25 U/L (ref 14–54)
AST: 42 U/L — AB (ref 15–41)
Albumin: 2.2 g/dL — ABNORMAL LOW (ref 3.5–5.0)
Alkaline Phosphatase: 66 U/L (ref 38–126)
Anion gap: 7 (ref 5–15)
BILIRUBIN TOTAL: 0.3 mg/dL (ref 0.3–1.2)
BUN: 14 mg/dL (ref 6–20)
CO2: 28 mmol/L (ref 22–32)
CREATININE: 0.57 mg/dL (ref 0.44–1.00)
Calcium: 8.3 mg/dL — ABNORMAL LOW (ref 8.9–10.3)
Chloride: 103 mmol/L (ref 101–111)
GFR calc Af Amer: >60 mL/min (ref >60)
Glucose, Bld: 106 mg/dL — ABNORMAL HIGH (ref 65–99)
Potassium: 3.3 mmol/L — ABNORMAL LOW (ref 3.5–5.1)
Sodium: 138 mmol/L (ref 135–145)
TOTAL PROTEIN: 5.5 g/dL — AB (ref 6.5–8.1)

## 2015-12-30 LAB — CBC WITH DIFFERENTIAL/PLATELET
BASOS ABS: 0 10*3/uL (ref 0–0.1)
Basophils Relative: 0 %
Eosinophils Absolute: 0 10*3/uL (ref 0–0.7)
Eosinophils Relative: 0 %
HEMATOCRIT: 25.2 % — AB (ref 35.0–47.0)
HEMOGLOBIN: 8.5 g/dL — AB (ref 12.0–16.0)
LYMPHS PCT: 5 %
Lymphs Abs: 0.4 10*3/uL — ABNORMAL LOW (ref 1.0–3.6)
MCH: 34.1 pg — ABNORMAL HIGH (ref 26.0–34.0)
MCHC: 33.9 g/dL (ref 32.0–36.0)
MCV: 100.5 fL — AB (ref 80.0–100.0)
Monocytes Absolute: 0.8 10*3/uL (ref 0.2–0.9)
Monocytes Relative: 9 %
NEUTROS ABS: 7.7 10*3/uL — AB (ref 1.4–6.5)
Neutrophils Relative %: 86 %
Platelets: 152 10*3/uL (ref 150–440)
RBC: 2.51 MIL/uL — AB (ref 3.80–5.20)
RDW: 14.3 % (ref 11.5–14.5)
WBC: 9 10*3/uL (ref 3.6–11.0)

## 2015-12-30 LAB — VALPROIC ACID LEVEL: VALPROIC ACID LVL: 73 ug/mL (ref 50.0–100.0)

## 2015-12-30 MED ORDER — TEMAZEPAM 7.5 MG PO CAPS
7.5000 mg | ORAL_CAPSULE | Freq: Every day | ORAL | Status: DC
  Administered 2015-12-30: 7.5 mg via ORAL
  Filled 2015-12-30: qty 1

## 2015-12-30 MED ORDER — RISPERIDONE 1 MG PO TABS
1.0000 mg | ORAL_TABLET | Freq: Two times a day (BID) | ORAL | Status: DC
  Administered 2015-12-30: 1 mg via ORAL
  Filled 2015-12-30: qty 1

## 2015-12-30 NOTE — Progress Notes (Signed)
Responded to Rapid Response. Pt unresponsive with an O2 saturation of 80% on room air. Placed pt on NRB. O2 saturation is now 95%.

## 2015-12-30 NOTE — Plan of Care (Signed)
Problem: Education: Goal: Knowledge of [name] Education information/materials will improve Outcome: Not Progressing Review plan of care, fall protocol and one to one safety sitter with patient.

## 2015-12-30 NOTE — B[facility] Suicide Risk Assessment (Signed)
[place] INPATIENT:  Family/Significant Other Suicide Prevention Education  Suicide Prevention Education:  Education Completed; Jeneen Rinks "[name]" [place] Crane (father) [phone]   has been identified by the patient as the family member/significant other with whom the patient will be residing, and identified as the person(s) who will aid the patient in the event of a mental health crisis (suicidal ideations/suicide attempt).  With written consent from the patient, the family member/significant other has been provided the following suicide prevention education, prior to the and/or following the discharge of the patient.  The suicide prevention education provided includes the following:  Suicide risk factors  Suicide prevention and interventions  National Suicide Hotline telephone number  [facility] assessment telephone number  [facility] Emergency Assistance [place] and/or Residential Mobile Crisis Unit telephone number  Request made of family/significant other to:  Remove weapons (e.g., guns, rifles, knives), all items previously/currently identified as safety concern.    Remove drugs/medications (over-the-counter, prescriptions, illicit drugs), all items previously/currently identified as a safety concern.  The family member/significant other verbalizes understanding of the suicide prevention education information provided.  The family member/significant other agrees to remove the items of safety concern listed above.  [place] MSW, [place]  12/30/2015, 4:03 PM

## 2015-12-30 NOTE — Progress Notes (Signed)
Patient ID: Charlene [name], female   DOB: 1960/07/10, 55 y.o.   MRN: [id number] Chart reviewed. Have been speaking with nursing this evening. PAtient is now described as being "unresponsive" by nursing. Oxygen sat reported at 70%. BP low. Rapid response was requested and I now have spoken to on-call hospitalist requesting patient be reevaluated in person for possible transfer.

## 2015-12-30 NOTE — Progress Notes (Signed)
[facility] MD Progress Note  12/30/2015 5:54 PM Charlene [name]  MRN:  [id number] Subjective:  Follow-up for this 55 year old woman with psychotic depression, bipolar disorder, PTSD, lung cancer. Principal Problem: Bipolar I disorder, most recent episode manic, severe with psychotic features ([place]) Diagnosis:   Patient Active Problem List   Diagnosis Date Noted  . Acute psychosis associated with endocrine, metabolic, or cerebrovascular disorder [F06.8, F01.50] 12/21/2015  . Cocaine use disorder, moderate, dependence ([place]) [F14.20] 12/21/2015  . Bipolar I disorder, most recent episode manic, severe with psychotic features ([place]) [F31.2] 12/21/2015  . OCD (obsessive compulsive disorder) [F42.9] 12/21/2015  . PTSD (post-traumatic stress disorder) [F43.10] 12/21/2015  . Abnormal cells of cervix [N87.9] 07/30/2015  . Encounter for general adult medical examination without abnormal findings [Z00.00] 07/28/2015  . Primary cancer of right lower lobe of lung ([place]) [C34.31] 07/12/2015  . At risk for noncompliance [Z87.898] 06/29/2015  . Spasm [R25.2] 06/26/2015  . Essential (primary) hypertension [I10] 06/21/2015  . Lung mass [R91.8] 06/21/2015  . Candida infection of mouth [B37.0] 06/21/2015  . Small cell carcinoma of lung ([place]) [C34.90] 06/21/2015  . Episodic mood disorder ([place]) [F39] 10/14/2014  . Chronic obstructive pulmonary disease ([place]) [J44.9] 10/06/2014  . Pulmonary emphysema ([place]) [J43.9] 10/06/2014  . LBP (low back pain) [M54.5] 09/27/2014  . Tobacco use disorder [F17.200] 09/27/2014   Total Time spent with patient: 30 minutes  Past Psychiatric History: Patient has a history of bipolar disorder. Had recently presented to the hospital with somewhat euphoric mania with psychotic symptoms. Has been treated with mood stabilizers.  Past Medical History:  Past Medical History  Diagnosis Date  . Primary cancer of right lower lobe of lung ([place]) 07/12/2015    Small cell undifferentiated carcinoma  of lung.  Diagnosis at [facility] by fine-needle aspiration of lymph node (January, 2017)  . Bipolar 2 disorder ([place])   . Borderline schizophrenia   . Falls infrequently     Past Surgical History  Procedure Laterality Date  . Abdominal hysterectomy    . Appendectomy    . Stomach surgery    . Nasal sinus surgery      x2  . Peripheral vascular catheterization N/A 07/30/2015    Procedure: Glori Luis Cath Insertion;  Surgeon: Algernon Huxley, MD;  Location: [facility];  Service: Cardiovascular;  Laterality: N/A;   Family History:  Family History  Problem Relation Age of Onset  . Hypertension Father    Family Psychiatric  History: Patient currently not able to identify any Social History:  History  Alcohol Use No     History  Drug Use  . Yes  . Special: Cocaine    Comment: States due to cocaine in mouthwash    Social History   Social History  . Marital Status: Divorced    Spouse Name: N/A  . Number of Children: N/A  . Years of Education: N/A   Social History Main Topics  . Smoking status: Former Smoker -- 0.50 packs/day for 30 years    Quit date: 06/17/2015  . Smokeless tobacco: None  . Alcohol Use: No  . Drug Use: Yes    Special: Cocaine     Comment: States due to cocaine in mouthwash  . Sexual Activity: Not Asked   Other Topics Concern  . None   Social History Narrative   Additional Social History:  Sleep: Probably excessive at this point  Appetite:  Poor  Current Medications: Current Facility-Administered Medications  Medication Dose Route Frequency Provider Last Rate Last Dose  . albuterol (PROVENTIL HFA;VENTOLIN HFA) 108 (90 Base) MCG/ACT inhaler 2 puff  2 puff Inhalation Q4H PRN Clovis Fredrickson, MD   2 puff at 12/27/15 2056  . diclofenac (VOLTAREN) EC tablet 75 mg  75 mg Oral BID Clovis Fredrickson, MD   75 mg at 12/30/15 0936  . divalproex (DEPAKOTE) DR tablet 1,000 mg  1,000 mg Oral Q12H Jolanta B  Pucilowska, MD   1,000 mg at 12/30/15 0946  . doxycycline (VIBRA-TABS) tablet 100 mg  100 mg Oral Q12H Jolanta B Pucilowska, MD   100 mg at 12/30/15 0936  . fluconazole (DIFLUCAN) tablet 100 mg  100 mg Oral Daily Clovis Fredrickson, MD   100 mg at 12/30/15 0936  . fluticasone (FLONASE) 50 MCG/ACT nasal spray 2 spray  2 spray Each Nare Daily Clovis Fredrickson, MD   2 spray at 12/30/15 0945  . fluvoxaMINE (LUVOX) tablet 200 mg  200 mg Oral QHS Clovis Fredrickson, MD   200 mg at 12/29/15 2139  . guaiFENesin-dextromethorphan (ROBITUSSIN DM) 100-10 MG/5ML syrup 5 mL  5 mL Oral Q4H PRN Clovis Fredrickson, MD   5 mL at 12/30/15 0616  . loratadine (CLARITIN) tablet 10 mg  10 mg Oral Daily Clovis Fredrickson, MD   10 mg at 12/30/15 0936  . magic mouthwash w/lidocaine  10 mL Oral QID PRN Jolanta B Pucilowska, MD      . mupirocin ointment (BACTROBAN) 2 %   Topical QID Clovis Fredrickson, MD   1 application at 81/44/81 2140  . nystatin (MYCOSTATIN) 100000 UNIT/ML suspension 500,000 Units  5 mL Oral QID Clovis Fredrickson, MD   500,000 Units at 12/30/15 1705  . risperiDONE (RISPERDAL) tablet 1 mg  1 mg Oral BID Gonzella Lex, MD      . sucralfate (CARAFATE) tablet 1 g  1 g Oral TID WC & HS Jolanta B Pucilowska, MD   1 g at 12/30/15 1705  . temazepam (RESTORIL) capsule 7.5 mg  7.5 mg Oral QHS Gonzella Lex, MD        Lab Results:  Results for orders placed or performed during the hospital encounter of 12/21/15 (from the past 48 hour(s))  CBC with Differential/Platelet     Status: Abnormal   Collection Time: 12/30/15  3:13 PM  Result Value Ref Range   WBC 9.0 3.6 - 11.0 K/uL   RBC 2.51 (L) 3.80 - 5.20 MIL/uL   Hemoglobin 8.5 (L) 12.0 - 16.0 g/dL   HCT 25.2 (L) 35.0 - 47.0 %   MCV 100.5 (H) 80.0 - 100.0 fL   MCH 34.1 (H) 26.0 - 34.0 pg   MCHC 33.9 32.0 - 36.0 g/dL   RDW 14.3 11.5 - 14.5 %   Platelets 152 150 - 440 K/uL   Neutrophils Relative % 86 %   Neutro Abs 7.7 (H) 1.4 - 6.5 K/uL    Lymphocytes Relative 5 %   Lymphs Abs 0.4 (L) 1.0 - 3.6 K/uL   Monocytes Relative 9 %   Monocytes Absolute 0.8 0.2 - 0.9 K/uL   Eosinophils Relative 0 %   Eosinophils Absolute 0.0 0 - 0.7 K/uL   Basophils Relative 0 %   Basophils Absolute 0.0 0 - 0.1 K/uL  Comprehensive metabolic panel     Status: Abnormal   Collection Time: 12/30/15  3:13 PM  Result Value Ref Range   Sodium 138 135 - 145 mmol/L   Potassium 3.3 (L) 3.5 - 5.1 mmol/L   Chloride 103 101 - 111 mmol/L   CO2 28 22 - 32 mmol/L   Glucose, Bld 106 (H) 65 - 99 mg/dL   BUN 14 6 - 20 mg/dL   Creatinine, Ser 0.57 0.44 - 1.00 mg/dL   Calcium 8.3 (L) 8.9 - 10.3 mg/dL   Total Protein 5.5 (L) 6.5 - 8.1 g/dL   Albumin 2.2 (L) 3.5 - 5.0 g/dL   AST 42 (H) 15 - 41 U/L   ALT 25 14 - 54 U/L   Alkaline Phosphatase 66 38 - 126 U/L   Total Bilirubin 0.3 0.3 - 1.2 mg/dL   GFR calc non Af Amer >60 >60 mL/min   GFR calc Af Amer >60 >60 mL/min    Comment: (NOTE) The eGFR has been calculated using the CKD EPI equation. This calculation has not been validated in all clinical situations. eGFR's persistently <60 mL/min signify possible Chronic Kidney Disease.    Anion gap 7 5 - 15  Valproic acid level     Status: None   Collection Time: 12/30/15  3:13 PM  Result Value Ref Range   Valproic Acid Lvl 73 50.0 - 100.0 ug/mL    Blood Alcohol level:  No results found for: [facility]  Metabolic Disorder Labs: Lab Results  Component Value Date   HGBA1C 6.2* 12/22/2015   Lab Results  Component Value Date   PROLACTIN 32.4* 12/22/2015   Lab Results  Component Value Date   CHOL 179 12/22/2015   TRIG 246* 12/22/2015   HDL 52 12/22/2015   CHOLHDL 3.4 12/22/2015   VLDL 49* 12/22/2015   LDLCALC 78 12/22/2015    Physical Findings: AIMS: Facial and Oral Movements Muscles of Facial Expression: None, normal Lips and Perioral Area: None, normal Jaw: None, normal Tongue: None, normal,Extremity Movements Upper (arms, wrists, hands, fingers):  None, normal Lower (legs, knees, ankles, toes): None, normal, Trunk Movements Neck, shoulders, hips: None, normal, Overall Severity Severity of abnormal movements (highest score from questions above): None, normal Incapacitation due to abnormal movements: None, normal Patient's awareness of abnormal movements (rate only patient's report): No Awareness, Dental Status Current problems with teeth and/or dentures?: No Does patient usually wear dentures?: No  CIWA:    COWS:     Musculoskeletal: Strength & Muscle Tone: abnormal, decreased and atrophy Gait & Station: unable to stand Patient leans: N/A  Psychiatric Specialty Exam: Physical Exam  Vitals reviewed. Constitutional: She appears well-developed.  HENT:  Head: Normocephalic and atraumatic.  Eyes: Conjunctivae are normal. Pupils are equal, round, and reactive to light.  Neck: Normal range of motion.  Cardiovascular: Normal heart sounds.   Respiratory: She is in respiratory distress.  GI: Soft.  Musculoskeletal: Normal range of motion.  Neurological: She is alert.  Skin: Skin is warm and dry.  Psychiatric: Her affect is blunt. Her speech is delayed and slurred. She is slowed. Thought content is delusional. Cognition and memory are impaired. She is noncommunicative.    Review of Systems  Unable to perform ROS: mental acuity    Blood pressure 102/62, pulse 124, temperature 99.3 F (37.4 C), temperature source Oral, resp. rate 18, height '5\' 8"'$  (1.727 m), weight 67.586 kg (149 lb), SpO2 98 %.Body mass index is 22.66 kg/(m^2).  General Appearance: Disheveled  Eye Contact:  Minimal  Speech:  Garbled and Slow  Volume:  Decreased  Mood:  Euthymic  Affect:  Blunt, Constricted and Restricted  Thought Process:  Irrelevant  Orientation:  Negative  Thought Content:  Delusions  Suicidal Thoughts:  No  Homicidal Thoughts:  No  Memory:  Immediate;   Poor Recent;   Poor Remote;   Poor  Judgement:  Impaired  Insight:  Shallow   Psychomotor Activity:  Decreased  Concentration:  Concentration: Poor  Recall:  Poor  Fund of Knowledge:  Fair  Language:  Fair  Akathisia:  No  Handed:  Right  AIMS (if indicated):     Assets:  Financial Resources/Insurance Housing Resilience Social Support  ADL's:  Impaired  Cognition:  Impaired,  Mild  Sleep:  Number of Hours: 8     Treatment Plan Summary: Daily contact with patient to assess and evaluate symptoms and progress in treatment, Medication management and Plan As far as her psychiatric condition the patient continues to say she is feeling good and continues to express a believe that her cancer is cured. On the other hand her affect is now flat and appears sickly. Her activity level is much decreased. I am cutting back on several of her medications including the muscle relaxers the antianxiety medicines and antipsychotics all because of her new presentation in the last couple days of being oversedated. Patient has lung cancer. In the last couple days has started to look more sickly. Today clearly a different mental status. Blood pressure has dropped. Pulses elevated. Not eating well. I requested a medicine consult. Verbally I'm told that so far they have not had a diagnosis of any specific new condition. I do note that the chest x-ray has been read as abnormal compared to 1 from earlier this month. I have had nursing monitor her blood oxygen . Rest of the labs reviewed. Await further input from medicine.  Alethia Berthold, MD 12/30/2015, 5:54 PM

## 2015-12-30 NOTE — Progress Notes (Signed)
Patient remains sleepy in bed. Skin warm and dry. No distress, no restlessness, no s/s of pain. Patient sleepy. Does not eat dinner. Total po fluid intake is 240 ml this shift. Arterial blood gas drawn and patient roused to inform patient of procedure and patient returns to sleep. No urine output at this time. Staff aware urine sample requested.

## 2015-12-30 NOTE — Consult Note (Signed)
[facility] [place] NAME: Charlene [name]    MR#:  [id number]  DATE OF BIRTH:  02-Jan-1961  DATE OF CONSULT:  12/30/2015  PRIMARY CARE PHYSICIAN: Jiles Garter, MD   REQUESTING/REFERRING PHYSICIAN: Dr. Weber Cooks  CHIEF COMPLAINT:  No chief complaint on file. Weakness, Lethargy, Low O2 sats.   HISTORY OF PRESENT ILLNESS:  Charlene [name]  is a 55 y.o. female with a known history of Lung cancer, bipolar disorder, schizophrenia, generalized weakness and frequent falls who was admitted to behavioral medicine for treatment of underlying psychosis from bipolar. Hospitalist services were consulted today as patient has been more lethargic, weak, and her O2 sats  have been on the low side. Patient herself denies any shortness of breath, chest pain, fever, chills, abdominal pain, nausea, vomiting or any other associated symptoms presently. She does admit to a cough which is nonproductive in nature. Given her above symptoms hospitalist services were contacted to for further evaluation.  PAST MEDICAL HISTORY:   Past Medical History  Diagnosis Date  . Primary cancer of right lower lobe of lung ([place]) 07/12/2015    Small cell undifferentiated carcinoma of lung.  Diagnosis at [facility] by fine-needle aspiration of lymph node (January, 2017)  . Bipolar 2 disorder ([place])   . Borderline schizophrenia   . Falls infrequently     PAST SURGICAL HISTOIRY:   Past Surgical History  Procedure Laterality Date  . Abdominal hysterectomy    . Appendectomy    . Stomach surgery    . Nasal sinus surgery      x2  . Peripheral vascular catheterization N/A 07/30/2015    Procedure: Glori Luis Cath Insertion;  Surgeon: Algernon Huxley, MD;  Location: [facility];  Service: Cardiovascular;  Laterality: N/A;    SOCIAL HISTORY:   Social History  Substance Use Topics  . Smoking status: Former Smoker -- 0.50 packs/day for 30 years    Quit date: 06/17/2015  . Smokeless  tobacco: Not on file  . Alcohol Use: No    FAMILY HISTORY:   Family History  Problem Relation Age of Onset  . Hypertension Father     DRUG ALLERGIES:   Allergies  Allergen Reactions  . Amoxicillin-Pot Clavulanate Hives    Also vomiting  . Septra [Sulfamethoxazole-Trimethoprim] Hives and Nausea And Vomiting    REVIEW OF SYSTEMS:   Review of Systems  Constitutional: Negative for fever and weight loss.  HENT: Negative for congestion, nosebleeds and tinnitus.   Eyes: Negative for blurred vision, double vision and redness.  Respiratory: Positive for cough. Negative for hemoptysis and shortness of breath.   Cardiovascular: Negative for chest pain, orthopnea, leg swelling and PND.  Gastrointestinal: Negative for nausea, vomiting, abdominal pain, diarrhea and melena.  Genitourinary: Negative for dysuria, urgency and hematuria.  Musculoskeletal: Negative for joint pain and falls.  Neurological: Positive for weakness (Generalized). Negative for dizziness, tingling, sensory change, focal weakness, seizures and headaches.  Endo/Heme/Allergies: Negative for polydipsia. Does not bruise/bleed easily.  Psychiatric/Behavioral: Negative for depression and memory loss. The patient is not nervous/anxious.      MEDICATIONS AT HOME:   Prior to Admission medications   Medication Sig Start Date End Date Taking? Authorizing Provider  albuterol (PROVENTIL HFA) 108 (90 Base) MCG/ACT inhaler  09/19/15   Historical Provider, MD  benzonatate (TESSALON PERLES) 100 MG capsule Take 100 mg by mouth. 06/21/15 06/20/16  Historical Provider, MD  cyclobenzaprine (FLEXERIL) 10 MG tablet Take 10 mg by  mouth. 06/26/15   Historical Provider, MD  diclofenac (VOLTAREN) 75 MG EC tablet Take 75 mg by mouth. 06/14/15   Historical Provider, MD  Diphenhyd-Hydrocort-Nystatin (FIRST-DUKES MOUTHWASH) SUSP Take 40m by mouth, swish and swallow three times a day 08/30/15   LEvlyn Kanner NP  fluconazole (DIFLUCAN) 100 MG tablet  Take 1 tablet (100 mg total) by mouth daily. 12/11/15   GNoreene Filbert MD  fluticasone (FLONASE) 50 MCG/ACT nasal spray 1 spray by Each Nare route daily. 07/03/15 07/02/16  Historical Provider, MD  lidocaine (XYLOCAINE) 5 % ointment Apply topically. 06/21/15 06/20/16  Historical Provider, MD  loratadine (CLARITIN) 10 MG tablet Take 10 mg by mouth.    Historical Provider, MD  nystatin (MYCOSTATIN) 100000 UNIT/ML suspension Take by mouth. Reported on 11/01/2015 06/21/15   Historical Provider, MD  oxyCODONE-acetaminophen (PERCOCET) 7.5-325 MG tablet Take 1 tablet by mouth every 6 (six) hours as needed for severe pain. 11/22/15   GNoreene Filbert MD  prochlorperazine (COMPAZINE) 10 MG tablet Take 1 tablet (10 mg total) by mouth every 6 (six) hours as needed for nausea or vomiting. 12/17/15   LEvlyn Kanner NP  Saline (AYR SALINE NASAL DROPS) 0.65 % (Soln) SOLN 2 drops by Each Nare route every four (4) hours as needed. 07/03/15   Historical Provider, MD  sucralfate (CARAFATE) 1 g tablet Take 1 tablet (1 g total) by mouth 4 (four) times daily. Dissolve in 2-3 tbsp warm water, swish and swallow. 11/01/15   JForest Gleason MD      VITAL SIGNS:  Blood pressure 102/62, pulse 124, temperature 99.3 F (37.4 C), temperature source Oral, resp. rate 18, height '5\' 8"'$  (1.727 m), weight 67.586 kg (149 lb), SpO2 98 %.  PHYSICAL EXAMINATION:  GENERAL:  55y.o.-year-old patient lethargic but responsive in bed in no acute distress.  EYES: Pupils equal, round, reactive to light and accommodation. No scleral icterus. Extraocular muscles intact.  HEENT: Head atraumatic, normocephalic. Oropharynx and nasopharynx clear.  NECK:  Supple, no jugular venous distention. No thyroid enlargement, no tenderness.  LUNGS: Normal breath sounds bilaterally, no wheezing, rales, rhonchi . No use of accessory muscles of respiration.  CARDIOVASCULAR: S1, S2, RRR, tachy. No murmurs, rubs, gallops, clicks.  ABDOMEN: Soft, nontender, nondistended. Bowel  sounds present. No organomegaly or mass.  EXTREMITIES: No pedal edema, cyanosis, or clubbing.  NEUROLOGIC: Cranial nerves II through XII are intact. No focal motor or sensory deficits appreciated bilaterally.  Globally weak. PSYCHIATRIC: The patient is alert and oriented x 3. Flat affect. SKIN: No obvious rash, lesion, or ulcer.   LABORATORY PANEL:   CBC No results for input(s): WBC, HGB, HCT, PLT in the last 168 hours. ------------------------------------------------------------------------------------------------------------------  Chemistries  No results for input(s): NA, K, CL, CO2, GLUCOSE, BUN, CREATININE, CALCIUM, MG, AST, ALT, ALKPHOS, BILITOT in the last 168 hours.  Invalid input(s): GFRCGP ------------------------------------------------------------------------------------------------------------------  Cardiac Enzymes No results for input(s): TROPONINI in the last 168 hours. ------------------------------------------------------------------------------------------------------------------  RADIOLOGY:  Dg Chest 2 View  12/30/2015  CLINICAL DATA:  Hypoxia, stage IV brain cancer. EXAM: CHEST  2 VIEW COMPARISON:  Radiographs of December 20, 2015. FINDINGS: The heart size and mediastinal contours are within normal limits. Right internal jugular Port-A-Cath is unchanged in position. No pneumothorax is noted. Left lung is clear. Elevated right hemidiaphragm is noted with associated atelectasis or infiltrate. Some degree of pleural effusion cannot be excluded. The visualized skeletal structures are unremarkable. IMPRESSION: Interval development of elevated right hemidiaphragm with associated subsegmental atelectasis or infiltrate. Some degree of  pleural effusion cannot be excluded. Electronically Signed   By: Marijo Conception, M.D.   On: 12/30/2015 14:20     IMPRESSION AND PLAN:   55 year old female with past medical history of lung cancer, COPD, schizophrenia, bipolar disorder, who was  admitted to behavioral medicine for treatment of underlying psychosis but noted to be more lethargic, weak with low O2 sats this morning.  1. Weakness/lethargy-etiology unclear but likely multifactorial nature. Related to underlying malignancy, sedative meds, and also psychiatric condition. -No evidence of acute infectious etiology.  Await electrolyte results.  CXR (-) for infectious pathology.  - await UA.  Ammonia level normal.  Will get ABG to r/o CO2 retention.  - avoid sedative meds and follow clinically.   2. Hypoxia/Low O2 sats - likely related to underlying COPD, Lung Cancer.  - no evidence of acute COPD Exacerbation. CXR showing no pneumonia.  Pt. Is afebrile.  - will check ABG and follow up.   3. COPD - no acute exacerbation.  - cont. Albuterol inhaler PRN  4. Hx of Bipolar - cont. Care as per Psych.  - cont. Depakote, Risperdal.   5. Oral Thrush - improved.   - cont. Diflucan, Nystatin.    Thanks for consult and will follow with you.     All the records are reviewed and case discussed with Consulting provider. Management plans discussed with the patient, family and they are in agreement.  CODE STATUS: Full Code  TOTAL TIME TAKING CARE OF THIS PATIENT: 45 minutes.    Henreitta Leber M.D on 12/30/2015 at 3:01 PM  Between 7am to 6pm - Pager - [phone]  After 6pm go to [email or web address] - password [email or web address] [facility]  Office  [phone]  CC: Primary care Physician: Jiles Garter, MD

## 2015-12-30 NOTE — [facility] Group Notes (Signed)
[place] LCSW Group Therapy  12/30/2015 1:29 PM  Type of Therapy:  Group Therapy  Participation Level:  Did Not Attend  Modes of Intervention:  Discussion, Education, Socialization and Support  Summary of Progress/Problems: Mindfulness: Patient discussed mindfulness and relaxing techniques and why they are beneficial. Pt discussed ways to incorporate mindfulness in their lives. Pt practiced a mindfulness techique and discussed how it made them feel.   [place] MSW, [place]  12/30/2015, 1:29 PM

## 2015-12-30 NOTE — Progress Notes (Addendum)
Patient remains sleepy and tired at this time. Fair appetite with lunch as patient is drowsy. Vs monitored and recorded. Moist cough with beige flem expectorated. Skin warm and dry. MD into visit and considers orders. HOB elevated for food and fluids. Safety maintained. Consult MD into to review plan of care and orders labs. Chest ray (2 view) performed with results in. Patient fall risk, security, tech and orderly transport patient for xray on stretcher with safety maintained. Patient transferred from stretcher to bed with assist x 4. Safety maintained. Resting comfortably.

## 2015-12-30 NOTE — Progress Notes (Signed)
Patient with depressed affect, cooperative with meals and meds. Sleepy in bed this am. Patient vs monitored and patient is tired and weak. Po fluids encouraged. Uses prn pain med for chronic back pain with good result. Occasional dry cough. HOB elevated and patient to side of bed for meds. Remains on fall protocol with one to one safety sitter for poor compliance. OOB to wheelchair with assist x 1. No SI/HI at this time. Patient uses humor as coping skills. Safety maintained.

## 2015-12-30 NOTE — Progress Notes (Signed)
PPD negative at this time. No redness, no induration.

## 2015-12-30 NOTE — Progress Notes (Signed)
Rapid response called due to Pts O2 stat being in the 70s and being unresponsive and hard to arouse. MD notified. Orders received to transfer Pt to Oncology floor. Pt was placed on O2 and was transferred to room 116. Safety maintained. MD notified.

## 2015-12-30 NOTE — Progress Notes (Signed)
Pt affect depressed. Denies SI/AVH. Motor activity slow. Lethargic. Could not state who president was. Interaction minimal. Medication compliant. Voices no concerns other than pain. Safety maintained. Sitter at bedside for safety. Will continue to monitor.

## 2015-12-31 ENCOUNTER — Inpatient Hospital Stay: Payer: Medicaid Other

## 2015-12-31 DIAGNOSIS — Z9181 History of falling: Secondary | ICD-10-CM

## 2015-12-31 DIAGNOSIS — Z87891 Personal history of nicotine dependence: Secondary | ICD-10-CM

## 2015-12-31 DIAGNOSIS — D649 Anemia, unspecified: Secondary | ICD-10-CM

## 2015-12-31 DIAGNOSIS — C3431 Malignant neoplasm of lower lobe, right bronchus or lung: Secondary | ICD-10-CM

## 2015-12-31 DIAGNOSIS — G9340 Encephalopathy, unspecified: Secondary | ICD-10-CM | POA: Diagnosis present

## 2015-12-31 DIAGNOSIS — F3181 Bipolar II disorder: Secondary | ICD-10-CM

## 2015-12-31 DIAGNOSIS — B029 Zoster without complications: Secondary | ICD-10-CM | POA: Diagnosis present

## 2015-12-31 DIAGNOSIS — J189 Pneumonia, unspecified organism: Secondary | ICD-10-CM | POA: Diagnosis present

## 2015-12-31 DIAGNOSIS — A419 Sepsis, unspecified organism: Principal | ICD-10-CM

## 2015-12-31 DIAGNOSIS — Z9221 Personal history of antineoplastic chemotherapy: Secondary | ICD-10-CM

## 2015-12-31 LAB — CBC
HCT: 21.4 % — ABNORMAL LOW (ref 35.0–47.0)
Hemoglobin: 7.4 g/dL — ABNORMAL LOW (ref 12.0–16.0)
MCH: 33.8 pg (ref 26.0–34.0)
MCHC: 34.4 g/dL (ref 32.0–36.0)
MCV: 98.1 fL (ref 80.0–100.0)
Platelets: 154 10*3/uL (ref 150–440)
RBC: 2.18 MIL/uL — ABNORMAL LOW (ref 3.80–5.20)
RDW: 14.6 % — ABNORMAL HIGH (ref 11.5–14.5)
WBC: 8.8 10*3/uL (ref 3.6–11.0)

## 2015-12-31 LAB — URINALYSIS COMPLETE WITH MICROSCOPIC (ARMC ONLY)
BILIRUBIN URINE: NEGATIVE
Glucose, UA: NEGATIVE mg/dL
Nitrite: NEGATIVE
PH: 5 (ref 5.0–8.0)
PROTEIN: 30 mg/dL — AB
Specific Gravity, Urine: 1.019 (ref 1.005–1.030)

## 2015-12-31 LAB — COMPREHENSIVE METABOLIC PANEL
ALT: 22 U/L (ref 14–54)
ANION GAP: 6 (ref 5–15)
AST: 31 U/L (ref 15–41)
Albumin: 2.1 g/dL — ABNORMAL LOW (ref 3.5–5.0)
Alkaline Phosphatase: 65 U/L (ref 38–126)
BILIRUBIN TOTAL: 0.2 mg/dL — AB (ref 0.3–1.2)
BUN: 15 mg/dL (ref 6–20)
CALCIUM: 8 mg/dL — AB (ref 8.9–10.3)
CHLORIDE: 103 mmol/L (ref 101–111)
CO2: 30 mmol/L (ref 22–32)
Creatinine, Ser: 0.56 mg/dL (ref 0.44–1.00)
GLUCOSE: 87 mg/dL (ref 65–99)
POTASSIUM: 3.2 mmol/L — AB (ref 3.5–5.1)
Sodium: 139 mmol/L (ref 135–145)
TOTAL PROTEIN: 5.3 g/dL — AB (ref 6.5–8.1)

## 2015-12-31 LAB — HEMOGLOBIN AND HEMATOCRIT, BLOOD
HEMATOCRIT: 26.8 % — AB (ref 35.0–47.0)
HEMOGLOBIN: 9.2 g/dL — AB (ref 12.0–16.0)

## 2015-12-31 LAB — HEMOGLOBIN: Hemoglobin: 8.9 g/dL — ABNORMAL LOW (ref 12.0–16.0)

## 2015-12-31 LAB — ABO/RH: ABO/RH(D): O POS

## 2015-12-31 LAB — LACTIC ACID, PLASMA
LACTIC ACID, VENOUS: 0.6 mmol/L (ref 0.5–1.9)
LACTIC ACID, VENOUS: 0.7 mmol/L (ref 0.5–1.9)

## 2015-12-31 LAB — PREPARE RBC (CROSSMATCH)

## 2015-12-31 LAB — TROPONIN I: Troponin I: <0.03 ng/mL (ref <0.03)

## 2015-12-31 MED ORDER — HYDROCOD POLST-CPM POLST ER 10-8 MG/5ML PO SUER
5.0000 mL | Freq: Two times a day (BID) | ORAL | Status: DC
  Administered 2015-12-31 – 2016-01-11 (×22): 5 mL via ORAL
  Filled 2015-12-31 (×23): qty 5

## 2015-12-31 MED ORDER — VANCOMYCIN HCL IN DEXTROSE 1-5 GM/200ML-% IV SOLN
1000.0000 mg | Freq: Two times a day (BID) | INTRAVENOUS | Status: DC
  Administered 2015-12-31 – 2016-01-01 (×3): 1000 mg via INTRAVENOUS
  Filled 2015-12-31 (×6): qty 200

## 2015-12-31 MED ORDER — ONDANSETRON HCL 4 MG PO TABS
4.0000 mg | ORAL_TABLET | Freq: Four times a day (QID) | ORAL | Status: DC | PRN
  Administered 2016-01-03 – 2016-01-08 (×7): 4 mg via ORAL
  Filled 2015-12-31 (×7): qty 1

## 2015-12-31 MED ORDER — SODIUM CHLORIDE 0.9 % IV SOLN
8.0000 mg/h | INTRAVENOUS | Status: DC
  Administered 2015-12-31 – 2016-01-02 (×7): 8 mg/h via INTRAVENOUS
  Filled 2015-12-31 (×7): qty 80

## 2015-12-31 MED ORDER — HYDROCODONE-ACETAMINOPHEN 7.5-325 MG PO TABS
1.0000 | ORAL_TABLET | Freq: Four times a day (QID) | ORAL | Status: DC | PRN
  Administered 2015-12-31 – 2016-01-11 (×32): 1 via ORAL
  Filled 2015-12-31 (×32): qty 1

## 2015-12-31 MED ORDER — VANCOMYCIN HCL IN DEXTROSE 1-5 GM/200ML-% IV SOLN
1000.0000 mg | Freq: Once | INTRAVENOUS | Status: AC
  Administered 2015-12-31: 03:00:00 1000 mg via INTRAVENOUS
  Filled 2015-12-31: qty 200

## 2015-12-31 MED ORDER — ACETAMINOPHEN 325 MG PO TABS
650.0000 mg | ORAL_TABLET | Freq: Four times a day (QID) | ORAL | Status: DC | PRN
  Administered 2015-12-31 – 2016-01-11 (×8): 650 mg via ORAL
  Filled 2015-12-31 (×8): qty 2

## 2015-12-31 MED ORDER — SODIUM CHLORIDE 0.9 % IV BOLUS (SEPSIS)
1000.0000 mL | Freq: Once | INTRAVENOUS | Status: AC
  Administered 2015-12-31: 1000 mL via INTRAVENOUS

## 2015-12-31 MED ORDER — SODIUM CHLORIDE 0.9 % IV BOLUS (SEPSIS): 1000.0000 mL | INTRAVENOUS | Status: DC | PRN

## 2015-12-31 MED ORDER — DIVALPROEX SODIUM 500 MG PO DR TAB
500.0000 mg | DELAYED_RELEASE_TABLET | Freq: Three times a day (TID) | ORAL | Status: DC
  Administered 2015-12-31 – 2016-01-04 (×12): 500 mg via ORAL
  Filled 2015-12-31 (×12): qty 1

## 2015-12-31 MED ORDER — ACETAMINOPHEN 650 MG RE SUPP: 650.0000 mg | Freq: Four times a day (QID) | RECTAL | Status: DC | PRN

## 2015-12-31 MED ORDER — SODIUM CHLORIDE 0.9 % IV SOLN
INTRAVENOUS | Status: DC
  Administered 2015-12-31 (×2): via INTRAVENOUS

## 2015-12-31 MED ORDER — SODIUM CHLORIDE 0.9 % IV SOLN
1.0000 g | Freq: Three times a day (TID) | INTRAVENOUS | Status: DC
  Administered 2015-12-31 – 2016-01-04 (×14): 1 g via INTRAVENOUS
  Filled 2015-12-31 (×15): qty 1

## 2015-12-31 MED ORDER — SODIUM CHLORIDE 0.9 % IV SOLN: Freq: Once | INTRAVENOUS | Status: DC

## 2015-12-31 MED ORDER — PANTOPRAZOLE SODIUM 40 MG IV SOLR
40.0000 mg | Freq: Two times a day (BID) | INTRAVENOUS | Status: DC
  Administered 2016-01-03: 12:00:00 40 mg via INTRAVENOUS
  Filled 2015-12-31: qty 40

## 2015-12-31 MED ORDER — DEXTROSE 5 % IV SOLN
10.0000 mg/kg | Freq: Three times a day (TID) | INTRAVENOUS | Status: DC
  Administered 2015-12-31 – 2016-01-01 (×5): 675 mg via INTRAVENOUS
  Filled 2015-12-31 (×8): qty 13.5

## 2015-12-31 MED ORDER — SODIUM CHLORIDE 0.9 % IV SOLN
80.0000 mg | Freq: Once | INTRAVENOUS | Status: AC
  Administered 2015-12-31: 80 mg via INTRAVENOUS
  Filled 2015-12-31: qty 80

## 2015-12-31 MED ORDER — SODIUM CHLORIDE 0.9 % IV SOLN
INTRAVENOUS | Status: AC
  Administered 2015-12-31: 02:00:00 via INTRAVENOUS

## 2015-12-31 MED ORDER — HYDROCOD POLST-CPM POLST ER 10-8 MG/5ML PO SUER: 5.0000 mL | Freq: Two times a day (BID) | ORAL | Status: DC

## 2015-12-31 MED ORDER — SODIUM CHLORIDE 0.9% FLUSH
3.0000 mL | Freq: Two times a day (BID) | INTRAVENOUS | Status: DC
  Administered 2015-12-31 – 2016-01-07 (×14): 3 mL via INTRAVENOUS

## 2015-12-31 MED ORDER — IPRATROPIUM-ALBUTEROL 0.5-2.5 (3) MG/3ML IN SOLN
3.0000 mL | RESPIRATORY_TRACT | Status: DC | PRN
  Administered 2016-01-01 – 2016-01-03 (×6): 3 mL via RESPIRATORY_TRACT
  Filled 2015-12-31 (×7): qty 3

## 2015-12-31 MED ORDER — DEXTROSE 5 % IV SOLN
500.0000 mg | Freq: Every day | INTRAVENOUS | Status: DC
  Administered 2015-12-31 – 2016-01-03 (×4): 500 mg via INTRAVENOUS
  Filled 2015-12-31 (×5): qty 500

## 2015-12-31 MED ORDER — POTASSIUM CHLORIDE 10 MEQ/100ML IV SOLN
10.0000 meq | INTRAVENOUS | Status: AC
  Administered 2015-12-31 (×4): 10 meq via INTRAVENOUS
  Filled 2015-12-31 (×4): qty 100

## 2015-12-31 MED ORDER — RISPERIDONE 1 MG PO TABS
1.0000 mg | ORAL_TABLET | Freq: Two times a day (BID) | ORAL | Status: DC
  Administered 2015-12-31 – 2016-01-11 (×22): 1 mg via ORAL
  Filled 2015-12-31 (×23): qty 1

## 2015-12-31 MED ORDER — DILTIAZEM HCL ER 60 MG PO CP12
60.0000 mg | ORAL_CAPSULE | Freq: Two times a day (BID) | ORAL | Status: DC
  Administered 2015-12-31 – 2016-01-01 (×3): 60 mg via ORAL
  Filled 2015-12-31 (×4): qty 1

## 2015-12-31 MED ORDER — ONDANSETRON HCL 4 MG/2ML IJ SOLN
4.0000 mg | Freq: Four times a day (QID) | INTRAMUSCULAR | Status: DC | PRN
  Administered 2016-01-02 (×3): 4 mg via INTRAVENOUS
  Filled 2015-12-31 (×2): qty 2

## 2015-12-31 NOTE — H&P (Addendum)
[place] [facility] [place] NAME: Charlene [name]    MR#:  [id number]  DATE OF BIRTH:  02/27/1961  DATE OF ADMISSION:  12/30/2015  PRIMARY CARE PHYSICIAN: Jiles Garter, MD   REQUESTING/REFERRING PHYSICIAN: Clapacs, MD  CHIEF COMPLAINT:  No chief complaint on file.   HISTORY OF PRESENT ILLNESS:  Charlene [name]  is a 55 y.o. female who presents with Increasing lethargy and hypoxia. Patient was admitted to the behavioral health floor and hospitalists were called for admission earlier today the patient was becoming more lethargic. Initial workup ordered at that time included chest x-ray and labs and ABG which showed that she was hypoxic with a PO2 of 47. Chest x-ray question possible pneumonia. Patient became more hypotensive later on in the evening and rapid response was called, and hospitalists were called again at that point for transfer patient to the floor and admission to their service. Patient is lethargic and hence nonverbal on exam, limiting this or his ability to obtain further history of present illness information from her.  PAST MEDICAL HISTORY:   Past Medical History  Diagnosis Date  . Primary cancer of right lower lobe of lung ([place]) 07/12/2015    Small cell undifferentiated carcinoma of lung.  Diagnosis at [facility] by fine-needle aspiration of lymph node (January, 2017)  . Bipolar 2 disorder ([place])   . Borderline schizophrenia   . Falls infrequently     PAST SURGICAL HISTORY:   Past Surgical History  Procedure Laterality Date  . Abdominal hysterectomy    . Appendectomy    . Stomach surgery    . Nasal sinus surgery      x2  . Peripheral vascular catheterization N/A 07/30/2015    Procedure: Glori Luis Cath Insertion;  Surgeon: Algernon Huxley, MD;  Location: [facility];  Service: Cardiovascular;  Laterality: N/A;    SOCIAL HISTORY:   Social History  Substance Use Topics  . Smoking status: Former Smoker -- 0.50  packs/day for 30 years    Quit date: 06/17/2015  . Smokeless tobacco: Not on file  . Alcohol Use: No    FAMILY HISTORY:   Family History  Problem Relation Age of Onset  . Hypertension Father     DRUG ALLERGIES:   Allergies  Allergen Reactions  . Amoxicillin-Pot Clavulanate Hives    Also vomiting  . Septra [Sulfamethoxazole-Trimethoprim] Hives and Nausea And Vomiting    MEDICATIONS AT HOME:   Prior to Admission medications   Medication Sig Start Date End Date Taking? Authorizing Provider  albuterol (PROVENTIL HFA) 108 (90 Base) MCG/ACT inhaler  09/19/15   Historical Provider, MD  benzonatate (TESSALON PERLES) 100 MG capsule Take 100 mg by mouth. 06/21/15 06/20/16  Historical Provider, MD  cyclobenzaprine (FLEXERIL) 10 MG tablet Take 10 mg by mouth. 06/26/15   Historical Provider, MD  diclofenac (VOLTAREN) 75 MG EC tablet Take 75 mg by mouth. 06/14/15   Historical Provider, MD  Diphenhyd-Hydrocort-Nystatin (FIRST-DUKES MOUTHWASH) SUSP Take 66m by mouth, swish and swallow three times a day 08/30/15   LEvlyn Kanner NP  fluconazole (DIFLUCAN) 100 MG tablet Take 1 tablet (100 mg total) by mouth daily. 12/11/15   GNoreene Filbert MD  fluticasone (FLONASE) 50 MCG/ACT nasal spray 1 spray by Each Nare route daily. 07/03/15 07/02/16  Historical Provider, MD  lidocaine (XYLOCAINE) 5 % ointment Apply topically. 06/21/15 06/20/16  Historical Provider, MD  loratadine (CLARITIN) 10 MG tablet Take 10 mg by mouth.  Historical Provider, MD  nystatin (MYCOSTATIN) 100000 UNIT/ML suspension Take by mouth. Reported on 11/01/2015 06/21/15   Historical Provider, MD  oxyCODONE-acetaminophen (PERCOCET) 7.5-325 MG tablet Take 1 tablet by mouth every 6 (six) hours as needed for severe pain. 11/22/15   Noreene Filbert, MD  prochlorperazine (COMPAZINE) 10 MG tablet Take 1 tablet (10 mg total) by mouth every 6 (six) hours as needed for nausea or vomiting. 12/17/15   Evlyn Kanner, NP  Saline (AYR SALINE NASAL DROPS) 0.65 %  (Soln) SOLN 2 drops by Each Nare route every four (4) hours as needed. 07/03/15   Historical Provider, MD  sucralfate (CARAFATE) 1 g tablet Take 1 tablet (1 g total) by mouth 4 (four) times daily. Dissolve in 2-3 tbsp warm water, swish and swallow. 11/01/15   Forest Gleason, MD    REVIEW OF SYSTEMS:  Review of Systems  Unable to perform ROS: acuity of condition     VITAL SIGNS:  There were no vitals filed for this visit. Wt Readings from Last 3 Encounters:  12/21/15 67.586 kg (149 lb)  12/20/15 67.586 kg (149 lb)  11/08/15 67.728 kg (149 lb 5 oz)    PHYSICAL EXAMINATION:  Physical Exam  Vitals reviewed. Constitutional: She appears well-developed and well-nourished. No distress.  HENT:  Head: Normocephalic and atraumatic.  Mouth/Throat: Oropharynx is clear and moist.  Eyes: Conjunctivae and EOM are normal. Pupils are equal, round, and reactive to light. No scleral icterus.  Neck: Normal range of motion. Neck supple. No JVD present. No thyromegaly present.  Cardiovascular: Normal rate, regular rhythm and intact distal pulses.  Exam reveals no gallop and no friction rub.   No murmur heard. Respiratory: Effort normal. No respiratory distress. She has no wheezes. She has no rales.  Right basilar coarse breath sounds  GI: Soft. Bowel sounds are normal. She exhibits no distension. There is no tenderness.  Musculoskeletal: Normal range of motion. She exhibits no edema.  No arthritis, no gout  Lymphadenopathy:    She has no cervical adenopathy.  Neurological:  Unable to fully assess due to patient condition  Skin: Skin is warm and dry. No rash noted. No erythema.  Psychiatric:  Unable to assess due to patient condition    LABORATORY PANEL:   CBC  Recent Labs Lab 12/30/15 1513  WBC 9.0  HGB 8.5*  HCT 25.2*  PLT 152   ------------------------------------------------------------------------------------------------------------------  Chemistries   Recent Labs Lab  12/30/15 1513  NA 138  K 3.3*  CL 103  CO2 28  GLUCOSE 106*  BUN 14  CREATININE 0.57  CALCIUM 8.3*  AST 42*  ALT 25  ALKPHOS 66  BILITOT 0.3   ------------------------------------------------------------------------------------------------------------------  Cardiac Enzymes No results for input(s): TROPONINI in the last 168 hours. ------------------------------------------------------------------------------------------------------------------  RADIOLOGY:  Dg Chest 2 View  12/30/2015  CLINICAL DATA:  Hypoxia, stage IV brain cancer. EXAM: CHEST  2 VIEW COMPARISON:  Radiographs of December 20, 2015. FINDINGS: The heart size and mediastinal contours are within normal limits. Right internal jugular Port-A-Cath is unchanged in position. No pneumothorax is noted. Left lung is clear. Elevated right hemidiaphragm is noted with associated atelectasis or infiltrate. Some degree of pleural effusion cannot be excluded. The visualized skeletal structures are unremarkable. IMPRESSION: Interval development of elevated right hemidiaphragm with associated subsegmental atelectasis or infiltrate. Some degree of pleural effusion cannot be excluded. Electronically Signed   By: Marijo Conception, M.D.   On: 12/30/2015 14:20    EKG:   Orders placed or performed  during the hospital encounter of 12/20/15  . EKG 12-Lead  . EKG 12-Lead    IMPRESSION AND PLAN:  Principal Problem:   HCAP (healthcare-associated pneumonia) - broad spectrum antibiotics ordered, cultures ordered, lactic acid within normal limits. Active Problems:   Acute encephalopathy - unclear etiology at this time. Perhaps related to pneumonia as above, perhaps related to her psychiatric disorder.  Also some question of her cancer and the treatment she's been receiving for that. Chart review does not show much in the way of notes to elucidate treatment she's been receiving, however the cancer obviously has been getting chemotherapy therapy as  denoted by her hair loss and per nurse's report when she spoke with patient's family the patient has also been receiving brain radiation recently. We will consult oncology.   Anemia - given her falling hemoglobin this is likely due to acute blood loss, suspect GI tract. We have transfuse 1 unit of packed red blood cells and ordered GI consult.   Herpes zoster - on skin evaluation it was noted that the patient had a zoster-like dermatomal rash in her gluteal region. We will start antiviral treatment for the same in Place her on droplet precaution.   Essential (primary) hypertension - hold antihypertensives for now as the patient has had low blood pressures tonight. Fluids for blood pressure support.   Small cell carcinoma of lung [facility]) - oncology consult as above.   Bipolar I disorder, most recent episode manic, severe with psychotic features [facility]) - consult psychiatry for continued treatment as patient was initially admitted to behavioral health unit, though they may not be able to continue treatment until her medical issues have resolved some.  All the records are reviewed and case discussed with ED provider. Management plans discussed with the patient and/or family.  DVT PROPHYLAXIS: Mechanical only  GI PROPHYLAXIS: PPI  ADMISSION STATUS: Inpatient  CODE STATUS:     Code Status Orders        Start     Ordered   12/31/15 0016  Full code   Continuous     12/31/15 0020    Code Status History    Date Active Date Inactive Code Status Order ID Comments User Context   This patient has a current code status but no historical code status.    Full Code  TOTAL TIME TAKING CARE OF THIS PATIENT: 60 minutes.    [address]k [address] 12/31/2015, 12:20 AM  [facility]  Office  [phone]  CC: Primary care physician; Jiles Garter, MD

## 2015-12-31 NOTE — Progress Notes (Signed)
  [facility] Adult Case Management Discharge Plan :  Will you be returning to the same living situation after discharge:  No. Pt was discharged from [facility] and admitted and transported to the medical floor, room 116 due to symptoms of pneumonia At discharge, do you have transportation home?: Yes,  pt was transported to medical floor at [facility] Do you have the ability to pay for your medications: Yes,  pt will be provided with medications on the medical floor  Release of information consent forms completed and in the chart;  Patient's signature needed at discharge.  Patient to Follow up at: Follow-up Information    Go to [facility].   Why:  Your hospital follow up appointment is on the Monday following your discharge at the walk-in clinic for medication management, substance abuse treatment and therapy at 7:00am with Sherrian Divers. Please call Lanae Boast at [phone] prior to appointment.    Contact information:   [place] [facility] [place] [address] Ph: [phone] Fax: [phone]       Next level of care provider has access to [place] and Suicide Prevention discussed: No. Pt was unresponsive  Have you used any form of tobacco in the last 30 days? (Cigarettes, Smokeless Tobacco, Cigars, and/or Pipes): Yes  Has patient been referred to the Quitline?: Patient refused referral  Patient has been referred for addiction treatment: Yes  Charlene [name] 12/31/2015, 11:07 PM

## 2015-12-31 NOTE — Progress Notes (Signed)
[place] [facility] [place] NAME: Charlene [name]    MR#:  [id number]  DATE OF BIRTH:  1960/07/08  SUBJECTIVE:  CHIEF COMPLAINT:  No chief complaint on file.  MAXIMUM TEMPERATURE of 102. Tachycardic into the 140s. Stat dose of Cardizem given. EKG showed sinus tachycardia. Bolus 1 L normal saline given.  Finished 1 unit of packed RBC.  REVIEW OF SYSTEMS:    Review of Systems  Unable to perform ROS: mental status change    DRUG ALLERGIES:   Allergies  Allergen Reactions  . Amoxicillin-Pot Clavulanate Hives    Also vomiting  . Septra [Sulfamethoxazole-Trimethoprim] Hives and Nausea And Vomiting    VITALS:  Blood pressure 120/51, pulse 135, temperature 100.8 F (38.2 C), temperature source Oral, resp. rate 16, SpO2 100 %.  PHYSICAL EXAMINATION:   Physical Exam  GENERAL:  55 y.o.-year-old patient lying in the bed drowzy EYES: Pupils equal, round, reactive to light and accommodation. No scleral icterus. Extraocular muscles intact.  HEENT: Head atraumatic, normocephalic. Oropharynx and nasopharynx clear. Dry oral mucosa NECK:  Supple, no jugular venous distention. No thyroid enlargement, no tenderness.  LUNGS: Normal breath sounds bilaterally, no wheezing, rales, rhonchi. No use of accessory muscles of respiration. Port-A-Cath in place CARDIOVASCULAR: S1, S2 normal. No murmurs, rubs, or gallops.  ABDOMEN: Soft, nontender, nondistended. Bowel sounds present. No organomegaly or mass.  EXTREMITIES: No cyanosis, clubbing or edema b/l.    NEUROLOGIC: Moves all 4 extremities PSYCHIATRIC: The patient is drowzy  LABORATORY PANEL:   CBC  Recent Labs Lab 12/31/15 0011 12/31/15 0945  WBC 8.8  --   HGB 7.4* 9.2*  HCT 21.4* 26.8*  PLT 154  --    ------------------------------------------------------------------------------------------------------------------ Chemistries   Recent Labs Lab 12/31/15 0011  NA 139  K 3.2*  CL 103   CO2 30  GLUCOSE 87  BUN 15  CREATININE 0.56  CALCIUM 8.0*  AST 31  ALT 22  ALKPHOS 65  BILITOT 0.2*   ------------------------------------------------------------------------------------------------------------------  Cardiac Enzymes  Recent Labs Lab 12/31/15 0011  TROPONINI <0.03   ------------------------------------------------------------------------------------------------------------------  RADIOLOGY:  Dg Chest 2 View  12/30/2015  CLINICAL DATA:  Hypoxia, stage IV brain cancer. EXAM: CHEST  2 VIEW COMPARISON:  Radiographs of December 20, 2015. FINDINGS: The heart size and mediastinal contours are within normal limits. Right internal jugular Port-A-Cath is unchanged in position. No pneumothorax is noted. Left lung is clear. Elevated right hemidiaphragm is noted with associated atelectasis or infiltrate. Some degree of pleural effusion cannot be excluded. The visualized skeletal structures are unremarkable. IMPRESSION: Interval development of elevated right hemidiaphragm with associated subsegmental atelectasis or infiltrate. Some degree of pleural effusion cannot be excluded. Electronically Signed   By: Marijo Conception, M.D.   On: 12/30/2015 14:20     ASSESSMENT AND PLAN:   * HCAP (healthcare-associated pneumonia) with Acute hypoxic respiratory failure and sepsis IV abx. Await cx. Nebs PRN Wean of O2 as tolerate  * Acute encephalopathy - Likely multifactorial With hypoxia, bipolar. She seems to have had cranial radiation. We'll check a CT scan of the head.   *Anemia- Improved with 1 unit packed RBC transfused GI consult pending   *Herpes zoster - zoster-like dermatomal rash in her gluteal region.  On Acyclovir  *Essential (primary) hypertension Meds held due to hypotension  *Small cell carcinoma of lung [facility]) - oncology consult as above.  *Bipolar I disorder, most recent episode manic, severe with psychotic features [facility]) - consult psychiatry for continued  treatment as patient was initially admitted to behavioral health unit, though they may not be able to continue treatment until her medical issues have resolved some.  All the records are reviewed and case discussed with Care Management/Social Workerr. Management plans discussed with the patient, family and they are in agreement.  CODE STATUS: FULL CODE  DVT Prophylaxis: SCDs  TOTAL CC TIME TAKING CARE OF THIS PATIENT: 35 minutes.   POSSIBLE D/C IN 2-3 DAYS, DEPENDING ON CLINICAL CONDITION.  [name] R M.D on 12/31/2015 at 12:00 PM  Between 7am to 6pm - Pager - [phone]  After 6pm go to [email or web address] - password [email or web address] [facility]  Office  [phone]  CC: Primary care physician; Jiles Garter, MD  Note: This dictation was prepared with Dragon dictation along with smaller phrase technology. Any transcriptional errors that result from this process are unintentional.

## 2015-12-31 NOTE — Consult Note (Signed)
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  INTERVAL HISTORY: Patient is a 55 year old female with a history of bipolar disorder as well as history of stage III small cell lung cancer who was recently admitted to behavioral health for manic episode. She was subsequently transferred to the hospitalist service with increasing lethargy and hypoxia. She was found to be febrile with chest x-ray suggesting possible pneumonia. Currently, patient remains verbal and lethargic. She is difficult to arouse. No family is at bedside. Review of systems is unobtainable.  REVIEW OF SYSTEMS:   Review of Systems  Unable to perform ROS: medical condition    PAST MEDICAL HISTORY: Past Medical History  Diagnosis Date  . Primary cancer of right lower lobe of lung ([place]) 07/12/2015    Small cell undifferentiated carcinoma of lung.  Diagnosis at [facility] by fine-needle aspiration of lymph node (January, 2017)  . Bipolar 2 disorder ([place])   . Borderline schizophrenia   . Falls infrequently     PAST SURGICAL HISTORY: Past Surgical History  Procedure Laterality Date  . Abdominal hysterectomy    . Appendectomy    . Stomach surgery    . Nasal sinus surgery      x2  . Peripheral vascular catheterization N/A 07/30/2015    Procedure: Glori Luis Cath Insertion;  Surgeon: Algernon Huxley, MD;  Location: [facility];  Service: Cardiovascular;  Laterality: N/A;    FAMILY HISTORY: Family History  Problem Relation Age of Onset  . Hypertension Father        ADVANCED DIRECTIVES:    HEALTH MAINTENANCE: Social History  Substance Use Topics  . Smoking status: Former Smoker -- 0.50  packs/day for 30 years    Quit date: 06/17/2015  . Smokeless tobacco: None  . Alcohol Use: No     Colonoscopy:  PAP:  Bone density:  Lipid panel:  Allergies  Allergen Reactions  . Amoxicillin-Pot Clavulanate Hives    Also vomiting  . Septra [Sulfamethoxazole-Trimethoprim] Hives and Nausea And Vomiting    Current Facility-Administered Medications  Medication Dose Route Frequency Provider Last Rate Last Dose  . 0.9 %  sodium chloride infusion   Intravenous Once Lance Coon, MD      . 0.9 %  sodium chloride infusion   Intravenous Continuous [name], MD 100 mL/hr at 12/31/15 1243    . acetaminophen (TYLENOL) tablet 650 mg  650 mg Oral Q6H PRN Lance Coon, MD   650 mg at 12/31/15 1009   Or  . acetaminophen (TYLENOL) suppository 650 mg  650 mg Rectal Q6H PRN Lance Coon, MD      . acyclovir (ZOVIRAX) 675 mg in dextrose 5 % 100 mL IVPB  10 mg/kg Intravenous Q8H Lance Coon, MD   675 mg at 12/31/15 2206  . azithromycin (ZITHROMAX) 500 mg in dextrose 5 % 250 mL IVPB  500 mg Intravenous q1800 [name], MD   500 mg at 12/31/15 1851  . chlorpheniramine-HYDROcodone (TUSSIONEX) 10-8 MG/5ML suspension 5 mL  5 mL Oral Q12H Srikar Sudini, MD   5 mL at 12/31/15 1645  . diltiazem (CARDIZEM SR) 12 hr capsule 60 mg  60 mg Oral Q12H [name], MD   60 mg  at 12/31/15 2207  . divalproex (DEPAKOTE) DR tablet 500 mg  500 mg Oral Q8H Gonzella Lex, MD   500 mg at 12/31/15 2207  . HYDROcodone-acetaminophen (NORCO) 7.5-325 MG per tablet 1 tablet  1 tablet Oral Q6H PRN [name], MD   1 tablet at 12/31/15 1645  . ipratropium-albuterol (DUONEB) 0.5-2.5 (3) MG/3ML nebulizer solution 3 mL  3 mL Nebulization Q4H PRN Srikar Sudini, MD      . meropenem (MERREM) 1 g in sodium chloride 0.9 % 100 mL IVPB  1 g Intravenous Q8H Lance Coon, MD   1 g at 12/31/15 2022  . ondansetron (ZOFRAN) tablet 4 mg  4 mg Oral Q6H PRN Lance Coon, MD       Or  . ondansetron [facility]) injection 4 mg  4 mg  Intravenous Q6H PRN Lance Coon, MD      . pantoprazole (PROTONIX) 80 mg in sodium chloride 0.9 % 250 mL (0.32 mg/mL) infusion  8 mg/hr Intravenous Continuous Lance Coon, MD 25 mL/hr at 12/31/15 2332 8 mg/hr at 12/31/15 2332  . [START ON 01/03/2016] pantoprazole (PROTONIX) injection 40 mg  40 mg Intravenous Q12H Lance Coon, MD      . potassium chloride 10 mEq in 100 mL IVPB  10 mEq Intravenous Q1 Hr x 4 Srikar Sudini, MD   10 mEq at 12/31/15 2331  . risperiDONE (RISPERDAL) tablet 1 mg  1 mg Oral BID Gonzella Lex, MD   1 mg at 12/31/15 2207  . sodium chloride 0.9 % bolus 1,000 mL  1,000 mL Intravenous PRN Lance Coon, MD      . sodium chloride flush (NS) 0.9 % injection 3 mL  3 mL Intravenous Q12H Lance Coon, MD   3 mL at 12/31/15 1009  . vancomycin (VANCOCIN) IVPB 1000 mg/200 mL premix  1,000 mg Intravenous Q12H Lance Coon, MD   1,000 mg at 12/31/15 2020    OBJECTIVE: Filed Vitals:   12/31/15 1839 12/31/15 2109  BP: 107/63 110/77  Pulse: 91 109  Temp: 98.4 F (36.9 C) 98.2 F (36.8 C)  Resp: 20 18     There is no weight on file to calculate BMI.    ECOG FS:3 - Symptomatic, >50% confined to bed  General: Ill-appearing, no acute distress. Eyes: Pink conjunctiva, anicteric sclera. HEENT: Normocephalic, moist mucous membranes, clear oropharnyx. Lungs: Clear to auscultation bilaterally. Heart: Regular rate and rhythm. No rubs, murmurs, or gallops. Abdomen: Soft, nontender, nondistended. No organomegaly noted, normoactive bowel sounds. Musculoskeletal: No edema, cyanosis, or clubbing. Neuro: Alert, answering all questions appropriately. Cranial nerves grossly intact. Skin: No rashes or petechiae noted. Psych: Normal affect.   LAB RESULTS:  Lab Results  Component Value Date   NA 139 12/31/2015   K 3.2* 12/31/2015   CL 103 12/31/2015   CO2 30 12/31/2015   GLUCOSE 87 12/31/2015   BUN 15 12/31/2015   CREATININE 0.56 12/31/2015   CALCIUM 8.0* 12/31/2015   PROT 5.3*  12/31/2015   ALBUMIN 2.1* 12/31/2015   AST 31 12/31/2015   ALT 22 12/31/2015   ALKPHOS 65 12/31/2015   BILITOT 0.2* 12/31/2015   GFRNONAA >60 12/31/2015   GFRAA >60 12/31/2015    Lab Results  Component Value Date   WBC 8.8 12/31/2015   NEUTROABS 7.7* 12/30/2015   HGB 8.9* 12/31/2015   HCT 26.8* 12/31/2015   MCV 98.1 12/31/2015   PLT 154 12/31/2015     STUDIES: Dg Chest 2 View  12/30/2015  CLINICAL DATA:  Hypoxia, stage IV brain cancer. EXAM: CHEST  2 VIEW COMPARISON:  Radiographs of December 20, 2015. FINDINGS: The heart size and mediastinal contours are within normal limits. Right internal jugular Port-A-Cath is unchanged in position. No pneumothorax is noted. Left lung is clear. Elevated right hemidiaphragm is noted with associated atelectasis or infiltrate. Some degree of pleural effusion cannot be excluded. The visualized skeletal structures are unremarkable. IMPRESSION: Interval development of elevated right hemidiaphragm with associated subsegmental atelectasis or infiltrate. Some degree of pleural effusion cannot be excluded. Electronically Signed   By: Marijo Conception, M.D.   On: 12/30/2015 14:20   Dg Chest 2 View  12/20/2015  CLINICAL DATA:  [place] this morning with left chest and upper back pain. EXAM: CHEST  2 VIEW COMPARISON:  10/29/2015 CT FINDINGS: Heart size is normal. Mediastinal shadows are normal. Lungs are clear. No effusions. Power port on the right has its tip in the SVC 3 cm above the right atrium. No pneumothorax or hemothorax. No visible rib fracture. IMPRESSION: No acute or traumatic finding.  Power port on the right. Electronically Signed   By: Nelson Chimes M.D.   On: 12/20/2015 23:34   Ct Head Wo Contrast  12/31/2015  CLINICAL DATA:  Altered level of consciousness. EXAM: CT HEAD WITHOUT CONTRAST TECHNIQUE: Contiguous axial images were obtained from the base of the skull through the vertex without intravenous contrast. COMPARISON:  CT scan of July 16, 2015. FINDINGS:  Bilateral maxillary sinusitis is noted. Bony calvarium appears intact. No mass effect or midline shift is noted. Ventricular size is within normal limits. There is no evidence of mass lesion, hemorrhage or acute infarction. Mild chronic ischemic white matter disease is noted. IMPRESSION: Bilateral maxillary sinusitis. Mild chronic ischemic white matter disease. No acute intracranial abnormality seen. Electronically Signed   By: Marijo Conception, M.D.   On: 12/31/2015 15:09   Dg Chest Port 1 View  12/31/2015  CLINICAL DATA:  PICC line placement EXAM: PORTABLE CHEST 1 VIEW COMPARISON:  12/30/2015 FINDINGS: Left arm PICC terminates at the cavoatrial junction. Right chest port terminates at cavoatrial junction. Moderate right pleural effusion. Associated right lower lobe opacity, likely atelectasis. Left lung is clear. No pneumothorax. Heart is top-normal in size. IMPRESSION: Left arm PICC terminates at the cavoatrial junction. Otherwise unchanged. Electronically Signed   By: Julian Hy M.D.   On: 12/31/2015 18:37    ASSESSMENT: Stage III small cell lung cancer, anemia, bipolar disorder, pneumonia.  PLAN:    1. Stage III small cell lung cancer: Patient completed her chemotherapy and XRT on October 03, 2015. CT scan at the completion of therapy revealed an excellent response to treatment. Patient also has completed prophylactic cranial radiation. No further intervention is needed. Plan is to repeat CT scan in August 2017 with follow-up in the [place] soon after. No further intervention is needed. 2. Anemia: Likely multifactorial. Appreciate GI input. Patient is received one unit of packed red blood cells. Continue to monitor. Patient does not need an additional transfusion at this time. 3. Bipolar disorder: Continue treatment per psychiatry. 4. Sepsis/pneumonia: Continue current antibiotics.  Appreciate consult. I will be out of the hospital until Monday, January 07, 2016. Please call the on-call  physician if you have any questions or concerns.   Lloyd Huger, MD   12/31/2015 11:34 PM

## 2015-12-31 NOTE — Care Management Note (Addendum)
Case Management Note  Patient Details  Name: Charlene [name] MRN: [id number] Date of Birth: May 25, 1961  Subjective/Objective:    55yo Charlene [name] was admiiited from home to the [facility] unit on IVC  12/21/15 per AMS, Bipolar DO. Hx: Bipolar DO, lung cancer, COPD. Followed by the [facility] where she has received chemo and radiation treatment. On 12/30/15 a Rapid Response Code was called per Charlene [place] was found with decreased responsiveness in [place]. Transferred to 1-C with diagnosis of PNA and anemia with falling Hgb. On 12/31/15 Hgb was 8.5 . On 12/31/15 Hgb is 7.4. UA=UTI.  [facility]-SW are working to determine a discharge disposition, either home with father Charlene [name] or to an ALF.                   Action/Plan:   Expected Discharge Date:                  Expected Discharge Plan:     In-House Referral:     Discharge planning Services     Post Acute Care Choice:    Choice offered to:     DME Arranged:    DME Agency:     HH Arranged:    HH Agency:     Status of Service:     If discussed at [employer] of Stay Meetings, dates discussed:    Additional Comments:  Carla Rashad A, RN 12/31/2015, 7:47 AM

## 2015-12-31 NOTE — Consult Note (Signed)
Charlene Lame, MD Charlene [name]., [name],  [address] Phone: [phone] Fax : [phone]  Consultation  Referring Provider:     No ref. provider found Primary Care Physician:  Jiles Garter, MD Primary Gastroenterologist:           Reason for Consultation:     Anemia  Date of Admission:  12/30/2015 Date of Consultation:  12/31/2015         HPI:   Charlene [name] is a 55 y.o. female who was admitted to the floor with increasing lethargy and hypoxia. The patient has been diagnosed with small cell lung cancer. The patient also has a bipolar disorder. The patient was found to have a low blood count and a GI consult was called. The patient's MCV was normal and the patient's stools have not been sent off for Hemoccult yet. The patient's father reports that the patient had a patient's father was called and he reports that the patient had a history many years ago of having ulcers. The patient herself is not able to give much history. She denies any abdominal pain.  Past Medical History  Diagnosis Date  . Primary cancer of right lower lobe of lung ([place]) 07/12/2015    Small cell undifferentiated carcinoma of lung.  Diagnosis at [facility] by fine-needle aspiration of lymph node (January, 2017)  . Bipolar 2 disorder ([place])   . Borderline schizophrenia   . Falls infrequently     Past Surgical History  Procedure Laterality Date  . Abdominal hysterectomy    . Appendectomy    . Stomach surgery    . Nasal sinus surgery      x2  . Peripheral vascular catheterization N/A 07/30/2015    Procedure: Glori Luis Cath Insertion;  Surgeon: Algernon Huxley, MD;  Location: [facility];  Service: Cardiovascular;  Laterality: N/A;    Prior to Admission medications   Medication Sig Start Date End Date Taking? Authorizing Provider  albuterol (PROVENTIL HFA) 108 (90 Base) MCG/ACT inhaler  09/19/15   Historical Provider, MD  benzonatate (TESSALON PERLES) 100 MG capsule Take 100 mg by  mouth. 06/21/15 06/20/16  Historical Provider, MD  cyclobenzaprine (FLEXERIL) 10 MG tablet Take 10 mg by mouth. 06/26/15   Historical Provider, MD  diclofenac (VOLTAREN) 75 MG EC tablet Take 75 mg by mouth. 06/14/15   Historical Provider, MD  Diphenhyd-Hydrocort-Nystatin (FIRST-DUKES MOUTHWASH) SUSP Take 87m by mouth, swish and swallow three times a day 08/30/15   LEvlyn Kanner NP  fluconazole (DIFLUCAN) 100 MG tablet Take 1 tablet (100 mg total) by mouth daily. 12/11/15   GNoreene Filbert MD  fluticasone (FLONASE) 50 MCG/ACT nasal spray 1 spray by Each Nare route daily. 07/03/15 07/02/16  Historical Provider, MD  lidocaine (XYLOCAINE) 5 % ointment Apply topically. 06/21/15 06/20/16  Historical Provider, MD  loratadine (CLARITIN) 10 MG tablet Take 10 mg by mouth.    Historical Provider, MD  nystatin (MYCOSTATIN) 100000 UNIT/ML suspension Take by mouth. Reported on 11/01/2015 06/21/15   Historical Provider, MD  oxyCODONE-acetaminophen (PERCOCET) 7.5-325 MG tablet Take 1 tablet by mouth every 6 (six) hours as needed for severe pain. 11/22/15   GNoreene Filbert MD  prochlorperazine (COMPAZINE) 10 MG tablet Take 1 tablet (10 mg total) by mouth every 6 (six) hours as needed for nausea or vomiting. 12/17/15   LEvlyn Kanner NP  Saline (AYR SALINE NASAL DROPS) 0.65 % (Soln) SOLN 2 drops by Each Nare route every four (4) hours  as needed. 07/03/15   Historical Provider, MD  sucralfate (CARAFATE) 1 g tablet Take 1 tablet (1 g total) by mouth 4 (four) times daily. Dissolve in 2-3 tbsp warm water, swish and swallow. 11/01/15   Forest Gleason, MD    Family History  Problem Relation Age of Onset  . Hypertension Father      Social History  Substance Use Topics  . Smoking status: Former Smoker -- 0.50 packs/day for 30 years    Quit date: 06/17/2015  . Smokeless tobacco: None  . Alcohol Use: No    Allergies as of 12/30/2015 - Review Complete 12/21/2015  Allergen Reaction Noted  . Amoxicillin-pot clavulanate Hives  07/12/2015  . Septra [sulfamethoxazole-trimethoprim] Hives and Nausea And Vomiting 12/20/2015    Review of Systems:    All systems reviewed and negative except where noted in HPI.   Physical Exam:  Vital signs in last 24 hours: Temp:  [97.5 F (36.4 C)-101.4 F (38.6 C)] 100.8 F (38.2 C) (07/17 1144) Pulse Rate:  [96-135] 135 (07/17 0918) Resp:  [16-18] 16 (07/17 0918) BP: (105-122)/(51-73) 120/51 mmHg (07/17 0918) SpO2:  [91 %-100 %] 100 % (07/17 0918)   General:   Pleasant, cooperative in Respiratory distress Head:  Normocephalic and atraumatic. Eyes:   No icterus.   Conjunctiva pink. PERRLA. Ears:  Normal auditory acuity. Neck:  Supple; no masses or thyroidomegaly Lungs: Respirations even and unlabored. Lungs clear to auscultation bilaterally.   No wheezes, crackles, or rhonchi.  Heart:  Regular rate and rhythm;  Without murmur, clicks, rubs or gallops Abdomen:  Soft, nondistended, nontender. Normal bowel sounds. No appreciable masses or hepatomegaly.  No rebound or guarding.  Rectal:  Not performed. Msk:  Symmetrical without gross deformities.    Extremities:  Without edema, cyanosis or clubbing. Neurologic:  Lethargic; unable to perform a neurological exam Skin:  Intact without significant lesions or rashes. Cervical Nodes:  No significant cervical adenopathy. Psych:  Lethargic bipolar  LAB RESULTS:  Recent Labs  12/30/15 1513 12/31/15 0011 12/31/15 0945  WBC 9.0 8.8  --   HGB 8.5* 7.4* 9.2*  HCT 25.2* 21.4* 26.8*  PLT 152 154  --    BMET  Recent Labs  12/30/15 1513 12/31/15 0011  NA 138 139  K 3.3* 3.2*  CL 103 103  CO2 28 30  GLUCOSE 106* 87  BUN 14 15  CREATININE 0.57 0.56  CALCIUM 8.3* 8.0*   LFT  Recent Labs  12/31/15 0011  PROT 5.3*  ALBUMIN 2.1*  AST 31  ALT 22  ALKPHOS 65  BILITOT 0.2*   PT/INR No results for input(s): LABPROT, INR in the last 72 hours.  STUDIES: Dg Chest 2 View  12/30/2015  CLINICAL DATA:  Hypoxia, stage IV  brain cancer. EXAM: CHEST  2 VIEW COMPARISON:  Radiographs of December 20, 2015. FINDINGS: The heart size and mediastinal contours are within normal limits. Right internal jugular Port-A-Cath is unchanged in position. No pneumothorax is noted. Left lung is clear. Elevated right hemidiaphragm is noted with associated atelectasis or infiltrate. Some degree of pleural effusion cannot be excluded. The visualized skeletal structures are unremarkable. IMPRESSION: Interval development of elevated right hemidiaphragm with associated subsegmental atelectasis or infiltrate. Some degree of pleural effusion cannot be excluded. Electronically Signed   By: Marijo Conception, M.D.   On: 12/30/2015 14:20   Ct Head Wo Contrast  12/31/2015  CLINICAL DATA:  Altered level of consciousness. EXAM: CT HEAD WITHOUT CONTRAST TECHNIQUE: Contiguous axial images were obtained  from the base of the skull through the vertex without intravenous contrast. COMPARISON:  CT scan of July 16, 2015. FINDINGS: Bilateral maxillary sinusitis is noted. Bony calvarium appears intact. No mass effect or midline shift is noted. Ventricular size is within normal limits. There is no evidence of mass lesion, hemorrhage or acute infarction. Mild chronic ischemic white matter disease is noted. IMPRESSION: Bilateral maxillary sinusitis. Mild chronic ischemic white matter disease. No acute intracranial abnormality seen. Electronically Signed   By: Marijo Conception, M.D.   On: 12/31/2015 15:09      Impression / Plan:   Charlene [name] is a 55 y.o. y/o female with A history of lung cancer who was found to have anemia with increasing lethargy. The patient's respiratory compromise makes it very dangerous to proceed with any GI intervention at this time. I have discussed with the patient's father who is her caregiver about doing any endoscopies on the patient at this time and he agrees that it is too risky. I will continue to monitor the patient with you and if the  stools come back as heme positive or any sign of a GI source of bleed, then the need of a possible endoscopic intervention will be revisited.  Thank you for involving me in the care of this patient.      LOS: 1 day   Charlene Lame, MD  12/31/2015, 4:57 PM   Note: This dictation was prepared with Dragon dictation along with smaller phrase technology. Any transcriptional errors that result from this process are unintentional.

## 2015-12-31 NOTE — Progress Notes (Signed)
Received telephone consent from patients mother for blood transfusion along with phyllis king rn mother states that she has had blood before and that helps sometimes. I also told her we had transferred the patient to [facility] due to shingles she thanked me and said they would be in later today.

## 2015-12-31 NOTE — Consult Note (Signed)
[facility] Consult   Reason for Consult:  Follow-up consult for this 55 year old woman with a history of bipolar disorder who was transferred from the psychiatric ward to medical service last night Referring Physician:  Sudini Patient Identification: WAVE CALZADA MRN:  [id number] Principal Diagnosis: HCAP (healthcare-associated pneumonia) Diagnosis:   Patient Active Problem List   Diagnosis Date Noted  . HCAP (healthcare-associated pneumonia) [J18.9] 12/31/2015  . Acute encephalopathy [G93.40] 12/31/2015  . Herpes zoster [B02.9] 12/31/2015  . Anemia [D64.9] 12/31/2015  . Acute psychosis associated with endocrine, metabolic, or cerebrovascular disorder [F06.8, F01.50] 12/21/2015  . Cocaine use disorder, moderate, dependence ([place]) [F14.20] 12/21/2015  . Bipolar I disorder, most recent episode manic, severe with psychotic features ([place]) [F31.2] 12/21/2015  . OCD (obsessive compulsive disorder) [F42.9] 12/21/2015  . PTSD (post-traumatic stress disorder) [F43.10] 12/21/2015  . Abnormal cells of cervix [N87.9] 07/30/2015  . Encounter for general adult medical examination without abnormal findings [Z00.00] 07/28/2015  . Primary cancer of right lower lobe of lung ([place]) [C34.31] 07/12/2015  . At risk for noncompliance [Z87.898] 06/29/2015  . Spasm [R25.2] 06/26/2015  . Essential (primary) hypertension [I10] 06/21/2015  . Lung mass [R91.8] 06/21/2015  . Candida infection of mouth [B37.0] 06/21/2015  . 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 Episodic mood disorder ([place]) [F39] 10/14/2014  . Chronic obstructive pulmonary disease ([place]) [J44.9] 10/06/2014  . Pulmonary emphysema ([place]) [J43.9] 10/06/2014  . LBP (low back pain) [M54.5] 09/27/2014  . Tobacco use disorder [F17.200] 09/27/2014    Total Time spent with patient: 45 minutes  Subjective:   Jolana L Siler is a 55 y.o. female patient admitted with "I'm feeling better".  HPI:  55 year old woman with  bipolar disorder and lung cancer. She had been admitted to the hospital psychiatrically recently for a manic episode. At the time of admission she was described as being hyperverbal hyperactive grandiose and delusional. She was being treated with antipsychotic and mood stabilizing medicine on the psychiatric ward. Had been hyperactive and agitated up until a couple days ago and then this weekend became sedated. Yesterday became extremely lethargic. Oxygen level dropped. Last night after a rapid response was called she was transferred to the medical service. On interview today the patient says she is feeling a little better. She no she is in the hospital and she is able to give me at least a vague answer indicating she know she was on the psychiatric ward. She says her mood is feeling okay. Denies hallucinations. Denies suicidal thoughts. She is still a little sluggish and out of bed and not able to answer in much more detail than that. Much more active however than she was yesterday.  Past Psychiatric History: Long history of bipolar disorder multiple prior hospitalizations for mania.  Risk to Self:  past history of suicide attempts and agitation and poor self-care Risk to Others:  not threatening Prior Inpatient Therapy:  history of multiple prior inpatient treatments Prior Outpatient Therapy:  on history of bipolar disorder  Past Medical History:  Past Medical History  Diagnosis Date  . Primary cancer of right lower lobe of lung ([place]) 07/12/2015    Small cell undifferentiated carcinoma of lung.  Diagnosis at [facility] by fine-needle aspiration of lymph node (January, 2017)  . Bipolar 2 disorder ([place])   . Borderline schizophrenia   . Falls infrequently     Past Surgical History  Procedure Laterality Date  . Abdominal hysterectomy    . Appendectomy    .  Stomach surgery    . Nasal sinus surgery      x2  . Peripheral vascular catheterization N/A 07/30/2015    Procedure: Shelda Pal Cath  Insertion;  Surgeon: Annice Needy, MD;  Location: [facility] INVASIVE CV LAB;  Service: Cardiovascular;  Laterality: N/A;   Family History:  Family History  Problem Relation Age of Onset  . Hypertension Father    Family Psychiatric  History: Patient denies being aware of any Social History:  History  Alcohol Use No     History  Drug Use  . Yes  . Special: Cocaine    Comment: States due to cocaine in mouthwash    Social History   Social History  . Marital Status: Divorced    Spouse Name: N/A  . Number of Children: N/A  . Years of Education: N/A   Social History Main Topics  . Smoking status: Former Smoker -- 0.50 packs/day for 30 years    Quit date: 06/17/2015  . Smokeless tobacco: None  . Alcohol Use: No  . Drug Use: Yes    Special: Cocaine     Comment: States due to cocaine in mouthwash  . Sexual Activity: Not Asked   Other Topics Concern  . None   Social History Narrative   Additional Social History:    Allergies:   Allergies  Allergen Reactions  . Amoxicillin-Pot Clavulanate Hives    Also vomiting  . Septra [Sulfamethoxazole-Trimethoprim] Hives and Nausea And Vomiting    Labs:  Results for orders placed or performed during the hospital encounter of 12/30/15 (from the past 48 hour(s))  Comprehensive metabolic panel     Status: Abnormal   Collection Time: 12/31/15 12:11 AM  Result Value Ref Range   Sodium 139 135 - 145 mmol/L   Potassium 3.2 (L) 3.5 - 5.1 mmol/L   Chloride 103 101 - 111 mmol/L   CO2 30 22 - 32 mmol/L   Glucose, Bld 87 65 - 99 mg/dL   BUN 15 6 - 20 mg/dL   Creatinine, Ser 9.67 0.44 - 1.00 mg/dL   Calcium 8.0 (L) 8.9 - 10.3 mg/dL   Total Protein 5.3 (L) 6.5 - 8.1 g/dL   Albumin 2.1 (L) 3.5 - 5.0 g/dL   AST 31 15 - 41 U/L   ALT 22 14 - 54 U/L   Alkaline Phosphatase 65 38 - 126 U/L   Total Bilirubin 0.2 (L) 0.3 - 1.2 mg/dL   GFR calc non Af Amer >60 >60 mL/min   GFR calc Af Amer >60 >60 mL/min    Comment: (NOTE) The eGFR has been  calculated using the CKD EPI equation. This calculation has not been validated in all clinical situations. eGFR's persistently <60 mL/min signify possible Chronic Kidney Disease.    Anion gap 6 5 - 15  CBC     Status: Abnormal   Collection Time: 12/31/15 12:11 AM  Result Value Ref Range   WBC 8.8 3.6 - 11.0 K/uL   RBC 2.18 (L) 3.80 - 5.20 MIL/uL   Hemoglobin 7.4 (L) 12.0 - 16.0 g/dL   HCT 22.7 (L) 73.7 - 50.5 %   MCV 98.1 80.0 - 100.0 fL   MCH 33.8 26.0 - 34.0 pg   MCHC 34.4 32.0 - 36.0 g/dL   RDW 10.7 (H) 12.5 - 24.7 %   Platelets 154 150 - 440 K/uL  Troponin I     Status: None   Collection Time: 12/31/15 12:11 AM  Result Value Ref Range   Troponin  I <0.03 <0.03 ng/mL  Lactic acid, plasma     Status: None   Collection Time: 12/31/15 12:11 AM  Result Value Ref Range   Lactic Acid, Venous 0.7 0.5 - 1.9 mmol/L  Blood gas, arterial     Status: Abnormal   Collection Time: 12/31/15 12:19 AM  Result Value Ref Range   FIO2 1.00    Delivery systems NON-REBREATHER OXYGEN MASK    Inspiratory PAP PENDING    Expiratory PAP PENDING    pH, Arterial 7.39 7.350 - 7.450   pCO2 arterial 54 (H) 32.0 - 48.0 mmHg   pO2, Arterial 160 (H) 83.0 - 108.0 mmHg   Bicarbonate 32.7 (H) 21.0 - 28.0 mEq/L   Acid-Base Excess 5.9 (H) 0.0 - 3.0 mmol/L   O2 Saturation 99.4 %   Patient temperature 37.0    Oxygen index PENDING    Collection site RIGHT RADIAL    Sample type ARTERIAL DRAW    Allens test (pass/fail) PASS PASS  Type and screen [facility]     Status: None (Preliminary result)   Collection Time: 12/31/15  1:29 AM  Result Value Ref Range   ABO/RH(D) O POS    Antibody Screen NEG    Sample Expiration 01/03/2016    Unit Number [id number]    Blood Component Type RED CELLS,LR    Unit division 00    Status of Unit ISSUED    Transfusion Status OK TO TRANSFUSE    Crossmatch Result Compatible   Prepare RBC     Status: None   Collection Time: 12/31/15  1:29 AM  Result  Value Ref Range   Order Confirmation ORDER PROCESSED BY BLOOD BANK   ABO/Rh     Status: None   Collection Time: 12/31/15  1:29 AM  Result Value Ref Range   ABO/RH(D) O POS   Urinalysis complete, with microscopic ([facility] only)     Status: Abnormal   Collection Time: 12/31/15  4:20 AM  Result Value Ref Range   Color, Urine YELLOW (A) YELLOW   APPearance HAZY (A) CLEAR   Glucose, UA NEGATIVE NEGATIVE mg/dL   Bilirubin Urine NEGATIVE NEGATIVE   Ketones, ur TRACE (A) NEGATIVE mg/dL   Specific Gravity, Urine 1.019 1.005 - 1.030   Hgb urine dipstick 1+ (A) NEGATIVE   pH 5.0 5.0 - 8.0   Protein, ur 30 (A) NEGATIVE mg/dL   Nitrite NEGATIVE NEGATIVE   Leukocytes, UA 3+ (A) NEGATIVE   RBC / HPF TOO NUMEROUS TO COUNT 0 - 5 RBC/hpf   WBC, UA TOO NUMEROUS TO COUNT 0 - 5 WBC/hpf   Bacteria, UA RARE (A) NONE SEEN   Squamous Epithelial / LPF 0-5 (A) NONE SEEN   Mucous PRESENT    Hyaline Casts, UA PRESENT   Lactic acid, plasma     Status: None   Collection Time: 12/31/15  9:45 AM  Result Value Ref Range   Lactic Acid, Venous 0.6 0.5 - 1.9 mmol/L  Hemoglobin and hematocrit, blood     Status: Abnormal   Collection Time: 12/31/15  9:45 AM  Result Value Ref Range   Hemoglobin 9.2 (L) 12.0 - 16.0 g/dL   HCT 50.8 (L) 13.6 - 53.1 %    Current Facility-Administered Medications  Medication Dose Route Frequency Provider Last Rate Last Dose  . 0.9 %  sodium chloride infusion   Intravenous Once Oralia Manis, MD      . 0.9 %  sodium chloride infusion   Intravenous Continuous Srikar Sudini,  MD 100 mL/hr at 12/31/15 1243    . acetaminophen (TYLENOL) tablet 650 mg  650 mg Oral Q6H PRN Lance Coon, MD   650 mg at 12/31/15 1009   Or  . acetaminophen (TYLENOL) suppository 650 mg  650 mg Rectal Q6H PRN Lance Coon, MD      . acyclovir (ZOVIRAX) 675 mg in dextrose 5 % 100 mL IVPB  10 mg/kg Intravenous Q8H Lance Coon, MD   675 mg at 12/31/15 1028  . azithromycin (ZITHROMAX) 500 mg in dextrose 5 % 250 mL IVPB   500 mg Intravenous q1800 Srikar Sudini, MD      . chlorpheniramine-HYDROcodone (TUSSIONEX) 10-8 MG/5ML suspension 5 mL  5 mL Oral Q12H Srikar Sudini, MD   5 mL at 12/31/15 1645  . diltiazem (CARDIZEM SR) 12 hr capsule 60 mg  60 mg Oral Q12H [name], MD   60 mg at 12/31/15 0921  . divalproex (DEPAKOTE) DR tablet 500 mg  500 mg Oral Q8H Gonzella Lex, MD      . HYDROcodone-acetaminophen (NORCO) 7.5-325 MG per tablet 1 tablet  1 tablet Oral Q6H PRN [name], MD   1 tablet at 12/31/15 1645  . ipratropium-albuterol (DUONEB) 0.5-2.5 (3) MG/3ML nebulizer solution 3 mL  3 mL Nebulization Q4H PRN Srikar Sudini, MD      . meropenem (MERREM) 1 g in sodium chloride 0.9 % 100 mL IVPB  1 g Intravenous Q8H Lance Coon, MD   1 g at 12/31/15 1244  . ondansetron (ZOFRAN) tablet 4 mg  4 mg Oral Q6H PRN Lance Coon, MD       Or  . ondansetron [facility]) injection 4 mg  4 mg Intravenous Q6H PRN Lance Coon, MD      . pantoprazole (PROTONIX) 80 mg in sodium chloride 0.9 % 250 mL (0.32 mg/mL) infusion  8 mg/hr Intravenous Continuous Lance Coon, MD 25 mL/hr at 12/31/15 1243 8 mg/hr at 12/31/15 1243  . [START ON 01/03/2016] pantoprazole (PROTONIX) injection 40 mg  40 mg Intravenous Q12H Lance Coon, MD      . potassium chloride 10 mEq in 100 mL IVPB  10 mEq Intravenous Q1 Hr x 4 Srikar Sudini, MD      . risperiDONE (RISPERDAL) tablet 1 mg  1 mg Oral BID Gonzella Lex, MD      . sodium chloride 0.9 % bolus 1,000 mL  1,000 mL Intravenous PRN Lance Coon, MD      . sodium chloride flush (NS) 0.9 % injection 3 mL  3 mL Intravenous Q12H Lance Coon, MD   3 mL at 12/31/15 1009  . vancomycin (VANCOCIN) IVPB 1000 mg/200 mL premix  1,000 mg Intravenous Q12H Lance Coon, MD   1,000 mg at 12/31/15 0930    Musculoskeletal: Strength & Muscle Tone: decreased Gait & Station: unable to stand Patient leans: N/A  Psychiatric Specialty Exam: Physical Exam  Nursing note and vitals reviewed. HENT:  Head:  Normocephalic and atraumatic.  Eyes: Conjunctivae are normal. Pupils are equal, round, and reactive to light.  Neck: Normal range of motion.  Cardiovascular: Regular rhythm and normal heart sounds.   Respiratory: She is in respiratory distress.  GI: Soft.  Musculoskeletal: Normal range of motion.  Neurological: She is alert.  Skin: Skin is warm and dry.     Psychiatric: Her mood appears anxious. Her speech is delayed. She is slowed. Cognition and memory are impaired. She expresses impulsivity.    Review of Systems  Constitutional: Negative.  HENT: Negative.   Eyes: Negative.   Respiratory: Positive for cough, sputum production and shortness of breath.   Cardiovascular: Negative.   Gastrointestinal: Negative.   Musculoskeletal: Negative.   Skin: Negative.   Neurological: Negative.   Psychiatric/Behavioral: Positive for memory loss. Negative for depression, suicidal ideas, hallucinations and substance abuse. The patient is nervous/anxious and has insomnia.     Blood pressure 120/51, pulse 135, temperature 100.8 F (38.2 C), temperature source Oral, resp. rate 16, SpO2 100 %.There is no weight on file to calculate BMI.  General Appearance: Disheveled  Eye Contact:  Minimal  Speech:  Garbled and Slow  Volume:  Decreased  Mood:  Euthymic  Affect:  Constricted  Thought Process:  Coherent  Orientation:  Full (Time, Place, and Person)  Thought Content:  Logical  Suicidal Thoughts:  No  Homicidal Thoughts:  No  Memory:  Immediate;   Fair Recent;   Fair Remote;   Fair  Judgement:  Fair  Insight:  Fair  Psychomotor Activity:  Decreased  Concentration:  Concentration: Fair  Recall:  [employer] of Knowledge:  Fair  Language:  Fair  Akathisia:  No  Handed:  Right  AIMS (if indicated):     Assets:  Financial Resources/Insurance Resilience Social Support  ADL's:  Impaired  Cognition:  Impaired,  Mild  Sleep:        Treatment Plan Summary: Daily contact with patient to  assess and evaluate symptoms and progress in treatment, Medication management and Plan Patient with bipolar disorder and now pneumonia. Treatment recently for lung cancer. Patient on interview today he is more awake alert and interactive than I saw her all weekend but still clearly sick. She has not been given her psychiatric medicine probably to avoid oversedation. I'm concerned about the risk of her going back into full-blown mania. I am going to restart Depakote 503 times a day and Risperdal 1 mg twice a day which is lower than what she was taking downstairs. I will follow up daily. At this point she would not be able to come back to psychiatry in part because of her oxygen dependence. Depending on how she is doing medically we can reassess possibility of transfer in the future.  Disposition: Supportive therapy provided about ongoing stressors.  Alethia Berthold, MD 12/31/2015 6:04 PM

## 2015-12-31 NOTE — Progress Notes (Signed)
Pharmacy Antibiotic Note  Charlene [name] is a 55 y.o. female admitted on 12/30/2015 with pneumonia.  Pharmacy has been consulted for vancomycin and meropeenm dosing.  Plan: DW 67.6kg  Vd 47L kei 0.072 hr-1  T1/2 10 hours Vancomycin 1 gram q 12 hours ordered with stacked dosing. Level before 5th dose. Goal trough 15-20..  Meropenem 1 gram q 8 hours ordered.       Temp (24hrs), Avg:98 F (36.7 C), Min:97.5 F (36.4 C), Max:99.3 F (37.4 C)   Recent Labs Lab 12/30/15 1513 12/31/15 0011  WBC 9.0 8.8  CREATININE 0.57 0.56  LATICACIDVEN  --  0.7    Estimated Creatinine Clearance: 81.1 mL/min (by C-G formula based on Cr of 0.56).    Allergies  Allergen Reactions  . Amoxicillin-Pot Clavulanate Hives    Also vomiting  . Septra [Sulfamethoxazole-Trimethoprim] Hives and Nausea And Vomiting    Antimicrobials this admission: vancomycin  >>  meropenem  >>   Dose adjustments this admission:   Microbiology results: 7/17 BCx: pending 7/17 UCx: pending  7/17 Sputum: pending    7/17 UA: pending 7/17 CXR: R atelectasis vs. infiltrate  Thank you for allowing pharmacy to be a part of this patient's care.  Kelbie Moro S 12/31/2015 4:24 AM

## 2015-12-31 NOTE — Discharge Summary (Signed)
Physician Discharge Summary Note  Patient:  Charlene [name] is an 55 y.o., female MRN:  [id number] DOB:  1960/10/28 Patient phone:  [phone] (home)  Patient address:   [address] [place] [address],  Total Time spent with patient: d/c summary done retroactively  Date of Admission:  12/21/2015 Date of Discharge: 12/30/2015  Reason for Admission:  Psychotic break.  Identifying data. Charlene [name] is a 55 y.o. female with a history of bipolar disorder and disseminated smal cell lung carcinoma.   Chief complaint. "I need to get back on my medications."  History of present illness. Information was obtained from the patient and the chart. The patient has a long history of bipolar disorder but has not been treated for it recently as she has been receiving cancer treatment. In reviewing the notes of Dr. Oliva Bustard, her oncologist, it appears that the patient has been having behavioral problems and mood swings all along. She has been encouraged on multiple occasions psychiatric help. She was brought to the emergency room for agitated, combative behavior and disorganized, psychotic thinking. The patient claims that she herself was seeking help. The patient reports recent increase in energy. She's been cleaning and organizing all night long unable to sleep. She became grandiose and delusional. She believes that this person in energy is related to the fact that she has been cured of cancer. We understand from the chart and the patient that she just completed radiation therapy of the brain and dexamethasone treatment which could have been contributing factors. The patient denies symptoms of depression. She denies symptoms of psychosis and does not think that she is in a manic episode. She reports symptoms of PTSD and OCD. She adamantly denies alcohol or illicit substance use but her drug screen was positive for cocaine. She admitted to one of the nurses to using cocaine. 2 other people she claimed that  cocaine came from [facility] mouthwash.  Past psychiatric history. The patient has a long history of bipolar illness. She was hospitalized 8 or 10 times in the remote past. She attempted suicide twice by overdose "a long time ago." She has been treated with a combination of Depakote, lithium, Cymbalta, and Risperdal.  Family psychiatric history. Daughter with bipolar.  Social history. 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 She applied for disability and hopes to receive her first check very shortly. She believes that she will find an apartment get driver's license and a car and be independent. At the moment she lives with her parents.  Principal Problem: Bipolar I disorder, most recent episode manic, severe with psychotic features [facility]) Discharge Diagnoses: Patient Active Problem List   Diagnosis Date Noted  . HCAP (healthcare-associated pneumonia) [J18.9] 12/31/2015  . Acute encephalopathy [G93.40] 12/31/2015  . Herpes zoster [B02.9] 12/31/2015  . Anemia [D64.9] 12/31/2015  . Acute psychosis associated with endocrine, metabolic, or cerebrovascular disorder [F06.8, F01.50] 12/21/2015  . Cocaine use disorder, moderate, dependence ([place]) [F14.20] 12/21/2015  . Bipolar I disorder, most recent episode manic, severe with psychotic features ([place]) [F31.2] 12/21/2015  . OCD (obsessive compulsive disorder) [F42.9] 12/21/2015  . PTSD (post-traumatic stress disorder) [F43.10] 12/21/2015  . Abnormal cells of cervix [N87.9] 07/30/2015  . Encounter for general adult medical examination without abnormal findings [Z00.00] 07/28/2015  . Primary cancer of right lower lobe of lung ([place]) [C34.31] 07/12/2015  . At risk for noncompliance [Z87.898] 06/29/2015  . Spasm [R25.2] 06/26/2015  . Essential (primary) hypertension [I10] 06/21/2015  . Lung mass [R91.8] 06/21/2015  .  Candida infection of mouth [B37.0] 06/21/2015  . Small cell carcinoma of lung ([place]) [C34.90] 06/21/2015  . Episodic mood disorder ([place])  [F39] 10/14/2014  . Chronic obstructive pulmonary disease ([place]) [J44.9] 10/06/2014  . Pulmonary emphysema ([place]) [J43.9] 10/06/2014  . LBP (low back pain) [M54.5] 09/27/2014  . Tobacco use disorder [F17.200] 09/27/2014   Past Medical History:  Past Medical History  Diagnosis Date  . Primary cancer of right lower lobe of lung ([place]) 07/12/2015    Small cell undifferentiated carcinoma of lung.  Diagnosis at [facility] by fine-needle aspiration of lymph node (January, 2017)  . Bipolar 2 disorder ([place])   . Borderline schizophrenia   . Falls infrequently     Past Surgical History  Procedure Laterality Date  . Abdominal hysterectomy    . Appendectomy    . Stomach surgery    . Nasal sinus surgery      x2  . Peripheral vascular catheterization N/A 07/30/2015    Procedure: Glori Luis Cath Insertion;  Surgeon: Algernon Huxley, MD;  Location: [facility];  Service: Cardiovascular;  Laterality: N/A;   Family History:  Family History  Problem Relation Age of Onset  . Hypertension Father    Social History:  History  Alcohol Use No     History  Drug Use  . Yes  . Special: Cocaine    Comment: States due to cocaine in mouthwash    Social History   Social History  . Marital Status: Divorced    Spouse Name: N/A  . Number of Children: N/A  . Years of Education: N/A   Social History Main Topics  . Smoking status: Former Smoker -- 0.50 packs/day for 30 years    Quit date: 06/17/2015  . Smokeless tobacco: None  . Alcohol Use: No  . Drug Use: Yes    Special: Cocaine     Comment: States due to cocaine in mouthwash  . Sexual Activity: Not Asked   Other Topics Concern  . None   Social History Narrative    Hospital Course:    [name] is a 54 year old female with a history of bipolar disorder and stage IV cancer admitted in the manic psychotic episode while off medications.   1. Mood and psychosis. We started Depakote for mood stabilization and Risperdal for psychosis. VPA  54, ammonia 17 on 7/14/207.  2. Anxiety of OCD or PTSD type. We offered Luvox for OCD and depression.  3. COPD. We continued inhalers.  4. Smoking. Nicotine patch was available.  5. Insomnia. She did not respond to Trazodone. We started Restoril.   6. Chronic pain. She has been taking Voltaren and narcotic painkillers. We increased Flexeril to 10 mg three times daily. She reports neck and shoulder painl.   7. Thrush. She is on Diflucan and nystatin.  8. GERD. She is on Carafate.  9. Cancer treatment. Spoke with her providers at the cancer center. Following discharge, she will continue brain radiation therapy at the cancer center. Unfortunately, contrary to what the patient believes, she is NOT cured of cancer.   10. Metabolic syndrome screening. Lipid panel, TSH, hemoglobin A1c are normal. Prolactin 32.   11. Cellulitis. We started Vibramycin and bactroban.  12. Deconditioning. PT consult is greatly appreciated.   13. Discharge. . The patient decompensated on 7/15 and got even worse the next day with hypoxemia and productive cough. Apparently all her psychotropic medications were discontinued. She was transfer to medical floor.   14. Disposition. The patient  no longer can stay with her family. She will bee discharged to assisted living facility as the family is unable to care for her. PPD placed. PASSAR application submitted. She will folow up with [facility].  Physical Findings: AIMS: Facial and Oral Movements Muscles of Facial Expression: None, normal Lips and Perioral Area: None, normal Jaw: None, normal Tongue: None, normal,Extremity Movements Upper (arms, wrists, hands, fingers): None, normal Lower (legs, knees, ankles, toes): None, normal, Trunk Movements Neck, shoulders, hips: None, normal, Overall Severity Severity of abnormal movements (highest score from questions above): None, normal Incapacitation due to abnormal movements: None, normal Patient's awareness of abnormal  movements (rate only patient's report): No Awareness, Dental Status Current problems with teeth and/or dentures?: No Does patient usually wear dentures?: No  CIWA:    COWS:     Musculoskeletal: Strength & Muscle Tone: within normal limits Gait & Station: unsteady Patient leans: N/A  Psychiatric Specialty Exam: Physical Exam  ROS  Blood pressure 91/52, pulse 91, temperature 97.5 F (36.4 C), temperature source Oral, resp. rate 14, height '5\' 8"'$  (1.727 m), weight 67.586 kg (149 lb), SpO2 100 %.Body mass index is 22.66 kg/(m^2).  General Appearance: Casual  Eye Contact:  Fair  Speech:  Clear and Coherent  Volume:  Decreased  Mood:  Depressed  Affect:  Blunt  Thought Process:  Goal Directed  Orientation:  Full (Time, Place, and Person)  Thought Content:  WDL  Suicidal Thoughts:  No  Homicidal Thoughts:  No  Memory:  Immediate;   Fair Recent;   Fair Remote;   Fair  Judgement:  Impaired  Insight:  Lacking  Psychomotor Activity:  Decreased  Concentration:  Concentration: Fair and Attention Span: Fair  Recall:  [employer] of Knowledge:  Fair  Language:  Fair  Akathisia:  No  Handed:  Right  AIMS (if indicated):     Assets:  Communication Skills Desire for Improvement Resilience Social Support  ADL's:  Intact  Cognition:  WNL  Sleep:  Number of Hours: 8     Have you used any form of tobacco in the last 30 days? (Cigarettes, Smokeless Tobacco, Cigars, and/or Pipes): Yes  Has this patient used any form of tobacco in the last 30 days? (Cigarettes, Smokeless Tobacco, Cigars, and/or Pipes) Yes, Yes, A prescription for an FDA-approved tobacco cessation medication was offered at discharge and the patient refused  Blood Alcohol level:  No results found for: [facility]  Metabolic Disorder Labs:  Lab Results  Component Value Date   HGBA1C 6.2* 12/22/2015   Lab Results  Component Value Date   PROLACTIN 32.4* 12/22/2015   Lab Results  Component Value Date   CHOL 179 12/22/2015    TRIG 246* 12/22/2015   HDL 52 12/22/2015   CHOLHDL 3.4 12/22/2015   VLDL 49* 12/22/2015   [place] 78 12/22/2015    See Psychiatric Specialty Exam and Suicide Risk Assessment completed by Attending Physician prior to discharge.  Discharge destination:  Other:  medical floor  Is patient on multiple antipsychotic therapies at discharge:  No   Has Patient had three or more failed trials of antipsychotic monotherapy by history:  No  Recommended Plan for Multiple Antipsychotic Therapies: NA     Medication List    TAKE these medications      Indication   AYR SALINE NASAL DROPS 0.65 % (Soln) Soln  Generic drug:  Saline  2 drops by Each Nare route every four (4) hours as needed.      cyclobenzaprine 10  MG tablet  Commonly known as:  FLEXERIL  Take 10 mg by mouth.      diclofenac 75 MG EC tablet  Commonly known as:  VOLTAREN  Take 75 mg by mouth.      FIRST-DUKES MOUTHWASH Susp  Take 64m by mouth, swish and swallow three times a day      fluconazole 100 MG tablet  Commonly known as:  DIFLUCAN  Take 1 tablet (100 mg total) by mouth daily.      fluticasone 50 MCG/ACT nasal spray  Commonly known as:  FLONASE  1 spray by Each Nare route daily.      lidocaine 5 % ointment  Commonly known as:  XYLOCAINE  Apply topically.      loratadine 10 MG tablet  Commonly known as:  CLARITIN  Take 10 mg by mouth.      nystatin 100000 UNIT/ML suspension  Commonly known as:  MYCOSTATIN  Take by mouth. Reported on 11/01/2015      oxyCODONE-acetaminophen 7.5-325 MG tablet  Commonly known as:  PERCOCET  Take 1 tablet by mouth every 6 (six) hours as needed for severe pain.      prochlorperazine 10 MG tablet  Commonly known as:  COMPAZINE  Take 1 tablet (10 mg total) by mouth every 6 (six) hours as needed for nausea or vomiting.      PROVENTIL HFA 108 (90 Base) MCG/ACT inhaler  Generic drug:  albuterol      sucralfate 1 g tablet  Commonly known as:  CARAFATE  Take 1 tablet (1 g  total) by mouth 4 (four) times daily. Dissolve in 2-3 tbsp warm water, swish and swallow.      TESSALON PERLES 100 MG capsule  Generic drug:  benzonatate  Take 100 mg by mouth.            Follow-up Information    Follow up with [facility] 12/31/2015.   Why:  Your hospital follow up appointment is on Monday July 17th at 7:00am with HSherrian Divers Please call HLanae Boastat [phone] to appointment.    Contact information:   [address] [place] [phone]      Follow-up recommendations:  Activity:  as tolerated. Diet:  regular. Other:  keep follow up appointments.  Comments:      Signed: JOrson Slick MD 12/31/2015, 9:49 AM

## 2016-01-01 ENCOUNTER — Inpatient Hospital Stay: Payer: Medicaid Other

## 2016-01-01 LAB — COMPREHENSIVE METABOLIC PANEL
ALT: 19 U/L (ref 14–54)
AST: 24 U/L (ref 15–41)
Albumin: 2 g/dL — ABNORMAL LOW (ref 3.5–5.0)
Alkaline Phosphatase: 70 U/L (ref 38–126)
Anion gap: 6 (ref 5–15)
BUN: 7 mg/dL (ref 6–20)
CHLORIDE: 106 mmol/L (ref 101–111)
CO2: 25 mmol/L (ref 22–32)
Calcium: 7.4 mg/dL — ABNORMAL LOW (ref 8.9–10.3)
Creatinine, Ser: <0.3 mg/dL — ABNORMAL LOW (ref 0.44–1.00)
Glucose, Bld: 79 mg/dL (ref 65–99)
POTASSIUM: 3.4 mmol/L — AB (ref 3.5–5.1)
SODIUM: 137 mmol/L (ref 135–145)
Total Bilirubin: 0.3 mg/dL (ref 0.3–1.2)
Total Protein: 5.4 g/dL — ABNORMAL LOW (ref 6.5–8.1)

## 2016-01-01 LAB — CBC WITH DIFFERENTIAL/PLATELET
BASOS ABS: 0 10*3/uL (ref 0–0.1)
BASOS PCT: 0 %
EOS ABS: 0 10*3/uL (ref 0–0.7)
EOS PCT: 0 %
HCT: 26.5 % — ABNORMAL LOW (ref 35.0–47.0)
Hemoglobin: 9.2 g/dL — ABNORMAL LOW (ref 12.0–16.0)
Lymphocytes Relative: 4 %
Lymphs Abs: 0.3 10*3/uL — ABNORMAL LOW (ref 1.0–3.6)
MCH: 33.4 pg (ref 26.0–34.0)
MCHC: 34.7 g/dL (ref 32.0–36.0)
MCV: 96.1 fL (ref 80.0–100.0)
MONO ABS: 0.5 10*3/uL (ref 0.2–0.9)
Monocytes Relative: 6 %
Neutro Abs: 7.8 10*3/uL — ABNORMAL HIGH (ref 1.4–6.5)
Neutrophils Relative %: 90 %
PLATELETS: 137 10*3/uL — AB (ref 150–440)
RBC: 2.75 MIL/uL — AB (ref 3.80–5.20)
RDW: 15.7 % — AB (ref 11.5–14.5)
WBC: 8.7 10*3/uL (ref 3.6–11.0)

## 2016-01-01 LAB — MRSA PCR SCREENING: MRSA BY PCR: POSITIVE — AB

## 2016-01-01 LAB — URINE CULTURE

## 2016-01-01 LAB — TYPE AND SCREEN
ABO/RH(D): O POS
Antibody Screen: NEGATIVE
Unit division: 0

## 2016-01-01 LAB — VANCOMYCIN, TROUGH: VANCOMYCIN TR: 5 ug/mL — AB (ref 15–20)

## 2016-01-01 MED ORDER — ACYCLOVIR 200 MG PO CAPS
800.0000 mg | ORAL_CAPSULE | Freq: Three times a day (TID) | ORAL | Status: DC
  Administered 2016-01-01 – 2016-01-02 (×3): 800 mg via ORAL
  Filled 2016-01-01 (×3): qty 4

## 2016-01-01 MED ORDER — VANCOMYCIN HCL IN DEXTROSE 1-5 GM/200ML-% IV SOLN
1000.0000 mg | Freq: Three times a day (TID) | INTRAVENOUS | Status: DC
  Administered 2016-01-01 – 2016-01-03 (×5): 1000 mg via INTRAVENOUS
  Filled 2016-01-01 (×7): qty 200

## 2016-01-01 MED ORDER — MUPIROCIN 2 % EX OINT
1.0000 "application " | TOPICAL_OINTMENT | Freq: Two times a day (BID) | CUTANEOUS | Status: AC
  Administered 2016-01-01 – 2016-01-05 (×10): 1 via NASAL
  Filled 2016-01-01: qty 22

## 2016-01-01 MED ORDER — METHYLPREDNISOLONE SODIUM SUCC 125 MG IJ SOLR
60.0000 mg | Freq: Every day | INTRAMUSCULAR | Status: DC
  Administered 2016-01-01 – 2016-01-03 (×3): 60 mg via INTRAVENOUS
  Filled 2016-01-01 (×3): qty 2

## 2016-01-01 MED ORDER — CHLORHEXIDINE GLUCONATE CLOTH 2 % EX PADS
6.0000 | MEDICATED_PAD | Freq: Every day | CUTANEOUS | Status: AC
  Administered 2016-01-01 – 2016-01-05 (×5): 6 via TOPICAL

## 2016-01-01 MED ORDER — ACYCLOVIR 800 MG PO TABS
800.0000 mg | ORAL_TABLET | Freq: Three times a day (TID) | ORAL | Status: DC
  Filled 2016-01-01 (×2): qty 1

## 2016-01-01 MED ORDER — FUROSEMIDE 10 MG/ML IJ SOLN: 40.0000 mg | Freq: Once | INTRAMUSCULAR | Status: DC

## 2016-01-01 MED ORDER — DILTIAZEM HCL 30 MG PO TABS
30.0000 mg | ORAL_TABLET | Freq: Two times a day (BID) | ORAL | Status: DC
  Administered 2016-01-01 – 2016-01-08 (×14): 30 mg via ORAL
  Filled 2016-01-01 (×14): qty 1

## 2016-01-01 NOTE — Progress Notes (Signed)
  [facility] Adult Case Management Discharge Plan :  Will you be returning to the same living situation after discharge:  TBD, Pt discharged to medical floor to address medical needs At discharge, do you have transportation home?: TBD Do you have the ability to pay for your medications: TBD  Release of information consent forms completed and in the chart;  Patient's signature needed at discharge.  Patient to Follow up at: Follow-up Information    Go to [facility].   Why:  Your hospital follow up appointment is on the Monday following your discharge at the walk-in clinic for medication management, substance abuse treatment and therapy at 7:00am with Sherrian Divers. Please call Lanae Boast at [phone] prior to appointment.    Contact information:   [place] [address] Ph: [phone] Fax: [phone]       Next level of care provider has access to [place] and Suicide Prevention discussed: No. Discharged without notice to [place] and after hours  Have you used any form of tobacco in the last 30 days? (Cigarettes, Smokeless Tobacco, Cigars, and/or Pipes): Yes  Has patient been referred to the Quitline?: Patient refused referral  Patient has been referred for addiction treatment: Yes  Dossie Arbour PMSW, LCSW 01/01/2016, 11:10 AM

## 2016-01-01 NOTE — Consult Note (Signed)
[facility] Consult   Reason for Consult:  Follow-up consult for this 55 year old woman with a history of bipolar disorder who was transferred from the psychiatric ward to medical service last night Referring Physician:  Sudini Patient Identification: Charlene [name] MRN:  [id number] Principal Diagnosis: HCAP (healthcare-associated pneumonia) Diagnosis:   Patient Active Problem List   Diagnosis Date Noted  . HCAP (healthcare-associated pneumonia) [J18.9] 12/31/2015  . Acute encephalopathy [G93.40] 12/31/2015  . Herpes zoster [B02.9] 12/31/2015  . Anemia [D64.9] 12/31/2015  . Acute psychosis associated with endocrine, metabolic, or cerebrovascular disorder [F06.8, F01.50] 12/21/2015  . Cocaine use disorder, moderate, dependence ([place]) [F14.20] 12/21/2015  . Bipolar I disorder, most recent episode manic, severe with psychotic features ([place]) [F31.2] 12/21/2015  . OCD (obsessive compulsive disorder) [F42.9] 12/21/2015  . PTSD (post-traumatic stress disorder) [F43.10] 12/21/2015  . Abnormal cells of cervix [N87.9] 07/30/2015  . Encounter for general adult medical examination without abnormal findings [Z00.00] 07/28/2015  . Primary cancer of right lower lobe of lung ([place]) [C34.31] 07/12/2015  . At risk for noncompliance [Z87.898] 06/29/2015  . Spasm [R25.2] 06/26/2015  . Essential (primary) hypertension [I10] 06/21/2015  . Lung mass [R91.8] 06/21/2015  . Candida infection of mouth [B37.0] 06/21/2015  . 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 Episodic mood disorder ([place]) [F39] 10/14/2014  . Chronic obstructive pulmonary disease ([place]) [J44.9] 10/06/2014  . Pulmonary emphysema ([place]) [J43.9] 10/06/2014  . LBP (low back pain) [M54.5] 09/27/2014  . Tobacco use disorder [F17.200] 09/27/2014    Total Time spent with patient: 20 minutes  Subjective:   Charlene [name] is a 55 y.o. female patient admitted with "I'm feeling better".  Follow-up for this  55 year old woman with bipolar disorder and current pneumonia. Patient seen and chart reviewed. Patient says she continues to feel better. Still has some shortness of breath and cough. Mood is feeling stable. Doesn't feel hyperactive. Doesn't make any delusional or bizarre comments. Shows reasonably good insight about her condition.  HPI:  55 year old woman with bipolar disorder and lung cancer. She had been admitted to the hospital psychiatrically recently for a manic episode. At the time of admission she was described as being hyperverbal hyperactive grandiose and delusional. She was being treated with antipsychotic and mood stabilizing medicine on the psychiatric ward. Had been hyperactive and agitated up until a couple days ago and then this weekend became sedated. Yesterday became extremely lethargic. Oxygen level dropped. Last night after a rapid response was called she was transferred to the medical service. On interview today the patient says she is feeling a little better. She no she is in the hospital and she is able to give me at least a vague answer indicating she know she was on the psychiatric ward. She says her mood is feeling okay. Denies hallucinations. Denies suicidal thoughts. She is still a little sluggish and out of bed and not able to answer in much more detail than that. Much more active however than she was yesterday.  Past Psychiatric History: Long history of bipolar disorder multiple prior hospitalizations for mania.  Risk to Self: Is patient at risk for suicide?: Nopast history of suicide attempts and agitation and poor self-care Risk to Others:  not threatening Prior Inpatient Therapy:  history of multiple prior inpatient treatments Prior Outpatient Therapy:  on history of bipolar disorder  Past Medical History:  Past Medical History  Diagnosis Date  . Primary cancer of right lower lobe of lung ([place]) 07/12/2015  Small cell undifferentiated carcinoma of lung.  Diagnosis at  [facility] by fine-needle aspiration of lymph node (January, 2017)  . Bipolar 2 disorder ([place])   . Borderline schizophrenia   . Falls infrequently     Past Surgical History  Procedure Laterality Date  . Abdominal hysterectomy    . Appendectomy    . Stomach surgery    . Nasal sinus surgery      x2  . Peripheral vascular catheterization N/A 07/30/2015    Procedure: Glori Luis Cath Insertion;  Surgeon: Algernon Huxley, MD;  Location: [facility];  Service: Cardiovascular;  Laterality: N/A;   Family History:  Family History  Problem Relation Age of Onset  . Hypertension Father    Family Psychiatric  History: Patient denies being aware of any Social History:  History  Alcohol Use No     History  Drug Use  . Yes  . Special: Cocaine    Comment: States due to cocaine in mouthwash    Social History   Social History  . Marital Status: Divorced    Spouse Name: N/A  . Number of Children: N/A  . Years of Education: N/A   Social History Main Topics  . Smoking status: Former Smoker -- 0.50 packs/day for 30 years    Quit date: 06/17/2015  . Smokeless tobacco: None  . Alcohol Use: No  . Drug Use: Yes    Special: Cocaine     Comment: States due to cocaine in mouthwash  . Sexual Activity: Not Asked   Other Topics Concern  . None   Social History Narrative   Additional Social History:    Allergies:   Allergies  Allergen Reactions  . Amoxicillin-Pot Clavulanate Hives    Also vomiting  . Septra [Sulfamethoxazole-Trimethoprim] Hives and Nausea And Vomiting    Labs:  Results for orders placed or performed during the hospital encounter of 12/30/15 (from the past 48 hour(s))  Comprehensive metabolic panel     Status: Abnormal   Collection Time: 12/31/15 12:11 AM  Result Value Ref Range   Sodium 139 135 - 145 mmol/L   Potassium 3.2 (L) 3.5 - 5.1 mmol/L   Chloride 103 101 - 111 mmol/L   CO2 30 22 - 32 mmol/L   Glucose, Bld 87 65 - 99 mg/dL   BUN 15 6 - 20 mg/dL    Creatinine, Ser 0.56 0.44 - 1.00 mg/dL   Calcium 8.0 (L) 8.9 - 10.3 mg/dL   Total Protein 5.3 (L) 6.5 - 8.1 g/dL   Albumin 2.1 (L) 3.5 - 5.0 g/dL   AST 31 15 - 41 U/L   ALT 22 14 - 54 U/L   Alkaline Phosphatase 65 38 - 126 U/L   Total Bilirubin 0.2 (L) 0.3 - 1.2 mg/dL   GFR calc non Af Amer >60 >60 mL/min   GFR calc Af Amer >60 >60 mL/min    Comment: (NOTE) The eGFR has been calculated using the CKD EPI equation. This calculation has not been validated in all clinical situations. eGFR's persistently <60 mL/min signify possible Chronic Kidney Disease.    Anion gap 6 5 - 15  CBC     Status: Abnormal   Collection Time: 12/31/15 12:11 AM  Result Value Ref Range   WBC 8.8 3.6 - 11.0 K/uL   RBC 2.18 (L) 3.80 - 5.20 MIL/uL   Hemoglobin 7.4 (L) 12.0 - 16.0 g/dL   HCT 21.4 (L) 35.0 - 47.0 %   MCV 98.1 80.0 -  100.0 fL   MCH 33.8 26.0 - 34.0 pg   MCHC 34.4 32.0 - 36.0 g/dL   RDW 14.6 (H) 11.5 - 14.5 %   Platelets 154 150 - 440 K/uL  Troponin I     Status: None   Collection Time: 12/31/15 12:11 AM  Result Value Ref Range   Troponin I <0.03 <0.03 ng/mL  Lactic acid, plasma     Status: None   Collection Time: 12/31/15 12:11 AM  Result Value Ref Range   Lactic Acid, Venous 0.7 0.5 - 1.9 mmol/L  Blood gas, arterial     Status: Abnormal   Collection Time: 12/31/15 12:19 AM  Result Value Ref Range   FIO2 1.00    Delivery systems NON-REBREATHER OXYGEN MASK    Inspiratory PAP PENDING    Expiratory PAP PENDING    pH, Arterial 7.39 7.350 - 7.450   pCO2 arterial 54 (H) 32.0 - 48.0 mmHg   pO2, Arterial 160 (H) 83.0 - 108.0 mmHg   Bicarbonate 32.7 (H) 21.0 - 28.0 mEq/L   Acid-Base Excess 5.9 (H) 0.0 - 3.0 mmol/L   O2 Saturation 99.4 %   Patient temperature 37.0    Oxygen index PENDING    Collection site RIGHT RADIAL    Sample type ARTERIAL DRAW    Allens test (pass/fail) PASS PASS  Type and screen [facility]     Status: None   Collection Time: 12/31/15  1:29 AM    Result Value Ref Range   ABO/RH(D) O POS    Antibody Screen NEG    Sample Expiration 01/03/2016    Unit Number [id number]    Blood Component Type RED CELLS,LR    Unit division 00    Status of Unit ISSUED,FINAL    Transfusion Status OK TO TRANSFUSE    Crossmatch Result Compatible   Prepare RBC     Status: None   Collection Time: 12/31/15  1:29 AM  Result Value Ref Range   Order Confirmation ORDER PROCESSED BY BLOOD BANK   ABO/Rh     Status: None   Collection Time: 12/31/15  1:29 AM  Result Value Ref Range   ABO/RH(D) O POS   Urinalysis complete, with microscopic ([facility] only)     Status: Abnormal   Collection Time: 12/31/15  4:20 AM  Result Value Ref Range   Color, Urine YELLOW (A) YELLOW   APPearance HAZY (A) CLEAR   Glucose, UA NEGATIVE NEGATIVE mg/dL   Bilirubin Urine NEGATIVE NEGATIVE   Ketones, ur TRACE (A) NEGATIVE mg/dL   Specific Gravity, Urine 1.019 1.005 - 1.030   Hgb urine dipstick 1+ (A) NEGATIVE   pH 5.0 5.0 - 8.0   Protein, ur 30 (A) NEGATIVE mg/dL   Nitrite NEGATIVE NEGATIVE   Leukocytes, UA 3+ (A) NEGATIVE   RBC / HPF TOO NUMEROUS TO COUNT 0 - 5 RBC/hpf   WBC, UA TOO NUMEROUS TO COUNT 0 - 5 WBC/hpf   Bacteria, UA RARE (A) NONE SEEN   Squamous Epithelial / LPF 0-5 (A) NONE SEEN   Mucous PRESENT    Hyaline Casts, UA PRESENT   Urine culture     Status: Abnormal   Collection Time: 12/31/15  4:21 AM  Result Value Ref Range   Specimen Description URINE, RANDOM    Special Requests NONE    Culture MULTIPLE SPECIES PRESENT, SUGGEST RECOLLECTION (A)    Report Status 01/01/2016 FINAL   Lactic acid, plasma     Status: None   Collection Time:  12/31/15  9:45 AM  Result Value Ref Range   Lactic Acid, Venous 0.6 0.5 - 1.9 mmol/L  Hemoglobin and hematocrit, blood     Status: Abnormal   Collection Time: 12/31/15  9:45 AM  Result Value Ref Range   Hemoglobin 9.2 (L) 12.0 - 16.0 g/dL   HCT 26.8 (L) 35.0 - 47.0 %  Hemoglobin     Status: Abnormal   Collection Time:  12/31/15  9:59 PM  Result Value Ref Range   Hemoglobin 8.9 (L) 12.0 - 16.0 g/dL  MRSA PCR Screening     Status: Abnormal   Collection Time: 01/01/16  3:35 AM  Result Value Ref Range   MRSA by PCR POSITIVE (A) NEGATIVE    Comment: RESULT CALLED TO, READ BACK BY AND VERIFIED WITH: KASEY VAUGHANS WARD ON 01/01/16 AT 0457 BY [facility] The GeneXpert MRSA Assay (FDA approved for NASAL specimens only), is one component of a comprehensive MRSA colonization surveillance program. It is not intended to diagnose MRSA infection nor to guide or monitor treatment for MRSA infections.   CBC with Differential/Platelet     Status: Abnormal   Collection Time: 01/01/16  5:27 AM  Result Value Ref Range   WBC 8.7 3.6 - 11.0 K/uL   RBC 2.75 (L) 3.80 - 5.20 MIL/uL   Hemoglobin 9.2 (L) 12.0 - 16.0 g/dL   HCT 26.5 (L) 35.0 - 47.0 %   MCV 96.1 80.0 - 100.0 fL   MCH 33.4 26.0 - 34.0 pg   MCHC 34.7 32.0 - 36.0 g/dL   RDW 15.7 (H) 11.5 - 14.5 %   Platelets 137 (L) 150 - 440 K/uL   Neutrophils Relative % 90 %   Neutro Abs 7.8 (H) 1.4 - 6.5 K/uL   Lymphocytes Relative 4 %   Lymphs Abs 0.3 (L) 1.0 - 3.6 K/uL   Monocytes Relative 6 %   Monocytes Absolute 0.5 0.2 - 0.9 K/uL   Eosinophils Relative 0 %   Eosinophils Absolute 0.0 0 - 0.7 K/uL   Basophils Relative 0 %   Basophils Absolute 0.0 0 - 0.1 K/uL  Comprehensive metabolic panel     Status: Abnormal   Collection Time: 01/01/16  5:27 AM  Result Value Ref Range   Sodium 137 135 - 145 mmol/L   Potassium 3.4 (L) 3.5 - 5.1 mmol/L   Chloride 106 101 - 111 mmol/L   CO2 25 22 - 32 mmol/L   Glucose, Bld 79 65 - 99 mg/dL   BUN 7 6 - 20 mg/dL   Creatinine, Ser <0.30 (L) 0.44 - 1.00 mg/dL   Calcium 7.4 (L) 8.9 - 10.3 mg/dL   Total Protein 5.4 (L) 6.5 - 8.1 g/dL   Albumin 2.0 (L) 3.5 - 5.0 g/dL   AST 24 15 - 41 U/L   ALT 19 14 - 54 U/L   Alkaline Phosphatase 70 38 - 126 U/L   Total Bilirubin 0.3 0.3 - 1.2 mg/dL   GFR calc non Af Amer NOT CALCULATED >60 mL/min     GFR calc Af Amer NOT CALCULATED >60 mL/min    Comment: (NOTE) The eGFR has been calculated using the CKD EPI equation. This calculation has not been validated in all clinical situations. eGFR's persistently <60 mL/min signify possible Chronic Kidney Disease.    Anion gap 6 5 - 15    Current Facility-Administered Medications  Medication Dose Route Frequency Provider Last Rate Last Dose  . 0.9 %  sodium chloride infusion   Intravenous Once  Lance Coon, MD      . acetaminophen (TYLENOL) tablet 650 mg  650 mg Oral Q6H PRN Lance Coon, MD   650 mg at 12/31/15 1009   Or  . acetaminophen (TYLENOL) suppository 650 mg  650 mg Rectal Q6H PRN Lance Coon, MD      . acyclovir (ZOVIRAX) 200 MG capsule 800 mg  800 mg Oral TID [name], MD   800 mg at 01/01/16 1537  . azithromycin (ZITHROMAX) 500 mg in dextrose 5 % 250 mL IVPB  500 mg Intravenous q1800 [name], MD   500 mg at 01/01/16 1731  . Chlorhexidine Gluconate Cloth 2 % PADS 6 each  6 each Topical Q0600 Lance Coon, MD   6 each at 01/01/16 0645  . chlorpheniramine-HYDROcodone (TUSSIONEX) 10-8 MG/5ML suspension 5 mL  5 mL Oral Q12H Srikar Sudini, MD   5 mL at 01/01/16 1046  . diltiazem (CARDIZEM) tablet 30 mg  30 mg Oral Q12H Srikar Sudini, MD      . divalproex (DEPAKOTE) DR tablet 500 mg  500 mg Oral Q8H Gonzella Lex, MD   500 mg at 01/01/16 1510  . HYDROcodone-acetaminophen (NORCO) 7.5-325 MG per tablet 1 tablet  1 tablet Oral Q6H PRN [name], MD   1 tablet at 01/01/16 0809  . ipratropium-albuterol (DUONEB) 0.5-2.5 (3) MG/3ML nebulizer solution 3 mL  3 mL Nebulization Q4H PRN [name], MD   3 mL at 01/01/16 0814  . meropenem (MERREM) 1 g in sodium chloride 0.9 % 100 mL IVPB  1 g Intravenous Q8H Lance Coon, MD   1 g at 01/01/16 1145  . methylPREDNISolone sodium succinate (SOLU-MEDROL) 125 mg/2 mL injection 60 mg  60 mg Intravenous Daily [name], MD   60 mg at 01/01/16 1509  . mupirocin ointment (BACTROBAN) 2 %  1 application  1 application Nasal BID Lance Coon, MD   1 application at 83/66/29 1045  . ondansetron (ZOFRAN) tablet 4 mg  4 mg Oral Q6H PRN Lance Coon, MD       Or  . ondansetron [facility]) injection 4 mg  4 mg Intravenous Q6H PRN Lance Coon, MD      . pantoprazole (PROTONIX) 80 mg in sodium chloride 0.9 % 250 mL (0.32 mg/mL) infusion  8 mg/hr Intravenous Continuous Lance Coon, MD 25 mL/hr at 01/01/16 1107 8 mg/hr at 01/01/16 1107  . [START ON 01/03/2016] pantoprazole (PROTONIX) injection 40 mg  40 mg Intravenous Q12H Lance Coon, MD      . risperiDONE (RISPERDAL) tablet 1 mg  1 mg Oral BID Gonzella Lex, MD   1 mg at 01/01/16 1045  . sodium chloride flush (NS) 0.9 % injection 3 mL  3 mL Intravenous Q12H Lance Coon, MD   3 mL at 01/01/16 1000  . vancomycin (VANCOCIN) IVPB 1000 mg/200 mL premix  1,000 mg Intravenous Q12H Lance Coon, MD   1,000 mg at 01/01/16 0741    Musculoskeletal: Strength & Muscle Tone: decreased Gait & Station: unable to stand Patient leans: N/A  Psychiatric Specialty Exam: Physical Exam  Nursing note and vitals reviewed. HENT:  Head: Normocephalic and atraumatic.  Eyes: Conjunctivae are normal. Pupils are equal, round, and reactive to light.  Neck: Normal range of motion.  Cardiovascular: Regular rhythm and normal heart sounds.   Respiratory: She is in respiratory distress.  GI: Soft.  Musculoskeletal: Normal range of motion.  Neurological: She is alert.  Skin: Skin is warm and dry.     Psychiatric:  Her mood appears anxious. Her speech is delayed. She is slowed. Cognition and memory are impaired. She expresses impulsivity.    Review of Systems  Constitutional: Negative.   HENT: Negative.   Eyes: Negative.   Respiratory: Positive for cough, sputum production and shortness of breath.   Cardiovascular: Negative.   Gastrointestinal: Negative.   Musculoskeletal: Negative.   Skin: Negative.   Neurological: Negative.   Psychiatric/Behavioral:  Positive for memory loss. Negative for depression, suicidal ideas, hallucinations and substance abuse. The patient is nervous/anxious and has insomnia.     Blood pressure 105/64, pulse 102, temperature 98.1 F (36.7 C), temperature source Oral, resp. rate 18, height '5\' 8"'$  (1.727 m), weight 71.442 kg (157 lb 8 oz), SpO2 100 %.Body mass index is 23.95 kg/(m^2).  General Appearance: Disheveled  Eye Contact:  Minimal  Speech:  Garbled and Slow  Volume:  Decreased  Mood:  Euthymic  Affect:  Constricted  Thought Process:  Coherent  Orientation:  Full (Time, Place, and Person)  Thought Content:  Logical  Suicidal Thoughts:  No  Homicidal Thoughts:  No  Memory:  Immediate;   Fair Recent;   Fair Remote;   Fair  Judgement:  Fair  Insight:  Fair  Psychomotor Activity:  Decreased  Concentration:  Concentration: Fair  Recall:  [employer] of Knowledge:  Fair  Language:  Fair  Akathisia:  No  Handed:  Right  AIMS (if indicated):     Assets:  Financial Resources/Insurance Resilience Social Support  ADL's:  Impaired  Cognition:  Impaired,  Mild  Sleep:        Treatment Plan Summary: Daily contact with patient to assess and evaluate symptoms and progress in treatment, Medication management and Plan Patient was alert and oriented and more energetic today. Her speech pattern was normal. Thoughts appeared to be clear. Did not make any delusional statements. Not hyperactive or hyperverbal. Appears to be stabilizing. Unclear if she will need return to psychiatric ward once medically stable. Supportive therapy and continue current psychiatric medicine.  Disposition: Supportive therapy provided about ongoing stressors.  Alethia Berthold, MD 01/01/2016 7:23 PM

## 2016-01-01 NOTE — Progress Notes (Signed)
[facility] [name]: Charlene [name]    MR#:  [id number]  DATE OF BIRTH:  [birth year]  SUBJECTIVE:  CHIEF COMPLAINT:  No chief complaint on file.  More awake today but still lethargic.  Has SOB and cough.  1 unit PRBC transfused on 12/31/2015  REVIEW OF SYSTEMS:    Review of Systems  Unable to perform ROS: mental status change    DRUG ALLERGIES:   Allergies  Allergen Reactions  . Amoxicillin-Pot Clavulanate Hives    Also vomiting  . Septra [Sulfamethoxazole-Trimethoprim] Hives and Nausea And Vomiting    VITALS:  Blood pressure 105/64, pulse 102, temperature 98.1 F (36.7 C), temperature source Oral, resp. rate 18, SpO2 100 %.  PHYSICAL EXAMINATION:   Physical Exam  GENERAL:  55 y.o.-year-old patient lying in the bed with mild resp distress EYES: Pupils equal, round, reactive to light and accommodation. No scleral icterus. Extraocular muscles intact.  HEENT: Head atraumatic, normocephalic. Oropharynx and nasopharynx clear.  NECK:  Supple, no jugular venous distention. No thyroid enlargement, no tenderness.  LUNGS: Normal breath sounds bilaterally, no wheezing, rales, rhonchi. No use of accessory muscles of respiration. Port-A-Cath in place CARDIOVASCULAR: S1, S2 normal. No murmurs, rubs, or gallops.  ABDOMEN: Soft, nontender, nondistended. Bowel sounds present. No organomegaly or mass.  EXTREMITIES: No cyanosis, clubbing or edema b/l.    NEUROLOGIC: Moves all 4 extremities PSYCHIATRIC: The patient is drowzy  LABORATORY PANEL:   CBC  Recent Labs Lab 01/01/16 0527  WBC 8.7  HGB 9.2*  HCT 26.5*  PLT 137*   ------------------------------------------------------------------------------------------------------------------ Chemistries   Recent Labs Lab 01/01/16 0527  NA 137  K 3.4*  CL 106  CO2 25  GLUCOSE 79  BUN 7  CREATININE <0.30*  CALCIUM 7.4*  AST 24  ALT 19  ALKPHOS 70  BILITOT 0.3    ------------------------------------------------------------------------------------------------------------------  Cardiac Enzymes  Recent Labs Lab 12/31/15 0011  TROPONINI <0.03   ------------------------------------------------------------------------------------------------------------------  RADIOLOGY:  Dg Chest 2 View  01/01/2016  CLINICAL DATA:  Patient with hypoxia.  History of lung cancer. EXAM: CHEST  2 VIEW COMPARISON:  Chest radiograph 12/31/2015 FINDINGS: Right anterior chest wall Port-A-Cath is present with tip projecting over the superior cavoatrial junction. Left upper extremity PICC line tip projects over the superior vena cava. Stable cardiac and mediastinal contours. Re- demonstrated moderate right pleural effusion with underlying pulmonary consolidation. No pneumothorax. IMPRESSION: Stable support apparatus. Persistent moderate right pleural effusion and underlying pulmonary consolidation. Electronically Signed   By: Lovey Newcomer M.D.   On: 01/01/2016 13:20   Dg Chest 2 View  12/30/2015  CLINICAL DATA:  Hypoxia, stage IV brain cancer. EXAM: CHEST  2 VIEW COMPARISON:  Radiographs of December 20, 2015. FINDINGS: The heart size and mediastinal contours are within normal limits. Right internal jugular Port-A-Cath is unchanged in position. No pneumothorax is noted. Left lung is clear. Elevated right hemidiaphragm is noted with associated atelectasis or infiltrate. Some degree of pleural effusion cannot be excluded. The visualized skeletal structures are unremarkable. IMPRESSION: Interval development of elevated right hemidiaphragm with associated subsegmental atelectasis or infiltrate. Some degree of pleural effusion cannot be excluded. Electronically Signed   By: Marijo Conception, M.D.   On: 12/30/2015 14:20   Ct Head Wo Contrast  12/31/2015  CLINICAL DATA:  Altered level of consciousness. EXAM: CT HEAD WITHOUT CONTRAST TECHNIQUE: Contiguous axial images were obtained from the base of  the skull through the vertex without intravenous contrast. COMPARISON:  CT scan of July 16, 2015. FINDINGS: Bilateral maxillary sinusitis is noted. Bony calvarium appears intact. No mass effect or midline shift is noted. Ventricular size is within normal limits. There is no evidence of mass lesion, hemorrhage or acute infarction. Mild chronic ischemic white matter disease is noted. IMPRESSION: Bilateral maxillary sinusitis. Mild chronic ischemic white matter disease. No acute intracranial abnormality seen. Electronically Signed   By: Marijo Conception, M.D.   On: 12/31/2015 15:09   Dg Chest Port 1 View  12/31/2015  CLINICAL DATA:  PICC line placement EXAM: PORTABLE CHEST 1 VIEW COMPARISON:  12/30/2015 FINDINGS: Left arm PICC terminates at the cavoatrial junction. Right chest port terminates at cavoatrial junction. Moderate right pleural effusion. Associated right lower lobe opacity, likely atelectasis. Left lung is clear. No pneumothorax. Heart is top-normal in size. IMPRESSION: Left arm PICC terminates at the cavoatrial junction. Otherwise unchanged. Electronically Signed   By: Julian Hy M.D.   On: 12/31/2015 18:37     ASSESSMENT AND PLAN:   * HCAP (healthcare-associated pneumonia) with Acute hypoxic respiratory failure and sepsis IV abx. Await cx. Nebs PRN Wean of O2 as tolerate Repeat CXR today shows persistent RLL infiltrate and pleural effusion  * Acute encephalopathy - Likely multifactorial With hypoxia, bipolar. CT head showed nothing acute   *Anemia- Improved with 1 unit packed RBC transfused No blood in stool   *Herpes zoster - zoster-like dermatomal rash in her gluteal region.  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 Management plans discussed with the patient, family and they are in agreement.  CODE STATUS: FULL CODE  DVT Prophylaxis: SCDs  TOTAL TIME TAKING CARE OF THIS PATIENT: 35 minutes.   POSSIBLE D/C IN 2-3 DAYS, DEPENDING ON CLINICAL CONDITION.  [name] R M.D on 01/01/2016 at 1:43 PM  Between 7am to 6pm - Pager - [phone]  After 6pm go to [email or web address] - password [email or web address] [facility]  Office  [phone]  CC: Primary care physician; Jiles Garter, MD  Note: This dictation was prepared with Dragon dictation along with smaller phrase technology. Any transcriptional errors that result from this process are unintentional.

## 2016-01-01 NOTE — Progress Notes (Signed)
Charlene Lame, MD [facility]   [address] [place], [place] [address] Phone: [phone] Fax : [phone]   Subjective: The patient has no new complaints today.  She is in bed sitting up and eating her lunch.  She continues to deny any abdominal pain, nausea,  Vomiting, fevers or chills.  The patient's hemoglobin is stable at the present time after 1 unit of blood yesterday.   Objective: Vital signs in last 24 hours: Filed Vitals:   01/01/16 0811 01/01/16 0834 01/01/16 1000 01/01/16 1500  BP:  117/67 105/64   Pulse:  101 102   Temp:  98.1 F (36.7 C)    TempSrc:  Oral    Resp:  18    Height:    '5\' 8"'$  (1.727 m)  Weight:    157 lb 8 oz (71.442 kg)  SpO2: 100% 100%     Weight change:   Intake/Output Summary (Last 24 hours) at 01/01/16 1906 Last data filed at 01/01/16 1816  Gross per 24 hour  Intake    480 ml  Output    200 ml  Net    280 ml     Exam: Heart:: Regular rate and rhythm Abdomen: soft, nontender, normal bowel sounds   Lab Results: '@LABTEST2'$ @ Micro Results: Recent Results (from the past 240 hour(s))  Culture, blood (Routine X 2) w Reflex to ID Panel     Status: None (Preliminary result)   Collection Time: 12/31/15 12:11 AM  Result Value Ref Range Status   Specimen Description BLOOD LEFT ANTECUBITAL  Final   Special Requests BOTTLES DRAWN AEROBIC AND ANAEROBIC 8CC  Final   Culture NO GROWTH 1 DAY  Final   Report Status PENDING  Incomplete  Culture, blood (Routine X 2) w Reflex to ID Panel     Status: None (Preliminary result)   Collection Time: 12/31/15 12:11 AM  Result Value Ref Range Status   Specimen Description BLOOD RIGHT ANTECUBITAL  Final   Special Requests BOTTLES DRAWN AEROBIC AND ANAEROBIC 8CC  Final   Culture NO GROWTH 1 DAY  Final   Report Status PENDING  Incomplete  Urine culture     Status: Abnormal   Collection Time: 12/31/15  4:21 AM  Result Value Ref Range Status   Specimen Description URINE, RANDOM  Final   Special Requests  NONE  Final   Culture MULTIPLE SPECIES PRESENT, SUGGEST RECOLLECTION (A)  Final   Report Status 01/01/2016 FINAL  Final  MRSA PCR Screening     Status: Abnormal   Collection Time: 01/01/16  3:35 AM  Result Value Ref Range Status   MRSA by PCR POSITIVE (A) NEGATIVE Final    Comment: RESULT CALLED TO, READ BACK BY AND VERIFIED WITH: KASEY VAUGHANS WARD ON 01/01/16 AT 0457 BY [facility] The GeneXpert MRSA Assay (FDA approved for NASAL specimens only), is one component of a comprehensive MRSA colonization surveillance program. It is not intended to diagnose MRSA infection nor to guide or monitor treatment for MRSA infections.    Studies/Results: Dg Chest 2 View  01/01/2016  CLINICAL DATA:  Patient with hypoxia.  History of lung cancer. EXAM: CHEST  2 VIEW COMPARISON:  Chest radiograph 12/31/2015 FINDINGS: Right anterior chest wall Port-A-Cath is present with tip projecting over the superior cavoatrial junction. Left upper extremity PICC line tip projects over the superior vena cava. Stable cardiac and mediastinal contours. Re- demonstrated moderate right pleural effusion with underlying pulmonary consolidation. No pneumothorax. IMPRESSION: Stable support apparatus. Persistent moderate right pleural effusion  and underlying pulmonary consolidation. Electronically Signed   By: Lovey Newcomer M.D.   On: 01/01/2016 13:20   Ct Head Wo Contrast  12/31/2015  CLINICAL DATA:  Altered level of consciousness. EXAM: CT HEAD WITHOUT CONTRAST TECHNIQUE: Contiguous axial images were obtained from the base of the skull through the vertex without intravenous contrast. COMPARISON:  CT scan of July 16, 2015. FINDINGS: Bilateral maxillary sinusitis is noted. Bony calvarium appears intact. No mass effect or midline shift is noted. Ventricular size is within normal limits. There is no evidence of mass lesion, hemorrhage or acute infarction. Mild chronic ischemic white matter disease is noted. IMPRESSION: Bilateral maxillary  sinusitis. Mild chronic ischemic white matter disease. No acute intracranial abnormality seen. Electronically Signed   By: Marijo Conception, M.D.   On: 12/31/2015 15:09   Dg Chest Port 1 View  12/31/2015  CLINICAL DATA:  PICC line placement EXAM: PORTABLE CHEST 1 VIEW COMPARISON:  12/30/2015 FINDINGS: Left arm PICC terminates at the cavoatrial junction. Right chest port terminates at cavoatrial junction. Moderate right pleural effusion. Associated right lower lobe opacity, likely atelectasis. Left lung is clear. No pneumothorax. Heart is top-normal in size. IMPRESSION: Left arm PICC terminates at the cavoatrial junction. Otherwise unchanged. Electronically Signed   By: Julian Hy M.D.   On: 12/31/2015 18:37   Medications: I have reviewed the patient's current medications. Scheduled Meds: . sodium chloride   Intravenous Once  . acyclovir  800 mg Oral TID  . azithromycin  500 mg Intravenous q1800  . Chlorhexidine Gluconate Cloth  6 each Topical Q0600  . chlorpheniramine-HYDROcodone  5 mL Oral Q12H  . diltiazem  30 mg Oral Q12H  . divalproex  500 mg Oral Q8H  . meropenem (MERREM) IV  1 g Intravenous Q8H  . methylPREDNISolone (SOLU-MEDROL) injection  60 mg Intravenous Daily  . mupirocin ointment  1 application Nasal BID  . [START ON 01/03/2016] pantoprazole  40 mg Intravenous Q12H  . risperiDONE  1 mg Oral BID  . sodium chloride flush  3 mL Intravenous Q12H  . vancomycin  1,000 mg Intravenous Q12H   Continuous Infusions: . pantoprozole (PROTONIX) infusion 8 mg/hr (01/01/16 1107)   PRN Meds:.acetaminophen **OR** acetaminophen, HYDROcodone-acetaminophen, ipratropium-albuterol, ondansetron **OR** ondansetron (ZOFRAN) IV   Assessment: Principal Problem:   HCAP (healthcare-associated pneumonia) Active Problems:   Essential (primary) hypertension   Small cell carcinoma of lung (HCC)   Bipolar I disorder, most recent episode manic, severe with psychotic features ([place])   Acute  encephalopathy   Herpes zoster   Anemia    Plan: This patient has metastatic lung cancer with shortness of breath and anemia.  The patient is eating well and her hemoglobin today was 9.2.  The patient will continue to receive conservative therapy.  There is no plan to do any endoscopies on this patient at the present time.   LOS: 2 days   Charlene [name] 01/01/2016, 7:06 PM

## 2016-01-01 NOTE — Progress Notes (Signed)
Pharmacy Antibiotic Note  Charlene [name] is a 55 y.o. female admitted on 12/30/2015 with pneumonia.  Pharmacy has been consulted for vancomycin and meropeenm dosing.  Plan: DW 67.6kg  Vd 47L kei 0.072 hr-1  T1/2 10 hours Vancomycin 1 gram q 12 hours ordered with stacked dosing. Level before 5th dose. Goal trough 15-20..  Meropenem 1 gram q 8 hours ordered.    5/18 Trough results =5 Spoke with RN who is unaware of any missed doses/incompelte doses. All doses were charted. Will increase dose to vancomycin 1g q 8 hours. Recheck trough prior to the 4th new dose. 7/20 @ 0345  Height: '5\' 8"'$  (172.7 cm) Weight: 157 lb 8 oz (71.442 kg) IBW/kg (Calculated) : 63.9  Temp (24hrs), Avg:98.3 F (36.8 C), Min:98.1 F (36.7 C), Max:98.7 F (37.1 C)   Recent Labs Lab 12/30/15 1513 12/31/15 0011 12/31/15 0945 01/01/16 0527 01/01/16 1919  WBC 9.0 8.8  --  8.7  --   CREATININE 0.57 0.56  --  <0.30*  --   LATICACIDVEN  --  0.7 0.6  --   --   VANCOTROUGH  --   --   --   --  5*    CrCl cannot be calculated (Patient has no serum creatinine result on file.).    Allergies  Allergen Reactions  . Amoxicillin-Pot Clavulanate Hives    Also vomiting  . Septra [Sulfamethoxazole-Trimethoprim] Hives and Nausea And Vomiting    Antimicrobials this admission: vancomycin  >>  meropenem  >>   Dose adjustments this admission: Vancomycin 1g q 12 to vanc 1g q 8hr  Microbiology results: 7/17 BCx: pending 7/17 UCx: pending  7/17 Sputum: pending    7/17 UA: pending 7/17 CXR: R atelectasis vs. infiltrate  Thank you for allowing pharmacy to be a part of this patient's care.  Ramond Dial 01/01/2016 8:12 PM

## 2016-01-02 ENCOUNTER — Encounter: Payer: Self-pay | Admitting: Radiology

## 2016-01-02 ENCOUNTER — Inpatient Hospital Stay: Payer: Medicaid Other

## 2016-01-02 LAB — CBC WITH DIFFERENTIAL/PLATELET
BASOS ABS: 0 10*3/uL (ref 0–0.1)
Basophils Relative: 0 %
EOS PCT: 0 %
Eosinophils Absolute: 0 10*3/uL (ref 0–0.7)
HEMATOCRIT: 30 % — AB (ref 35.0–47.0)
Hemoglobin: 10.3 g/dL — ABNORMAL LOW (ref 12.0–16.0)
Lymphocytes Relative: 4 %
Lymphs Abs: 0.4 10*3/uL — ABNORMAL LOW (ref 1.0–3.6)
MCH: 33.5 pg (ref 26.0–34.0)
MCHC: 34.2 g/dL (ref 32.0–36.0)
MCV: 98 fL (ref 80.0–100.0)
MONOS PCT: 3 %
Monocytes Absolute: 0.3 10*3/uL (ref 0.2–0.9)
NEUTROS PCT: 93 %
Neutro Abs: 8.2 10*3/uL — ABNORMAL HIGH (ref 1.4–6.5)
PLATELETS: 168 10*3/uL (ref 150–440)
RBC: 3.06 MIL/uL — AB (ref 3.80–5.20)
RDW: 15.5 % — AB (ref 11.5–14.5)
WBC: 8.9 10*3/uL (ref 3.6–11.0)

## 2016-01-02 LAB — BASIC METABOLIC PANEL
ANION GAP: 7 (ref 5–15)
BUN: 7 mg/dL (ref 6–20)
CALCIUM: 8.3 mg/dL — AB (ref 8.9–10.3)
CO2: 30 mmol/L (ref 22–32)
CREATININE: 0.39 mg/dL — AB (ref 0.44–1.00)
Chloride: 103 mmol/L (ref 101–111)
GLUCOSE: 168 mg/dL — AB (ref 65–99)
Potassium: 3.4 mmol/L — ABNORMAL LOW (ref 3.5–5.1)
Sodium: 140 mmol/L (ref 135–145)

## 2016-01-02 MED ORDER — NICOTINE 21 MG/24HR TD PT24
21.0000 mg | MEDICATED_PATCH | Freq: Every day | TRANSDERMAL | Status: DC
  Administered 2016-01-02 – 2016-01-11 (×10): 21 mg via TRANSDERMAL
  Filled 2016-01-02 (×10): qty 1

## 2016-01-02 MED ORDER — ACYCLOVIR 200 MG PO CAPS
800.0000 mg | ORAL_CAPSULE | Freq: Every day | ORAL | Status: DC
  Administered 2016-01-02 – 2016-01-05 (×14): 800 mg via ORAL
  Filled 2016-01-02 (×14): qty 4

## 2016-01-02 MED ORDER — POTASSIUM CHLORIDE CRYS ER 20 MEQ PO TBCR
40.0000 meq | EXTENDED_RELEASE_TABLET | ORAL | Status: AC
  Administered 2016-01-02: 11:00:00 40 meq via ORAL
  Filled 2016-01-02: qty 2

## 2016-01-02 MED ORDER — POTASSIUM CHLORIDE 20 MEQ PO PACK
40.0000 meq | PACK | Freq: Once | ORAL | Status: AC
  Administered 2016-01-02: 40 meq via ORAL
  Filled 2016-01-02: qty 2

## 2016-01-02 MED ORDER — ALTEPLASE 2 MG IJ SOLR
2.0000 mg | Freq: Once | INTRAMUSCULAR | Status: AC
Start: 2016-01-02 — End: 2016-01-02
  Administered 2016-01-02: 2 mg
  Filled 2016-01-02: qty 2

## 2016-01-02 MED ORDER — BENZONATATE 100 MG PO CAPS
100.0000 mg | ORAL_CAPSULE | ORAL | Status: DC | PRN
  Administered 2016-01-02 – 2016-01-09 (×13): 100 mg via ORAL
  Filled 2016-01-02 (×14): qty 1

## 2016-01-02 MED ORDER — IOPAMIDOL (ISOVUE-300) INJECTION 61%
75.0000 mL | Freq: Once | INTRAVENOUS | Status: AC | PRN
  Administered 2016-01-02: 11:00:00 75 mL via INTRAVENOUS

## 2016-01-02 NOTE — Progress Notes (Signed)
Charlene [name] at [place] NAME: Charlene [name]    MR#:  [id number]  DATE OF BIRTH:  1961-06-16  SUBJECTIVE:  CHIEF COMPLAINT:  No chief complaint on file.  Awake and sitting in bed today. Oriented  Has SOB and cough. Improving  1 unit PRBC transfused on 12/31/2015  REVIEW OF SYSTEMS:    Review of Systems  Constitutional: Positive for malaise/fatigue. Negative for fever and chills.  HENT: Negative for sore throat.   Eyes: Negative for blurred vision, double vision and pain.  Respiratory: Positive for cough and shortness of breath. Negative for hemoptysis and wheezing.   Cardiovascular: Negative for chest pain, palpitations, orthopnea and leg swelling.  Gastrointestinal: Negative for heartburn, nausea, vomiting, abdominal pain, diarrhea and constipation.  Genitourinary: Negative for dysuria and hematuria.  Musculoskeletal: Positive for back pain and joint pain.  Skin: Negative for rash.  Neurological: Positive for weakness. Negative for sensory change, speech change, focal weakness and headaches.  Endo/Heme/Allergies: Does not bruise/bleed easily.  Psychiatric/Behavioral: Negative for depression. The patient is nervous/anxious.     DRUG ALLERGIES:   Allergies  Allergen Reactions  . Amoxicillin-Pot Clavulanate Hives    Also vomiting  . Septra [Sulfamethoxazole-Trimethoprim] Hives and Nausea And Vomiting    VITALS:  Blood pressure 128/78, pulse 96, temperature 98.5 F (36.9 C), temperature source Oral, resp. rate 20, height '5\' 8"'$  (1.727 m), weight 71.442 kg (157 lb 8 oz), SpO2 98 %.  PHYSICAL EXAMINATION:   Physical Exam  GENERAL:  55 y.o.-year-old patient lying in the bed EYES: Pupils equal, round, reactive to light and accommodation. No scleral icterus. Extraocular muscles intact.  HEENT: Head atraumatic, normocephalic. Oropharynx and nasopharynx clear.  NECK:  Supple, no jugular venous distention. No thyroid enlargement,  no tenderness.  LUNGS: Normal breath sounds bilaterally, no wheezing, rales, rhonchi. No use of accessory muscles of respiration.Decreased breath sounds right lower lobe. Port-A-Cath in place CARDIOVASCULAR: S1, S2 normal. No murmurs, rubs, or gallops.  ABDOMEN: Soft, nontender, nondistended. Bowel sounds present. No organomegaly or mass.  EXTREMITIES: No cyanosis, clubbing or edema b/l.    NEUROLOGIC: Moves all 4 extremities PSYCHIATRIC: The patient is alert and awake  LABORATORY PANEL:   CBC  Recent Labs Lab 01/02/16 0420  WBC 8.9  HGB 10.3*  HCT 30.0*  PLT 168   ------------------------------------------------------------------------------------------------------------------ Chemistries   Recent Labs Lab 01/01/16 0527 01/02/16 0420  NA 137 140  K 3.4* 3.4*  CL 106 103  CO2 25 30  GLUCOSE 79 168*  BUN 7 7  CREATININE <0.30* 0.39*  CALCIUM 7.4* 8.3*  AST 24  --   ALT 19  --   ALKPHOS 70  --   BILITOT 0.3  --    ------------------------------------------------------------------------------------------------------------------  Cardiac Enzymes  Recent Labs Lab 12/31/15 0011  TROPONINI <0.03   ------------------------------------------------------------------------------------------------------------------  RADIOLOGY:  Dg Chest 2 View  01/01/2016  CLINICAL DATA:  Patient with hypoxia.  History of lung cancer. EXAM: CHEST  2 VIEW COMPARISON:  Chest radiograph 12/31/2015 FINDINGS: Right anterior chest wall Port-A-Cath is present with tip projecting over the superior cavoatrial junction. Left upper extremity PICC line tip projects over the superior vena cava. Stable cardiac and mediastinal contours. Re- demonstrated moderate right pleural effusion with underlying pulmonary consolidation. No pneumothorax. IMPRESSION: Stable support apparatus. Persistent moderate right pleural effusion and underlying pulmonary consolidation. Electronically Signed   By: Lovey Newcomer M.D.    On: 01/01/2016 13:20   Ct Head Wo Contrast  12/31/2015  CLINICAL DATA:  Altered level of consciousness. EXAM: CT HEAD WITHOUT CONTRAST TECHNIQUE: Contiguous axial images were obtained from the base of the skull through the vertex without intravenous contrast. COMPARISON:  CT scan of July 16, 2015. FINDINGS: Bilateral maxillary sinusitis is noted. Bony calvarium appears intact. No mass effect or midline shift is noted. Ventricular size is within normal limits. There is no evidence of mass lesion, hemorrhage or acute infarction. Mild chronic ischemic white matter disease is noted. IMPRESSION: Bilateral maxillary sinusitis. Mild chronic ischemic white matter disease. No acute intracranial abnormality seen. Electronically Signed   By: Marijo Conception, M.D.   On: 12/31/2015 15:09   Dg Chest Port 1 View  12/31/2015  CLINICAL DATA:  PICC line placement EXAM: PORTABLE CHEST 1 VIEW COMPARISON:  12/30/2015 FINDINGS: Left arm PICC terminates at the cavoatrial junction. Right chest port terminates at cavoatrial junction. Moderate right pleural effusion. Associated right lower lobe opacity, likely atelectasis. Left lung is clear. No pneumothorax. Heart is top-normal in size. IMPRESSION: Left arm PICC terminates at the cavoatrial junction. Otherwise unchanged. Electronically Signed   By: Julian Hy M.D.   On: 12/31/2015 18:37     ASSESSMENT AND PLAN:   * HCAP (healthcare-associated pneumonia) with Acute hypoxic respiratory failure and sepsis IV abx. Cultures no growth Nebs PRN Wean of O2 as tolerated Repeat CXR today shows persistent RLL infiltrate and pleural effusion. Will check CT scan of the chest today as patient has history of right-sided lung cancer and now has right lower lobe infiltrate and effusion. Pulmonary consult may be needed depending on CT scan results  * Acute encephalopathy - Likely multifactorial - improved With hypoxia, bipolar. CT head showed nothing acute   *Anemia-  Improved with 1 unit packed RBC transfused No blood in stool. Likely due to chemo and anemia of chronic disease.   *Herpes zoster - zoster-like dermatomal rash in her gluteal region.  On Acyclovir. Improving  *Small cell carcinoma of lung [facility]) Appreciate oncology input  *Bipolar I disorder, most recent episode manic, severe with psychotic features [facility]) Psychiatry consulted.  All the records are reviewed and case discussed with Care Management/Social Workerr. Management plans discussed with the patient, family and they are in agreement.  CODE STATUS: FULL CODE  DVT Prophylaxis: SCDs  TOTAL TIME TAKING CARE OF THIS PATIENT: 35 minutes.   POSSIBLE D/C IN 2-3 DAYS, DEPENDING ON CLINICAL CONDITION.  [name] R M.D on 01/02/2016 at 9:24 AM  Between 7am to 6pm - Pager - [phone]  After 6pm go to [email or web address] - password [email or web address] [facility]  Office  [phone]  CC: Primary care physician; Jiles Garter, MD  Note: This dictation was prepared with Dragon dictation along with smaller phrase technology. Any transcriptional errors that result from this process are unintentional.

## 2016-01-02 NOTE — Consult Note (Signed)
[facility] Consult   Reason for Consult:  Follow-up consult for this 55 year old woman with a history of bipolar disorder who was transferred from the psychiatric ward to medical service last night Referring Physician:  Sudini Patient Identification: Charlene [name] MRN:  [id number] Principal Diagnosis: HCAP (healthcare-associated pneumonia) Diagnosis:   Patient Active Problem List   Diagnosis Date Noted  . HCAP (healthcare-associated pneumonia) [J18.9] 12/31/2015  . Acute encephalopathy [G93.40] 12/31/2015  . Herpes zoster [B02.9] 12/31/2015  . Anemia [D64.9] 12/31/2015  . Acute psychosis associated with endocrine, metabolic, or cerebrovascular disorder [F06.8, F01.50] 12/21/2015  . Cocaine use disorder, moderate, dependence ([place]) [F14.20] 12/21/2015  . Bipolar I disorder, most recent episode manic, severe with psychotic features ([place]) [F31.2] 12/21/2015  . OCD (obsessive compulsive disorder) [F42.9] 12/21/2015  . PTSD (post-traumatic stress disorder) [F43.10] 12/21/2015  . Abnormal cells of cervix [N87.9] 07/30/2015  . Encounter for general adult medical examination without abnormal findings [Z00.00] 07/28/2015  . Primary cancer of right lower lobe of lung ([place]) [C34.31] 07/12/2015  . At risk for noncompliance [Z87.898] 06/29/2015  . Spasm [R25.2] 06/26/2015  . Essential (primary) hypertension [I10] 06/21/2015  . Lung mass [R91.8] 06/21/2015  . Candida infection of mouth [B37.0] 06/21/2015  . 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 Episodic mood disorder ([place]) [F39] 10/14/2014  . Chronic obstructive pulmonary disease ([place]) [J44.9] 10/06/2014  . Pulmonary emphysema ([place]) [J43.9] 10/06/2014  . LBP (low back pain) [M54.5] 09/27/2014  . Tobacco use disorder [F17.200] 09/27/2014    Total Time spent with patient: 20 minutes  Subjective:   Charlene [name] is a 55 y.o. female patient admitted with "I'm feeling better".  Follow-up Wednesday the 19th.  No new complaints psychiatrically. Having a little more nausea today and coughing. Also feeling physically a little stronger. Not depressed no suicidal thought doesn't appear to be disorganized in her thinking.  HPI:  55 year old woman with bipolar disorder and lung cancer. She had been admitted to the hospital psychiatrically recently for a manic episode. At the time of admission she was described as being hyperverbal hyperactive grandiose and delusional. She was being treated with antipsychotic and mood stabilizing medicine on the psychiatric ward. Had been hyperactive and agitated up until a couple days ago and then this weekend became sedated. Yesterday became extremely lethargic. Oxygen level dropped. Last night after a rapid response was called she was transferred to the medical service. On interview today the patient says she is feeling a little better. She no she is in the hospital and she is able to give me at least a vague answer indicating she know she was on the psychiatric ward. She says her mood is feeling okay. Denies hallucinations. Denies suicidal thoughts. She is still a little sluggish and out of bed and not able to answer in much more detail than that. Much more active however than she was yesterday.  Past Psychiatric History: Long history of bipolar disorder multiple prior hospitalizations for mania.  Risk to Self: Is patient at risk for suicide?: Nopast history of suicide attempts and agitation and poor self-care Risk to Others:  not threatening Prior Inpatient Therapy:  history of multiple prior inpatient treatments Prior Outpatient Therapy:  on history of bipolar disorder  Past Medical History:  Past Medical History  Diagnosis Date  . Primary cancer of right lower lobe of lung ([place]) 07/12/2015    Small cell undifferentiated carcinoma of lung.  Diagnosis at [facility] by fine-needle aspiration of lymph  node (January, 2017)  . Bipolar 2 disorder ([place])   . Borderline  schizophrenia   . Falls infrequently   . Asthma   . Hypertension     Past Surgical History  Procedure Laterality Date  . Abdominal hysterectomy    . Appendectomy    . Stomach surgery    . Nasal sinus surgery      x2  . Peripheral vascular catheterization N/A 07/30/2015    Procedure: Glori Luis Cath Insertion;  Surgeon: Algernon Huxley, MD;  Location: [facility];  Service: Cardiovascular;  Laterality: N/A;   Family History:  Family History  Problem Relation Age of Onset  . Hypertension Father    Family Psychiatric  History: Patient denies being aware of any Social History:  History  Alcohol Use No     History  Drug Use  . Yes  . Special: Cocaine    Comment: States due to cocaine in mouthwash    Social History   Social History  . Marital Status: Divorced    Spouse Name: N/A  . Number of Children: N/A  . Years of Education: N/A   Social History Main Topics  . Smoking status: Former Smoker -- 0.50 packs/day for 30 years    Quit date: 06/17/2015  . Smokeless tobacco: None  . Alcohol Use: No  . Drug Use: Yes    Special: Cocaine     Comment: States due to cocaine in mouthwash  . Sexual Activity: Not Asked   Other Topics Concern  . None   Social History Narrative   Additional Social History:    Allergies:   Allergies  Allergen Reactions  . Amoxicillin-Pot Clavulanate Hives    Also vomiting  . Septra [Sulfamethoxazole-Trimethoprim] Hives and Nausea And Vomiting    Labs:  Results for orders placed or performed during the hospital encounter of 12/30/15 (from the past 48 hour(s))  Hemoglobin     Status: Abnormal   Collection Time: 12/31/15  9:59 PM  Result Value Ref Range   Hemoglobin 8.9 (L) 12.0 - 16.0 g/dL  MRSA PCR Screening     Status: Abnormal   Collection Time: 01/01/16  3:35 AM  Result Value Ref Range   MRSA by PCR POSITIVE (A) NEGATIVE    Comment: RESULT CALLED TO, READ BACK BY AND VERIFIED WITH: KASEY VAUGHANS WARD ON 01/01/16 AT 0457 BY  [facility] The GeneXpert MRSA Assay (FDA approved for NASAL specimens only), is one component of a comprehensive MRSA colonization surveillance program. It is not intended to diagnose MRSA infection nor to guide or monitor treatment for MRSA infections.   CBC with Differential/Platelet     Status: Abnormal   Collection Time: 01/01/16  5:27 AM  Result Value Ref Range   WBC 8.7 3.6 - 11.0 K/uL   RBC 2.75 (L) 3.80 - 5.20 MIL/uL   Hemoglobin 9.2 (L) 12.0 - 16.0 g/dL   HCT 26.5 (L) 35.0 - 47.0 %   MCV 96.1 80.0 - 100.0 fL   MCH 33.4 26.0 - 34.0 pg   MCHC 34.7 32.0 - 36.0 g/dL   RDW 15.7 (H) 11.5 - 14.5 %   Platelets 137 (L) 150 - 440 K/uL   Neutrophils Relative % 90 %   Neutro Abs 7.8 (H) 1.4 - 6.5 K/uL   Lymphocytes Relative 4 %   Lymphs Abs 0.3 (L) 1.0 - 3.6 K/uL   Monocytes Relative 6 %   Monocytes Absolute 0.5 0.2 - 0.9 K/uL   Eosinophils Relative 0 %  Eosinophils Absolute 0.0 0 - 0.7 K/uL   Basophils Relative 0 %   Basophils Absolute 0.0 0 - 0.1 K/uL  Comprehensive metabolic panel     Status: Abnormal   Collection Time: 01/01/16  5:27 AM  Result Value Ref Range   Sodium 137 135 - 145 mmol/L   Potassium 3.4 (L) 3.5 - 5.1 mmol/L   Chloride 106 101 - 111 mmol/L   CO2 25 22 - 32 mmol/L   Glucose, Bld 79 65 - 99 mg/dL   BUN 7 6 - 20 mg/dL   Creatinine, Ser <0.30 (L) 0.44 - 1.00 mg/dL   Calcium 7.4 (L) 8.9 - 10.3 mg/dL   Total Protein 5.4 (L) 6.5 - 8.1 g/dL   Albumin 2.0 (L) 3.5 - 5.0 g/dL   AST 24 15 - 41 U/L   ALT 19 14 - 54 U/L   Alkaline Phosphatase 70 38 - 126 U/L   Total Bilirubin 0.3 0.3 - 1.2 mg/dL   GFR calc non Af Amer NOT CALCULATED >60 mL/min   GFR calc Af Amer NOT CALCULATED >60 mL/min    Comment: (NOTE) The eGFR has been calculated using the CKD EPI equation. This calculation has not been validated in all clinical situations. eGFR's persistently <60 mL/min signify possible Chronic Kidney Disease.    Anion gap 6 5 - 15  Vancomycin, trough     Status:  Abnormal   Collection Time: 01/01/16  7:19 PM  Result Value Ref Range   Vancomycin Tr 5 (L) 15 - 20 ug/mL  CBC with Differential/Platelet     Status: Abnormal   Collection Time: 01/02/16  4:20 AM  Result Value Ref Range   WBC 8.9 3.6 - 11.0 K/uL   RBC 3.06 (L) 3.80 - 5.20 MIL/uL   Hemoglobin 10.3 (L) 12.0 - 16.0 g/dL   HCT 30.0 (L) 35.0 - 47.0 %   MCV 98.0 80.0 - 100.0 fL   MCH 33.5 26.0 - 34.0 pg   MCHC 34.2 32.0 - 36.0 g/dL   RDW 15.5 (H) 11.5 - 14.5 %   Platelets 168 150 - 440 K/uL   Neutrophils Relative % 93 %   Lymphocytes Relative 4 %   Monocytes Relative 3 %   Eosinophils Relative 0 %   Basophils Relative 0 %   Neutro Abs 8.2 (H) 1.4 - 6.5 K/uL   Lymphs Abs 0.4 (L) 1.0 - 3.6 K/uL   Monocytes Absolute 0.3 0.2 - 0.9 K/uL   Eosinophils Absolute 0.0 0 - 0.7 K/uL   Basophils Absolute 0.0 0 - 0.1 K/uL   RBC Morphology MIXED RBC POPULATION     Comment: HYPOCHROMIA  Basic metabolic panel     Status: Abnormal   Collection Time: 01/02/16  4:20 AM  Result Value Ref Range   Sodium 140 135 - 145 mmol/L   Potassium 3.4 (L) 3.5 - 5.1 mmol/L   Chloride 103 101 - 111 mmol/L   CO2 30 22 - 32 mmol/L   Glucose, Bld 168 (H) 65 - 99 mg/dL   BUN 7 6 - 20 mg/dL   Creatinine, Ser 0.39 (L) 0.44 - 1.00 mg/dL   Calcium 8.3 (L) 8.9 - 10.3 mg/dL   GFR calc non Af Amer >60 >60 mL/min   GFR calc Af Amer >60 >60 mL/min    Comment: (NOTE) The eGFR has been calculated using the CKD EPI equation. This calculation has not been validated in all clinical situations. eGFR's persistently <60 mL/min signify possible Chronic Kidney Disease.  Anion gap 7 5 - 15    Current Facility-Administered Medications  Medication Dose Route Frequency Provider Last Rate Last Dose  . 0.9 %  sodium chloride infusion   Intravenous Once Lance Coon, MD      . acetaminophen (TYLENOL) tablet 650 mg  650 mg Oral Q6H PRN Lance Coon, MD   650 mg at 12/31/15 1009   Or  . acetaminophen (TYLENOL) suppository 650 mg   650 mg Rectal Q6H PRN Lance Coon, MD      . acyclovir (ZOVIRAX) 200 MG capsule 800 mg  800 mg Oral 5 X Daily Srikar Sudini, MD   800 mg at 01/02/16 1719  . alteplase (CATHFLO ACTIVASE) injection 2 mg  2 mg Intracatheter Once [employer], MD      . azithromycin (ZITHROMAX) 500 mg in dextrose 5 % 250 mL IVPB  500 mg Intravenous q1800 [name], MD   500 mg at 01/02/16 1720  . benzonatate (TESSALON) capsule 100 mg  100 mg Oral Q4H PRN [name], MD   100 mg at 01/02/16 1719  . Chlorhexidine Gluconate Cloth 2 % PADS 6 each  6 each Topical Q0600 Lance Coon, MD   6 each at 01/02/16 0600  . chlorpheniramine-HYDROcodone (TUSSIONEX) 10-8 MG/5ML suspension 5 mL  5 mL Oral Q12H Srikar Sudini, MD   5 mL at 01/02/16 0910  . diltiazem (CARDIZEM) tablet 30 mg  30 mg Oral Q12H [name], MD   30 mg at 01/02/16 0909  . divalproex (DEPAKOTE) DR tablet 500 mg  500 mg Oral Q8H Gonzella Lex, MD   500 mg at 01/02/16 1526  . HYDROcodone-acetaminophen (NORCO) 7.5-325 MG per tablet 1 tablet  1 tablet Oral Q6H PRN [name], MD   1 tablet at 01/02/16 1724  . ipratropium-albuterol (DUONEB) 0.5-2.5 (3) MG/3ML nebulizer solution 3 mL  3 mL Nebulization Q4H PRN [name], MD   3 mL at 01/02/16 1727  . meropenem (MERREM) 1 g in sodium chloride 0.9 % 100 mL IVPB  1 g Intravenous Q8H Lance Coon, MD   1 g at 01/02/16 1242  . methylPREDNISolone sodium succinate (SOLU-MEDROL) 125 mg/2 mL injection 60 mg  60 mg Intravenous Daily [name], MD   60 mg at 01/02/16 0927  . mupirocin ointment (BACTROBAN) 2 % 1 application  1 application Nasal BID Lance Coon, MD   1 application at 65/78/46 0911  . ondansetron (ZOFRAN) tablet 4 mg  4 mg Oral Q6H PRN Lance Coon, MD       Or  . ondansetron [facility]) injection 4 mg  4 mg Intravenous Q6H PRN Lance Coon, MD   4 mg at 01/02/16 1800  . pantoprazole (PROTONIX) 80 mg in sodium chloride 0.9 % 250 mL (0.32 mg/mL) infusion  8 mg/hr Intravenous Continuous Lance Coon, MD 25 mL/hr at 01/02/16 1056 8 mg/hr at 01/02/16 1056  . [START ON 01/03/2016] pantoprazole (PROTONIX) injection 40 mg  40 mg Intravenous Q12H Lance Coon, MD      . risperiDONE (RISPERDAL) tablet 1 mg  1 mg Oral BID Gonzella Lex, MD   1 mg at 01/02/16 0909  . sodium chloride flush (NS) 0.9 % injection 3 mL  3 mL Intravenous Q12H Lance Coon, MD   3 mL at 01/02/16 1000  . vancomycin (VANCOCIN) IVPB 1000 mg/200 mL premix  1,000 mg Intravenous Q8H Srikar Sudini, MD   1,000 mg at 01/02/16 1242    Musculoskeletal: Strength & Muscle Tone: decreased Gait &  Station: unable to stand Patient leans: N/A  Psychiatric Specialty Exam: Physical Exam  Nursing note and vitals reviewed. HENT:  Head: Normocephalic and atraumatic.  Eyes: Conjunctivae are normal. Pupils are equal, round, and reactive to light.  Neck: Normal range of motion.  Cardiovascular: Regular rhythm and normal heart sounds.   Respiratory: She is in respiratory distress.  GI: Soft.  Musculoskeletal: Normal range of motion.  Neurological: She is alert.  Skin: Skin is warm and dry.     Psychiatric: Her mood appears anxious. Her speech is delayed. She is slowed. Cognition and memory are impaired. She expresses impulsivity.    Review of Systems  Constitutional: Negative.   HENT: Negative.   Eyes: Negative.   Respiratory: Positive for cough, sputum production and shortness of breath.   Cardiovascular: Negative.   Gastrointestinal: Negative.   Musculoskeletal: Negative.   Skin: Negative.   Neurological: Negative.   Psychiatric/Behavioral: Positive for memory loss. Negative for depression, suicidal ideas, hallucinations and substance abuse. The patient is nervous/anxious and has insomnia.     Blood pressure 130/83, pulse 109, temperature 98.1 F (36.7 C), temperature source Oral, resp. rate 20, height '5\' 8"'$  (1.727 m), weight 71.442 kg (157 lb 8 oz), SpO2 100 %.Body mass index is 23.95 kg/(m^2).  General Appearance:  Disheveled  Eye Contact:  Minimal  Speech:  Garbled and Slow  Volume:  Decreased  Mood:  Euthymic  Affect:  Constricted  Thought Process:  Coherent  Orientation:  Full (Time, Place, and Person)  Thought Content:  Logical  Suicidal Thoughts:  No  Homicidal Thoughts:  No  Memory:  Immediate;   Fair Recent;   Fair Remote;   Fair  Judgement:  Fair  Insight:  Fair  Psychomotor Activity:  Decreased  Concentration:  Concentration: Fair  Recall:  [employer] of Knowledge:  Fair  Language:  Fair  Akathisia:  No  Handed:  Right  AIMS (if indicated):     Assets:  Financial Resources/Insurance Resilience Social Support  ADL's:  Impaired  Cognition:  Impaired,  Mild  Sleep:        Treatment Plan Summary: Daily contact with patient to assess and evaluate symptoms and progress in treatment, Medication management and Plan Patient seems to continue to be stable. Not depressed. Doesn't appear to be manic. Appears to be lucid in her thinking and calm. Tolerating medicine well. No changes in her medicine anticipated at this point. May be ready for discharge psychiatrically.  Disposition: Supportive therapy provided about ongoing stressors.  Alethia Berthold, MD 01/02/2016 6:06 PM

## 2016-01-03 LAB — BASIC METABOLIC PANEL
ANION GAP: 5 (ref 5–15)
BUN: 10 mg/dL (ref 6–20)
CALCIUM: 8.1 mg/dL — AB (ref 8.9–10.3)
CHLORIDE: 102 mmol/L (ref 101–111)
CO2: 32 mmol/L (ref 22–32)
Creatinine, Ser: 0.36 mg/dL — ABNORMAL LOW (ref 0.44–1.00)
GFR calc Af Amer: >60 mL/min (ref >60)
GFR calc non Af Amer: >60 mL/min (ref >60)
GLUCOSE: 132 mg/dL — AB (ref 65–99)
POTASSIUM: 4.3 mmol/L (ref 3.5–5.1)
Sodium: 139 mmol/L (ref 135–145)

## 2016-01-03 LAB — VANCOMYCIN, TROUGH: VANCOMYCIN TR: 12 ug/mL — AB (ref 15–20)

## 2016-01-03 LAB — OCCULT BLOOD X 1 CARD TO LAB, STOOL: Fecal Occult Bld: POSITIVE — AB

## 2016-01-03 MED ORDER — PREDNISONE 20 MG PO TABS
40.0000 mg | ORAL_TABLET | Freq: Every day | ORAL | Status: AC
  Administered 2016-01-04 – 2016-01-06 (×3): 40 mg via ORAL
  Filled 2016-01-03 (×3): qty 2

## 2016-01-03 MED ORDER — VANCOMYCIN HCL 10 G IV SOLR
1250.0000 mg | Freq: Three times a day (TID) | INTRAVENOUS | Status: DC
  Administered 2016-01-03: 1250 mg via INTRAVENOUS
  Filled 2016-01-03 (×2): qty 1250

## 2016-01-03 MED ORDER — TUBERCULIN PPD 5 UNIT/0.1ML ID SOLN
5.0000 [IU] | Freq: Once | INTRADERMAL | Status: AC
  Administered 2016-01-03: 5 [IU] via INTRADERMAL
  Filled 2016-01-03: qty 0.1

## 2016-01-03 MED ORDER — PANTOPRAZOLE SODIUM 40 MG PO TBEC
40.0000 mg | DELAYED_RELEASE_TABLET | Freq: Two times a day (BID) | ORAL | Status: DC
  Administered 2016-01-03 – 2016-01-11 (×16): 40 mg via ORAL
  Filled 2016-01-03 (×16): qty 1

## 2016-01-03 NOTE — Progress Notes (Signed)
Patient called for neb, no wheezes at this time.  After 2 minutes wearing neb, she took neb mask off and said "my body tells me when I've had enough".  RT attempted to explain to patient that she doesn't get full dose of med when she doesn't wear neb until mist stops.  Patient states her body lets her know that she doesn't need anymore.

## 2016-01-03 NOTE — Consult Note (Signed)
[facility] Consult   Reason for Consult:  Follow-up consult for this 55 year old woman with a history of bipolar disorder who was transferred from the psychiatric ward to medical service last night Referring Physician:  Sudini Patient Identification: Charlene [name] MRN:  [id number] Principal Diagnosis: HCAP (healthcare-associated pneumonia) Diagnosis:   Patient Active Problem List   Diagnosis Date Noted  . HCAP (healthcare-associated pneumonia) [J18.9] 12/31/2015  . Acute encephalopathy [G93.40] 12/31/2015  . Herpes zoster [B02.9] 12/31/2015  . Anemia [D64.9] 12/31/2015  . Acute psychosis associated with endocrine, metabolic, or cerebrovascular disorder [F06.8, F01.50] 12/21/2015  . Cocaine use disorder, moderate, dependence ([place]) [F14.20] 12/21/2015  . Bipolar I disorder, most recent episode manic, severe with psychotic features ([place]) [F31.2] 12/21/2015  . OCD (obsessive compulsive disorder) [F42.9] 12/21/2015  . PTSD (post-traumatic stress disorder) [F43.10] 12/21/2015  . Abnormal cells of cervix [N87.9] 07/30/2015  . Encounter for general adult medical examination without abnormal findings [Z00.00] 07/28/2015  . Primary cancer of right lower lobe of lung ([place]) [C34.31] 07/12/2015  . At risk for noncompliance [Z87.898] 06/29/2015  . Spasm [R25.2] 06/26/2015  . Essential (primary) hypertension [I10] 06/21/2015  . Lung mass [R91.8] 06/21/2015  . Candida infection of mouth [B37.0] 06/21/2015  . 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 Episodic mood disorder ([place]) [F39] 10/14/2014  . Chronic obstructive pulmonary disease ([place]) [J44.9] 10/06/2014  . Pulmonary emphysema ([place]) [J43.9] 10/06/2014  . LBP (low back pain) [M54.5] 09/27/2014  . Tobacco use disorder [F17.200] 09/27/2014    Total Time spent with patient: 20 minutes  Subjective:   Charlene [name] is a 55 y.o. female patient admitted with "I'm feeling better".  Follow-up Thursday the 20th.  Patient seen chart reviewed. She has been moved into a regular hospital bed and off of pulmonary precautions. Patient is still having a lot of coughing and says that she didn't sleep well last night because of it. Her mood today felt a little bit sad but there is no suicidal ideation and she does not appear to be psychotic. She seems to have a more rational grip on her situation. She appears to be tolerating her psychiatric medicine well. Denies any suicidal thoughts does not show evidence of acute psychosis.  HPI:  55 year old woman with bipolar disorder and lung cancer. She had been admitted to the hospital psychiatrically recently for a manic episode. At the time of admission she was described as being hyperverbal hyperactive grandiose and delusional. She was being treated with antipsychotic and mood stabilizing medicine on the psychiatric ward. Had been hyperactive and agitated up until a couple days ago and then this weekend became sedated. Yesterday became extremely lethargic. Oxygen level dropped. Last night after a rapid response was called she was transferred to the medical service. On interview today the patient says she is feeling a little better. She no she is in the hospital and she is able to give me at least a vague answer indicating she know she was on the psychiatric ward. She says her mood is feeling okay. Denies hallucinations. Denies suicidal thoughts. She is still a little sluggish and out of bed and not able to answer in much more detail than that. Much more active however than she was yesterday.  Past Psychiatric History: Long history of bipolar disorder multiple prior hospitalizations for mania.  Risk to Self: Is patient at risk for suicide?: Nopast history of suicide attempts and agitation and poor self-care Risk to Others:  not threatening Prior Inpatient  Therapy:  history of multiple prior inpatient treatments Prior Outpatient Therapy:  on history of bipolar disorder  Past  Medical History:  Past Medical History  Diagnosis Date  . Primary cancer of right lower lobe of lung ([place]) 07/12/2015    Small cell undifferentiated carcinoma of lung.  Diagnosis at [facility] by fine-needle aspiration of lymph node (January, 2017)  . Bipolar 2 disorder ([place])   . Borderline schizophrenia   . Falls infrequently   . Asthma   . Hypertension     Past Surgical History  Procedure Laterality Date  . Abdominal hysterectomy    . Appendectomy    . Stomach surgery    . Nasal sinus surgery      x2  . Peripheral vascular catheterization N/A 07/30/2015    Procedure: Glori Luis Cath Insertion;  Surgeon: Algernon Huxley, MD;  Location: [facility];  Service: Cardiovascular;  Laterality: N/A;   Family History:  Family History  Problem Relation Age of Onset  . Hypertension Father    Family Psychiatric  History: Patient denies being aware of any Social History:  History  Alcohol Use No     History  Drug Use  . Yes  . Special: Cocaine    Comment: States due to cocaine in mouthwash    Social History   Social History  . Marital Status: Divorced    Spouse Name: N/A  . Number of Children: N/A  . Years of Education: N/A   Social History Main Topics  . Smoking status: Former Smoker -- 0.50 packs/day for 30 years    Quit date: 06/17/2015  . Smokeless tobacco: None  . Alcohol Use: No  . Drug Use: Yes    Special: Cocaine     Comment: States due to cocaine in mouthwash  . Sexual Activity: Not Asked   Other Topics Concern  . None   Social History Narrative   Additional Social History:    Allergies:   Allergies  Allergen Reactions  . Amoxicillin-Pot Clavulanate Hives    Also vomiting  . Septra [Sulfamethoxazole-Trimethoprim] Hives and Nausea And Vomiting    Labs:  Results for orders placed or performed during the hospital encounter of 12/30/15 (from the past 48 hour(s))  Vancomycin, trough     Status: Abnormal   Collection Time: 01/01/16  7:19 PM  Result  Value Ref Range   Vancomycin Tr 5 (L) 15 - 20 ug/mL  CBC with Differential/Platelet     Status: Abnormal   Collection Time: 01/02/16  4:20 AM  Result Value Ref Range   WBC 8.9 3.6 - 11.0 K/uL   RBC 3.06 (L) 3.80 - 5.20 MIL/uL   Hemoglobin 10.3 (L) 12.0 - 16.0 g/dL   HCT 30.0 (L) 35.0 - 47.0 %   MCV 98.0 80.0 - 100.0 fL   MCH 33.5 26.0 - 34.0 pg   MCHC 34.2 32.0 - 36.0 g/dL   RDW 15.5 (H) 11.5 - 14.5 %   Platelets 168 150 - 440 K/uL   Neutrophils Relative % 93 %   Lymphocytes Relative 4 %   Monocytes Relative 3 %   Eosinophils Relative 0 %   Basophils Relative 0 %   Neutro Abs 8.2 (H) 1.4 - 6.5 K/uL   Lymphs Abs 0.4 (L) 1.0 - 3.6 K/uL   Monocytes Absolute 0.3 0.2 - 0.9 K/uL   Eosinophils Absolute 0.0 0 - 0.7 K/uL   Basophils Absolute 0.0 0 - 0.1 K/uL   RBC Morphology MIXED RBC  POPULATION     Comment: HYPOCHROMIA  Basic metabolic panel     Status: Abnormal   Collection Time: 01/02/16  4:20 AM  Result Value Ref Range   Sodium 140 135 - 145 mmol/L   Potassium 3.4 (L) 3.5 - 5.1 mmol/L   Chloride 103 101 - 111 mmol/L   CO2 30 22 - 32 mmol/L   Glucose, Bld 168 (H) 65 - 99 mg/dL   BUN 7 6 - 20 mg/dL   Creatinine, Ser 0.39 (L) 0.44 - 1.00 mg/dL   Calcium 8.3 (L) 8.9 - 10.3 mg/dL   GFR calc non Af Amer >60 >60 mL/min   GFR calc Af Amer >60 >60 mL/min    Comment: (NOTE) The eGFR has been calculated using the CKD EPI equation. This calculation has not been validated in all clinical situations. eGFR's persistently <60 mL/min signify possible Chronic Kidney Disease.    Anion gap 7 5 - 15  Occult blood card to lab, stool RN will collect     Status: Abnormal   Collection Time: 01/02/16  7:06 AM  Result Value Ref Range   Fecal Occult Bld POSITIVE (A) NEGATIVE  Basic metabolic panel     Status: Abnormal   Collection Time: 01/03/16  3:40 AM  Result Value Ref Range   Sodium 139 135 - 145 mmol/L   Potassium 4.3 3.5 - 5.1 mmol/L   Chloride 102 101 - 111 mmol/L   CO2 32 22 - 32  mmol/L   Glucose, Bld 132 (H) 65 - 99 mg/dL   BUN 10 6 - 20 mg/dL   Creatinine, Ser 0.36 (L) 0.44 - 1.00 mg/dL   Calcium 8.1 (L) 8.9 - 10.3 mg/dL   GFR calc non Af Amer >60 >60 mL/min   GFR calc Af Amer >60 >60 mL/min    Comment: (NOTE) The eGFR has been calculated using the CKD EPI equation. This calculation has not been validated in all clinical situations. eGFR's persistently <60 mL/min signify possible Chronic Kidney Disease.    Anion gap 5 5 - 15  Vancomycin, trough     Status: Abnormal   Collection Time: 01/03/16  3:45 AM  Result Value Ref Range   Vancomycin Tr 12 (L) 15 - 20 ug/mL    Current Facility-Administered Medications  Medication Dose Route Frequency Provider Last Rate Last Dose  . 0.9 %  sodium chloride infusion   Intravenous Once Lance Coon, MD      . acetaminophen (TYLENOL) tablet 650 mg  650 mg Oral Q6H PRN Lance Coon, MD   650 mg at 12/31/15 1009   Or  . acetaminophen (TYLENOL) suppository 650 mg  650 mg Rectal Q6H PRN Lance Coon, MD      . acyclovir (ZOVIRAX) 200 MG capsule 800 mg  800 mg Oral 5 X Daily Srikar Sudini, MD   800 mg at 01/03/16 1728  . azithromycin (ZITHROMAX) 500 mg in dextrose 5 % 250 mL IVPB  500 mg Intravenous q1800 [name], MD   500 mg at 01/03/16 1728  . benzonatate (TESSALON) capsule 100 mg  100 mg Oral Q4H PRN [name], MD   100 mg at 01/03/16 1222  . Chlorhexidine Gluconate Cloth 2 % PADS 6 each  6 each Topical Q0600 Lance Coon, MD   6 each at 01/03/16 [phone]  . chlorpheniramine-HYDROcodone (TUSSIONEX) 10-8 MG/5ML suspension 5 mL  5 mL Oral Q12H Srikar Sudini, MD   5 mL at 01/03/16 0855  . diltiazem (CARDIZEM) tablet 30 mg  30 mg Oral Q12H [name], MD   30 mg at 01/03/16 0857  . divalproex (DEPAKOTE) DR tablet 500 mg  500 mg Oral Q8H Gonzella Lex, MD   500 mg at 01/03/16 1412  . HYDROcodone-acetaminophen (NORCO) 7.5-325 MG per tablet 1 tablet  1 tablet Oral Q6H PRN [name], MD   1 tablet at 01/03/16 0546  .  ipratropium-albuterol (DUONEB) 0.5-2.5 (3) MG/3ML nebulizer solution 3 mL  3 mL Nebulization Q4H PRN [name], MD   3 mL at 01/03/16 1450  . meropenem (MERREM) 1 g in sodium chloride 0.9 % 100 mL IVPB  1 g Intravenous Q8H Lance Coon, MD   1 g at 01/03/16 1209  . mupirocin ointment (BACTROBAN) 2 % 1 application  1 application Nasal BID Lance Coon, MD   1 application at 96/22/29 0857  . nicotine (NICODERM CQ - dosed in mg/24 hours) patch 21 mg  21 mg Transdermal Daily Alexis Hugelmeyer, DO   21 mg at 01/03/16 0856  . ondansetron (ZOFRAN) tablet 4 mg  4 mg Oral Q6H PRN Lance Coon, MD   4 mg at 01/03/16 1544   Or  . ondansetron [facility]) injection 4 mg  4 mg Intravenous Q6H PRN Lance Coon, MD   4 mg at 01/02/16 1800  . pantoprazole (PROTONIX) EC tablet 40 mg  40 mg Oral BID AC Srikar Sudini, MD   40 mg at 01/03/16 1728  . [START ON 01/04/2016] predniSONE (DELTASONE) tablet 40 mg  40 mg Oral Q breakfast Srikar Sudini, MD      . risperiDONE (RISPERDAL) tablet 1 mg  1 mg Oral BID Gonzella Lex, MD   1 mg at 01/03/16 0856  . sodium chloride flush (NS) 0.9 % injection 3 mL  3 mL Intravenous Q12H Lance Coon, MD   3 mL at 01/03/16 0858  . tuberculin injection 5 Units  5 Units Intradermal Once [name], MD   5 Units at 01/03/16 1233    Musculoskeletal: Strength & Muscle Tone: decreased Gait & Station: unable to stand Patient leans: N/A  Psychiatric Specialty Exam: Physical Exam  Nursing note and vitals reviewed. HENT:  Head: Normocephalic and atraumatic.  Eyes: Conjunctivae are normal. Pupils are equal, round, and reactive to light.  Neck: Normal range of motion.  Cardiovascular: Regular rhythm and normal heart sounds.   Respiratory: She is in respiratory distress.  GI: Soft.  Musculoskeletal: Normal range of motion.  Neurological: She is alert.  Skin: Skin is warm and dry.     Psychiatric: Her speech is normal. Her mood appears anxious. She is not slowed. Thought content is  not paranoid. Cognition and memory are impaired. She does not express impulsivity. She expresses no suicidal ideation.    Review of Systems  Constitutional: Negative.   HENT: Negative.   Eyes: Negative.   Respiratory: Positive for cough, sputum production and shortness of breath.   Cardiovascular: Negative.   Gastrointestinal: Negative.   Musculoskeletal: Negative.   Skin: Negative.   Neurological: Negative.   Psychiatric/Behavioral: Positive for memory loss. Negative for depression, suicidal ideas, hallucinations and substance abuse. The patient has insomnia. The patient is not nervous/anxious.     Blood pressure 148/97, pulse 94, temperature 98.3 F (36.8 C), temperature source Oral, resp. rate 18, height 5' 8" (1.727 m), weight 71.442 kg (157 lb 8 oz), SpO2 100 %.Body mass index is 23.95 kg/(m^2).  General Appearance: Disheveled  Eye Contact:  Minimal  Speech:  Garbled and Slow  Volume:  Decreased  Mood:  Euthymic  Affect:  Constricted  Thought Process:  Coherent  Orientation:  Full (Time, Place, and Person)  Thought Content:  Logical  Suicidal Thoughts:  No  Homicidal Thoughts:  No  Memory:  Immediate;   Fair Recent;   Fair Remote;   Fair  Judgement:  Fair  Insight:  Fair  Psychomotor Activity:  Decreased  Concentration:  Concentration: Fair  Recall:  [employer] of Knowledge:  Fair  Language:  Fair  Akathisia:  No  Handed:  Right  AIMS (if indicated):     Assets:  Financial Resources/Insurance Resilience Social Support  ADL's:  Impaired  Cognition:  Impaired,  Mild  Sleep:        Treatment Plan Summary: Daily contact with patient to assess and evaluate symptoms and progress in treatment, Medication management and Plan Greatly improved and her mood appears to be stabilized. Currently on Depakote and risperidone decreased doses compared to earlier in her hospital stay. I have ordered a Depakote blood level for tomorrow morning. Patient had not been receiving  psychiatric treatment specifically before coming into the hospital and will need to resume that at discharge. Continue medicine for now. Explained treatment plan to patient who is agreeable.  Disposition: Supportive therapy provided about ongoing stressors.  Alethia Berthold, MD 01/03/2016 5:52 PM

## 2016-01-03 NOTE — Progress Notes (Signed)
Pharmacy Antibiotic Note  Charlene [name] is a 55 y.o. female admitted on 12/30/2015 with pneumonia.  Pharmacy has been consulted for vancomycin and meropeenm dosing.  Plan: DW 67.6kg  Vd 47L kei 0.072 hr-1  T1/2 10 hours Vancomycin 1 gram q 12 hours ordered with stacked dosing. Level before 5th dose. Goal trough 15-20..  Meropenem 1 gram q 8 hours ordered.    5/18 Trough results =5 Spoke with RN who is unaware of any missed doses/incompelte doses. All doses were charted. Will increase dose to vancomycin 1g q 8 hours. Recheck trough prior to the 4th new dose. 7/20 @ 0345  7/20 0345 trough 12 mcg/mL. Increase to vancomycin 1.25 gm IV Q8H, will recheck level 7/21 before evening dose.  Height: '5\' 8"'$  (172.7 cm) Weight: 157 lb 8 oz (71.442 kg) IBW/kg (Calculated) : 63.9  Temp (24hrs), Avg:98.3 F (36.8 C), Min:97.7 F (36.5 C), Max:98.8 F (37.1 C)   Recent Labs Lab 12/30/15 1513 12/31/15 0011 12/31/15 0945 01/01/16 0527 01/01/16 1919 01/02/16 0420 01/03/16 0345  WBC 9.0 8.8  --  8.7  --  8.9  --   CREATININE 0.57 0.56  --  <0.30*  --  0.39*  --   LATICACIDVEN  --  0.7 0.6  --   --   --   --   VANCOTROUGH  --   --   --   --  5*  --  12*    Estimated Creatinine Clearance: 81.1 mL/min (by C-G formula based on Cr of 0.39).    Allergies  Allergen Reactions  . Amoxicillin-Pot Clavulanate Hives    Also vomiting  . Septra [Sulfamethoxazole-Trimethoprim] Hives and Nausea And Vomiting    Antimicrobials this admission: vancomycin  >>  meropenem  >>   Dose adjustments this admission: Vancomycin 1g q 12 to vanc 1g q 8hr  Microbiology results: 7/17 BCx: pending 7/17 UCx: pending  7/17 Sputum: pending    7/17 UA: pending 7/17 CXR: R atelectasis vs. infiltrate  Thank you for allowing pharmacy to be a part of this patient's care.  Laural Benes, Pharm.D., BCPS Clinical Pharmacist 01/03/2016 4:33 AM

## 2016-01-03 NOTE — Plan of Care (Signed)
Problem: Health Behavior/Discharge Planning: Goal: Ability to manage health-related needs will improve Outcome: Not Progressing Patient with sitter this shift and continuing manic behaviors.  Patient frequently out of bed and changing outfits or washing hands in room sink.  Patient refused to keep telemetry leads on even with repeated education.  RN requested D/C of telemetry due to patient non-compliance.

## 2016-01-03 NOTE — Progress Notes (Signed)
The patient was found to have heme positive stools but her hemoglobin has been stable.  She is not a candidate for any Endoscopic procedures at this time.  I will sign off.  Please call if any further GI concerns or questions.  We would like to thank you for the opportunity to participate in the care of Charlene [name].

## 2016-01-03 NOTE — Progress Notes (Signed)
[facility] [place] NAME: Charlene [name]    MR#:  [id number]  DATE OF BIRTH:  1960-07-09  SUBJECTIVE:  CHIEF COMPLAINT:  No chief complaint on file.   Feels better Has SOB and cough. Improving  1 unit PRBC transfused on 12/31/2015  REVIEW OF SYSTEMS:    Review of Systems  Constitutional: Positive for malaise/fatigue. Negative for fever and chills.  HENT: Negative for sore throat.   Eyes: Negative for blurred vision, double vision and pain.  Respiratory: Positive for cough and shortness of breath. Negative for hemoptysis and wheezing.   Cardiovascular: Negative for chest pain, palpitations, orthopnea and leg swelling.  Gastrointestinal: Negative for heartburn, nausea, vomiting, abdominal pain, diarrhea and constipation.  Genitourinary: Negative for dysuria and hematuria.  Musculoskeletal: Positive for back pain and joint pain.  Skin: Negative for rash.  Neurological: Positive for weakness. Negative for sensory change, speech change, focal weakness and headaches.  Endo/Heme/Allergies: Does not bruise/bleed easily.  Psychiatric/Behavioral: Negative for depression. The patient is nervous/anxious.     DRUG ALLERGIES:   Allergies  Allergen Reactions  . Amoxicillin-Pot Clavulanate Hives    Also vomiting  . Septra [Sulfamethoxazole-Trimethoprim] Hives and Nausea And Vomiting    VITALS:  Blood pressure 135/83, pulse 93, temperature 98.8 F (37.1 C), temperature source Oral, resp. rate 20, height '5\' 8"'$  (1.727 m), weight 71.442 kg (157 lb 8 oz), SpO2 98 %.  PHYSICAL EXAMINATION:   Physical Exam  GENERAL:  55 y.o.-year-old patient lying in the bed EYES: Pupils equal, round, reactive to light and accommodation. No scleral icterus. Extraocular muscles intact.  HEENT: Head atraumatic, normocephalic. Oropharynx and nasopharynx clear.  NECK:  Supple, no jugular venous distention. No thyroid enlargement, no tenderness.  LUNGS:  Normal breath sounds bilaterally, no wheezing, rales, rhonchi. No use of accessory muscles of respiration.Decreased breath sounds right lower lobe. Port-A-Cath in place CARDIOVASCULAR: S1, S2 normal. No murmurs, rubs, or gallops.  ABDOMEN: Soft, nontender, nondistended. Bowel sounds present. No organomegaly or mass.  EXTREMITIES: No cyanosis, clubbing or edema b/l.    NEUROLOGIC: Moves all 4 extremities PSYCHIATRIC: The patient is alert and awake Maculopapular crusted rash left gluteus  LABORATORY PANEL:   CBC  Recent Labs Lab 01/02/16 0420  WBC 8.9  HGB 10.3*  HCT 30.0*  PLT 168   ------------------------------------------------------------------------------------------------------------------ Chemistries   Recent Labs Lab 01/01/16 0527  01/03/16 0340  NA 137  < > 139  K 3.4*  < > 4.3  CL 106  < > 102  CO2 25  < > 32  GLUCOSE 79  < > 132*  BUN 7  < > 10  CREATININE <0.30*  < > 0.36*  CALCIUM 7.4*  < > 8.1*  AST 24  --   --   ALT 19  --   --   ALKPHOS 70  --   --   BILITOT 0.3  --   --   < > = values in this interval not displayed. ------------------------------------------------------------------------------------------------------------------  Cardiac Enzymes  Recent Labs Lab 12/31/15 0011  TROPONINI <0.03   ------------------------------------------------------------------------------------------------------------------  RADIOLOGY:  Ct Chest W Contrast  01/02/2016  CLINICAL DATA:  Right lower lower pneumonia with pleural effusion, history of right lower lobe mass EXAM: CT CHEST WITH CONTRAST TECHNIQUE: Multidetector CT imaging of the chest was performed during intravenous contrast administration. CONTRAST:  50m ISOVUE-300 IOPAMIDOL (ISOVUE-300) INJECTION 61% COMPARISON:  10/29/2015 and 07/16/2015 FINDINGS: Mediastinum/Lymph Nodes: Central airways are patent. Right IJ Port-A-Cath in  place with tip in distal SVC. Left PICC line in place with tip in distal SVC.  No mediastinal hematoma or adenopathy. No hilar adenopathy is noted. Heart size within normal limits. No pericardial effusion. Lungs/Pleura: Small bilateral pleural effusion. There is central mild interstitial prominence and some hazy ground-glass parenchymal attenuation with geographic appearance in upper lobes. Findings highly suspicious for mild interstitial edema. There is patchy infiltrate in left lower lobe posteriorly. Triangular-shaped consolidation is noted in right upper lobe posteriorly with some air bronchogram. There is some cystic bronchiectasis within consolidation. Findings are highly suspicious for pneumonia. No definite bronchial mass of bronchial obstruction is noted. Subtle mild patchy infiltrate is noted in right lower lobe centrally. Upper abdomen: The visualized upper abdomen shows no adrenal gland mass. Mild intrahepatic biliary ductal dilatation. Gallbladder is contracted without evidence of calcified gallstones. Visualized pancreas and spleen is unremarkable. Visualized upper kidneys are unremarkable. Partially visualized postsurgical changes distal stomach. Musculoskeletal: No destructive bony lesions are noted. Osteopenia and mild degenerative changes thoracolumbar spine. Sagittal view of the sternum is unremarkable. IMPRESSION: 1. Bilateral small pleural effusion is noted. There is triangular-shaped consolidation with air bronchogram in right upper lobe posteriorly. Some cystic bronchiectasis is noted within consolidation. Additional patchy infiltrate is noted bilateral lower lobe. Findings highly suspicious for multifocal pneumonia. Followup CT of the chest is recommended in 3-4 weeks following trial of antibiotic therapy to ensure resolution and exclude underlying malignancy. 2. There is mild interstitial prominence centrally bilateral upper lobe with some patchy parenchymal geographic central ground glass attenuation. Findings suspicious for mild pulmonary edema or pneumonia bilateral  upper lobe. 3. No mediastinal or hilar adenopathy. 4. Right Port-A-Cath and left PICC line in place. 5. Mild degenerative change thoracic spine. Electronically Signed   By: Lahoma Crocker M.D.   On: 01/02/2016 11:14     ASSESSMENT AND PLAN:   * HCAP (healthcare-associated pneumonia) with Acute hypoxic respiratory failure and sepsis IV abx. Cultures no growth Nebs PRN Wean of O2 as tolerated CT chest shows no masses. B/L infiltrates.  * Acute encephalopathy - Likely multifactorial - improved With hypoxia, bipolar. CT head showed nothing acute   *Anemia- Improved with 1 unit packed RBC transfused Likely due to chemo and anemia of chronic disease.   *Herpes zoster - zoster-like dermatomal rash in her gluteal region.  On Acyclovir. Improving Crusted and dry. D/C airborne precautions  *Small cell carcinoma of lung (HCC) Appreciate oncology input  *Bipolar I disorder, most recent episode manic, severe with psychotic features [facility]) Psychiatry consulted.  All the records are reviewed and case discussed with Care Management/Social Workerr. Management plans discussed with the patient, family and they are in agreement.  CODE STATUS: FULL CODE  DVT Prophylaxis: SCDs  TOTAL TIME TAKING CARE OF THIS PATIENT: 35 minutes.   POSSIBLE D/C IN 2-3 DAYS, DEPENDING ON CLINICAL CONDITION.  [name] R M.D on 01/03/2016 at 11:45 AM  Between 7am to 6pm - Pager - [phone]  After 6pm go to [email or web address] - password [email or web address] [facility]  Office  [phone]  CC: Primary care physician; Jiles Garter, MD  Note: This dictation was prepared with Dragon dictation along with smaller phrase technology. Any transcriptional errors that result from this process are unintentional.

## 2016-01-04 LAB — BASIC METABOLIC PANEL
Anion gap: 4 — ABNORMAL LOW (ref 5–15)
BUN: 15 mg/dL (ref 6–20)
CO2: 34 mmol/L — ABNORMAL HIGH (ref 22–32)
CREATININE: 0.5 mg/dL (ref 0.44–1.00)
Calcium: 8.3 mg/dL — ABNORMAL LOW (ref 8.9–10.3)
Chloride: 102 mmol/L (ref 101–111)
GFR calc Af Amer: >60 mL/min (ref >60)
Glucose, Bld: 133 mg/dL — ABNORMAL HIGH (ref 65–99)
Potassium: 4.2 mmol/L (ref 3.5–5.1)
SODIUM: 140 mmol/L (ref 135–145)

## 2016-01-04 LAB — HEMOGLOBIN: HEMOGLOBIN: 9.8 g/dL — AB (ref 12.0–16.0)

## 2016-01-04 LAB — VALPROIC ACID LEVEL: Valproic Acid Lvl: 14 ug/mL — ABNORMAL LOW (ref 50.0–100.0)

## 2016-01-04 MED ORDER — LEVOFLOXACIN 500 MG PO TABS
500.0000 mg | ORAL_TABLET | Freq: Every day | ORAL | Status: DC
  Administered 2016-01-04 – 2016-01-09 (×6): 500 mg via ORAL
  Filled 2016-01-04 (×6): qty 1

## 2016-01-04 MED ORDER — DIVALPROEX SODIUM 500 MG PO DR TAB
1000.0000 mg | DELAYED_RELEASE_TABLET | Freq: Two times a day (BID) | ORAL | Status: DC
  Administered 2016-01-04 – 2016-01-11 (×14): 1000 mg via ORAL
  Filled 2016-01-04 (×14): qty 2

## 2016-01-04 NOTE — Clinical Social Work Note (Signed)
Clinical Social Work Assessment  Patient Details  Name: Charlene [name] MRN: [id number] Date of Birth: 1960/06/25  Date of referral:  01/04/16               Reason for consult:  Facility Placement                Permission sought to share information with:  Family Supports Permission granted to share information::  Yes, Verbal Permission Granted  Name::     Olegario Shearer  Agency::     Relationship::  father  Contact Information:  [phone]  Housing/Transportation Living arrangements for the past 2 months:  Single Family Home Source of Information:  Patient, Other (Comment Required) (Parent) Patient Interpreter Needed:  None Criminal Activity/Legal Involvement Pertinent to Current Situation/Hospitalization:  No - Comment as needed Significant Relationships:  Adult Children, Parents Lives with:  Adult Children, Parents Do you feel safe going back to the place where you live?  No (Pt needs a higher level of care.) Need for family participation in patient care:  No (Coment)  Care giving concerns:  Pt is in need of a higher level of care.   Social Worker assessment / plan:  CSW met with pt to address consult for New ALF. CSW introduced herself and explained role of social work. CSW also explained process of discharging to ALF. Pt shared that she has Medicaid and was approved for disability, however does not know what amount her income is. Pt is in agreement for ALF placement as it is not ideal for pt to return home. Pt lives with her parents and they are not abe to care for her. Pt is open to facilities meeting with her for an evaluation. CSW updated pt's father. PPD has been placed and will be read tomorrow. PASARR is pending and an evaluator will be coming to evaluate pt while she is inpatient.   CSW sent referral out to ALFs for review. CSW will follow up with bed offers. CSW will continue to follow.  Employment status:  Disabled (Comment on whether or not currently receiving Disability)  (approved 7/18/217, per pt report) Insurance information:  Medicaid In [place] PT Recommendations:  Not assessed at this time Information / Referral to community resources:  Other (Comment Required) (Assisted Living Facilities in [facility])  Patient/Family's Response to care:  Pt was appreciative of CSW support.   Patient/Family's Understanding of and Emotional Response to Diagnosis, Current Treatment, and Prognosis:  Pt understands that she would benefit from a higher level of care.   Emotional Assessment Appearance:  Appears stated age Attitude/Demeanor/Rapport:  Other (Approrpriate) Affect (typically observed):  Accepting, Adaptable, Pleasant Orientation:  Oriented to Self, Oriented to Place, Oriented to  Time, Oriented to Situation Alcohol / Substance use:  Tobacco Use (history of cocaine, per medical record) Psych involvement (Current and /or in the community):  Yes (Comment) (BMU Admission and Psych following)  Discharge Needs  Concerns to be addressed:  Adjustment to Illness, Discharge Planning Concerns, Financial / Insurance Concerns Readmission within the last 30 days:  No Current discharge risk:  Psychiatric Illness Barriers to Discharge:  [place] ([place])   Darden Dates, LCSW 01/04/2016, 4:10 PM

## 2016-01-04 NOTE — Progress Notes (Signed)
[place] at [place] NAME: Charlene [name]    MR#:  [id number]  DATE OF BIRTH:  12/13/1960  SUBJECTIVE:  CHIEF COMPLAINT:  No chief complaint on file.   Feels better. PPD  Test started yesterday  REVIEW OF SYSTEMS:    Review of Systems  Constitutional: Positive for malaise/fatigue. Negative for fever and chills.  HENT: Negative for sore throat.   Eyes: Negative for blurred vision, double vision and pain.  Respiratory: Positive for cough and shortness of breath. Negative for hemoptysis and wheezing.   Cardiovascular: Negative for chest pain, palpitations, orthopnea and leg swelling.  Gastrointestinal: Negative for heartburn, nausea, vomiting, abdominal pain, diarrhea and constipation.  Genitourinary: Negative for dysuria and hematuria.  Musculoskeletal: Positive for back pain and joint pain.  Skin: Negative for rash.  Neurological: Positive for weakness. Negative for sensory change, speech change, focal weakness and headaches.  Endo/Heme/Allergies: Does not bruise/bleed easily.  Psychiatric/Behavioral: Negative for depression. The patient is nervous/anxious.    DRUG ALLERGIES:   Allergies  Allergen Reactions  . Amoxicillin-Pot Clavulanate Hives    Also vomiting  . Septra [Sulfamethoxazole-Trimethoprim] Hives and Nausea And Vomiting   VITALS:  Blood pressure 136/94, pulse 66, temperature 98.4 F (36.9 C), temperature source Oral, resp. rate 18, height '5\' 8"'$  (1.727 m), weight 71.442 kg (157 lb 8 oz), SpO2 98 %.  PHYSICAL EXAMINATION:   Physical Exam  GENERAL:  55 y.o.-year-old patient lying in the bed EYES: Pupils equal, round, reactive to light and accommodation. No scleral icterus. Extraocular muscles intact.  HEENT: Head atraumatic, normocephalic. Oropharynx and nasopharynx clear.  NECK:  Supple, no jugular venous distention. No thyroid enlargement, no tenderness.  LUNGS: Normal breath sounds bilaterally, no wheezing,  rales, rhonchi. No use of accessory muscles of respiration. Port-A-Cath in place CARDIOVASCULAR: S1, S2 normal. No murmurs, rubs, or gallops.  ABDOMEN: Soft, nontender, nondistended. Bowel sounds present. No organomegaly or mass.  EXTREMITIES: No cyanosis, clubbing or edema b/l.    NEUROLOGIC: Moves all 4 extremities PSYCHIATRIC: The patient is alert and awake  LABORATORY PANEL:   CBC  Recent Labs Lab 01/02/16 0420 01/04/16 0415  WBC 8.9  --   HGB 10.3* 9.8*  HCT 30.0*  --   PLT 168  --    ------------------------------------------------------------------------------------------------------------------ Chemistries   Recent Labs Lab 01/01/16 0527  01/04/16 0415  NA 137  < > 140  K 3.4*  < > 4.2  CL 106  < > 102  CO2 25  < > 34*  GLUCOSE 79  < > 133*  BUN 7  < > 15  CREATININE <0.30*  < > 0.50  CALCIUM 7.4*  < > 8.3*  AST 24  --   --   ALT 19  --   --   ALKPHOS 70  --   --   BILITOT 0.3  --   --   < > = values in this interval not displayed. ------------------------------------------------------------------------------------------------------------------  Cardiac Enzymes  Recent Labs Lab 12/31/15 0011  TROPONINI <0.03   ------------------------------------------------------------------------------------------------------------------  RADIOLOGY:  No results found.   ASSESSMENT AND PLAN:   * HCAP (healthcare-associated pneumonia) with Acute hypoxic respiratory failure and sepsis IV abx. Cultures no growth Nebs PRN Wean of O2 as tolerated CT chest shows no masses. B/L infiltrates. Continue meropenem.  * Acute encephalopathy - Likely multifactorial - improved With hypoxia, bipolar. CT head showed nothing acute   *Anemia- Improved with 1 unit packed RBC transfused Likely due to  chemo and anemia of chronic disease.   *Herpes zoster - zoster-like dermatomal rash in her gluteal region.  On Acyclovir.  *Small cell carcinoma of lung ([place]) Appreciate  oncology input  *Bipolar I disorder, most recent episode manic, severe with psychotic features [facility]) Psychiatry input appreciated  All the records are reviewed and case discussed with Care Management/Social Workerr. Management plans discussed with the patient, family and they are in agreement.  CODE STATUS: FULL CODE  DVT Prophylaxis: SCDs  TOTAL TIME TAKING CARE OF THIS PATIENT: 35 minutes.   Likely d/c in AM to ALF after PPD is read.  [name] R M.D on 01/04/2016 at 11:36 AM  Between 7am to 6pm - Pager - [phone]  After 6pm go to [email or web address] - password [email or web address] [facility]  Office  [phone]  CC: Primary care physician; Jiles Garter, MD  Note: This dictation was prepared with Dragon dictation along with smaller phrase technology. Any transcriptional errors that result from this process are unintentional.

## 2016-01-04 NOTE — Progress Notes (Signed)
Pharmacy Antibiotic Note  Charlene [name] is a 55 y.o. female admitted on 12/30/2015 with pneumonia.  Admitted to [facility]. Med 7/7- 12/30/2015 then transferred to Medical floor 7/16 d/t unresposive, drop in O2 sats.   Plan: Patient to transition to Levaquin po '500mg'$  Q24h. (Patient has received 5 days of Azithromycin/Meropenem)  Height: '5\' 8"'$  (172.7 cm) Weight: 157 lb 8 oz (71.442 kg) IBW/kg (Calculated) : 63.9  Temp (24hrs), Avg:98.1 F (36.7 C), Min:97.8 F (36.6 C), Max:98.4 F (36.9 C)   Recent Labs Lab 12/30/15 1513 12/31/15 0011 12/31/15 0945 01/01/16 0527 01/01/16 1919 01/02/16 0420 01/03/16 0340 01/03/16 0345 01/04/16 0415  WBC 9.0 8.8  --  8.7  --  8.9  --   --   --   CREATININE 0.57 0.56  --  <0.30*  --  0.39* 0.36*  --  0.50  LATICACIDVEN  --  0.7 0.6  --   --   --   --   --   --   VANCOTROUGH  --   --   --   --  5*  --   --  12*  --     Estimated Creatinine Clearance: 81.1 mL/min (by C-G formula based on Cr of 0.5).    Allergies  Allergen Reactions  . Amoxicillin-Pot Clavulanate Hives    Also vomiting  . Septra [Sulfamethoxazole-Trimethoprim] Hives and Nausea And Vomiting    Antimicrobials this admission: vancomycin  >> 7/20 meropenem 7/17 >> 7/21 Azithromycin 7/17 >> 7/21 Acyclovir 7/17 >> Levaquin 7/21 >>  Dose adjustments this admission: Vancomycin 1g q 12 to vanc 1g q 8hr  Microbiology results: 7/17 BCx: pending 7/17 SWF:UXNATFTD species 7/17 Sputum: pending  7/17 UA: pending 7/17 CXR: R atelectasis vs. Infiltrate MRSA PCR +  Thank you for allowing pharmacy to be a part of this patient's care.  Gaberiel Youngblood A, Pharm.D., BCPS Clinical Pharmacist 01/04/2016 1:06 PM

## 2016-01-04 NOTE — NC FL2 (Signed)
[place] LEVEL OF CARE SCREENING TOOL     IDENTIFICATION  Patient Name: Charlene [name] Birthdate: 1961-06-05 Sex: female Admission Date (Current Location): 12/30/2015  [place] and [place] Number:  [name] [id number] [place] and Address:  [facility], [address], [place], [place] [address]      Provider Number: [id number]  Attending Physician Name and Address:  [name], MD  Relative Name and Phone Number:  Arelia Sneddon (father) [phone]    Current Level of Care: Hospital Recommended Level of Care: [place] Prior Approval Number:    Date Approved/Denied:   PASRR Number:    Discharge Plan: Other (Comment)    Current Diagnoses: Patient Active Problem List   Diagnosis Date Noted  . HCAP (healthcare-associated pneumonia) 12/31/2015  . Acute encephalopathy 12/31/2015  . Herpes zoster 12/31/2015  . Anemia 12/31/2015  . Acute psychosis associated with endocrine, metabolic, or cerebrovascular disorder 12/21/2015  . Cocaine use disorder, moderate, dependence ([place]) 12/21/2015  . Bipolar I disorder, most recent episode manic, severe with psychotic features ([place]) 12/21/2015  . OCD (obsessive compulsive disorder) 12/21/2015  . PTSD (post-traumatic stress disorder) 12/21/2015  . Abnormal cells of cervix 07/30/2015  . Encounter for general adult medical examination without abnormal findings 07/28/2015  . Primary cancer of right lower lobe of lung ([place]) 07/12/2015  . At risk for noncompliance 06/29/2015  . Spasm 06/26/2015  . Essential (primary) hypertension 06/21/2015  . Lung mass 06/21/2015  . Candida infection of mouth 06/21/2015  . Small cell carcinoma of lung ([place]) 06/21/2015  . Episodic mood disorder ([place]) 10/14/2014  . Chronic obstructive pulmonary disease ([place]) 10/06/2014  . Pulmonary emphysema ([place]) 10/06/2014  . LBP (low back pain) 09/27/2014  . Tobacco use disorder 09/27/2014    Orientation  RESPIRATION BLADDER Height & Weight     Self, Time, Place  Normal Continent Weight: 157 lb 8 oz (71.442 kg) Height:  '5\' 8"'$  (172.7 cm)  BEHAVIORAL SYMPTOMS/MOOD NEUROLOGICAL BOWEL NUTRITION STATUS   (None)  (None) Continent  (Normal )  AMBULATORY STATUS COMMUNICATION OF NEEDS Skin   Limited Assist Verbally Normal                       Personal Care Assistance Level of Assistance  Bathing, Feeding, Dressing, Total care Bathing Assistance: Limited assistance Feeding assistance: Limited assistance Dressing Assistance: Limited assistance Total Care Assistance: Limited assistance   Functional Limitations Info  Sight, Hearing, Speech Sight Info: Adequate Hearing Info: Adequate Speech Info: Adequate    SPECIAL CARE FACTORS FREQUENCY  PT (By licensed PT)     PT Frequency: 2x weekly               Contractures Contractures Info: Not present    Additional Factors Info  Psychotropic, Allergies   Allergies Info: Amoxicillin, Septra           Current Medications (01/04/2016):  This is the current hospital active medication list Current Facility-Administered Medications  Medication Dose Route Frequency Provider Last Rate Last Dose  . 0.9 %  sodium chloride infusion   Intravenous Once Lance Coon, MD      . acetaminophen (TYLENOL) tablet 650 mg  650 mg Oral Q6H PRN Lance Coon, MD   650 mg at 12/31/15 1009   Or  . acetaminophen (TYLENOL) suppository 650 mg  650 mg Rectal Q6H PRN Lance Coon, MD      . acyclovir (ZOVIRAX) 200 MG capsule 800 mg  800 mg Oral  5 X Daily [name], MD   800 mg at 01/04/16 0859  . benzonatate (TESSALON) capsule 100 mg  100 mg Oral Q4H PRN [name], MD   100 mg at 01/03/16 1222  . Chlorhexidine Gluconate Cloth 2 % PADS 6 each  6 each Topical Q0600 Lance Coon, MD   6 each at 01/04/16 [phone]  . chlorpheniramine-HYDROcodone (TUSSIONEX) 10-8 MG/5ML suspension 5 mL  5 mL Oral Q12H Srikar Sudini, MD   5 mL at 01/04/16 0900  . diltiazem  (CARDIZEM) tablet 30 mg  30 mg Oral Q12H [name], MD   30 mg at 01/04/16 0859  . divalproex (DEPAKOTE) DR tablet 500 mg  500 mg Oral Q8H Gonzella Lex, MD   500 mg at 01/04/16 0546  . HYDROcodone-acetaminophen (NORCO) 7.5-325 MG per tablet 1 tablet  1 tablet Oral Q6H PRN [name], MD   1 tablet at 01/04/16 0412  . ipratropium-albuterol (DUONEB) 0.5-2.5 (3) MG/3ML nebulizer solution 3 mL  3 mL Nebulization Q4H PRN [name], MD   3 mL at 01/03/16 1450  . levofloxacin (LEVAQUIN) tablet 500 mg  500 mg Oral Daily Srikar Sudini, MD      . mupirocin ointment (BACTROBAN) 2 % 1 application  1 application Nasal BID Lance Coon, MD   1 application at 09/64/38 [phone]  . nicotine (NICODERM CQ - dosed in mg/24 hours) patch 21 mg  21 mg Transdermal Daily Alexis Hugelmeyer, DO   21 mg at 01/04/16 0859  . ondansetron (ZOFRAN) tablet 4 mg  4 mg Oral Q6H PRN Lance Coon, MD   4 mg at 01/04/16 0900   Or  . ondansetron (ZOFRAN) injection 4 mg  4 mg Intravenous Q6H PRN Lance Coon, MD   4 mg at 01/02/16 1800  . pantoprazole (PROTONIX) EC tablet 40 mg  40 mg Oral BID AC Srikar Sudini, MD   40 mg at 01/04/16 0859  . predniSONE (DELTASONE) tablet 40 mg  40 mg Oral Q breakfast [name], MD   40 mg at 01/04/16 0859  . risperiDONE (RISPERDAL) tablet 1 mg  1 mg Oral BID Gonzella Lex, MD   1 mg at 01/04/16 0859  . sodium chloride flush (NS) 0.9 % injection 3 mL  3 mL Intravenous Q12H Lance Coon, MD   3 mL at 01/04/16 0900  . tuberculin injection 5 Units  5 Units Intradermal Once [name], MD   5 Units at 01/03/16 1233     Discharge Medications: Please see discharge summary for a list of discharge medications.  Relevant Imaging Results:  Relevant Lab Results:   Additional Information SSN: [id number]   Full Code  Pt will have [place], [place]

## 2016-01-04 NOTE — Consult Note (Signed)
[facility] Consult   Reason for Consult:  Follow-up consult for this 55 year old woman with a history of bipolar disorder who was transferred from the psychiatric ward to medical service last night Referring Physician:  Sudini Patient Identification: Charlene [name] MRN:  [id number] Principal Diagnosis: HCAP (healthcare-associated pneumonia) Diagnosis:   Patient Active Problem List   Diagnosis Date Noted  . HCAP (healthcare-associated pneumonia) [J18.9] 12/31/2015  . Acute encephalopathy [G93.40] 12/31/2015  . Herpes zoster [B02.9] 12/31/2015  . Anemia [D64.9] 12/31/2015  . Acute psychosis associated with endocrine, metabolic, or cerebrovascular disorder [F06.8, F01.50] 12/21/2015  . Cocaine use disorder, moderate, dependence ([place]) [F14.20] 12/21/2015  . Bipolar I disorder, most recent episode manic, severe with psychotic features ([place]) [F31.2] 12/21/2015  . OCD (obsessive compulsive disorder) [F42.9] 12/21/2015  . PTSD (post-traumatic stress disorder) [F43.10] 12/21/2015  . Abnormal cells of cervix [N87.9] 07/30/2015  . Encounter for general adult medical examination without abnormal findings [Z00.00] 07/28/2015  . Primary cancer of right lower lobe of lung ([place]) [C34.31] 07/12/2015  . At risk for noncompliance [Z87.898] 06/29/2015  . Spasm [R25.2] 06/26/2015  . Essential (primary) hypertension [I10] 06/21/2015  . Lung mass [R91.8] 06/21/2015  . Candida infection of mouth [B37.0] 06/21/2015  . Small cell carcinoma of lung ([place]) [C34.90] 06/21/2015  . Episodic mood disorder ([place]) [F39] 10/14/2014  . Chronic obstructive pulmonary disease ([place]) [J44.9] 10/06/2014  . Pulmonary emphysema ([place]) [J43.9] 10/06/2014  . LBP (low back pain) [M54.5] 09/27/2014  . Tobacco use disorder [F17.200] 09/27/2014    Total Time spent with patient: 20 minutes  Subjective:   Charlene [name] is a 55 y.o. female patient admitted with "I'm feeling better".  Follow-up Friday the 45st.  55 year old woman with bipolar disorder and advanced lung cancer and pneumonia. Patient seen chart reviewed. She was neatly dressed and appropriately interactive. Speech normal rate tone and volume. Affect euthymic and appropriate. Did not make any bizarre statements and did not appear to be having delusions. Denied auditory or visual hallucinations. Shows reasonably good insight into her current condition.  HPI:  54 year old woman with bipolar disorder and lung cancer. She had been admitted to the hospital psychiatrically recently for a manic episode. At the time of admission she was described as being hyperverbal hyperactive grandiose and delusional. She was being treated with antipsychotic and mood stabilizing medicine on the psychiatric ward. Had been hyperactive and agitated up until a couple days ago and then this weekend became sedated. Yesterday became extremely lethargic. Oxygen level dropped. Last night after a rapid response was called she was transferred to the medical service. On interview today the patient says she is feeling a little better. She no she is in the hospital and she is able to give me at least a vague answer indicating she know she was on the psychiatric ward. She says her mood is feeling okay. Denies hallucinations. Denies suicidal thoughts. She is still a little sluggish and out of bed and not able to answer in much more detail than that. Much more active however than she was yesterday.  Past Psychiatric History: Long history of bipolar disorder multiple prior hospitalizations for mania.  Risk to Self: Is patient at risk for suicide?: Nopast history of suicide attempts and agitation and poor self-care Risk to Others:  not threatening Prior Inpatient Therapy:  history of multiple prior inpatient treatments Prior Outpatient Therapy:  on history of bipolar disorder  Past Medical History:  Past Medical History  Diagnosis Date  . Primary cancer  of right lower lobe of lung ([place])  07/12/2015    Small cell undifferentiated carcinoma of lung.  Diagnosis at [facility] by fine-needle aspiration of lymph node (January, 2017)  . Bipolar 2 disorder ([place])   . Borderline schizophrenia   . Falls infrequently   . Asthma   . Hypertension     Past Surgical History  Procedure Laterality Date  . Abdominal hysterectomy    . Appendectomy    . Stomach surgery    . Nasal sinus surgery      x2  . Peripheral vascular catheterization N/A 07/30/2015    Procedure: Charlene [name] Cath Insertion;  Surgeon: Charlene Huxley, MD;  Location: [facility];  Service: Cardiovascular;  Laterality: N/A;   Family History:  Family History  Problem Relation Age of Onset  . Hypertension Father    Family Psychiatric  History: Patient denies being aware of any Social History:  History  Alcohol Use No     History  Drug Use  . Yes  . Special: Cocaine    Comment: States due to cocaine in mouthwash    Social History   Social History  . Marital Status: Divorced    Spouse Name: N/A  . Number of Children: N/A  . Years of Education: N/A   Social History Main Topics  . Smoking status: Former Smoker -- 0.50 packs/day for 30 years    Quit date: 06/17/2015  . Smokeless tobacco: None  . Alcohol Use: No  . Drug Use: Yes    Special: Cocaine     Comment: States due to cocaine in mouthwash  . Sexual Activity: Not Asked   Other Topics Concern  . None   Social History Narrative   Additional Social History:    Allergies:   Allergies  Allergen Reactions  . Amoxicillin-Pot Clavulanate Hives    Also vomiting  . Septra [Sulfamethoxazole-Trimethoprim] Hives and Nausea And Vomiting    Labs:  Results for orders placed or performed during the hospital encounter of 12/30/15 (from the past 48 hour(s))  Basic metabolic panel     Status: Abnormal   Collection Time: 01/03/16  3:40 AM  Result Value Ref Range   Sodium 139 135 - 145 mmol/L   Potassium 4.3 3.5 - 5.1 mmol/L   Chloride 102 101 - 111  mmol/L   CO2 32 22 - 32 mmol/L   Glucose, Bld 132 (H) 65 - 99 mg/dL   BUN 10 6 - 20 mg/dL   Creatinine, Ser 0.36 (L) 0.44 - 1.00 mg/dL   Calcium 8.1 (L) 8.9 - 10.3 mg/dL   GFR calc non Af Amer >60 >60 mL/min   GFR calc Af Amer >60 >60 mL/min    Comment: (NOTE) The eGFR has been calculated using the CKD EPI equation. This calculation has not been validated in all clinical situations. eGFR's persistently <60 mL/min signify possible Chronic Kidney Disease.    Anion gap 5 5 - 15  Vancomycin, trough     Status: Abnormal   Collection Time: 01/03/16  3:45 AM  Result Value Ref Range   Vancomycin Tr 12 (L) 15 - 20 ug/mL  Basic metabolic panel     Status: Abnormal   Collection Time: 01/04/16  4:15 AM  Result Value Ref Range   Sodium 140 135 - 145 mmol/L   Potassium 4.2 3.5 - 5.1 mmol/L   Chloride 102 101 - 111 mmol/L   CO2 34 (H) 22 - 32 mmol/L   Glucose, Bld 133 (  H) 65 - 99 mg/dL   BUN 15 6 - 20 mg/dL   Creatinine, Ser 0.50 0.44 - 1.00 mg/dL   Calcium 8.3 (L) 8.9 - 10.3 mg/dL   GFR calc non Af Amer >60 >60 mL/min   GFR calc Af Amer >60 >60 mL/min    Comment: (NOTE) The eGFR has been calculated using the CKD EPI equation. This calculation has not been validated in all clinical situations. eGFR's persistently <60 mL/min signify possible Chronic Kidney Disease.    Anion gap 4 (L) 5 - 15  Hemoglobin     Status: Abnormal   Collection Time: 01/04/16  4:15 AM  Result Value Ref Range   Hemoglobin 9.8 (L) 12.0 - 16.0 g/dL  Valproic acid level     Status: Abnormal   Collection Time: 01/04/16  4:15 AM  Result Value Ref Range   Valproic Acid Lvl 14 (L) 50.0 - 100.0 ug/mL    Current Facility-Administered Medications  Medication Dose Route Frequency Provider Last Rate Last Dose  . 0.9 %  sodium chloride infusion   Intravenous Once Lance Coon, MD      . acetaminophen (TYLENOL) tablet 650 mg  650 mg Oral Q6H PRN Lance Coon, MD   650 mg at 01/04/16 1752   Or  . acetaminophen (TYLENOL)  suppository 650 mg  650 mg Rectal Q6H PRN Lance Coon, MD      . acyclovir (ZOVIRAX) 200 MG capsule 800 mg  800 mg Oral 5 X Daily Srikar Sudini, MD   800 mg at 01/04/16 1702  . benzonatate (TESSALON) capsule 100 mg  100 mg Oral Q4H PRN [name], MD   100 mg at 01/04/16 1434  . Chlorhexidine Gluconate Cloth 2 % PADS 6 each  6 each Topical Q0600 Lance Coon, MD   6 each at 01/04/16 [phone]  . chlorpheniramine-HYDROcodone (TUSSIONEX) 10-8 MG/5ML suspension 5 mL  5 mL Oral Q12H Srikar Sudini, MD   5 mL at 01/04/16 0900  . diltiazem (CARDIZEM) tablet 30 mg  30 mg Oral Q12H [name], MD   30 mg at 01/04/16 0859  . divalproex (DEPAKOTE) DR tablet 500 mg  500 mg Oral Q8H Gonzella Lex, MD   500 mg at 01/04/16 1353  . HYDROcodone-acetaminophen (NORCO) 7.5-325 MG per tablet 1 tablet  1 tablet Oral Q6H PRN [name], MD   1 tablet at 01/04/16 1551  . ipratropium-albuterol (DUONEB) 0.5-2.5 (3) MG/3ML nebulizer solution 3 mL  3 mL Nebulization Q4H PRN [name], MD   3 mL at 01/03/16 1450  . levofloxacin (LEVAQUIN) tablet 500 mg  500 mg Oral Daily [name], MD   500 mg at 01/04/16 1354  . mupirocin ointment (BACTROBAN) 2 % 1 application  1 application Nasal BID Lance Coon, MD   1 application at 07/37/10 [phone]  . nicotine (NICODERM CQ - dosed in mg/24 hours) patch 21 mg  21 mg Transdermal Daily Alexis Hugelmeyer, DO   21 mg at 01/04/16 0859  . ondansetron (ZOFRAN) tablet 4 mg  4 mg Oral Q6H PRN Lance Coon, MD   4 mg at 01/04/16 1434   Or  . ondansetron [facility]) injection 4 mg  4 mg Intravenous Q6H PRN Lance Coon, MD   4 mg at 01/02/16 1800  . pantoprazole (PROTONIX) EC tablet 40 mg  40 mg Oral BID AC Srikar Sudini, MD   40 mg at 01/04/16 1551  . predniSONE (DELTASONE) tablet 40 mg  40 mg Oral Q breakfast [name], MD  40 mg at 01/04/16 0859  . risperiDONE (RISPERDAL) tablet 1 mg  1 mg Oral BID Gonzella Lex, MD   1 mg at 01/04/16 0859  . sodium chloride flush (NS) 0.9 %  injection 3 mL  3 mL Intravenous Q12H Lance Coon, MD   3 mL at 01/04/16 0900  . tuberculin injection 5 Units  5 Units Intradermal Once [name], MD   5 Units at 01/03/16 1233    Musculoskeletal: Strength & Muscle Tone: decreased Gait & Station: unable to stand Patient leans: N/A  Psychiatric Specialty Exam: Physical Exam  Nursing note and vitals reviewed. HENT:  Head: Normocephalic and atraumatic.  Eyes: Conjunctivae are normal. Pupils are equal, round, and reactive to light.  Neck: Normal range of motion.  Cardiovascular: Regular rhythm and normal heart sounds.   Respiratory: She is in respiratory distress.  GI: Soft.  Musculoskeletal: Normal range of motion.  Neurological: She is alert.  Skin: Skin is warm and dry.     Psychiatric: Her speech is normal. Her mood appears anxious. She is not slowed. Thought content is not paranoid. Cognition and memory are impaired. She does not express impulsivity. She expresses no suicidal ideation.    Review of Systems  Constitutional: Negative.   HENT: Negative.   Eyes: Negative.   Respiratory: Positive for cough, sputum production and shortness of breath.   Cardiovascular: Negative.   Gastrointestinal: Negative.   Musculoskeletal: Negative.   Skin: Negative.   Neurological: Negative.   Psychiatric/Behavioral: Positive for memory loss. Negative for depression, suicidal ideas, hallucinations and substance abuse. The patient has insomnia. The patient is not nervous/anxious.     Blood pressure 129/82, pulse 98, temperature 97.8 F (36.6 C), temperature source Oral, resp. rate 18, height _0  (1.727 m), weight 71.442 kg (157 lb 8 oz), SpO2 93 %.Body mass index is 23.95 kg/(m^2).  General Appearance: Disheveled  Eye Contact:  Minimal  Speech:  Garbled and Slow  Volume:  Decreased  Mood:  Euthymic  Affect:  Constricted  Thought Process:  Coherent  Orientation:  Full (Time, Place, and Person)  Thought Content:  Logical  Suicidal  Thoughts:  No  Homicidal Thoughts:  No  Memory:  Immediate;   Fair Recent;   Fair Remote;   Fair  Judgement:  Fair  Insight:  Fair  Psychomotor Activity:  Decreased  Concentration:  Concentration: Fair  Recall:  [employer] of Knowledge:  Fair  Language:  Fair  Akathisia:  No  Handed:  Right  AIMS (if indicated):     Assets:  Financial Resources/Insurance Resilience Social Support  ADL's:  Impaired  Cognition:  Impaired,  Mild  Sleep:        Treatment Plan Summary: Daily contact with patient to assess and evaluate symptoms and progress in treatment, Medication management and Plan Patient continues to look good. Affect and mood are appropriate. Thoughts appear to be lucid. Behavior calm. Able to discuss her illness and treatment appropriately. Patient may be ready for discharge before the end of the weekend if so she should stay on her current medicine as prescribed follow-up with outpatient provider.Her Depakote level today however is quite low. Could be from noncompliance although she had been noncompliant all along I might expect to be 0. I am going to increase the dosage.  Disposition: Supportive therapy provided about ongoing stressors.  Alethia Berthold, MD 01/04/2016 6:45 PM

## 2016-01-05 LAB — CULTURE, BLOOD (ROUTINE X 2)
CULTURE: NO GROWTH
Culture: NO GROWTH

## 2016-01-05 LAB — GLUCOSE, CAPILLARY: GLUCOSE-CAPILLARY: 71 mg/dL (ref 65–99)

## 2016-01-05 MED ORDER — CYCLOBENZAPRINE HCL 10 MG PO TABS
5.0000 mg | ORAL_TABLET | Freq: Every day | ORAL | Status: DC | PRN
  Administered 2016-01-05 – 2016-01-10 (×6): 5 mg via ORAL
  Filled 2016-01-05: qty 2
  Filled 2016-01-05 (×6): qty 1

## 2016-01-05 NOTE — Evaluation (Signed)
Physical Therapy Evaluation Patient Details Name: Charlene [name] MRN: [id number] DOB: 30-May-1961 Today's Date: 01/05/2016   History of Present Illness  Pt is a pleasant 55 y/o female admitted under IVC to behavioral health unit. Patient was admitted with agitated, combative behavior with disorganized and psychotic thinking. She has a history of cancer and recently finished radiation tx. Pt is now on medical floor with pneumonia dx.  Clinical Impression  Pt admitted to the floor from behavioral medicine secondary to pneumonia.  She reports she is having a good day and was able to ambulate ~200 ft around the nurses station (with walker), O2 dropped to mid 80s on room air (93% on room air prior to ambulation) and 2 liters donned post session.  She did not have any LOBs or significant hesitation during ambulation but reports that generally she generally has been feeling unsteady and feels much safer with walker (pt has had a few falls at home).  Pt did well but does not feel ready to go home, wanting to go to an assisted living setting for a while on d/c.    Follow Up Recommendations  (PT in assisted living setting)    Equipment Recommendations  Rolling walker with 5" wheels (unsure if pt does actually have a walker)    Recommendations for Other Services       Precautions / Restrictions Precautions Precautions: Fall Restrictions Weight Bearing Restrictions: No      Mobility  Bed Mobility Overal bed mobility: Modified Independent             General bed mobility comments: Pt slow getting to sitting at EOB, but able to do so w/o assist  Transfers Overall transfer level: Modified independent Equipment used: Rolling walker (2 wheeled)             General transfer comment: Pt able to get to standing w/o direct assist, has some hesitancy and need for UEs on walker but generally safe and confident  Ambulation/Gait Ambulation/Gait assistance: Supervision Ambulation Distance  (Feet): 200 Feet Assistive device: Rolling walker (2 wheeled)       General Gait Details: Pt showed good safety with the FWW but reports that she has good and bad days walking (and that today was a good day).  She ultimately is able to maintain consistent speed with ambulation but did fatigue with the effort on   Stairs            Wheelchair Mobility    Modified Rankin (Stroke Patients Only)       Balance Overall balance assessment: Modified Independent                                           Pertinent Vitals/Pain Pain Assessment: 0-10 Pain Score: 5  Pain Location: R ribs, L shoulder    Home Living Family/patient expects to be discharged to:: Assisted living Living Arrangements: Parent                    Prior Function Level of Independence: Independent         Comments: Pt with recent falls at home, has been feeling less steady and safe. Pt still going to church weekly and able to get out of the house regularly     Hand Dominance        Extremity/Trunk Assessment   Upper Extremity Assessment: Generalized weakness (L  RTC injury, R rib pain effecting resisted and AROM)           Lower Extremity Assessment: Overall WFL for tasks assessed         Communication   Communication: No difficulties  Cognition Arousal/Alertness: Awake/alert Behavior During Therapy: WFL for tasks assessed/performed Overall Cognitive Status: Within Functional Limits for tasks assessed                      General Comments      Exercises        Assessment/Plan    PT Assessment Patient needs continued PT services  PT Diagnosis Difficulty walking;Generalized weakness   PT Problem List Decreased strength;Decreased range of motion;Decreased activity tolerance;Decreased balance;Decreased mobility;Decreased safety awareness;Decreased knowledge of use of DME  PT Treatment Interventions DME instruction;Gait training;Stair  training;Functional mobility training;Therapeutic activities;Therapeutic exercise;Balance training;Neuromuscular re-education   PT Goals (Current goals can be found in the Care Plan section) Acute Rehab PT Goals Patient Stated Goal: eventually go home PT Goal Formulation: With patient Time For Goal Achievement: 01/19/16 Potential to Achieve Goals: Fair    Frequency Min 2X/week   Barriers to discharge        Co-evaluation               End of Session Equipment Utilized During Treatment: Gait belt Activity Tolerance: Patient limited by fatigue Patient left: with bed alarm set;with call bell/phone within reach           Time: 0815-0829 PT Time Calculation (min) (ACUTE ONLY): 14 min   Charges:   PT Evaluation $PT Eval Low Complexity: 1 Procedure     PT G CodesKreg Shropshire, DPT 01/05/2016, 10:18 AM

## 2016-01-05 NOTE — Progress Notes (Signed)
PPD negative. MD aware. Madlyn Frankel, RN

## 2016-01-05 NOTE — Progress Notes (Signed)
[facility] NAME: Charlene [name]    MR#:  [id number]  DATE OF BIRTH:  01/07/1961  SUBJECTIVE:  CHIEF COMPLAINT:  No chief complaint on file.   Feels well. Some cough. Back and shoulder pain is chronic. PPD  Test started yesterday  REVIEW OF SYSTEMS:    Review of Systems  Constitutional: Positive for malaise/fatigue. Negative for fever and chills.  HENT: Negative for sore throat.   Eyes: Negative for blurred vision, double vision and pain.  Respiratory: Positive for cough and shortness of breath. Negative for hemoptysis and wheezing.   Cardiovascular: Negative for chest pain, palpitations, orthopnea and leg swelling.  Gastrointestinal: Negative for heartburn, nausea, vomiting, abdominal pain, diarrhea and constipation.  Genitourinary: Negative for dysuria and hematuria.  Musculoskeletal: Positive for back pain and joint pain.  Skin: Negative for rash.  Neurological: Positive for weakness. Negative for sensory change, speech change, focal weakness and headaches.  Endo/Heme/Allergies: Does not bruise/bleed easily.  Psychiatric/Behavioral: Negative for depression. The patient is nervous/anxious.    DRUG ALLERGIES:   Allergies  Allergen Reactions  . Amoxicillin-Pot Clavulanate Hives    Also vomiting  . Septra [Sulfamethoxazole-Trimethoprim] Hives and Nausea And Vomiting   VITALS:  Blood pressure 139/87, pulse 94, temperature 98.1 F (36.7 C), temperature source Oral, resp. rate 20, height '5\' 8"'$  (1.727 m), weight 71.442 kg (157 lb 8 oz), SpO2 93 %.  PHYSICAL EXAMINATION:   Physical Exam  GENERAL:  56 y.o.-year-old patient lying in the bed EYES: Pupils equal, round, reactive to light and accommodation. No scleral icterus. Extraocular muscles intact.  HEENT: Head atraumatic, normocephalic. Oropharynx and nasopharynx clear.  NECK:  Supple, no jugular venous distention. No thyroid enlargement, no tenderness.  LUNGS:  Normal breath sounds bilaterally, no wheezing, rales, rhonchi. No use of accessory muscles of respiration. Port-A-Cath in place CARDIOVASCULAR: S1, S2 normal. No murmurs, rubs, or gallops.  ABDOMEN: Soft, nontender, nondistended. Bowel sounds present. No organomegaly or mass.  EXTREMITIES: No cyanosis, clubbing or edema b/l.    NEUROLOGIC: Moves all 4 extremities PSYCHIATRIC: The patient is alert and awake  LABORATORY PANEL:   CBC  Recent Labs Lab 01/02/16 0420 01/04/16 0415  WBC 8.9  --   HGB 10.3* 9.8*  HCT 30.0*  --   PLT 168  --    ------------------------------------------------------------------------------------------------------------------ Chemistries   Recent Labs Lab 01/01/16 0527  01/04/16 0415  NA 137  < > 140  K 3.4*  < > 4.2  CL 106  < > 102  CO2 25  < > 34*  GLUCOSE 79  < > 133*  BUN 7  < > 15  CREATININE <0.30*  < > 0.50  CALCIUM 7.4*  < > 8.3*  AST 24  --   --   ALT 19  --   --   ALKPHOS 70  --   --   BILITOT 0.3  --   --   < > = values in this interval not displayed. ------------------------------------------------------------------------------------------------------------------  Cardiac Enzymes  Recent Labs Lab 12/31/15 0011  TROPONINI <0.03   ------------------------------------------------------------------------------------------------------------------  RADIOLOGY:  No results found.   ASSESSMENT AND PLAN:   * HCAP (healthcare-associated pneumonia) with Acute hypoxic respiratory failure and sepsis  Cultures no growth Abx changed to Levaquin Nebs PRN Wean of O2 as tolerated CT chest shows no masses. B/L infiltrates.  * Acute encephalopathy - Likely multifactorial - improved With hypoxia, bipolar. CT head showed nothing acute   *Anemia- Improved  with 1 unit packed RBC transfused Likely due to chemo and anemia of chronic disease.   *Herpes zoster - zoster-like dermatomal rash in her gluteal region.  On Acyclovir. Finished  5 days Stop today  *Small cell carcinoma of lung ([place]) Appreciate oncology input  *Bipolar I disorder, most recent episode manic, severe with psychotic features [facility]) Psychiatry input appreciated  All the records are reviewed and case discussed with Care Management/Social Workerr. Management plans discussed with the patient, family and they are in agreement.  CODE STATUS: FULL CODE  DVT Prophylaxis: SCDs  TOTAL TIME TAKING CARE OF THIS PATIENT: 35 minutes.   ALF when approved  [name] R M.D on 01/05/2016 at 1:02 PM  Between 7am to 6pm - Pager - [phone]  After 6pm go to [email or web address] - password [email or web address] [facility]  Office  [phone]  CC: Primary care physician; Jiles Garter, MD  Note: This dictation was prepared with Dragon dictation along with smaller phrase technology. Any transcriptional errors that result from this process are unintentional.

## 2016-01-06 NOTE — Progress Notes (Signed)
[place] at [place] NAME: Charlene [name]    MR#:  [id number]  DATE OF BIRTH:  1960-06-21  SUBJECTIVE:  CHIEF COMPLAINT:  No chief complaint on file.   Dry cough. Walking with a walker  REVIEW OF SYSTEMS:    Review of Systems  Constitutional: Positive for malaise/fatigue. Negative for fever and chills.  HENT: Negative for sore throat.   Eyes: Negative for blurred vision, double vision and pain.  Respiratory: Positive for cough and shortness of breath. Negative for hemoptysis and wheezing.   Cardiovascular: Negative for chest pain, palpitations, orthopnea and leg swelling.  Gastrointestinal: Negative for heartburn, nausea, vomiting, abdominal pain, diarrhea and constipation.  Genitourinary: Negative for dysuria and hematuria.  Musculoskeletal: Positive for back pain and joint pain.  Skin: Negative for rash.  Neurological: Positive for weakness. Negative for sensory change, speech change, focal weakness and headaches.  Endo/Heme/Allergies: Does not bruise/bleed easily.  Psychiatric/Behavioral: Negative for depression. The patient is nervous/anxious.    DRUG ALLERGIES:   Allergies  Allergen Reactions  . Amoxicillin-Pot Clavulanate Hives    Also vomiting  . Septra [Sulfamethoxazole-Trimethoprim] Hives and Nausea And Vomiting   VITALS:  Blood pressure 135/79, pulse 63, temperature 98.1 F (36.7 C), temperature source Oral, resp. rate 20, height '5\' 8"'$  (1.727 m), weight 71.4 kg (157 lb 8 oz), SpO2 99 %.  PHYSICAL EXAMINATION:   Physical Exam  GENERAL:  55 y.o.-year-old patient lying in the bed EYES: Pupils equal, round, reactive to light and accommodation. No scleral icterus. Extraocular muscles intact.  HEENT: Head atraumatic, normocephalic. Oropharynx and nasopharynx clear.  NECK:  Supple, no jugular venous distention. No thyroid enlargement, no tenderness.  LUNGS: Normal breath sounds bilaterally, no wheezing, rales,  rhonchi. No use of accessory muscles of respiration. Port-A-Cath in place CARDIOVASCULAR: S1, S2 normal. No murmurs, rubs, or gallops.  ABDOMEN: Soft, nontender, nondistended. Bowel sounds present. No organomegaly or mass.  EXTREMITIES: No cyanosis, clubbing or edema b/l.    NEUROLOGIC: Moves all 4 extremities PSYCHIATRIC: The patient is alert and awake  LABORATORY PANEL:   CBC  Recent Labs Lab 01/02/16 0420 01/04/16 0415  WBC 8.9  --   HGB 10.3* 9.8*  HCT 30.0*  --   PLT 168  --    ------------------------------------------------------------------------------------------------------------------ Chemistries   Recent Labs Lab 01/01/16 0527  01/04/16 0415  NA 137  < > 140  K 3.4*  < > 4.2  CL 106  < > 102  CO2 25  < > 34*  GLUCOSE 79  < > 133*  BUN 7  < > 15  CREATININE <0.30*  < > 0.50  CALCIUM 7.4*  < > 8.3*  AST 24  --   --   ALT 19  --   --   ALKPHOS 70  --   --   BILITOT 0.3  --   --   < > = values in this interval not displayed. ------------------------------------------------------------------------------------------------------------------  Cardiac Enzymes  Recent Labs Lab 12/31/15 0011  TROPONINI <0.03   ------------------------------------------------------------------------------------------------------------------  RADIOLOGY:  No results found.   ASSESSMENT AND PLAN:   * HCAP (healthcare-associated pneumonia) with Acute hypoxic respiratory failure and sepsis  Cultures no growth Abx changed to Levaquin Nebs PRN Wean of O2 as tolerated CT chest shows no masses. B/L infiltrates.  * Acute encephalopathy - Likely multifactorial - improved With hypoxia, bipolar. CT head showed nothing acute   *Anemia- Improved with 1 unit packed RBC transfused Likely due to  chemo and anemia of chronic disease. Seen by GI and no endoscopy advised.   *Herpes zoster - zoster-like dermatomal rash in her gluteal region.  Was On Acyclovir. Finished 5  days  *Small cell carcinoma of lung ([place]) Appreciate oncology input  *Bipolar I disorder, most recent episode manic, severe with psychotic features [facility]) Psychiatry input appreciated  PPD NEGATIVE  All the records are reviewed and case discussed with Care Management/Social Workerr. Management plans discussed with the patient, family and they are in agreement.  CODE STATUS: FULL CODE  DVT Prophylaxis: SCDs  TOTAL TIME TAKING CARE OF THIS PATIENT: 35 minutes.   Discharge to assisted living facility in the next 1-2 days when bed available.  Charlene [name] R M.D on 01/06/2016 at 11:48 AM  Between 7am to 6pm - Pager - [phone]  After 6pm go to [email or web address] - password [email or web address] [facility]  Office  [phone]  CC: Primary care physician; Jiles Garter, MD  Note: This dictation was prepared with Dragon dictation along with smaller phrase technology. Any transcriptional errors that result from this process are unintentional.

## 2016-01-07 DIAGNOSIS — F3189 Other bipolar disorder: Secondary | ICD-10-CM

## 2016-01-07 LAB — BLOOD GAS, ARTERIAL
Acid-Base Excess: 10.1 mmol/L — ABNORMAL HIGH (ref 0.0–3.0)
Acid-Base Excess: 5.9 mmol/L — ABNORMAL HIGH (ref 0.0–3.0)
Allens test (pass/fail): POSITIVE — AB
BICARBONATE: 32.7 meq/L — AB (ref 21.0–28.0)
BICARBONATE: 34.9 meq/L — AB (ref 21.0–28.0)
FIO2: 0.21
FIO2: 1
O2 Saturation: 85.4 %
O2 Saturation: 99.4 %
PATIENT TEMPERATURE: 37
PH ART: 7.39 (ref 7.350–7.450)
PH ART: 7.47 — AB (ref 7.350–7.450)
PO2 ART: 160 mmHg — AB (ref 83.0–108.0)
PO2 ART: 47 mmHg — AB (ref 83.0–108.0)
Patient temperature: 37
pCO2 arterial: 48 mmHg (ref 32.0–48.0)
pCO2 arterial: 54 mmHg — ABNORMAL HIGH (ref 32.0–48.0)

## 2016-01-07 MED ORDER — HEPARIN SOD (PORK) LOCK FLUSH 100 UNIT/ML IV SOLN
500.0000 [IU] | Freq: Once | INTRAVENOUS | Status: AC
  Administered 2016-01-07: 16:00:00 500 [IU] via INTRAVENOUS
  Filled 2016-01-07: qty 5

## 2016-01-07 NOTE — Progress Notes (Signed)
[place] at [place] NAME: Charlene [name]    MR#:  [id number]  DATE OF BIRTH:  07-09-1960  SUBJECTIVE:  CHIEF COMPLAINT:  No chief complaint on file.   Dry cough. Walking with a walker   Feels much better now.  REVIEW OF SYSTEMS:    ROS DRUG ALLERGIES:   Allergies  Allergen Reactions  . Amoxicillin-Pot Clavulanate Hives    Also vomiting  . Septra [Sulfamethoxazole-Trimethoprim] Hives and Nausea And Vomiting   VITALS:  Blood pressure 112/73, pulse (!) 105, temperature 97.9 F (36.6 C), temperature source Oral, resp. rate 16, height '5\' 8"'$  (1.727 m), weight 71.4 kg (157 lb 8 oz), SpO2 94 %.  PHYSICAL EXAMINATION:   Physical Exam  GENERAL:  55 y.o.-year-old patient lying in the bed EYES: Pupils equal, round, reactive to light and accommodation. No scleral icterus. Extraocular muscles intact.  HEENT: Head atraumatic, normocephalic. Oropharynx and nasopharynx clear.  NECK:  Supple, no jugular venous distention. No thyroid enlargement, no tenderness.  LUNGS: Normal breath sounds bilaterally, no wheezing, rales, rhonchi. No use of accessory muscles of respiration. Port-A-Cath in place CARDIOVASCULAR: S1, S2 normal. No murmurs, rubs, or gallops.  ABDOMEN: Soft, nontender, nondistended. Bowel sounds present. No organomegaly or mass.  EXTREMITIES: No cyanosis, clubbing or edema b/l.    NEUROLOGIC: Moves all 4 extremities, follows commands. PSYCHIATRIC: The patient is alert and awake, oriented X3.  LABORATORY PANEL:   CBC  Recent Labs Lab 01/02/16 0420 01/04/16 0415  WBC 8.9  --   HGB 10.3* 9.8*  HCT 30.0*  --   PLT 168  --    ------------------------------------------------------------------------------------------------------------------ Chemistries   Recent Labs Lab 01/01/16 0527  01/04/16 0415  NA 137  < > 140  K 3.4*  < > 4.2  CL 106  < > 102  CO2 25  < > 34*  GLUCOSE 79  < > 133*  BUN 7  < > 15   CREATININE <0.30*  < > 0.50  CALCIUM 7.4*  < > 8.3*  AST 24  --   --   ALT 19  --   --   ALKPHOS 70  --   --   BILITOT 0.3  --   --   < > = values in this interval not displayed. ------------------------------------------------------------------------------------------------------------------  Cardiac Enzymes No results for input(s): TROPONINI in the last 168 hours. ------------------------------------------------------------------------------------------------------------------  RADIOLOGY:  No results found.   ASSESSMENT AND PLAN:   * HCAP (healthcare-associated pneumonia) with Acute hypoxic respiratory failure and sepsis  Cultures no growth Abx changed to Levaquin Nebs PRN Wean of O2 as tolerated CT chest shows no masses. B/L infiltrates.   She is on 8th day of Abx today.  I will give total 10 days.  * Acute encephalopathy - Likely multifactorial - improved With hypoxia, bipolar. CT head showed nothing acute   *Anemia- Improved with 1 unit packed RBC transfused Likely due to chemo and anemia of chronic disease. Seen by GI and no endoscopy advised.   *Herpes zoster - zoster-like dermatomal rash in her gluteal region.  Was On Acyclovir. Finished 5 days  *Small cell carcinoma of lung [facility]) Appreciate oncology input  she have Follow up scheduled as out pt.  *Bipolar I disorder, most recent episode manic, severe with psychotic features [facility]) Psychiatry input appreciated  PPD NEGATIVE  All the records are reviewed and case discussed with Care Management/Social Workerr. Management plans discussed with the patient, family and they are in  agreement.  CODE STATUS: FULL CODE  DVT Prophylaxis: SCDs  TOTAL TIME TAKING CARE OF THIS PATIENT: 35 minutes.   Discharge to assisted living facility in the next 1-2 days when bed available. Social worker is working on that.  Vaughan Basta M.D on 01/07/2016 at 2:49 PM  Between 7am to 6pm - Pager -  [phone]  After 6pm go to [email or web address] - password [email or web address] [facility]  Office  [phone]  CC: Primary care physician; Jiles Garter, MD  Note: This dictation was prepared with Dragon dictation along with smaller phrase technology. Any transcriptional errors that result from this process are unintentional.

## 2016-01-07 NOTE — Clinical Social Work Note (Signed)
CSW was updated by [facility] CSW that pt's PASARR eval has been asssigned and we are currently waiting to here when the evaluator will be coming to see pt.   CSW also left two messages for group home coordinator regarding a potential bed offer. CSW will continue to follow.   Darden Dates, MSW, LCSW  Clinical Social Worker  [phone]

## 2016-01-07 NOTE — Progress Notes (Signed)
Physical Therapy Treatment Patient Details Name: Charlene [name] MRN: [id number] DOB: [birth year] Today's Date: 01/07/2016    History of Present Illness Pt is a pleasant 55 y/o female admitted under IVC to behavioral health unit. Patient was admitted with agitated, combative behavior with disorganized and psychotic thinking. She has a history of cancer and recently finished radiation tx. Pt is now on medical floor with pneumonia dx.    PT Comments    Pt sitting edge of bed upon arrival stating she just finished walking with nursing but willing to try again.  She stood to walker and was able to ambulate around unit x 2 without loss of balance.  Required education for walker safety upon return to room.  Limited by fatigue.    Follow Up Recommendations        Equipment Recommendations  Rolling walker with 5" wheels    Recommendations for Other Services       Precautions / Restrictions Precautions Precautions: Fall Restrictions Weight Bearing Restrictions: No    Mobility  Bed Mobility Overal bed mobility: Modified Independent             General bed mobility comments: used rail  Transfers Overall transfer level: Modified independent Equipment used: Rolling walker (2 wheeled)             General transfer comment: generally safe  Ambulation/Gait Ambulation/Gait assistance: Supervision Ambulation Distance (Feet): 380 Feet Assistive device: Rolling walker (2 wheeled) Gait Pattern/deviations: Step-through pattern   Gait velocity interpretation: Below normal speed for age/gender General Gait Details: generally steady, needed verbal cues for walker safety upon returning to room.  left walker at end of bed.   Stairs            Wheelchair Mobility    Modified Rankin (Stroke Patients Only)       Balance Overall balance assessment: Modified Independent                                  Cognition Arousal/Alertness: Awake/alert Behavior  During Therapy: WFL for tasks assessed/performed Overall Cognitive Status: Within Functional Limits for tasks assessed                      Exercises      General Comments        Pertinent Vitals/Pain Pain Assessment: No/denies pain    Home Living                      Prior Function            PT Goals (current goals can now be found in the care plan section) Acute Rehab PT Goals Patient Stated Goal: go to ALF Progress towards PT goals: Progressing toward goals    Frequency  Min 2X/week    PT Plan Current plan remains appropriate    Co-evaluation             End of Session Equipment Utilized During Treatment: Gait belt Activity Tolerance: Patient limited by fatigue Patient left: in bed;with call bell/phone within reach     Time: 1937-9024 PT Time Calculation (min) (ACUTE ONLY): 10 min  Charges:  $Gait Training: 8-22 mins                    G Codes:      Chesley Noon, PTA 01/07/16, 9:56 AM

## 2016-01-07 NOTE — Consult Note (Signed)
[facility] Consult   Reason for Consult:  Follow-up consult for this 55 year old woman with a history of bipolar disorder who was transferred from the psychiatric ward to medical service last night Referring Physician:  Sudini Patient Identification: Charlene [name] MRN:  [id number] Principal Diagnosis: HCAP (healthcare-associated pneumonia) Diagnosis:   Patient Active Problem List   Diagnosis Date Noted  . HCAP (healthcare-associated pneumonia) [J18.9] 12/31/2015  . Acute encephalopathy [G93.40] 12/31/2015  . Herpes zoster [B02.9] 12/31/2015  . Anemia [D64.9] 12/31/2015  . Acute psychosis associated with endocrine, metabolic, or cerebrovascular disorder [F06.8, F01.50] 12/21/2015  . Cocaine use disorder, moderate, dependence ([place]) [F14.20] 12/21/2015  . Bipolar I disorder, most recent episode manic, severe with psychotic features ([place]) [F31.2] 12/21/2015  . OCD (obsessive compulsive disorder) [F42.9] 12/21/2015  . PTSD (post-traumatic stress disorder) [F43.10] 12/21/2015  . Abnormal cells of cervix [N87.9] 07/30/2015  . Encounter for general adult medical examination without abnormal findings [Z00.00] 07/28/2015  . Primary cancer of right lower lobe of lung ([place]) [C34.31] 07/12/2015  . At risk for noncompliance [Z87.898] 06/29/2015  . Spasm [R25.2] 06/26/2015  . Essential (primary) hypertension [I10] 06/21/2015  . Lung mass [R91.8] 06/21/2015  . Candida infection of mouth [B37.0] 06/21/2015  . Small cell carcinoma of lung ([place]) [C34.90] 06/21/2015  . Episodic mood disorder ([place]) [F39] 10/14/2014  . Chronic obstructive pulmonary disease ([place]) [J44.9] 10/06/2014  . Pulmonary emphysema ([place]) [J43.9] 10/06/2014  . LBP (low back pain) [M54.5] 09/27/2014  . Tobacco use disorder [F17.200] 09/27/2014    Total Time spent with patient: 20 minutes  Subjective:   Charlene [name] is a 55 y.o. female patient admitted with "I'm feeling better". Follow-up Monday the 24th.  Patient has no new complaints. Feels like her strength is continuing to improve. She is sleeping adequately at night. Not complaining of anything that would be a side effect of medication. Thoughts are not racing. She appears to be clear in her thinking. No pressured speech no sign of mania. She is showing good insight and is compliant with treatment.  HPI:  55 year old woman with bipolar disorder and lung cancer. She had been admitted to the hospital psychiatrically recently for a manic episode. At the time of admission she was described as being hyperverbal hyperactive grandiose and delusional. She was being treated with antipsychotic and mood stabilizing medicine on the psychiatric ward. Had been hyperactive and agitated up until a couple days ago and then this weekend became sedated. Yesterday became extremely lethargic. Oxygen level dropped. Last night after a rapid response was called she was transferred to the medical service. On interview today the patient says she is feeling a little better. She no she is in the hospital and she is able to give me at least a vague answer indicating she know she was on the psychiatric ward. She says her mood is feeling okay. Denies hallucinations. Denies suicidal thoughts. She is still a little sluggish and out of bed and not able to answer in much more detail than that. Much more active however than she was yesterday.  Past Psychiatric History: Long history of bipolar disorder multiple prior hospitalizations for mania.  Risk to Self: Is patient at risk for suicide?: Nopast history of suicide attempts and agitation and poor self-care Risk to Others:  not threatening Prior Inpatient Therapy:  history of multiple prior inpatient treatments Prior Outpatient Therapy:  on history of bipolar disorder  Past Medical History:  Past Medical History:  Diagnosis Date  . Asthma   .  Bipolar 2 disorder ([place])   . Borderline schizophrenia   . Falls infrequently   .  Hypertension   . Primary cancer of right lower lobe of lung ([place]) 07/12/2015   Small cell undifferentiated carcinoma of lung.  Diagnosis at [facility] by fine-needle aspiration of lymph node (January, 2017)    Past Surgical History:  Procedure Laterality Date  . ABDOMINAL HYSTERECTOMY    . APPENDECTOMY    . NASAL SINUS SURGERY     x2  . PERIPHERAL VASCULAR CATHETERIZATION N/A 07/30/2015   Procedure: Glori Luis Cath Insertion;  Surgeon: Algernon Huxley, MD;  Location: [facility];  Service: Cardiovascular;  Laterality: N/A;  . STOMACH SURGERY     Family History:  Family History  Problem Relation Age of Onset  . Hypertension Father    Family Psychiatric  History: Patient denies being aware of any Social History:  History  Alcohol Use No     History  Drug Use  . Types: Cocaine    Comment: States due to cocaine in mouthwash    Social History   Social History  . Marital status: Divorced    Spouse name: N/A  . Number of children: N/A  . Years of education: N/A   Social History Main Topics  . Smoking status: Former Smoker    Packs/day: 0.50    Years: 30.00    Quit date: 06/17/2015  . Smokeless tobacco: None  . Alcohol use No  . Drug use:     Types: Cocaine     Comment: States due to cocaine in mouthwash  . Sexual activity: Not Asked   Other Topics Concern  . None   Social History Narrative  . None   Additional Social History:    Allergies:   Allergies  Allergen Reactions  . Amoxicillin-Pot Clavulanate Hives    Also vomiting  . Septra [Sulfamethoxazole-Trimethoprim] Hives and Nausea And Vomiting    Labs:  No results found for this or any previous visit (from the past 48 hour(s)).  Current Facility-Administered Medications  Medication Dose Route Frequency Provider Last Rate Last Dose  . acetaminophen (TYLENOL) tablet 650 mg  650 mg Oral Q6H PRN Lance Coon, MD   650 mg at 01/06/16 1739   Or  . acetaminophen (TYLENOL) suppository 650 mg  650 mg Rectal  Q6H PRN Lance Coon, MD      . benzonatate (TESSALON) capsule 100 mg  100 mg Oral Q4H PRN [name], MD   100 mg at 01/07/16 1614  . chlorpheniramine-HYDROcodone (TUSSIONEX) 10-8 MG/5ML suspension 5 mL  5 mL Oral Q12H Srikar Sudini, MD   5 mL at 01/07/16 0807  . cyclobenzaprine (FLEXERIL) tablet 5 mg  5 mg Oral Daily PRN [name], MD   5 mg at 01/07/16 1049  . diltiazem (CARDIZEM) tablet 30 mg  30 mg Oral Q12H [name], MD   30 mg at 01/07/16 0807  . divalproex (DEPAKOTE) DR tablet 1,000 mg  1,000 mg Oral Q12H Gonzella Lex, MD   1,000 mg at 01/07/16 0807  . HYDROcodone-acetaminophen (NORCO) 7.5-325 MG per tablet 1 tablet  1 tablet Oral Q6H PRN [name], MD   1 tablet at 01/07/16 1218  . ipratropium-albuterol (DUONEB) 0.5-2.5 (3) MG/3ML nebulizer solution 3 mL  3 mL Nebulization Q4H PRN [name], MD   3 mL at 01/03/16 1450  . levofloxacin (LEVAQUIN) tablet 500 mg  500 mg Oral Daily [name], MD   500 mg at 01/07/16 0807  .  nicotine (NICODERM CQ - dosed in mg/24 hours) patch 21 mg  21 mg Transdermal Daily Alexis Hugelmeyer, DO   21 mg at 01/07/16 0808  . ondansetron (ZOFRAN) tablet 4 mg  4 mg Oral Q6H PRN Lance Coon, MD   4 mg at 01/07/16 1614   Or  . ondansetron [facility]) injection 4 mg  4 mg Intravenous Q6H PRN Lance Coon, MD   4 mg at 01/02/16 1800  . pantoprazole (PROTONIX) EC tablet 40 mg  40 mg Oral BID AC Srikar Sudini, MD   40 mg at 01/07/16 1614  . risperiDONE (RISPERDAL) tablet 1 mg  1 mg Oral BID Gonzella Lex, MD   1 mg at 01/07/16 0807  . sodium chloride flush (NS) 0.9 % injection 3 mL  3 mL Intravenous Q12H Lance Coon, MD   3 mL at 01/07/16 0808    Musculoskeletal: Strength & Muscle Tone: decreased Gait & Station: unable to stand Patient leans: N/A  Psychiatric Specialty Exam: Physical Exam  Nursing note and vitals reviewed. HENT:  Head: Normocephalic and atraumatic.  Eyes: Conjunctivae are normal. Pupils are equal, round, and reactive to  light.  Neck: Normal range of motion.  Cardiovascular: Regular rhythm and normal heart sounds.   Respiratory: No respiratory distress.  GI: Soft.  Musculoskeletal: Normal range of motion.  Neurological: She is alert.  Skin: Skin is warm and dry.     Psychiatric: Her speech is normal. Her mood appears anxious. She is not slowed. Thought content is not paranoid. Cognition and memory are impaired. She does not express impulsivity. She expresses no suicidal ideation.    Review of Systems  Constitutional: Negative.   HENT: Negative.   Eyes: Negative.   Respiratory: Positive for cough, sputum production and shortness of breath.   Cardiovascular: Negative.   Gastrointestinal: Negative.   Musculoskeletal: Negative.   Skin: Negative.   Neurological: Negative.   Psychiatric/Behavioral: Positive for memory loss. Negative for depression, hallucinations, substance abuse and suicidal ideas. The patient has insomnia. The patient is not nervous/anxious.     Blood pressure 112/73, pulse (!) 105, temperature 97.9 F (36.6 C), temperature source Oral, resp. rate 16, height '5\' 8"'$  (1.727 m), weight 71.4 kg (157 lb 8 oz), SpO2 94 %.Body mass index is 23.95 kg/m.  General Appearance: Disheveled  Eye Contact:  Minimal  Speech:  Garbled and Slow  Volume:  Decreased  Mood:  Euthymic  Affect:  Constricted  Thought Process:  Coherent  Orientation:  Full (Time, Place, and Person)  Thought Content:  Logical  Suicidal Thoughts:  No  Homicidal Thoughts:  No  Memory:  Immediate;   Fair Recent;   Fair Remote;   Fair  Judgement:  Fair  Insight:  Fair  Psychomotor Activity:  Decreased  Concentration:  Concentration: Fair  Recall:  [employer] of Knowledge:  Fair  Language:  Fair  Akathisia:  No  Handed:  Right  AIMS (if indicated):     Assets:  Financial Resources/Insurance Resilience Social Support  ADL's:  Impaired  Cognition:  Impaired,  Mild  Sleep:        Treatment Plan Summary: Daily  contact with patient to assess and evaluate symptoms and progress in treatment, Medication management and Plan Patient continues to look stable. I had increased her Depakote level by a little before the weekend. I discussed this with her. She has no complaint. Continue current medicines. I will continue to follow up while she is in the hospital. Disposition: Supportive therapy  provided about ongoing stressors.  Alethia Berthold, MD 01/07/2016 5:58 PM

## 2016-01-07 NOTE — Plan of Care (Signed)
Problem: Safety: Goal: Ability to remain free from injury will improve Outcome: Progressing Refuses bed alarm despite education and risk for fall/injury

## 2016-01-08 MED ORDER — DILTIAZEM HCL ER COATED BEADS 120 MG PO CP24
120.0000 mg | ORAL_CAPSULE | Freq: Every day | ORAL | Status: DC
  Administered 2016-01-08 – 2016-01-10 (×3): 120 mg via ORAL
  Filled 2016-01-08 (×4): qty 1

## 2016-01-08 NOTE — Plan of Care (Signed)
Problem: Safety: Goal: Ability to remain free from injury will improve Outcome: Progressing Refuses bed alarm despite education and fall/injury risk

## 2016-01-08 NOTE — Progress Notes (Signed)
[place] at [place] NAME: Charlene [name]    MR#:  [id number]  DATE OF BIRTH:  01/21/1961  SUBJECTIVE:  CHIEF COMPLAINT:  No chief complaint on file.  Dry cough. Walking with a walker   Feels much better now.  REVIEW OF SYSTEMS:    Review of Systems  Constitutional: Negative for chills, fever and weight loss.  HENT: Negative for congestion, ear pain and sore throat.   Eyes: Negative for blurred vision, photophobia and discharge.  Respiratory: Positive for cough. Negative for hemoptysis, sputum production and shortness of breath.   Cardiovascular: Positive for chest pain. Negative for palpitations, orthopnea and leg swelling.  Gastrointestinal: Negative for abdominal pain, diarrhea, nausea and vomiting.  Genitourinary: Negative for dysuria and frequency.  Musculoskeletal: Negative for myalgias.  Skin: Negative for rash.  Neurological: Negative for dizziness, tremors, focal weakness, weakness and headaches.  Psychiatric/Behavioral: Negative for depression.   DRUG ALLERGIES:   Allergies  Allergen Reactions  . Amoxicillin-Pot Clavulanate Hives    Also vomiting  . Septra [Sulfamethoxazole-Trimethoprim] Hives and Nausea And Vomiting   VITALS:  Blood pressure 108/67, pulse (!) 108, temperature 98 F (36.7 C), temperature source Oral, resp. rate 20, height '5\' 8"'$  (1.727 m), weight 71.4 kg (157 lb 8 oz), SpO2 94 %.  PHYSICAL EXAMINATION:   Physical Exam  GENERAL:  55 y.o.-year-old patient lying in the bed EYES: Pupils equal, round, reactive to light and accommodation. No scleral icterus. Extraocular muscles intact.  HEENT: Head atraumatic, normocephalic. Oropharynx and nasopharynx clear.  NECK:  Supple, no jugular venous distention. No thyroid enlargement, no tenderness.  LUNGS: Normal breath sounds bilaterally, no wheezing, rales, rhonchi. No use of accessory muscles of respiration.  CARDIOVASCULAR: S1, S2 normal. No murmurs,  rubs, or gallops.  ABDOMEN: Soft, nontender, nondistended. Bowel sounds present. No organomegaly or mass.  EXTREMITIES: No cyanosis, clubbing or edema b/l.    NEUROLOGIC: Moves all 4 extremities, follows commands. PSYCHIATRIC: The patient is alert and awake, oriented X3.  LABORATORY PANEL:   CBC  Recent Labs Lab 01/02/16 0420 01/04/16 0415  WBC 8.9  --   HGB 10.3* 9.8*  HCT 30.0*  --   PLT 168  --    ------------------------------------------------------------------------------------------------------------------ Chemistries   Recent Labs Lab 01/04/16 0415  NA 140  K 4.2  CL 102  CO2 34*  GLUCOSE 133*  BUN 15  CREATININE 0.50  CALCIUM 8.3*   ------------------------------------------------------------------------------------------------------------------  Cardiac Enzymes No results for input(s): TROPONINI in the last 168 hours. ------------------------------------------------------------------------------------------------------------------  RADIOLOGY:  No results found.   ASSESSMENT AND PLAN:   * HCAP (healthcare-associated pneumonia) with Acute hypoxic respiratory failure and sepsis  Cultures no growth Abx changed to Levaquin Nebs PRN Wean of O2 as tolerated CT chest shows no masses. B/L infiltrates.   She is on 9th day of Abx today.  I will give total 10 days. So does not need after discharge.  * Acute encephalopathy - Likely multifactorial - improved With hypoxia, bipolar. CT head showed nothing acute   *Anemia- Improved with 1 unit packed RBC transfused Likely due to chemo and anemia of chronic disease. Seen by GI and no endoscopy advised.   *Herpes zoster - zoster-like dermatomal rash in her gluteal region.   Was On Acyclovir. Finished 5 days  *Small cell carcinoma of lung [facility])  Appreciate oncology input  she have Follow up scheduled as out pt in next 2-3 weeks.  *Bipolar I disorder, most recent episode manic, severe  with psychotic  features [facility]) Psychiatry input appreciated  made changes in meds, now stable.  PPD NEGATIVE  All the records are reviewed and case discussed with Care Management/Social Workerr. Management plans discussed with the patient, family and they are in agreement.  CODE STATUS: FULL CODE  DVT Prophylaxis: SCDs  TOTAL TIME TAKING CARE OF THIS PATIENT: 35 minutes.   Discharge to assisted living facility in the next 1-2 days when bed available. Social worker is working on [employer].  Vaughan Basta M.D on 01/08/2016 at 3:38 PM  Between 7am to 6pm - Pager - [phone]  After 6pm go to [email or web address] - password [email or web address] [facility]  Office  [phone]  CC: Primary care physician; Jiles Garter, MD  Note: This dictation was prepared with Dragon dictation along with smaller phrase technology. Any transcriptional errors that result from this process are unintentional.

## 2016-01-08 NOTE — Progress Notes (Signed)
Physical Therapy Treatment Patient Details Name: Charlene [name] MRN: [id number] DOB: 02-13-1961 Today's Date: 01/08/2016    History of Present Illness Pt is a pleasant 55 y/o female admitted under IVC to behavioral health unit. Patient was admitted with agitated, combative behavior with disorganized and psychotic thinking. She has a history of cancer and recently finished radiation tx. Pt is now on medical floor with pneumonia dx.    PT Comments    Pt agreeable to PT. Notes left shoulder/upper back pain due to falls. Pt demonstrates modified independence with all bed mobility and transfers requiring increased time. Ambulation without assistive device with decreased speed and mild staggering, but no overt loss of balance. Pt notes subjective mild unsteadiness. Pt would benefit from rolling walker for community use and as needed for safety in the home. Pt participates well with stand strengthening and balance exercises with Min upper extremity support as needed. PT also given a few basic shoulder exercises for basic stabilization. OT consult recommended, as pt has pain, limited range, limited strength and functional deficits with left shoulder. Continue PT to progress strength, endurance, balance for optimal functional mobility improvement.   Follow Up Recommendations  Outpatient PT     Equipment Recommendations  Rolling walker with 5" wheels (for community use)    Recommendations for Other Services       Precautions / Restrictions Precautions Precautions: Fall Restrictions Weight Bearing Restrictions: No    Mobility  Bed Mobility Overal bed mobility: Modified Independent             General bed mobility comments: 1 hand on rail; increased time  Transfers Overall transfer level: Modified independent Equipment used: None             General transfer comment: mildly increased time; no LOB  Ambulation/Gait Ambulation/Gait assistance: Supervision Ambulation Distance  (Feet): 200 Feet Assistive device: None Gait Pattern/deviations: Step-through pattern;Decreased stride length;Staggering left;Staggering right Gait velocity: decreased Gait velocity interpretation: Below normal speed for age/gender General Gait Details: No loss of balance, mild staggering and subjective noted mild unsteady feel at times   Stairs            Wheelchair Mobility    Modified Rankin (Stroke Patients Only)       Balance Overall balance assessment: Modified Independent                                  Cognition Arousal/Alertness: Awake/alert Behavior During Therapy: WFL for tasks assessed/performed;Flat affect Overall Cognitive Status: Within Functional Limits for tasks assessed                      Exercises General Exercises - Lower Extremity Hip ABduction/ADduction: Strengthening;Both;10 reps;Standing Straight Leg Raises: Strengthening;Both;10 reps;Standing Hip Flexion/Marching: Strengthening;Both;10 reps;Standing Heel Raises: Strengthening;Both;10 reps;Standing Mini-Sqauts: Strengthening;10 reps Other Exercises Other Exercises: Stand feet together with below chest level UE movements Other Exercises: R/L side stepping, F/B walk, tandem walk Other Exercises: Backward shoulder rolls, scap retraction, L shoulder protraction/retraction seated and supine Other Exercises: Supervision to/from bathroom; independent use of bathroom    General Comments        Pertinent Vitals/Pain Pain Assessment: 0-10 Pain Score: 5  Pain Location: L upper back/shoulder Pain Descriptors / Indicators: Shooting;Sharp Pain Intervention(s): Limited activity within patient's tolerance;Premedicated before session;Monitored during session    Home Living  Prior Function            PT Goals (current goals can now be found in the care plan section) Progress towards PT goals: Progressing toward goals    Frequency  Min  2X/week    PT Plan Current plan remains appropriate    Co-evaluation             End of Session   Activity Tolerance: Patient tolerated treatment well Patient left: Other (comment) (seated edge of bed, refused alarm. Agreed to call for A)     Time: 1594-7076 PT Time Calculation (min) (ACUTE ONLY): 39 min  Charges:  $Gait Training: 8-22 mins $Therapeutic Exercise: 8-22 mins $Neuromuscular Re-education: 8-22 mins                    G Codes:      Charlaine Dalton, PTA 01/08/2016, 3:38 PM

## 2016-01-08 NOTE — Progress Notes (Signed)
Pharmacy Antibiotic Note  Charlene [name] is a 55 y.o. female admitted on 12/30/2015 with pneumonia.  Admitted to [facility]. Med 7/7- 12/30/2015 then transferred to Medical floor 7/16 d/t unresposive, drop in O2 sats.   Plan: Continue Levaquin po '500mg'$  Q24h. Plan is to continue abx for 1 more day per MD note. Will f/u d/c abx after tomorrow.   Height: '5\' 8"'$  (172.7 cm) Weight: 157 lb 8 oz (71.4 kg) IBW/kg (Calculated) : 63.9  Temp (24hrs), Avg:98.7 F (37.1 C), Min:98 F (36.7 C), Max:99.3 F (37.4 C)   Recent Labs Lab 01/01/16 1919 01/02/16 0420 01/03/16 0340 01/03/16 0345 01/04/16 0415  WBC  --  8.9  --   --   --   CREATININE  --  0.39* 0.36*  --  0.50  VANCOTROUGH 5*  --   --  12*  --     Estimated Creatinine Clearance: 81.1 mL/min (by C-G formula based on SCr of 0.8 mg/dL).    Allergies  Allergen Reactions  . Amoxicillin-Pot Clavulanate Hives    Also vomiting  . Septra [Sulfamethoxazole-Trimethoprim] Hives and Nausea And Vomiting    Antimicrobials this admission: vancomycin  >> 7/20 meropenem 7/17 >> 7/21 Azithromycin 7/17 >> 7/21 Acyclovir 7/17 >> 7/22 Levaquin 7/21 >>  Dose adjustments this admission: Vancomycin 1g q 12 to vanc 1g q 8hr  Microbiology results: 7/17 OIP:PGFQMKJI species 7/17 CXR: R atelectasis vs. Infiltrate MRSA PCR +  Thank you for allowing pharmacy to be a part of this patient's care.  Ulice Dash D, Pharm.D., BCPS Clinical Pharmacist 01/08/2016 1:19 PM

## 2016-01-08 NOTE — Progress Notes (Signed)
Initial Nutrition Assessment  DOCUMENTATION CODES:   Not applicable  INTERVENTION:   Cater to pt preferences on Regular diet order to optimize nutritional intake Will recommend Ensure Enlive if intake inadequate Recommend checking new weight   NUTRITION DIAGNOSIS:   Increased nutrient needs related to chronic illness as evidenced by estimated needs.  GOAL:   Patient will meet greater than or equal to 90% of their needs  MONITOR:   PO intake, Labs, Weight trends, I & O's  REASON FOR ASSESSMENT:   LOS    ASSESSMENT:   Pt admitted to [facility] on 12/21/2015 for manic behavior; pt with h/o bipolar disorder as well as stage IV lung cancer. Pt admitted to medical unit on 12/30/2015 with unresponsiveness and low O2. Pt with healthcare acquired pna and acute respriatory failure.  Past Medical History:  Diagnosis Date  . Asthma   . Bipolar 2 disorder ([place])   . Borderline schizophrenia   . Falls infrequently   . Hypertension   . Primary cancer of right lower lobe of lung ([place]) 07/12/2015   Small cell undifferentiated carcinoma of lung.  Diagnosis at [facility] by fine-needle aspiration of lymph node (January, 2017)    Diet Order:  Diet regular Room service appropriate?: Yes; Fluid consistency:: Thin   Pt walking the unit on multiple visits today. Per documentation pt eating 100% of meal trays. Per MST no decrease in appetite PTA.   Medications: Protonix Labs: reviewed   Gastrointestinal Profile: Last BM:  01/08/2016   Nutrition-Focused Physical Exam Findings:  Unable to complete Nutrition-Focused physical exam at this time.    Weight Change: Per [facility] weight trends, pt with weight gain   Skin:  Reviewed, no issues   Height:   Ht Readings from Last 1 Encounters:  01/01/16 '5\' 8"'$  (1.727 m)    Weight:   Wt Readings from Last 1 Encounters:  01/01/16 157 lb 8 oz (71.4 kg)   Wt Readings from Last 10 Encounters:  01/01/16 157 lb 8 oz (71.4 kg)   12/20/15 149 lb (67.6 kg)  11/08/15 149 lb 5 oz (67.7 kg)  11/01/15 142 lb 8.4 oz (64.7 kg)  11/01/15 142 lb 6.7 oz (64.6 kg)  10/03/15 142 lb 3.2 oz (64.5 kg)  08/29/15 145 lb 8 oz (66 kg)  08/08/15 151 lb 0.2 oz (68.5 kg)  07/30/15 145 lb (65.8 kg)  07/24/15 145 lb 1 oz (65.8 kg)    BMI:  Body mass index is 23.95 kg/m.  Estimated Nutritional Needs:   Kcal:  1800-2200kcals  Protein:  85-106g protein  Fluid:  >/= 1.8L fluid  EDUCATION NEEDS:   No education needs identified at this time  Dwyane Luo, RD, LDN Pager [phone] Weekend/On-Call Pager [phone]

## 2016-01-09 NOTE — Progress Notes (Signed)
[place] at [place] NAME: Charlene [name]    MR#:  [id number]  DATE OF BIRTH:  09/09/1960  SUBJECTIVE:  CHIEF COMPLAINT:  No chief complaint on file.  Feels back to baseline, waiting for placement REVIEW OF SYSTEMS:    Review of Systems  Constitutional: Negative for chills, fever and weight loss.  HENT: Negative for congestion, ear pain and sore throat.   Eyes: Negative for blurred vision, photophobia and discharge.  Respiratory: Positive for cough. Negative for hemoptysis, sputum production and shortness of breath.   Cardiovascular: Negative for palpitations, orthopnea and leg swelling.  Gastrointestinal: Negative for abdominal pain, diarrhea, nausea and vomiting.  Genitourinary: Negative for dysuria and frequency.  Musculoskeletal: Negative for myalgias.  Skin: Negative for rash.  Neurological: Negative for dizziness, tremors, focal weakness, weakness and headaches.  Psychiatric/Behavioral: Negative for depression.   DRUG ALLERGIES:   Allergies  Allergen Reactions  . Amoxicillin-Pot Clavulanate Hives    Also vomiting  . Septra [Sulfamethoxazole-Trimethoprim] Hives and Nausea And Vomiting   VITALS:  Blood pressure 99/67, pulse (!) 113, temperature 97.9 F (36.6 C), temperature source Oral, resp. rate 20, height '5\' 8"'$  (1.727 m), weight 70.9 kg (156 lb 6 oz), SpO2 96 %. PHYSICAL EXAMINATION:   Physical Exam  GENERAL:  55 y.o.-year-old patient lying in the bed EYES: Pupils equal, round, reactive to light and accommodation. No scleral icterus. Extraocular muscles intact.  HEENT: Head atraumatic, normocephalic. Oropharynx and nasopharynx clear.  NECK:  Supple, no jugular venous distention. No thyroid enlargement, no tenderness.  LUNGS: Normal breath sounds bilaterally, no wheezing, rales, rhonchi. No use of accessory muscles of respiration.  CARDIOVASCULAR: S1, S2 normal. No murmurs, rubs, or gallops.  ABDOMEN: Soft,  nontender, nondistended. Bowel sounds present. No organomegaly or mass.  EXTREMITIES: No cyanosis, clubbing or edema b/l.    NEUROLOGIC: Moves all 4 extremities, follows commands. PSYCHIATRIC: The patient is alert and awake, oriented X3.  LABORATORY PANEL:   CBC  Recent Labs Lab 01/04/16 0415  HGB 9.8*   ------------------------------------------------------------------------------------------------------------------ Chemistries   Recent Labs Lab 01/04/16 0415  NA 140  K 4.2  CL 102  CO2 34*  GLUCOSE 133*  BUN 15  CREATININE 0.50  CALCIUM 8.3*    ASSESSMENT AND PLAN:   * HCAP (healthcare-associated pneumonia) with Acute hypoxic respiratory failure and sepsis Finished course of abx, will stop it today  * Acute encephalopathy - Likely multifactorial - improved - back to baseline now.   *Anemia- Improved with 1 unit packed RBC transfused Likely due to chemo and anemia of chronic disease. Seen by GI and no endoscopy advised as an outpt   *Herpes zoster - zoster-like dermatomal rash in her gluteal region.   Was On Acyclovir. Finished 5 days  *Small cell carcinoma of lung [facility])  Appreciate oncology input  she have Follow up scheduled as out pt in next 2-3 weeks.  *Bipolar I disorder, most recent episode manic, severe with psychotic features [facility]) Psychiatry input appreciated  made changes in meds, now stable.  PPD NEGATIVE  All the records are reviewed and case discussed with Care Management/Social Worker. Management plans discussed with the patient, nursing and they are in agreement.  At this point, she is medically stable for D/C  And is waiting for facility placement and social worker is actively seeking for placement  CODE STATUS: FULL CODE  DVT Prophylaxis: SCDs  TOTAL TIME TAKING CARE OF THIS PATIENT: 35 minutes.    [place], [place]  M.D on 01/09/2016 at 3:30 PM  Between 7am to 6pm - Pager - [phone]  After 6pm go to [email or web address] - password  [email or web address]  Office  [phone]  CC: Primary care physician; Jiles Garter, MD  Note: This dictation was prepared with Dragon dictation along with smaller phrase technology. Any transcriptional errors that result from this process are unintentional.

## 2016-01-09 NOTE — Progress Notes (Signed)
Physical Therapy Treatment Patient Details Name: Charlene [name] MRN: [id number] DOB: [birth year] Today's Date: 01/09/2016    History of Present Illness Pt is a pleasant 55 y/o female admitted under IVC to behavioral health unit. Patient was admitted with agitated, combative behavior with disorganized and psychotic thinking. She has a history of cancer and recently finished radiation tx. Pt is now on medical floor with pneumonia dx.    PT Comments    Pt demonstrates good progress with physical therapist on this date. She demonstrates some lateral gait deviations with head turning during ambulation but able to self correct without LOB. Pt scored 51/56 on BERG indicating some higher level balance deficits. She remains a high fall risk due to history of repeated falls prior to admission. Pt provided with some balance exercises that she can perform at home. She is able to complete 2 laps around RN station with minimal DOE reported. Pt still awaiting placement. Pt will benefit from skilled PT services to address deficits in strength, balance, and mobility in order to return to full function at home.    Follow Up Recommendations  Outpatient PT     Equipment Recommendations  Rolling walker with 5" wheels    Recommendations for Other Services       Precautions / Restrictions Precautions Precautions: Fall Restrictions Weight Bearing Restrictions: No    Mobility  Bed Mobility Overal bed mobility: Modified Independent             General bed mobility comments: Good speed and sequencing. Able to perform sit to stand transfer without UE support  Transfers Overall transfer level: Modified independent Equipment used: None             General transfer comment: Pt able to perform sit to stand without UE support. Demonstrates fair stability once upright in standing  Ambulation/Gait Ambulation/Gait assistance: Min guard Ambulation Distance (Feet): 400 Feet Assistive device:  None Gait Pattern/deviations: Step-through pattern;Decreased step length - right;Decreased step length - left Gait velocity: Functional for full household mobility Gait velocity interpretation: Below normal speed for age/gender General Gait Details: Pt ambulated 2 laps around RN station. Performed gait speed changes as well as horizontal and vertical head turns. Challenged balance with 180 degree turns. Pt demonstrates mild lateral gait deviation with horizontal and vertical head turns. She reports mild DOE but unable to obtain accurate SaO2. Waveform is poor but pt states 88-90% SaO2. Pt reports 4/10 on BORG dyspnea scale. HR increase to approximately 108 with ambulation   Stairs            Wheelchair Mobility    Modified Rankin (Stroke Patients Only)       Balance Overall balance assessment: Needs assistance Sitting-balance support: No upper extremity supported Sitting balance-Leahy Scale: Normal     Standing balance support: No upper extremity supported Standing balance-Leahy Scale: Good   Single Leg Stance - Right Leg: 4 Single Leg Stance - Left Leg: 4     Rhomberg - Eyes Opened: 60 Rh[place]berg - Eyes Closed: 20   High Level Balance Comments: Completed BERG with patient. Performed semitandem balance with horizontal head turns alteranting LE forward. Tandem gait at side of counter. Feet together eyes closed balance practice. SLS practice at counter. Pt provided education and instruction about how to complete exercises at home after discharge.     Cognition Arousal/Alertness: Awake/alert Behavior During Therapy: WFL for tasks assessed/performed Overall Cognitive Status: Within Functional Limits for tasks assessed  Exercises      General Comments        Pertinent Vitals/Pain Pain Assessment: 0-10 Pain Score: 7  Pain Location: L lower back Pain Descriptors / Indicators: Stabbing Pain Intervention(s): Monitored during session    Home  Living                      Prior Function            PT Goals (current goals can now be found in the care plan section) Acute Rehab PT Goals Patient Stated Goal: go to ALF PT Goal Formulation: With patient Time For Goal Achievement: 01/19/16 Potential to Achieve Goals: Good Progress towards PT goals: Progressing toward goals    Frequency  Min 2X/week    PT Plan Current plan remains appropriate    Co-evaluation             End of Session Equipment Utilized During Treatment: Gait belt Activity Tolerance: Patient tolerated treatment well Patient left: in bed;with call bell/phone within reach     Time: 1425-1451 PT Time Calculation (min) (ACUTE ONLY): 26 min  Charges:  $Neuromuscular Re-education: 23-37 mins                    G Codes:     Lyndel Safe Janette Harvie PT, DPT   Brithney Bensen 01/09/2016, 5:09 PM

## 2016-01-10 NOTE — Progress Notes (Signed)
[facility] [place] NAME: Charlene [name]    MR#:  [id number]  DATE OF BIRTH:  May 31, 1961  SUBJECTIVE:  CHIEF COMPLAINT:  No chief complaint on file.  same, waiting for placement REVIEW OF SYSTEMS:    Review of Systems  Constitutional: Negative for chills, fever and weight loss.  HENT: Negative for congestion, ear pain and sore throat.   Eyes: Negative for blurred vision, photophobia and discharge.  Respiratory: Negative for hemoptysis, sputum production and shortness of breath.   Cardiovascular: Negative for palpitations, orthopnea and leg swelling.  Gastrointestinal: Negative for abdominal pain, diarrhea, nausea and vomiting.  Genitourinary: Negative for dysuria and frequency.  Musculoskeletal: Negative for myalgias.  Skin: Negative for rash.  Neurological: Negative for dizziness, tremors, focal weakness, weakness and headaches.  Psychiatric/Behavioral: Negative for depression.   DRUG ALLERGIES:   Allergies  Allergen Reactions  . Amoxicillin-Pot Clavulanate Hives    Also vomiting  . Septra [Sulfamethoxazole-Trimethoprim] Hives and Nausea And Vomiting   VITALS:  Blood pressure 110/67, pulse 100, temperature 98 F (36.7 C), temperature source Oral, resp. rate 16, height '5\' 8"'$  (1.727 m), weight 70.9 kg (156 lb 6 oz), SpO2 100 %. PHYSICAL EXAMINATION:   Physical Exam  GENERAL:  55 y.o.-year-old patient lying in the bed EYES: Pupils equal, round, reactive to light and accommodation. No scleral icterus. Extraocular muscles intact.  HEENT: Head atraumatic, normocephalic. Oropharynx and nasopharynx clear.  NECK:  Supple, no jugular venous distention. No thyroid enlargement, no tenderness.  LUNGS: Normal breath sounds bilaterally, no wheezing, rales, rhonchi. No use of accessory muscles of respiration.  CARDIOVASCULAR: S1, S2 normal. No murmurs, rubs, or gallops.  ABDOMEN: Soft, nontender, nondistended. Bowel sounds present. No  organomegaly or mass.  EXTREMITIES: No cyanosis, clubbing or edema b/l.    NEUROLOGIC: Moves all 4 extremities, follows commands. PSYCHIATRIC: The patient is alert and awake, oriented X3.  LABORATORY PANEL:   CBC  Recent Labs Lab 01/04/16 0415  HGB 9.8*   ------------------------------------------------------------------------------------------------------------------ Chemistries   Recent Labs Lab 01/04/16 0415  NA 140  K 4.2  CL 102  CO2 34*  GLUCOSE 133*  BUN 15  CREATININE 0.50  CALCIUM 8.3*    ASSESSMENT AND PLAN:   * HCAP (healthcare-associated pneumonia) with Acute hypoxic respiratory failure and sepsis Off abx  * Acute encephalopathy - Likely multifactorial - improved - back to baseline now.   *Anemia- Improved with 1 unit packed RBC transfused Likely due to chemo and anemia of chronic disease. Seen by GI and no endoscopy advised as an outpt   *Herpes zoster - zoster-like dermatomal rash in her gluteal region.   Was On Acyclovir. Finished 5 days  *Small cell carcinoma of lung [facility])  Appreciate oncology input  she have Follow up scheduled as out pt in next 2-3 weeks.  *Bipolar I disorder, most recent episode manic, severe with psychotic features [facility]) Psychiatry input appreciated  made changes in meds, now stable.  PPD NEGATIVE  All the records are reviewed and case discussed with Care Management/Social Worker. Management plans discussed with the patient, nursing and they are in agreement.  At this point, she is medically stable for D/C  And is waiting for facility placement and social worker is actively 55 seeking for placement  CODE STATUS: FULL CODE  DVT Prophylaxis: SCDs  TOTAL TIME TAKING CARE OF THIS PATIENT: 35 minutes.    [facility], Melis Trochez M.D on 01/10/2016 at 10:51 AM  Between 7am to 6pm - Pager -  [phone]  After 6pm go to [email or web address] - password [email or web address] [facility]  Office  [phone]  CC: Primary care  physician; Jiles Garter, MD  Note: This dictation was prepared with Dragon dictation along with smaller phrase technology. Any transcriptional errors that result from this process are unintentional.

## 2016-01-10 NOTE — Clinical Social Work Note (Signed)
PASARR has been obtained. Bed offer from [facility] has been extended and accepted. Pt is agreeable to discharge plan. Pt is able to discharge in the morning. Facility will provide transportation. CSW will continue to follow.   Darden Dates, MSW, LCSW  Clinical Social Worker  [phone]

## 2016-01-11 MED ORDER — DIVALPROEX SODIUM 500 MG PO DR TAB: 1000.0000 mg | DELAYED_RELEASE_TABLET | Freq: Two times a day (BID) | ORAL | 0 refills | Status: DC

## 2016-01-11 MED ORDER — RISPERIDONE 1 MG PO TABS: 1.0000 mg | ORAL_TABLET | Freq: Two times a day (BID) | ORAL | 0 refills | Status: DC

## 2016-01-11 MED ORDER — DILTIAZEM HCL ER COATED BEADS 120 MG PO CP24: 120.0000 mg | ORAL_CAPSULE | Freq: Every day | ORAL | 0 refills | Status: AC

## 2016-01-11 NOTE — Discharge Instructions (Signed)

## 2016-01-11 NOTE — Progress Notes (Signed)
Patient is medically stable for D/C to [facility] today. Per [place] [facility] home owner Karna Christmas will pick patient up between 4 and 65 today. RN aware of above. Clinical Education officer, museum (CSW) prepared D/C packet. Patient is aware of above and agreeable to the plan. Patient asked for CSW to call her father and let him know. CSW contacted patient father Jeneen Rinks and left him a Advertising account executive. Please reconsult if future social work needs arise. CSW signing off.   [employer], LCSW [phone]

## 2016-01-11 NOTE — Discharge Summary (Signed)
[facility] at [place] NAME: Charlene [name]    MR#:  [id number]  DATE OF BIRTH:  1961-05-23  DATE OF ADMISSION:  12/30/2015 ADMITTING PHYSICIAN: Hildred Priest, MD  DATE OF DISCHARGE: No discharge date for patient encounter.  PRIMARY CARE PHYSICIAN: Jiles Garter, MD    ADMISSION DIAGNOSIS:  bipolar  DISCHARGE DIAGNOSIS:  Principal Problem:   HCAP (healthcare-associated pneumonia) Active Problems:   Essential (primary) hypertension   Small cell carcinoma of lung (HCC)   Bipolar I disorder, most recent episode manic, severe with psychotic features ([place])   Acute encephalopathy   Herpes zoster   Anemia   SECONDARY DIAGNOSIS:   Past Medical History:  Diagnosis Date  . Asthma   . Bipolar 2 disorder ([place])   . Borderline schizophrenia   . Falls infrequently   . Hypertension   . Primary cancer of right lower lobe of lung ([place]) 07/12/2015   Small cell undifferentiated carcinoma of lung.  Diagnosis at [facility] by fine-needle aspiration of lymph node (January, 2017)    HOSPITAL COURSE:  55 y.o. female who admitted with Increasing lethargy and hypoxia. Patient was admitted to the behavioral health floor and later transferred as she was becoming more lethargic and was found to have pneumonia  * HCAP (healthcare-associated pneumonia) with Acute hypoxic respiratory failure and sepsis Treated.  * Acute encephalopathy - Likely multifactorial - improved - back to baseline now.   *Anemia- Improved with 1 unit packed RBC transfused Likely due to chemo and anemia of chronic disease. Seen by GI and no endoscopy advised as an outpt   *Herpes zoster - zoster-like dermatomal rash in her gluteal region.  treated  *Small cell carcinoma of lung [facility])  Appreciate oncology input  she have Follow up scheduled as out pt in next 2-3 weeks.  *Bipolar I disorder, most recent episode manic, severe with psychotic  features [facility]) Psychiatry input appreciated  made changes in meds, now stable.  PPD NEGATIVE  DISCHARGE CONDITIONS:  stable CONSULTS OBTAINED:  Treatment Team:  Gonzella Lex, MD Lloyd Huger, MD [name], MD  DRUG ALLERGIES:   Allergies  Allergen Reactions  . Amoxicillin-Pot Clavulanate Hives    Also vomiting  . Septra [Sulfamethoxazole-Trimethoprim] Hives and Nausea And Vomiting    DISCHARGE MEDICATIONS:     Medication List    STOP taking these medications   oxyCODONE-acetaminophen 7.5-325 MG tablet Commonly known as:  PERCOCET     TAKE these medications   AYR SALINE NASAL DROPS 0.65 % (Soln) Soln Generic drug:  Saline 2 drops by Each Nare route every four (4) hours as needed.   cyclobenzaprine 10 MG tablet Commonly known as:  FLEXERIL Take 10 mg by mouth.   diclofenac 75 MG EC tablet Commonly known as:  VOLTAREN Take 75 mg by mouth.   diltiazem 120 MG 24 hr capsule Commonly known as:  CARDIZEM CD Take 1 capsule (120 mg total) by mouth daily.   divalproex 500 MG DR tablet Commonly known as:  DEPAKOTE Take 2 tablets (1,000 mg total) by mouth every 12 (twelve) hours.   FIRST-DUKES MOUTHWASH Susp Take 40m by mouth, swish and swallow three times a day   fluconazole 100 MG tablet Commonly known as:  DIFLUCAN Take 1 tablet (100 mg total) by mouth daily.   fluticasone 50 MCG/ACT nasal spray Commonly known as:  FLONASE 1 spray by Each Nare route daily.   lidocaine 5 % ointment Commonly  known as:  XYLOCAINE Apply topically.   loratadine 10 MG tablet Commonly known as:  CLARITIN Take 10 mg by mouth.   nystatin 100000 UNIT/ML suspension Commonly known as:  MYCOSTATIN Take by mouth. Reported on 11/01/2015   prochlorperazine 10 MG tablet Commonly known as:  COMPAZINE Take 1 tablet (10 mg total) by mouth every 6 (six) hours as needed for nausea or vomiting.   PROVENTIL HFA 108 (90 Base) MCG/ACT inhaler Generic drug:  albuterol     risperiDONE 1 MG tablet Commonly known as:  RISPERDAL Take 1 tablet (1 mg total) by mouth 2 (two) times daily.   sucralfate 1 g tablet Commonly known as:  CARAFATE Take 1 tablet (1 g total) by mouth 4 (four) times daily. Dissolve in 2-3 tbsp warm water, swish and swallow.   TESSALON PERLES 100 MG capsule Generic drug:  benzonatate Take 100 mg by mouth.        DISCHARGE INSTRUCTIONS:    DIET:  Regular diet  DISCHARGE CONDITION:  Good  ACTIVITY:  Activity as tolerated  OXYGEN:  Home Oxygen: No.   Oxygen Delivery: room air  DISCHARGE LOCATION:  group home   If you experience worsening of your admission symptoms, develop shortness of breath, life threatening emergency, suicidal or homicidal thoughts you must seek medical attention immediately by calling 911 or calling your MD immediately  if symptoms less severe.  You Must read complete instructions/literature along with all the possible adverse reactions/side effects for all the Medicines you take and that have been prescribed to you. Take any new Medicines after you have completely understood and accpet all the possible adverse reactions/side effects.   Please note  You were cared for by a hospitalist during your hospital stay. If you have any questions about your discharge medications or the care you received while you were in the hospital after you are discharged, you can call the unit and asked to speak with the hospitalist on call if the hospitalist that took care of you is not available. Once you are discharged, your primary care physician will handle any further medical issues. Please note that NO REFILLS for any discharge medications will be authorized once you are discharged, as it is imperative that you return to your primary care physician (or establish a relationship with a primary care physician if you do not have one) for your aftercare needs so that they can reassess your need for medications and monitor your  lab values.    On the day of Discharge:  VITAL SIGNS:  Blood pressure 107/65, pulse 94, temperature 98 F (36.7 C), temperature source Oral, resp. rate 20, height '5\' 8"'$  (1.727 m), weight 70.9 kg (156 lb 6 oz), SpO2 100 %. PHYSICAL EXAMINATION:  GENERAL:  55 y.o.-year-old patient lying in the bed with no acute distress.  EYES: Pupils equal, round, reactive to light and accommodation. No scleral icterus. Extraocular muscles intact.  HEENT: Head atraumatic, normocephalic. Oropharynx and nasopharynx clear.  NECK:  Supple, no jugular venous distention. No thyroid enlargement, no tenderness.  LUNGS: Normal breath sounds bilaterally, no wheezing, rales,rhonchi or crepitation. No use of accessory muscles of respiration.  CARDIOVASCULAR: S1, S2 normal. No murmurs, rubs, or gallops.  ABDOMEN: Soft, non-tender, non-distended. Bowel sounds present. No organomegaly or mass.  EXTREMITIES: No pedal edema, cyanosis, or clubbing.  NEUROLOGIC: Cranial nerves II through XII are intact. Muscle strength 5/5 in all extremities. Sensation intact. Gait not checked.  PSYCHIATRIC: The patient is alert and oriented x 3.  SKIN: No obvious rash, lesion, or ulcer.  DATA REVIEW:     Management plans discussed with the patient, family and they are in agreement.  CODE STATUS: FULL CODE  TOTAL TIME TAKING CARE OF THIS PATIENT: 45 minutes.    [facility], Luana Tatro M.D on 01/11/2016 at 7:07 AM  Between 7am to 6pm - Pager - [phone]  After 6pm go to [email or web address] - password [email or web address] [place] [facility]  Office  [phone]  CC: Primary care physician; Jiles Garter, MD   Note: This dictation was prepared with Dragon dictation along with smaller phrase technology. Any transcriptional errors that result from this process are unintentional.

## 2016-01-11 NOTE — NC FL2 (Signed)
[place] LEVEL OF CARE SCREENING TOOL     IDENTIFICATION  Patient Name: Charlene [name] Birthdate: 24-Jun-1960 Sex: female Admission Date (Current Location): 12/30/2015  [place] and [place] Number:  Charlene [name] [id number] [place] and Address:  [facility], [address], [place], [place] [address]      Provider Number: [id number]  Attending Physician Name and Address:  Max Sane, MD  Relative Name and Phone Number:  Arelia Sneddon (father) [phone]    Current Level of Care: Hospital Recommended Level of Care: [place] Prior Approval Number:    Date Approved/Denied:   PASRR Number:   [id number]   Discharge Plan: Other (Comment)    Current Diagnoses: Patient Active Problem List   Diagnosis Date Noted  . HCAP (healthcare-associated pneumonia) 12/31/2015  . Acute encephalopathy 12/31/2015  . Herpes zoster 12/31/2015  . Anemia 12/31/2015  . Acute psychosis associated with endocrine, metabolic, or cerebrovascular disorder 12/21/2015  . Cocaine use disorder, moderate, dependence ([place]) 12/21/2015  . Bipolar I disorder, most recent episode manic, severe with psychotic features ([place]) 12/21/2015  . OCD (obsessive compulsive disorder) 12/21/2015  . PTSD (post-traumatic stress disorder) 12/21/2015  . Abnormal cells of cervix 07/30/2015  . Encounter for general adult medical examination without abnormal findings 07/28/2015  . Primary cancer of right lower lobe of lung ([place]) 07/12/2015  . At risk for noncompliance 06/29/2015  . Spasm 06/26/2015  . Essential (primary) hypertension 06/21/2015  . Lung mass 06/21/2015  . Candida infection of mouth 06/21/2015  . Small cell carcinoma of lung ([place]) 06/21/2015  . Episodic mood disorder ([place]) 10/14/2014  . Chronic obstructive pulmonary disease ([place]) 10/06/2014  . Pulmonary emphysema ([place]) 10/06/2014  . LBP (low back pain) 09/27/2014  . Tobacco use disorder 09/27/2014     Orientation RESPIRATION BLADDER Height & Weight     Self, Time, Place  Normal Continent Weight: 156 lb 6 oz (70.9 kg) Height:  '5\' 8"'$  (172.7 cm)  BEHAVIORAL SYMPTOMS/MOOD NEUROLOGICAL BOWEL NUTRITION STATUS   (None)  (None) Continent  (Normal )  AMBULATORY STATUS COMMUNICATION OF NEEDS Skin   Limited Assist Verbally Normal                       Personal Care Assistance Level of Assistance  Bathing, Feeding, Dressing, Total care Bathing Assistance: Limited assistance Feeding assistance: Limited assistance Dressing Assistance: Limited assistance Total Care Assistance: Limited assistance   Functional Limitations Info  Sight, Hearing, Speech Sight Info: Adequate Hearing Info: Adequate Speech Info: Adequate    SPECIAL CARE FACTORS FREQUENCY  PT (By licensed PT)     PT Frequency: 2x weekly               Contractures Contractures Info: Not present    Additional Factors Info  Psychotropic, Allergies   Allergies Info: Amoxicillin, Septra          Discharge Medications: Please see discharge summary for a list of discharge medications. Medication List    STOP taking these medications   oxyCODONE-acetaminophen 7.5-325 MG tablet Commonly known as:  PERCOCET    TAKE these medications   AYR SALINE NASAL DROPS 0.65 % (Soln) Soln Generic drug:  Saline 2 drops by Each Nare route every four (4) hours as needed.  cyclobenzaprine 10 MG tablet Commonly known as:  FLEXERIL Take 10 mg by mouth.  diclofenac 75 MG EC tablet Commonly known as:  VOLTAREN Take 75 mg by mouth.  diltiazem 120 MG 24 hr capsule  Commonly known as:  CARDIZEM CD Take 1 capsule (120 mg total) by mouth daily.  divalproex 500 MG DR tablet Commonly known as:  DEPAKOTE Take 2 tablets (1,000 mg total) by mouth every 12 (twelve) hours.  FIRST-DUKES MOUTHWASH Susp Take 18m by mouth, swish and swallow three times a day  fluconazole 100 MG tablet Commonly known as:  DIFLUCAN Take 1 tablet (100  mg total) by mouth daily.  fluticasone 50 MCG/ACT nasal spray Commonly known as:  FLONASE 1 spray by Each Nare route daily.  lidocaine 5 % ointment Commonly known as:  XYLOCAINE Apply topically.  loratadine 10 MG tablet Commonly known as:  CLARITIN Take 10 mg by mouth.  nystatin 100000 UNIT/ML suspension Commonly known as:  MYCOSTATIN Take by mouth. Reported on 11/01/2015  prochlorperazine 10 MG tablet Commonly known as:  COMPAZINE Take 1 tablet (10 mg total) by mouth every 6 (six) hours as needed for nausea or vomiting.  PROVENTIL HFA 108 (90 Base) MCG/ACT inhaler Generic drug:  albuterol  risperiDONE 1 MG tablet Commonly known as:  RISPERDAL Take 1 tablet (1 mg total) by mouth 2 (two) times daily.  sucralfate 1 g tablet Commonly known as:  CARAFATE Take 1 tablet (1 g total) by mouth 4 (four) times daily. Dissolve in 2-3 tbsp warm water, swish and swallow.  TESSALON PERLES 100 MG capsule Generic drug:  benzonatate Take 100 mg by mouth.       Relevant Imaging Results:  Relevant Lab Results:   Additional Information SSN: [id number] Saurav Crumble, BVeronia Beets LCSW

## 2016-01-21 ENCOUNTER — Inpatient Hospital Stay: Payer: Medicaid Other | Attending: Internal Medicine | Admitting: Internal Medicine

## 2016-01-21 ENCOUNTER — Inpatient Hospital Stay: Payer: Medicaid Other

## 2016-01-21 ENCOUNTER — Ambulatory Visit
Admission: RE | Admit: 2016-01-21 | Discharge: 2016-01-21 | Disposition: A | Discharge status: Home / Self Care | Payer: Medicaid Other | Source: Ambulatory Visit | Attending: Internal Medicine | Admitting: Internal Medicine

## 2016-01-21 DIAGNOSIS — J40 Bronchitis, not specified as acute or chronic: Secondary | ICD-10-CM | POA: Diagnosis not present

## 2016-01-21 DIAGNOSIS — R531 Weakness: Secondary | ICD-10-CM | POA: Diagnosis not present

## 2016-01-21 DIAGNOSIS — C3431 Malignant neoplasm of lower lobe, right bronchus or lung: Secondary | ICD-10-CM | POA: Diagnosis not present

## 2016-01-21 DIAGNOSIS — R42 Dizziness and giddiness: Secondary | ICD-10-CM | POA: Diagnosis not present

## 2016-01-21 DIAGNOSIS — R110 Nausea: Secondary | ICD-10-CM | POA: Diagnosis not present

## 2016-01-21 DIAGNOSIS — Z8701 Personal history of pneumonia (recurrent): Secondary | ICD-10-CM | POA: Insufficient documentation

## 2016-01-21 DIAGNOSIS — Z9221 Personal history of antineoplastic chemotherapy: Secondary | ICD-10-CM | POA: Diagnosis not present

## 2016-01-21 DIAGNOSIS — Z9181 History of falling: Secondary | ICD-10-CM | POA: Diagnosis not present

## 2016-01-21 DIAGNOSIS — J328 Other chronic sinusitis: Secondary | ICD-10-CM | POA: Insufficient documentation

## 2016-01-21 DIAGNOSIS — Z79899 Other long term (current) drug therapy: Secondary | ICD-10-CM | POA: Insufficient documentation

## 2016-01-21 DIAGNOSIS — Z923 Personal history of irradiation: Secondary | ICD-10-CM | POA: Diagnosis not present

## 2016-01-21 DIAGNOSIS — H7091 Unspecified mastoiditis, right ear: Secondary | ICD-10-CM | POA: Diagnosis not present

## 2016-01-21 DIAGNOSIS — J320 Chronic maxillary sinusitis: Secondary | ICD-10-CM | POA: Diagnosis not present

## 2016-01-21 LAB — COMPREHENSIVE METABOLIC PANEL
ALT: 7 U/L — ABNORMAL LOW (ref 14–54)
ANION GAP: 6 (ref 5–15)
AST: 12 U/L — AB (ref 15–41)
Albumin: 3.2 g/dL — ABNORMAL LOW (ref 3.5–5.0)
Alkaline Phosphatase: 79 U/L (ref 38–126)
BILIRUBIN TOTAL: 0.2 mg/dL — AB (ref 0.3–1.2)
BUN: 16 mg/dL (ref 6–20)
CO2: 29 mmol/L (ref 22–32)
Calcium: 8.7 mg/dL — ABNORMAL LOW (ref 8.9–10.3)
Chloride: 105 mmol/L (ref 101–111)
Creatinine, Ser: 0.63 mg/dL (ref 0.44–1.00)
Glucose, Bld: 84 mg/dL (ref 65–99)
POTASSIUM: 3.5 mmol/L (ref 3.5–5.1)
Sodium: 140 mmol/L (ref 135–145)
TOTAL PROTEIN: 6.8 g/dL (ref 6.5–8.1)

## 2016-01-21 LAB — CBC WITH DIFFERENTIAL/PLATELET
Basophils Absolute: 0.1 10*3/uL (ref 0–0.1)
Basophils Relative: 1 %
EOS PCT: 0 %
Eosinophils Absolute: 0 10*3/uL (ref 0–0.7)
HEMATOCRIT: 29.5 % — AB (ref 35.0–47.0)
Hemoglobin: 10 g/dL — ABNORMAL LOW (ref 12.0–16.0)
LYMPHS ABS: 0.9 10*3/uL — AB (ref 1.0–3.6)
LYMPHS PCT: 11 %
MCH: 32.8 pg (ref 26.0–34.0)
MCHC: 33.8 g/dL (ref 32.0–36.0)
MCV: 96.9 fL (ref 80.0–100.0)
MONO ABS: 0.7 10*3/uL (ref 0.2–0.9)
MONOS PCT: 8 %
NEUTROS ABS: 6.4 10*3/uL (ref 1.4–6.5)
Neutrophils Relative %: 80 %
PLATELETS: 281 10*3/uL (ref 150–440)
RBC: 3.04 MIL/uL — ABNORMAL LOW (ref 3.80–5.20)
RDW: 16 % — AB (ref 11.5–14.5)
WBC: 8.1 10*3/uL (ref 3.6–11.0)

## 2016-01-21 LAB — SAMPLE TO BLOOD BANK

## 2016-01-21 MED ORDER — GADOBENATE DIMEGLUMINE 529 MG/ML IV SOLN
13.0000 mL | Freq: Once | INTRAVENOUS | Status: AC | PRN
  Administered 2016-01-21: 13 mL via INTRAVENOUS

## 2016-01-21 NOTE — Progress Notes (Signed)
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  Needle aspiration of lymph node was positive for small cell carcinoma of lung. Clinically  Staged  As  T1 N2 M0 tumor.    2.PET scan shows localized disease.MRI of brain is negative for any metastases  3.  Chemotherapy with cis-platinum and VP-16 has been started on July 18, 2015 4.starting radiation therapy to the chest from August 23 2015 .  5.Last chemotherapy (fourth cycle) October 03, 2015 patient has finished radiation therapy.     Primary cancer of right lower lobe of lung ([place])   07/12/2015 Initial Diagnosis    Primary cancer of right lower lobe of lung [facility])      INTERVAL HISTORY:  55 year old female patient with above history of limited stage small cell lung cancer currently status post chemoradiation fusion April 2017 is here for follow-up.   Patient was recently admitted to the hospital in June for multifocal pneumonia.- She is currently in a rehabilitation. She has multiple complaints- overall feeling poorly. Positive for nausea no vomiting.  She has episodes of dizziness. Episodes of falls-this Is mechanical. Chronic shortness of breath chronic cough. No hemoptysis. Poor appetite. She complains of pain in the bones/anterior chest. She stated she has not been getting any pain medication.  REVIEW OF SYSTEMS:   A complete 10 point review of system is done which is negative for mentioned above in history of present illness  I have reviewed the past medical history, past surgical history, social history and family history with the patient and they are unchanged from previous note.  ALLERGIES:  is allergic to amoxicillin-pot clavulanate and septra  [sulfamethoxazole-trimethoprim].  MEDICATIONS:  Current Outpatient Prescriptions  Medication Sig Dispense Refill  . albuterol (PROVENTIL HFA) 108 (90 Base) MCG/ACT inhaler     . cyclobenzaprine (FLEXERIL) 10 MG tablet Take 10 mg by mouth.    . divalproex (DEPAKOTE) 500 MG DR tablet Take 2 tablets (1,000 mg total) by mouth every 12 (twelve) hours. 60 tablet 0  . fluticasone (FLONASE) 50 MCG/ACT nasal spray 1 spray by Each Nare route daily.    Marland Kitchen lidocaine (XYLOCAINE) 5 % ointment Apply topically.    Marland Kitchen loratadine (CLARITIN) 10 MG tablet Take 10 mg by mouth.    . prochlorperazine (COMPAZINE) 10 MG tablet Take 1 tablet (10 mg total) by mouth every 6 (six) hours as needed for nausea or vomiting. 15 tablet 0  . risperiDONE (RISPERDAL) 1 MG tablet Take 1 tablet (1 mg total) by mouth 2 (two) times daily. 60 tablet 0  . Saline (AYR SALINE NASAL DROPS) 0.65 % (Soln) SOLN 2 drops by Each Nare route every four (4) hours as needed.    . sucralfate (CARAFATE) 1 g tablet Take 1 tablet (1 g total) by mouth 4 (four) times daily. Dissolve in 2-3 tbsp warm water, swish and swallow. 120 tablet 3  . benzonatate (TESSALON PERLES) 100 MG capsule Take 100 mg by mouth.    . CHERATUSSIN AC 100-10 MG/5ML syrup TAKE 1 TEASPOONFUL BY MOUTH 3 TIMES A DAY AS NEEDED FOR COUGH  0  . diclofenac (VOLTAREN) 75 MG EC tablet Take 75 mg by mouth.    . diltiazem (CARDIZEM CD) 120 MG 24 hr capsule Take  1 capsule (120 mg total) by mouth daily. (Patient not taking: Reported on 01/21/2016) 30 capsule 0  . Diphenhyd-Hydrocort-Nystatin (FIRST-DUKES MOUTHWASH) SUSP Take 4m by mouth, swish and swallow three times a day (Patient not taking: Reported on 01/21/2016) 237 mL 3  . fluconazole (DIFLUCAN) 100 MG tablet Take 1 tablet (100 mg total) by mouth daily. (Patient not taking: Reported on 01/21/2016) 5 tablet 9  . nystatin (MYCOSTATIN) 100000 UNIT/ML suspension Take by mouth. Reported on 11/01/2015     No current facility-administered medications  for this visit.     PHYSICAL EXAMINATION: ECOG PERFORMANCE STATUS: 1 - Symptomatic but completely ambulatory  Vitals:   01/21/16 1041  BP: 103/72  Pulse: (!) 109  Resp: 18  Temp: 98 F (36.7 C)   Filed Weights   01/21/16 1041  Weight: 143 lb 8 oz (65.1 kg)    GENERAL:alert, no distress and comfortable; accompanied by her father. She is walking with a rolling walker.  SKIN: skin color, texture, turgor are normal, no rashes or significant lesions EYES: normal, Conjunctiva are pink and non-injected, sclera clear OROPHARYNX:no exudate, no erythema and lips, buccal mucosa, and tongue normal  NECK: supple, thyroid normal size, non-tender, without nodularity LYMPH:  no palpable lymphadenopathy in the cervical, axillary or inguinal LUNGS: clear to auscultation and percussion with normal breathing effort HEART: regular rate & rhythm and no murmurs and no lower extremity edema ABDOMEN:abdomen soft, non-tender and normal bowel sounds Musculoskeletal:no cyanosis of digits and no clubbing  NEURO: alert & oriented x 3 with fluent speech, no focal motor/sensory deficits  LABORATORY DATA:  I have reviewed the data as listed    Component Value Date/Time   NA 140 01/04/2016 0415   K 4.2 01/04/2016 0415   CL 102 01/04/2016 0415   CO2 34 (H) 01/04/2016 0415   GLUCOSE 133 (H) 01/04/2016 0415   BUN 15 01/04/2016 0415   CREATININE 0.50 01/04/2016 0415   CALCIUM 8.3 (L) 01/04/2016 0415   PROT 5.4 (L) 01/01/2016 0527   ALBUMIN 2.0 (L) 01/01/2016 0527   AST 24 01/01/2016 0527   ALT 19 01/01/2016 0527   ALKPHOS 70 01/01/2016 0527   BILITOT 0.3 01/01/2016 0527   GFRNONAA >60 01/04/2016 0415   GFRAA >60 01/04/2016 0415    No results found for: SPEP, UPEP  Lab Results  Component Value Date   WBC 8.9 01/02/2016   NEUTROABS 8.2 (H) 01/02/2016   HGB 9.8 (L) 01/04/2016   HCT 30.0 (L) 01/02/2016   MCV 98.0 01/02/2016   PLT 168 01/02/2016      Chemistry      Component Value Date/Time    NA 140 01/04/2016 0415   K 4.2 01/04/2016 0415   CL 102 01/04/2016 0415   CO2 34 (H) 01/04/2016 0415   BUN 15 01/04/2016 0415   CREATININE 0.50 01/04/2016 0415      Component Value Date/Time   CALCIUM 8.3 (L) 01/04/2016 0415   ALKPHOS 70 01/01/2016 0527   AST 24 01/01/2016 0527   ALT 19 01/01/2016 0527   BILITOT 0.3 01/01/2016 0527       RADIOGRAPHIC STUDIES: I have personally reviewed the radiological images as listed and agreed with the findings in the report. No results found.   ASSESSMENT & PLAN:  Primary cancer of right lower lobe of lung (HCC) Limited stage small cell lung cancer- s/p chemo-RT. finished April 2017 good response. Recommend CT of the chest abdomen- for evaluation.  # multifocal pneumonia- in July 2017- repeat CT  chest with contrast.   # frequent falls/ gen weakness. CT non-contrast July 2017- wnl; check MRI brain with constrast.  # back pain- try aleeve prn.   # plan follow up as planned on aug 21st or sooner based on the results of the scans.    Orders Placed This Encounter  Procedures  . MR Brain W Wo Contrast    Standing Status:   Future    Number of Occurrences:   1    Standing Expiration Date:   03/22/2017    Order Specific Question:   Reason for Exam (SYMPTOM  OR DIAGNOSIS REQUIRED)    Answer:   small cell lung cancer/ dizziness/falls.    Order Specific Question:   Preferred imaging location?    Answer:   [facility]    Order Specific Question:   Does the patient have a pacemaker or implanted devices?    Answer:   No    Order Specific Question:   What is the patient's sedation requirement?    Answer:   No Sedation  . CBC with Differential    Standing Status:   Future    Standing Expiration Date:   01/20/2017  . Comprehensive metabolic panel    Standing Status:   Future    Standing Expiration Date:   01/20/2017  . Hold Tube- Blood Bank    Standing Status:   Future    Standing Expiration Date:   01/20/2017   # 25 minutes  face-to-face with the patient discussing the above plan of care; more than 50% of time spent on prognosis/ natural history; counseling and coordination.      Cammie Sickle, MD 01/21/2016 1:22 PM

## 2016-01-21 NOTE — Progress Notes (Signed)
Patient states she has been falling a lot since she was discharged from hospital.  She is now in a group home.  States she is out of a lot of her medications.  Asking for pain medication.  Patient states she is having a CT this week and is asking for ativan prior to CT.

## 2016-01-21 NOTE — Assessment & Plan Note (Addendum)
Limited stage small cell lung cancer- s/p chemo-RT. finished April 2017 good response. Recommend CT of the chest abdomen- for evaluation.  # multifocal pneumonia- in July 2017- repeat CT chest with contrast.   # frequent falls/ gen weakness. CT non-contrast July 2017- wnl; check MRI brain with constrast.  # back pain- try aleeve prn.   # plan follow up as planned on aug 21st or sooner based on the results of the scans.

## 2016-01-24 ENCOUNTER — Encounter: Payer: Self-pay | Admitting: Radiation Oncology

## 2016-01-24 ENCOUNTER — Ambulatory Visit
Admission: RE | Admit: 2016-01-24 | Discharge: 2016-01-24 | Disposition: A | Discharge status: Home / Self Care | Payer: Medicaid Other | Source: Ambulatory Visit | Attending: Radiation Oncology | Admitting: Radiation Oncology

## 2016-01-24 VITALS — BP 111/76 | HR 108 | Temp 97.1°F | Resp 20 | Wt 145.7 lb

## 2016-01-24 DIAGNOSIS — Z85118 Personal history of other malignant neoplasm of bronchus and lung: Secondary | ICD-10-CM | POA: Diagnosis not present

## 2016-01-24 DIAGNOSIS — Z923 Personal history of irradiation: Secondary | ICD-10-CM | POA: Diagnosis not present

## 2016-01-24 DIAGNOSIS — C3491 Malignant neoplasm of unspecified part of right bronchus or lung: Secondary | ICD-10-CM

## 2016-01-24 NOTE — Progress Notes (Signed)
Radiation Oncology Follow up Note  Name: Charlene [name]   Date:   01/24/2016 MRN:  [id number] DOB: 14-Jun-1961    This 55 y.o. female presents to the clinic today for one-month follow-up status post PCI for small cell lung cancer limited stage.  REFERRING PROVIDER: Jiles Garter, MD  HPI: Patient is a 55 year old female now seen out 1 month having completed PCI for a T1 N2 M0 small cell lung cancer with a complete response with chemoradiation. She seen today in follow-up and is doing well seems slightly depressed today she's having no neurologic symptoms. Specifically denies any change in visual fields or any motor or sensory levels. She's been completely weaned from her Decadron. She specifically denies cough hemoptysis or chest tightness..  COMPLICATIONS OF TREATMENT: none  FOLLOW UP COMPLIANCE: keeps appointments   PHYSICAL EXAM:  BP 111/76   Pulse (!) 108   Temp 97.1 F (36.2 C)   Resp 20   Wt 145 lb 11.6 oz (66.1 kg)   BMI 22.16 kg/m  Well-developed well-nourished patient in NAD. HEENT reveals PERLA, EOMI, discs not visualized.  Oral cavity is clear. No oral mucosal lesions are identified. Neck is clear without evidence of cervical or supraclavicular adenopathy. Lungs are clear to A&P. Cardiac examination is essentially unremarkable with regular rate and rhythm without murmur rub or thrill. Abdomen is benign with no organomegaly or masses noted. Motor sensory and DTR levels are equal and symmetric in the upper and lower extremities. Cranial nerves II through XII are grossly intact. Proprioception is intact. No peripheral adenopathy or edema is identified. No motor or sensory levels are noted. Crude visual fields are within normal range.  RADIOLOGY RESULTS: MRI scan of the brain performed August 7 is reviewed and shows no evidence of disease  PLAN: Present time she is doing well with no evidence of disease. I'm please were overall progress. I've asked to see her back in 4-5 months  for follow-up. She continues close follow-up care with medical oncology. Patient family know to call sooner with any concerns.  I would like to take this opportunity to thank you for allowing me to participate in the care of your patient.Armstead Peaks., MD

## 2016-01-25 ENCOUNTER — Ambulatory Visit
Admission: RE | Admit: 2016-01-25 | Discharge: 2016-01-25 | Disposition: A | Discharge status: Home / Self Care | Payer: Medicaid Other | Source: Ambulatory Visit | Attending: Oncology | Admitting: Oncology

## 2016-01-25 DIAGNOSIS — M4807 Spinal stenosis, lumbosacral region: Secondary | ICD-10-CM | POA: Insufficient documentation

## 2016-01-25 DIAGNOSIS — M47897 Other spondylosis, lumbosacral region: Secondary | ICD-10-CM | POA: Insufficient documentation

## 2016-01-25 DIAGNOSIS — K769 Liver disease, unspecified: Secondary | ICD-10-CM | POA: Diagnosis not present

## 2016-01-25 DIAGNOSIS — R911 Solitary pulmonary nodule: Secondary | ICD-10-CM | POA: Diagnosis not present

## 2016-01-25 DIAGNOSIS — J90 Pleural effusion, not elsewhere classified: Secondary | ICD-10-CM | POA: Insufficient documentation

## 2016-01-25 DIAGNOSIS — M5137 Other intervertebral disc degeneration, lumbosacral region: Secondary | ICD-10-CM | POA: Insufficient documentation

## 2016-01-25 DIAGNOSIS — C3431 Malignant neoplasm of lower lobe, right bronchus or lung: Secondary | ICD-10-CM

## 2016-01-25 DIAGNOSIS — J432 Centrilobular emphysema: Secondary | ICD-10-CM | POA: Insufficient documentation

## 2016-01-25 MED ORDER — IOPAMIDOL (ISOVUE-300) INJECTION 61%
85.0000 mL | Freq: Once | INTRAVENOUS | Status: AC | PRN
  Administered 2016-01-25: 85 mL via INTRAVENOUS

## 2016-02-01 ENCOUNTER — Ambulatory Visit: Payer: Self-pay

## 2016-02-04 ENCOUNTER — Encounter: Payer: Self-pay | Admitting: Internal Medicine

## 2016-02-04 ENCOUNTER — Inpatient Hospital Stay (HOSPITAL_BASED_OUTPATIENT_CLINIC_OR_DEPARTMENT_OTHER): Payer: Medicaid Other | Admitting: Internal Medicine

## 2016-02-04 ENCOUNTER — Inpatient Hospital Stay: Payer: Medicaid Other

## 2016-02-04 VITALS — BP 97/60 | HR 108 | Temp 98.1°F | Resp 16 | Ht 68.0 in | Wt 152.2 lb

## 2016-02-04 DIAGNOSIS — R05 Cough: Secondary | ICD-10-CM

## 2016-02-04 DIAGNOSIS — R42 Dizziness and giddiness: Secondary | ICD-10-CM

## 2016-02-04 DIAGNOSIS — J40 Bronchitis, not specified as acute or chronic: Secondary | ICD-10-CM

## 2016-02-04 DIAGNOSIS — R059 Cough, unspecified: Secondary | ICD-10-CM

## 2016-02-04 DIAGNOSIS — Z9181 History of falling: Secondary | ICD-10-CM

## 2016-02-04 DIAGNOSIS — Z79899 Other long term (current) drug therapy: Secondary | ICD-10-CM

## 2016-02-04 DIAGNOSIS — R531 Weakness: Secondary | ICD-10-CM | POA: Diagnosis not present

## 2016-02-04 DIAGNOSIS — R110 Nausea: Secondary | ICD-10-CM

## 2016-02-04 DIAGNOSIS — C3431 Malignant neoplasm of lower lobe, right bronchus or lung: Secondary | ICD-10-CM | POA: Diagnosis not present

## 2016-02-04 DIAGNOSIS — J328 Other chronic sinusitis: Secondary | ICD-10-CM

## 2016-02-04 DIAGNOSIS — Z9221 Personal history of antineoplastic chemotherapy: Secondary | ICD-10-CM

## 2016-02-04 DIAGNOSIS — Z923 Personal history of irradiation: Secondary | ICD-10-CM

## 2016-02-04 DIAGNOSIS — Z8701 Personal history of pneumonia (recurrent): Secondary | ICD-10-CM

## 2016-02-04 MED ORDER — CHERATUSSIN AC 100-10 MG/5ML PO SYRP: 5.0000 mL | ORAL_SOLUTION | Freq: Four times a day (QID) | ORAL | 2 refills | Status: DC | PRN

## 2016-02-04 NOTE — Assessment & Plan Note (Addendum)
Limited stage small cell lung cancer- s/p chemo-RT. finished April 2017 good response. CT C/A/P- NED; RLL- STABLE scarring from RT.   # frequent falls/ gen weakness. MRI brain- NEG.  # chronic mastoiditis/ sinusitis- if symptomatic then ENT eval.   # cough- ? Bronchitis- recommend OTC cough syrup.   # follow up in 3 months/ CT in plan in 6 months.

## 2016-02-04 NOTE — Progress Notes (Signed)
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  Needle aspiration of lymph node was positive for small cell carcinoma of lung. Clinically  Staged  As  T1 N2 M0 tumor.    2.PET scan shows localized disease.MRI of brain is negative for any metastases  3.  Chemotherapy with cis-platinum and VP-16 has been started on July 18, 2015 4.starting radiation therapy to the chest from August 23 2015 .  5.Last chemotherapy (fourth cycle) October 03, 2015 patient has finished radiation therapy.  # AUG 2017- CT C/A/P- NED- stable RML- scarring.      Primary cancer of right lower lobe of lung ([place])   07/12/2015 Initial Diagnosis    Primary cancer of right lower lobe of lung [facility])       INTERVAL HISTORY:  55 year old female patient with above history of limited stage small cell lung cancer currently status post chemoradiation fusion April 2017 is here for follow-up/ To review the results of her restaging CAT scan.  Patient admits to chronic cough. Not any worse. No fever no chills. She continues to be in the assisted living.   Chronic shortness of breath chronic cough. No hemoptysis.   REVIEW OF SYSTEMS:   A complete 10 point review of system is done which is negative for mentioned above in history of present illness  I have reviewed the past medical history, past surgical history, social history and family history with the patient and they are unchanged from previous note.  ALLERGIES:  is allergic to amoxicillin-pot clavulanate and septra [sulfamethoxazole-trimethoprim].  MEDICATIONS:  Current Outpatient Prescriptions  Medication Sig Dispense Refill  . cyclobenzaprine (FLEXERIL) 10 MG tablet Take 10 mg by mouth.    . diltiazem  (CARDIZEM CD) 120 MG 24 hr capsule Take 1 capsule (120 mg total) by mouth daily. 30 capsule 0  . divalproex (DEPAKOTE) 500 MG DR tablet Take 2 tablets (1,000 mg total) by mouth every 12 (twelve) hours. 60 tablet 0  . fluticasone (FLONASE) 50 MCG/ACT nasal spray 1 spray by Each Nare route daily.    Marland Kitchen loratadine (CLARITIN) 10 MG tablet Take 10 mg by mouth.    . risperiDONE (RISPERDAL) 1 MG tablet Take 1 tablet (1 mg total) by mouth 2 (two) times daily. 60 tablet 0  . sucralfate (CARAFATE) 1 g tablet Take 1 tablet (1 g total) by mouth 4 (four) times daily. Dissolve in 2-3 tbsp warm water, swish and swallow. 120 tablet 3  . albuterol (PROVENTIL HFA) 108 (90 Base) MCG/ACT inhaler     . CHERATUSSIN AC 100-10 MG/5ML syrup Take 5 mLs by mouth 4 (four) times daily as needed for cough. 120 mL 2  . Diphenhyd-Hydrocort-Nystatin (FIRST-DUKES MOUTHWASH) SUSP Take 89m by mouth, swish and swallow three times a day (Patient not taking: Reported on 02/04/2016) 237 mL 3  . fluconazole (DIFLUCAN) 100 MG tablet Take 1 tablet (100 mg total) by mouth daily. (Patient not taking: Reported on 01/21/2016) 5 tablet 9  . nystatin (MYCOSTATIN) 100000 UNIT/ML suspension Take by mouth. Reported on 11/01/2015    . prochlorperazine (COMPAZINE) 10 MG tablet Take 1 tablet (10 mg total) by mouth every 6 (six) hours as needed for nausea or vomiting. (Patient not taking: Reported on 02/04/2016) 15 tablet 0  .  Saline (AYR SALINE NASAL DROPS) 0.65 % (Soln) SOLN 2 drops by Each Nare route every four (4) hours as needed.     No current facility-administered medications for this visit.     PHYSICAL EXAMINATION: ECOG PERFORMANCE STATUS: 1 - Symptomatic but completely ambulatory  Vitals:   02/04/16 1503 02/04/16 1507  BP: 94/62 97/60  Pulse: (!) 102 (!) 108  Resp: 16   Temp: 98.1 F (36.7 C)    Filed Weights   02/04/16 1503  Weight: 152 lb 3.2 oz (69 kg)    GENERAL:alert, no distress and comfortable; accompanied by her father. She  is walking herself. SKIN: skin color, texture, turgor are normal, no rashes or significant lesions EYES: normal, Conjunctiva are pink and non-injected, sclera clear OROPHARYNX:no exudate, no erythema and lips, buccal mucosa, and tongue normal  NECK: supple, thyroid normal size, non-tender, without nodularity LYMPH:  no palpable lymphadenopathy in the cervical, axillary or inguinal LUNGS: clear to auscultation and percussion with normal breathing effort HEART: regular rate & rhythm and no murmurs and no lower extremity edema ABDOMEN:abdomen soft, non-tender and normal bowel sounds Musculoskeletal:no cyanosis of digits and no clubbing  NEURO: alert & oriented x 3 with fluent speech, no focal motor/sensory deficits  LABORATORY DATA:  I have reviewed the data as listed    Component Value Date/Time   NA 140 01/21/2016 1338   K 3.5 01/21/2016 1338   CL 105 01/21/2016 1338   CO2 29 01/21/2016 1338   GLUCOSE 84 01/21/2016 1338   BUN 16 01/21/2016 1338   CREATININE 0.63 01/21/2016 1338   CALCIUM 8.7 (L) 01/21/2016 1338   PROT 6.8 01/21/2016 1338   ALBUMIN 3.2 (L) 01/21/2016 1338   AST 12 (L) 01/21/2016 1338   ALT 7 (L) 01/21/2016 1338   ALKPHOS 79 01/21/2016 1338   BILITOT 0.2 (L) 01/21/2016 1338   GFRNONAA >60 01/21/2016 1338   GFRAA >60 01/21/2016 1338    No results found for: SPEP, UPEP  Lab Results  Component Value Date   WBC 8.1 01/21/2016   NEUTROABS 6.4 01/21/2016   HGB 10.0 (L) 01/21/2016   HCT 29.5 (L) 01/21/2016   MCV 96.9 01/21/2016   PLT 281 01/21/2016      Chemistry      Component Value Date/Time   NA 140 01/21/2016 1338   K 3.5 01/21/2016 1338   CL 105 01/21/2016 1338   CO2 29 01/21/2016 1338   BUN 16 01/21/2016 1338   CREATININE 0.63 01/21/2016 1338      Component Value Date/Time   CALCIUM 8.7 (L) 01/21/2016 1338   ALKPHOS 79 01/21/2016 1338   AST 12 (L) 01/21/2016 1338   ALT 7 (L) 01/21/2016 1338   BILITOT 0.2 (L) 01/21/2016 1338        RADIOGRAPHIC STUDIES: I have personally reviewed the radiological images as listed and agreed with the findings in the report. No results found.   ASSESSMENT & PLAN:  Primary cancer of right lower lobe of lung (HCC) Limited stage small cell lung cancer- s/p chemo-RT. finished April 2017 good response. CT C/A/P- NED; RLL- STABLE scarring from RT.   # frequent falls/ gen weakness. MRI brain- NEG.  # chronic mastoiditis/ sinusitis- if symptomatic then ENT eval.   # cough- ? Bronchitis- recommend OTC cough syrup.   # follow up in 3 months/ CT in plan in 6 months.    Orders Placed This Encounter  Procedures  . CBC with Differential    Standing Status:  Future    Standing Expiration Date:   02/03/2017  . Comprehensive metabolic panel    Standing Status:   Future    Standing Expiration Date:   02/03/2017      Cammie Sickle, MD 02/04/2016 4:09 PM

## 2016-02-04 NOTE — Progress Notes (Signed)
CT and Brain MRI results needed wanted today.  No other concerns today other than results.

## 2016-02-14 ENCOUNTER — Telehealth: Payer: Self-pay | Admitting: *Deleted

## 2016-02-14 NOTE — Telephone Encounter (Signed)
Called to request that Dr Rogue Bussing do her a favor and refer her to mental health. Her doctor is at [facility] and she was recently in hospital and the [facility] will not refill her meds. She is trying to get appt with mental health, but she can get in faster if she has a referral. I told her she needs to contact hospital  Where she was admitted for referral, she states she is in assisted living and they have been trying to get her an appt for 3 days now and have had no luck.

## 2016-02-14 NOTE — Telephone Encounter (Signed)
Per Dr Rogue Bussing, deferred to PCP. Patient informed, left msg on VM

## 2016-02-29 ENCOUNTER — Inpatient Hospital Stay: Payer: Medicaid Other | Attending: Internal Medicine

## 2016-02-29 DIAGNOSIS — C3431 Malignant neoplasm of lower lobe, right bronchus or lung: Secondary | ICD-10-CM | POA: Diagnosis not present

## 2016-02-29 DIAGNOSIS — Z452 Encounter for adjustment and management of vascular access device: Secondary | ICD-10-CM | POA: Diagnosis not present

## 2016-02-29 DIAGNOSIS — Z95828 Presence of other vascular implants and grafts: Secondary | ICD-10-CM

## 2016-02-29 MED ORDER — HEPARIN SOD (PORK) LOCK FLUSH 100 UNIT/ML IV SOLN
500.0000 [IU] | Freq: Once | INTRAVENOUS | Status: AC
  Administered 2016-02-29: 500 [IU] via INTRAVENOUS

## 2016-02-29 MED ORDER — SODIUM CHLORIDE 0.9% FLUSH
10.0000 mL | INTRAVENOUS | Status: DC | PRN
  Administered 2016-02-29: 10 mL via INTRAVENOUS
  Filled 2016-02-29: qty 10

## 2016-03-11 ENCOUNTER — Encounter: Payer: Self-pay | Admitting: *Deleted

## 2016-03-11 ENCOUNTER — Other Ambulatory Visit: Payer: Self-pay | Admitting: Internal Medicine

## 2016-03-11 DIAGNOSIS — R059 Cough, unspecified: Secondary | ICD-10-CM

## 2016-03-11 DIAGNOSIS — C3431 Malignant neoplasm of lower lobe, right bronchus or lung: Secondary | ICD-10-CM

## 2016-03-11 DIAGNOSIS — R05 Cough: Secondary | ICD-10-CM

## 2016-03-11 NOTE — Progress Notes (Signed)
rx for cheratussin faxed to [facility]

## 2016-04-04 ENCOUNTER — Inpatient Hospital Stay: Payer: Medicaid Other | Attending: Internal Medicine

## 2016-04-04 DIAGNOSIS — Z452 Encounter for adjustment and management of vascular access device: Secondary | ICD-10-CM | POA: Insufficient documentation

## 2016-04-04 DIAGNOSIS — Z85118 Personal history of other malignant neoplasm of bronchus and lung: Secondary | ICD-10-CM | POA: Insufficient documentation

## 2016-04-11 ENCOUNTER — Inpatient Hospital Stay: Payer: Medicaid Other

## 2016-04-11 DIAGNOSIS — Z95828 Presence of other vascular implants and grafts: Secondary | ICD-10-CM

## 2016-04-11 DIAGNOSIS — Z85118 Personal history of other malignant neoplasm of bronchus and lung: Secondary | ICD-10-CM | POA: Diagnosis present

## 2016-04-11 DIAGNOSIS — Z452 Encounter for adjustment and management of vascular access device: Secondary | ICD-10-CM | POA: Diagnosis not present

## 2016-04-11 MED ORDER — HEPARIN SOD (PORK) LOCK FLUSH 100 UNIT/ML IV SOLN
500.0000 [IU] | Freq: Once | INTRAVENOUS | Status: AC
  Administered 2016-04-11: 500 [IU] via INTRAVENOUS

## 2016-04-11 MED ORDER — SODIUM CHLORIDE 0.9% FLUSH
10.0000 mL | INTRAVENOUS | Status: DC | PRN
  Administered 2016-04-11: 10 mL via INTRAVENOUS
  Filled 2016-04-11: qty 10

## 2016-04-15 ENCOUNTER — Encounter: Payer: Self-pay | Admitting: *Deleted

## 2016-04-16 ENCOUNTER — Encounter: Payer: Self-pay | Admitting: *Deleted

## 2016-04-16 ENCOUNTER — Encounter
Admission: RE | Disposition: A | Discharge status: Home / Self Care | Payer: Self-pay | Source: Ambulatory Visit | Attending: Unknown Physician Specialty

## 2016-04-16 ENCOUNTER — Ambulatory Visit
Admission: RE | Admit: 2016-04-16 | Discharge: 2016-04-16 | Disposition: A | Discharge status: Home / Self Care | Payer: Medicaid Other | Source: Ambulatory Visit | Attending: Unknown Physician Specialty | Admitting: Unknown Physician Specialty

## 2016-04-16 DIAGNOSIS — Z539 Procedure and treatment not carried out, unspecified reason: Secondary | ICD-10-CM | POA: Diagnosis present

## 2016-04-16 HISTORY — DX: Reserved for concepts with insufficient information to code with codable children: IMO0002

## 2016-04-16 LAB — URINE DRUG SCREEN, QUALITATIVE (ARMC ONLY)
AMPHETAMINES, UR SCREEN: NOT DETECTED
Barbiturates, Ur Screen: NOT DETECTED
Benzodiazepine, Ur Scrn: NOT DETECTED
Cannabinoid 50 Ng, Ur ~~LOC~~: NOT DETECTED
Cocaine Metabolite,Ur ~~LOC~~: POSITIVE — AB
MDMA (ECSTASY) UR SCREEN: NOT DETECTED
METHADONE SCREEN, URINE: NOT DETECTED
Opiate, Ur Screen: POSITIVE — AB
Phencyclidine (PCP) Ur S: NOT DETECTED
TRICYCLIC, UR SCREEN: POSITIVE — AB

## 2016-04-16 SURGERY — ESOPHAGOGASTRODUODENOSCOPY (EGD) WITH PROPOFOL
Anesthesia: General

## 2016-04-16 MED ORDER — SODIUM CHLORIDE 0.9 % IV SOLN: INTRAVENOUS | Status: DC

## 2016-05-07 ENCOUNTER — Inpatient Hospital Stay: Payer: Medicaid Other

## 2016-05-21 ENCOUNTER — Inpatient Hospital Stay: Payer: Medicaid Other

## 2016-05-23 ENCOUNTER — Inpatient Hospital Stay: Payer: Medicaid Other | Attending: Internal Medicine

## 2016-05-23 ENCOUNTER — Inpatient Hospital Stay (HOSPITAL_BASED_OUTPATIENT_CLINIC_OR_DEPARTMENT_OTHER): Payer: Medicaid Other | Admitting: Internal Medicine

## 2016-05-23 VITALS — BP 118/82 | HR 105 | Temp 97.3°F | Resp 18 | Wt 172.0 lb

## 2016-05-23 DIAGNOSIS — R296 Repeated falls: Secondary | ICD-10-CM | POA: Diagnosis not present

## 2016-05-23 DIAGNOSIS — R531 Weakness: Secondary | ICD-10-CM | POA: Insufficient documentation

## 2016-05-23 DIAGNOSIS — Z79899 Other long term (current) drug therapy: Secondary | ICD-10-CM | POA: Diagnosis not present

## 2016-05-23 DIAGNOSIS — C3431 Malignant neoplasm of lower lobe, right bronchus or lung: Secondary | ICD-10-CM | POA: Diagnosis not present

## 2016-05-23 DIAGNOSIS — R5383 Other fatigue: Secondary | ICD-10-CM

## 2016-05-23 DIAGNOSIS — Z9221 Personal history of antineoplastic chemotherapy: Secondary | ICD-10-CM | POA: Diagnosis not present

## 2016-05-23 DIAGNOSIS — Z923 Personal history of irradiation: Secondary | ICD-10-CM

## 2016-05-23 DIAGNOSIS — J449 Chronic obstructive pulmonary disease, unspecified: Secondary | ICD-10-CM | POA: Diagnosis not present

## 2016-05-23 DIAGNOSIS — R2681 Unsteadiness on feet: Secondary | ICD-10-CM | POA: Diagnosis not present

## 2016-05-23 DIAGNOSIS — R05 Cough: Secondary | ICD-10-CM | POA: Insufficient documentation

## 2016-05-23 LAB — CBC WITH DIFFERENTIAL/PLATELET
BASOS ABS: 0 10*3/uL (ref 0–0.1)
BASOS PCT: 0 %
EOS PCT: 1 %
Eosinophils Absolute: 0.1 10*3/uL (ref 0–0.7)
HCT: 38.6 % (ref 35.0–47.0)
Hemoglobin: 13 g/dL (ref 12.0–16.0)
Lymphocytes Relative: 16 %
Lymphs Abs: 1.3 10*3/uL (ref 1.0–3.6)
MCH: 32.1 pg (ref 26.0–34.0)
MCHC: 33.6 g/dL (ref 32.0–36.0)
MCV: 95.7 fL (ref 80.0–100.0)
MONO ABS: 0.7 10*3/uL (ref 0.2–0.9)
MONOS PCT: 8 %
Neutro Abs: 6.3 10*3/uL (ref 1.4–6.5)
Neutrophils Relative %: 75 %
PLATELETS: 176 10*3/uL (ref 150–440)
RBC: 4.04 MIL/uL (ref 3.80–5.20)
RDW: 14.1 % (ref 11.5–14.5)
WBC: 8.4 10*3/uL (ref 3.6–11.0)

## 2016-05-23 LAB — COMPREHENSIVE METABOLIC PANEL
ALBUMIN: 3.7 g/dL (ref 3.5–5.0)
ALT: 9 U/L — ABNORMAL LOW (ref 14–54)
ANION GAP: 5 (ref 5–15)
AST: 15 U/L (ref 15–41)
Alkaline Phosphatase: 53 U/L (ref 38–126)
BUN: 12 mg/dL (ref 6–20)
CHLORIDE: 101 mmol/L (ref 101–111)
CO2: 30 mmol/L (ref 22–32)
Calcium: 9.1 mg/dL (ref 8.9–10.3)
Creatinine, Ser: 0.74 mg/dL (ref 0.44–1.00)
GFR calc Af Amer: >60 mL/min (ref >60)
Glucose, Bld: 91 mg/dL (ref 65–99)
POTASSIUM: 3.6 mmol/L (ref 3.5–5.1)
Sodium: 136 mmol/L (ref 135–145)
TOTAL PROTEIN: 6.8 g/dL (ref 6.5–8.1)
Total Bilirubin: 0.4 mg/dL (ref 0.3–1.2)

## 2016-05-23 MED ORDER — GUAIFENESIN-CODEINE 100-10 MG/5ML PO SYRP: 5.0000 mL | ORAL_SOLUTION | Freq: Three times a day (TID) | ORAL | 2 refills | Status: DC | PRN

## 2016-05-23 NOTE — Assessment & Plan Note (Addendum)
Limited stage small cell lung cancer- s/p chemo-RT. finished April 2017 good response. CT C/A/P- NED; RLL- STABLE scarring from RT.   # frequent falls/ gen weakness. MRI brain- NEG. likely sec to WBRT.   # cough- ? Bronchiti/COPD- recommend OTC cough syrup.   Follow up in 3 months/  CT C/A/P- prior/ port flush.

## 2016-05-23 NOTE — Progress Notes (Signed)
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  Needle aspiration of lymph node was positive for small cell carcinoma of lung. Clinically  Staged  As  T1 N2 M0 tumor.    2.PET scan shows localized disease.MRI of brain is negative for any metastases  3.  Chemotherapy with cis-platinum and VP-16 has been started on July 18, 2015 4.starting radiation therapy to the chest from August 23 2015 .  5.Last chemotherapy (fourth cycle) October 03, 2015 patient has finished radiation therapy.  # AUG 2017- CT C/A/P- NED- stable RML- scarring.      Primary cancer of right lower lobe of lung ([place])   07/12/2015 Initial Diagnosis    Primary cancer of right lower lobe of lung [facility])       INTERVAL HISTORY:  55 year old female patient with above history of limited stage small cell lung cancer currently status post chemoradiation fusion April 2017 is here for follow-up.   Patient continues to complain of chronic cough. Not any worse. No fever no chills. Charlene [name] is living at home. Complains of chronic fatigue. Chronic gait instability. No falls.  REVIEW OF SYSTEMS:   A complete 10 point review of system is done which is negative for mentioned above in history of present illness  I have reviewed the past medical history, past surgical history, social history and family history with the patient and they are unchanged from previous note.  ALLERGIES:  is allergic to amoxicillin-pot clavulanate and septra [sulfamethoxazole-trimethoprim].  MEDICATIONS:  Current Outpatient Prescriptions  Medication Sig Dispense Refill  . cyclobenzaprine (FLEXERIL) 10 MG tablet Take 10 mg by mouth.    . diltiazem (CARDIZEM CD) 120 MG 24 hr capsule Take 1 capsule  (120 mg total) by mouth daily. 30 capsule 0  . divalproex (DEPAKOTE) 500 MG DR tablet Take 2 tablets (1,000 mg total) by mouth every 12 (twelve) hours. 60 tablet 0  . fluticasone (FLONASE) 50 MCG/ACT nasal spray 1 spray by Each Nare route daily.    Marland Kitchen guaiFENesin-codeine (ROBITUSSIN AC) 100-10 MG/5ML syrup Take 5 mLs by mouth 3 (three) times daily as needed for cough. 473 mL 2  . loratadine (CLARITIN) 10 MG tablet Take 10 mg by mouth.    Marland Kitchen omeprazole (PRILOSEC) 20 MG capsule Take 20 mg by mouth daily.    . risperiDONE (RISPERDAL) 1 MG tablet Take 1 tablet (1 mg total) by mouth 2 (two) times daily. 60 tablet 0  . Saline (AYR SALINE NASAL DROPS) 0.65 % (Soln) SOLN 2 drops by Each Nare route every four (4) hours as needed.    . sucralfate (CARAFATE) 1 g tablet Take 1 tablet (1 g total) by mouth 4 (four) times daily. Dissolve in 2-3 tbsp warm water, swish and swallow. 120 tablet 3  . tiotropium (SPIRIVA HANDIHALER) 18 MCG inhalation capsule Place 18 mcg into inhaler and inhale daily.     No current facility-administered medications for this visit.     PHYSICAL EXAMINATION: ECOG PERFORMANCE STATUS: 1 - Symptomatic but completely ambulatory  Vitals:   05/23/16 1417  BP: 118/82  Pulse: (!) 105  Resp: 18  Temp: 97.3 F (36.3 C)   Filed Weights   05/23/16 1417  Weight: 172 lb (78 kg)    GENERAL:alert,  no distress and comfortable; accompanied by Charlene [name] father. Charlene [name] is walking herself. SKIN: skin color, texture, turgor are normal, no rashes or significant lesions EYES: normal, Conjunctiva are pink and non-injected, sclera clear OROPHARYNX:no exudate, no erythema and lips, buccal mucosa, and tongue normal  NECK: supple, thyroid normal size, non-tender, without nodularity LYMPH:  no palpable lymphadenopathy in the cervical, axillary or inguinal LUNGS: clear to auscultation and percussion with normal breathing effort HEART: regular rate & rhythm and no murmurs and no lower extremity  edema ABDOMEN:abdomen soft, non-tender and normal bowel sounds Musculoskeletal:no cyanosis of digits and no clubbing  NEURO: alert & oriented x 3 with fluent speech, no focal motor/sensory deficits  LABORATORY DATA:  I have reviewed the data as listed    Component Value Date/Time   NA 136 05/23/2016 1358   K 3.6 05/23/2016 1358   CL 101 05/23/2016 1358   CO2 30 05/23/2016 1358   GLUCOSE 91 05/23/2016 1358   BUN 12 05/23/2016 1358   CREATININE 0.74 05/23/2016 1358   CALCIUM 9.1 05/23/2016 1358   PROT 6.8 05/23/2016 1358   ALBUMIN 3.7 05/23/2016 1358   AST 15 05/23/2016 1358   ALT 9 (L) 05/23/2016 1358   ALKPHOS 53 05/23/2016 1358   BILITOT 0.4 05/23/2016 1358   GFRNONAA >60 05/23/2016 1358   GFRAA >60 05/23/2016 1358    No results found for: SPEP, UPEP  Lab Results  Component Value Date   WBC 8.4 05/23/2016   NEUTROABS 6.3 05/23/2016   HGB 13.0 05/23/2016   HCT 38.6 05/23/2016   MCV 95.7 05/23/2016   PLT 176 05/23/2016      Chemistry      Component Value Date/Time   NA 136 05/23/2016 1358   K 3.6 05/23/2016 1358   CL 101 05/23/2016 1358   CO2 30 05/23/2016 1358   BUN 12 05/23/2016 1358   CREATININE 0.74 05/23/2016 1358      Component Value Date/Time   CALCIUM 9.1 05/23/2016 1358   ALKPHOS 53 05/23/2016 1358   AST 15 05/23/2016 1358   ALT 9 (L) 05/23/2016 1358   BILITOT 0.4 05/23/2016 1358       RADIOGRAPHIC STUDIES: I have personally reviewed the radiological images as listed and agreed with the findings in the report. No results found.   ASSESSMENT & PLAN:  Primary cancer of right lower lobe of lung (HCC) Limited stage small cell lung cancer- s/p chemo-RT. finished April 2017 good response. CT C/A/P- NED; RLL- STABLE scarring from RT.   # frequent falls/ gen weakness. MRI brain- NEG. likely sec to WBRT.   # cough- ? Bronchiti/COPD- recommend OTC cough syrup.   Follow up in 3 months/  CT C/A/P- prior/ port flush.    Orders Placed This Encounter   Procedures  . CT ABDOMEN PELVIS W CONTRAST    Standing Status:   Future    Standing Expiration Date:   08/22/2017    Order Specific Question:   Reason for Exam (SYMPTOM  OR DIAGNOSIS REQUIRED)    Answer:   lung cancer    Order Specific Question:   Is the patient pregnant?    Answer:   No    Order Specific Question:   Preferred imaging location?    Answer:   [facility]  . CT CHEST W CONTRAST    Standing Status:   Future    Standing Expiration Date:   07/23/2017    Order Specific Question:   Reason for Exam (SYMPTOM  OR DIAGNOSIS  REQUIRED)    Answer:   lung cancer    Order Specific Question:   Is the patient pregnant?    Answer:   No    Order Specific Question:   Preferred imaging location?    Answer:   [facility]  . CBC with Differential/Platelet    Standing Status:   Future    Standing Expiration Date:   11/21/2016  . Comprehensive metabolic panel    Standing Status:   Future    Standing Expiration Date:   11/21/2016      Cammie Sickle, MD 05/23/2016 4:35 PM

## 2016-05-23 NOTE — Progress Notes (Signed)
Patient is here for follow up, she would like a refil on her Cheratussin

## 2016-05-26 ENCOUNTER — Telehealth: Payer: Self-pay | Admitting: *Deleted

## 2016-05-26 DIAGNOSIS — C3431 Malignant neoplasm of lower lobe, right bronchus or lung: Secondary | ICD-10-CM

## 2016-05-26 MED ORDER — GUAIFENESIN-CODEINE 100-10 MG/5ML PO SYRP: 5.0000 mL | ORAL_SOLUTION | Freq: Three times a day (TID) | ORAL | 2 refills | Status: DC | PRN

## 2016-05-26 NOTE — Telephone Encounter (Signed)
Asking if we meant to order a 1 month supply of Cough syrup. Per Dr Rogue Bussing, change qty to 120 ml with 2 refills

## 2016-06-05 ENCOUNTER — Ambulatory Visit: Payer: Medicaid Other

## 2016-06-05 ENCOUNTER — Inpatient Hospital Stay: Payer: Medicaid Other

## 2016-06-05 ENCOUNTER — Encounter: Payer: Self-pay | Admitting: Radiation Oncology

## 2016-06-05 ENCOUNTER — Other Ambulatory Visit: Payer: Medicaid Other

## 2016-06-05 ENCOUNTER — Ambulatory Visit
Admission: RE | Admit: 2016-06-05 | Discharge: 2016-06-05 | Disposition: A | Discharge status: Home / Self Care | Payer: Medicaid Other | Source: Ambulatory Visit | Attending: Radiation Oncology | Admitting: Radiation Oncology

## 2016-06-05 VITALS — BP 133/86 | HR 100 | Temp 97.5°F | Resp 20 | Wt 178.5 lb

## 2016-06-05 DIAGNOSIS — Z85118 Personal history of other malignant neoplasm of bronchus and lung: Secondary | ICD-10-CM | POA: Diagnosis not present

## 2016-06-05 DIAGNOSIS — R05 Cough: Secondary | ICD-10-CM | POA: Diagnosis not present

## 2016-06-05 DIAGNOSIS — C3491 Malignant neoplasm of unspecified part of right bronchus or lung: Secondary | ICD-10-CM

## 2016-06-05 DIAGNOSIS — J329 Chronic sinusitis, unspecified: Secondary | ICD-10-CM | POA: Insufficient documentation

## 2016-06-05 DIAGNOSIS — Z923 Personal history of irradiation: Secondary | ICD-10-CM | POA: Insufficient documentation

## 2016-06-05 NOTE — Progress Notes (Unsigned)
Survivorship Care Plan visit completed.  Treatment summary reviewed and given to patient.  ASCO answers booklet reviewed and given to patient.  CARE program and Cancer Transitions discussed with patient along with other resources cancer center offers to patients and caregivers.  Patient verbalized understanding.    

## 2016-06-05 NOTE — Progress Notes (Signed)
Radiation Oncology Follow up Note  Name: Charlene [name]   Date:   06/05/2016 MRN:  [id number] DOB: [birth year]    This 55 y.o. female presents to the clinic today for 5 month follow-up status post concurrent chemoradiation and prophylactic cranial irradiation for limited stage small cell lung cancer of the right lower lobe. She is seen today in routine follow-up and is doing well. She still has a mild nonproductive cough has been put on and off on antibiotic therapy for sinus infection and mild cough. She does have some balance issues otherwise is asymptomatic.  REFERRING PROVIDER: Jiles Garter, MD  HPI: Patient is a 55 year old female now out 5 months having completed for limited stage small cell lung cancer the right lower lobe..She had repeat CT scan back in August showing significant change in reduction in size mostly in the right middle lobe.  COMPLICATIONS OF TREATMENT: none  FOLLOW UP COMPLIANCE: keeps appointments   PHYSICAL EXAM:  BP 133/86   Pulse 100   Temp 97.5 F (36.4 C)   Resp 20   Wt 178 lb 7.4 oz (81 kg)   BMI 27.14 kg/m  Well-developed well-nourished patient in NAD. HEENT reveals PERLA, EOMI, discs not visualized.  Oral cavity is clear. No oral mucosal lesions are identified. Neck is clear without evidence of cervical or supraclavicular adenopathy. Lungs are clear to A&P. Cardiac examination is essentially unremarkable with regular rate and rhythm without murmur rub or thrill. Abdomen is benign with no organomegaly or masses noted. Motor sensory and DTR levels are equal and symmetric in the upper and lower extremities. Cranial nerves II through XII are grossly intact. Proprioception is intact. No peripheral adenopathy or edema is identified. No motor or sensory levels are noted. Crude visual fields are within normal range.  RADIOLOGY RESULTS: CT scan from August reviewed and compatible with the above-stated findings   PLAN: Present time she is doing well with no  evidence of disease. I am please were overall progress. I have asked to see her back in 6 months for follow-up. She has already had ordered repeat chest CT in March and I've asked her to copy me those for my review. Patient knows to call sooner with any concerns.  I would like to take this opportunity to thank you for allowing me to participate in the care of your patient.Armstead Peaks., MD

## 2016-07-04 ENCOUNTER — Inpatient Hospital Stay: Payer: Medicaid Other

## 2016-07-11 ENCOUNTER — Inpatient Hospital Stay: Payer: Medicaid Other

## 2016-07-14 ENCOUNTER — Inpatient Hospital Stay: Payer: Medicaid Other

## 2016-07-18 ENCOUNTER — Inpatient Hospital Stay: Payer: Medicaid Other | Attending: Internal Medicine

## 2016-07-18 DIAGNOSIS — Z85118 Personal history of other malignant neoplasm of bronchus and lung: Secondary | ICD-10-CM | POA: Diagnosis present

## 2016-07-18 DIAGNOSIS — Z452 Encounter for adjustment and management of vascular access device: Secondary | ICD-10-CM | POA: Diagnosis not present

## 2016-07-18 DIAGNOSIS — C3431 Malignant neoplasm of lower lobe, right bronchus or lung: Secondary | ICD-10-CM

## 2016-07-18 MED ORDER — SODIUM CHLORIDE 0.9% FLUSH
10.0000 mL | Freq: Once | INTRAVENOUS | Status: AC
  Administered 2016-07-18: 10 mL via INTRAVENOUS
  Filled 2016-07-18: qty 10

## 2016-07-18 MED ORDER — HEPARIN SOD (PORK) LOCK FLUSH 100 UNIT/ML IV SOLN
500.0000 [IU] | Freq: Once | INTRAVENOUS | Status: AC
  Administered 2016-07-18: 500 [IU] via INTRAVENOUS

## 2016-07-18 MED ORDER — HEPARIN SOD (PORK) LOCK FLUSH 100 UNIT/ML IV SOLN
INTRAVENOUS | Status: AC
  Filled 2016-07-18: qty 5

## 2016-08-13 ENCOUNTER — Other Ambulatory Visit: Payer: Self-pay | Admitting: Internal Medicine

## 2016-08-13 DIAGNOSIS — C3431 Malignant neoplasm of lower lobe, right bronchus or lung: Secondary | ICD-10-CM

## 2016-08-13 MED ORDER — GUAIFENESIN-CODEINE 100-10 MG/5ML PO SYRP: 5.0000 mL | ORAL_SOLUTION | Freq: Two times a day (BID) | ORAL | 0 refills | Status: DC | PRN

## 2016-08-15 ENCOUNTER — Ambulatory Visit: Admission: RE | Admit: 2016-08-15 | Payer: Medicaid Other | Source: Ambulatory Visit

## 2016-08-15 ENCOUNTER — Inpatient Hospital Stay: Payer: Medicaid Other

## 2016-08-19 ENCOUNTER — Ambulatory Visit
Admission: RE | Admit: 2016-08-19 | Discharge: 2016-08-19 | Disposition: A | Discharge status: Home / Self Care | Payer: Medicaid Other | Source: Ambulatory Visit | Attending: Internal Medicine | Admitting: Internal Medicine

## 2016-08-19 DIAGNOSIS — J8410 Pulmonary fibrosis, unspecified: Secondary | ICD-10-CM | POA: Insufficient documentation

## 2016-08-19 DIAGNOSIS — C3431 Malignant neoplasm of lower lobe, right bronchus or lung: Secondary | ICD-10-CM | POA: Diagnosis not present

## 2016-08-19 MED ORDER — IOPAMIDOL (ISOVUE-300) INJECTION 61%
75.0000 mL | Freq: Once | INTRAVENOUS | Status: AC | PRN
  Administered 2016-08-19: 75 mL via INTRAVENOUS

## 2016-08-22 ENCOUNTER — Inpatient Hospital Stay (HOSPITAL_BASED_OUTPATIENT_CLINIC_OR_DEPARTMENT_OTHER): Payer: Medicaid Other | Admitting: Internal Medicine

## 2016-08-22 ENCOUNTER — Inpatient Hospital Stay: Payer: Medicaid Other | Attending: Internal Medicine

## 2016-08-22 ENCOUNTER — Inpatient Hospital Stay: Payer: Medicaid Other

## 2016-08-22 VITALS — BP 116/83 | HR 96 | Temp 97.6°F | Ht 68.0 in | Wt 182.0 lb

## 2016-08-22 DIAGNOSIS — Z9221 Personal history of antineoplastic chemotherapy: Secondary | ICD-10-CM

## 2016-08-22 DIAGNOSIS — Z85118 Personal history of other malignant neoplasm of bronchus and lung: Secondary | ICD-10-CM

## 2016-08-22 DIAGNOSIS — Z923 Personal history of irradiation: Secondary | ICD-10-CM | POA: Insufficient documentation

## 2016-08-22 DIAGNOSIS — J449 Chronic obstructive pulmonary disease, unspecified: Secondary | ICD-10-CM | POA: Diagnosis not present

## 2016-08-22 DIAGNOSIS — Z79899 Other long term (current) drug therapy: Secondary | ICD-10-CM

## 2016-08-22 DIAGNOSIS — C3431 Malignant neoplasm of lower lobe, right bronchus or lung: Secondary | ICD-10-CM

## 2016-08-22 DIAGNOSIS — R5383 Other fatigue: Secondary | ICD-10-CM

## 2016-08-22 DIAGNOSIS — Z95828 Presence of other vascular implants and grafts: Secondary | ICD-10-CM

## 2016-08-22 LAB — COMPREHENSIVE METABOLIC PANEL
ALBUMIN: 3.3 g/dL — AB (ref 3.5–5.0)
ALT: 9 U/L — AB (ref 14–54)
AST: 19 U/L (ref 15–41)
Alkaline Phosphatase: 69 U/L (ref 38–126)
Anion gap: 4 — ABNORMAL LOW (ref 5–15)
BUN: 10 mg/dL (ref 6–20)
CHLORIDE: 105 mmol/L (ref 101–111)
CO2: 29 mmol/L (ref 22–32)
CREATININE: 0.6 mg/dL (ref 0.44–1.00)
Calcium: 8.5 mg/dL — ABNORMAL LOW (ref 8.9–10.3)
GFR calc Af Amer: >60 mL/min (ref >60)
GFR calc non Af Amer: >60 mL/min (ref >60)
GLUCOSE: 103 mg/dL — AB (ref 65–99)
Potassium: 4 mmol/L (ref 3.5–5.1)
SODIUM: 138 mmol/L (ref 135–145)
Total Bilirubin: 0.4 mg/dL (ref 0.3–1.2)
Total Protein: 6.2 g/dL — ABNORMAL LOW (ref 6.5–8.1)

## 2016-08-22 LAB — CBC WITH DIFFERENTIAL/PLATELET
BASOS ABS: 0 10*3/uL (ref 0–0.1)
Basophils Relative: 0 %
EOS PCT: 1 %
Eosinophils Absolute: 0.1 10*3/uL (ref 0–0.7)
HCT: 35.2 % (ref 35.0–47.0)
Hemoglobin: 12.1 g/dL (ref 12.0–16.0)
Lymphocytes Relative: 13 %
Lymphs Abs: 1.2 10*3/uL (ref 1.0–3.6)
MCH: 32.4 pg (ref 26.0–34.0)
MCHC: 34.5 g/dL (ref 32.0–36.0)
MCV: 94 fL (ref 80.0–100.0)
Monocytes Absolute: 0.6 10*3/uL (ref 0.2–0.9)
Monocytes Relative: 7 %
NEUTROS PCT: 79 %
Neutro Abs: 7.1 10*3/uL — ABNORMAL HIGH (ref 1.4–6.5)
PLATELETS: 198 10*3/uL (ref 150–440)
RBC: 3.74 MIL/uL — AB (ref 3.80–5.20)
RDW: 13.6 % (ref 11.5–14.5)
WBC: 9.1 10*3/uL (ref 3.6–11.0)

## 2016-08-22 MED ORDER — HEPARIN SOD (PORK) LOCK FLUSH 100 UNIT/ML IV SOLN
500.0000 [IU] | Freq: Once | INTRAVENOUS | Status: AC
  Administered 2016-08-22: 500 [IU] via INTRAVENOUS

## 2016-08-22 MED ORDER — SODIUM CHLORIDE 0.9% FLUSH
10.0000 mL | INTRAVENOUS | Status: DC | PRN
  Administered 2016-08-22: 10 mL via INTRAVENOUS
  Filled 2016-08-22: qty 10

## 2016-08-22 NOTE — Progress Notes (Signed)
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  Needle aspiration of lymph node was positive for small cell carcinoma of lung. Clinically  Staged  As  T1 N2 M0 tumor.    2.PET scan shows localized disease.MRI of brain is negative for any metastases  3.  Chemotherapy with cis-platinum and VP-16 has been started on July 18, 2015 4.starting radiation therapy to the chest from August 23 2015 .  5.Last chemotherapy (fourth cycle) October 03, 2015 patient has finished radiation therapy.  # AUG 2017- CT C/A/P- NED- stable RML- scarring.      Primary cancer of right lower lobe of lung ([place])   07/12/2015 Initial Diagnosis    Primary cancer of right lower lobe of lung [facility])       INTERVAL HISTORY:  55 year old female patient with above history of limited stage small cell lung cancer currently status post chemoradiation fusion April 2017 is here for follow-up/ Review the results of her CT scan.  Patient complains of chronic intermittent cough.  No fever no chills. Denies any  unusual headaches.   Complains of chronic fatigue. Chronic gait instability. No falls. She complains of generalized weakness. She also complains of difficulty hearing on the right side.  REVIEW OF SYSTEMS:   A complete 10 point review of system is done which is negative for mentioned above in history of present illness  I have reviewed the past medical history, past surgical history, social history and family history with the patient and they are unchanged from previous note.  ALLERGIES:  is allergic to amoxicillin-pot clavulanate and septra [sulfamethoxazole-trimethoprim].  MEDICATIONS:  Current Outpatient Prescriptions  Medication Sig Dispense Refill  .  cyclobenzaprine (FLEXERIL) 10 MG tablet Take 10 mg by mouth.    . diltiazem (CARDIZEM CD) 120 MG 24 hr capsule Take 1 capsule (120 mg total) by mouth daily. 30 capsule 0  . divalproex (DEPAKOTE) 500 MG DR tablet Take 2 tablets (1,000 mg total) by mouth every 12 (twelve) hours. 60 tablet 0  . fluticasone (FLONASE) 50 MCG/ACT nasal spray 1 spray by Each Nare route daily.    Marland Kitchen loratadine (CLARITIN) 10 MG tablet Take 10 mg by mouth.    Marland Kitchen omeprazole (PRILOSEC) 20 MG capsule Take 20 mg by mouth daily.    . risperiDONE (RISPERDAL) 1 MG tablet Take 1 tablet (1 mg total) by mouth 2 (two) times daily. 60 tablet 0  . Saline (AYR SALINE NASAL DROPS) 0.65 % (Soln) SOLN 2 drops by Each Nare route every four (4) hours as needed.    . sucralfate (CARAFATE) 1 g tablet Take 1 tablet (1 g total) by mouth 4 (four) times daily. Dissolve in 2-3 tbsp warm water, swish and swallow. 120 tablet 3  . tiotropium (SPIRIVA HANDIHALER) 18 MCG inhalation capsule Place 18 mcg into inhaler and inhale daily.    Marland Kitchen guaiFENesin-codeine (ROBITUSSIN AC) 100-10 MG/5ML syrup Take 5 mLs by mouth every 12 (twelve) hours as needed for cough. (Patient not taking: Reported on 08/22/2016) 120 mL 0   No current facility-administered medications for this visit.     PHYSICAL EXAMINATION: ECOG PERFORMANCE STATUS: 1 - Symptomatic but completely ambulatory  Vitals:   08/22/16 1454  BP: 116/83  Pulse: 96  Temp:  97.6 F (36.4 C)   Filed Weights   08/22/16 1454  Weight: 182 lb (82.6 kg)    GENERAL:alert, no distress and comfortable; accompanied by her father. She is walking herself. SKIN: skin color, texture, turgor are normal, no rashes or significant lesions EYES: normal, Conjunctiva are pink and non-injected, sclera clear OROPHARYNX:no exudate, no erythema and lips, buccal mucosa, and tongue normal  NECK: supple, thyroid normal size, non-tender, without nodularity LYMPH:  no palpable lymphadenopathy in the cervical, axillary or  inguinal LUNGS: clear to auscultation and percussion with normal breathing effort HEART: regular rate & rhythm and no murmurs and no lower extremity edema ABDOMEN:abdomen soft, non-tender and normal bowel sounds Musculoskeletal:no cyanosis of digits and no clubbing  NEURO: alert & oriented x 3 with fluent speech, no focal motor/sensory deficits  LABORATORY DATA:  I have reviewed the data as listed    Component Value Date/Time   NA 138 08/22/2016 1402   K 4.0 08/22/2016 1402   CL 105 08/22/2016 1402   CO2 29 08/22/2016 1402   GLUCOSE 103 (H) 08/22/2016 1402   BUN 10 08/22/2016 1402   CREATININE 0.60 08/22/2016 1402   CALCIUM 8.5 (L) 08/22/2016 1402   PROT 6.2 (L) 08/22/2016 1402   ALBUMIN 3.3 (L) 08/22/2016 1402   AST 19 08/22/2016 1402   ALT 9 (L) 08/22/2016 1402   ALKPHOS 69 08/22/2016 1402   BILITOT 0.4 08/22/2016 1402   GFRNONAA >60 08/22/2016 1402   GFRAA >60 08/22/2016 1402    No results found for: SPEP, UPEP  Lab Results  Component Value Date   WBC 9.1 08/22/2016   NEUTROABS 7.1 (H) 08/22/2016   HGB 12.1 08/22/2016   HCT 35.2 08/22/2016   MCV 94.0 08/22/2016   PLT 198 08/22/2016      Chemistry      Component Value Date/Time   NA 138 08/22/2016 1402   K 4.0 08/22/2016 1402   CL 105 08/22/2016 1402   CO2 29 08/22/2016 1402   BUN 10 08/22/2016 1402   CREATININE 0.60 08/22/2016 1402      Component Value Date/Time   CALCIUM 8.5 (L) 08/22/2016 1402   ALKPHOS 69 08/22/2016 1402   AST 19 08/22/2016 1402   ALT 9 (L) 08/22/2016 1402   BILITOT 0.4 08/22/2016 1402       RADIOGRAPHIC STUDIES: I have personally reviewed the radiological images as listed and agreed with the findings in the report. No results found.   ASSESSMENT & PLAN:  Primary cancer of right lower lobe of lung (HCC) Limited stage small cell lung cancer- s/p chemo-RT. finished April 2017 good response. Clinically no evidence of recurrence.  CT MARCH 2018- CT C/A/P- NED; RLL- STABLE scarring  from RT.   # Recent abnormal mammogram s/p Bx- NEG for malignancy.   # s/p WBRT PCI-  gen weakness/hearing loss- stable.   # cough- ? Bronchiti/COPD- recommend OTC cough syrup.   # follow up in 4 months/CT scan prior; port flush; labs; 8 weeks- port flush.   # I reviewed the blood work- with the patient in detail; also reviewed the imaging independently [as summarized above]; and with the patient in detail.    Orders Placed This Encounter  Procedures  . CT CHEST W CONTRAST    Standing Status:   Future    Standing Expiration Date:   10/22/2017    Order Specific Question:   Reason for Exam (SYMPTOM  OR DIAGNOSIS REQUIRED)    Answer:   lung cancer  Order Specific Question:   Is the patient pregnant?    Answer:   No    Order Specific Question:   Preferred imaging location?    Answer:   [facility]  . CBC with Differential    Standing Status:   Future    Standing Expiration Date:   08/22/2017  . Comprehensive metabolic panel    Standing Status:   Future    Standing Expiration Date:   08/22/2017      Cammie Sickle, MD 08/22/2016 4:10 PM

## 2016-08-22 NOTE — Progress Notes (Signed)
Per pt, pcp scheduled a screening mammogram bilateraly breast due to 'being overdue for a mammo": (screening performed at [facility] on 08/11/16.).pt reports that screening mammo was abn. and subsquently led to a stereotactic breast biopsy on 08/15/16 at [facility].  Pt does not performed self breast exams.  Patient received a phone call while in exam room today with RN explaining that the results were negative for any signs of cancer.  Pt had a ct scan of the chest on Monday for f/u h/o of small cell lung cancer.

## 2016-08-22 NOTE — Assessment & Plan Note (Addendum)
Limited stage small cell lung cancer- s/p chemo-RT. finished April 2017 good response. Clinically no evidence of recurrence.  CT MARCH 2018- CT C/A/P- NED; RLL- STABLE scarring from RT.   # Recent abnormal mammogram s/p Bx- NEG for malignancy.   # s/p WBRT PCI-  gen weakness/hearing loss- stable.   # cough- ? Bronchiti/COPD- recommend OTC cough syrup.   # follow up in 4 months/CT scan prior; port flush; labs; 8 weeks- port flush.   # I reviewed the blood work- with the patient in detail; also reviewed the imaging independently [as summarized above]; and with the patient in detail.

## 2016-10-23 ENCOUNTER — Inpatient Hospital Stay: Payer: Medicaid Other

## 2016-10-24 ENCOUNTER — Inpatient Hospital Stay: Payer: Medicaid Other | Attending: Internal Medicine

## 2016-10-24 DIAGNOSIS — C3431 Malignant neoplasm of lower lobe, right bronchus or lung: Secondary | ICD-10-CM | POA: Insufficient documentation

## 2016-10-24 DIAGNOSIS — Z95828 Presence of other vascular implants and grafts: Secondary | ICD-10-CM

## 2016-10-24 DIAGNOSIS — Z452 Encounter for adjustment and management of vascular access device: Secondary | ICD-10-CM | POA: Diagnosis not present

## 2016-10-24 MED ORDER — SODIUM CHLORIDE 0.9% FLUSH
10.0000 mL | INTRAVENOUS | Status: DC | PRN
  Administered 2016-10-24: 10 mL via INTRAVENOUS
  Filled 2016-10-24: qty 10

## 2016-10-24 MED ORDER — HEPARIN SOD (PORK) LOCK FLUSH 100 UNIT/ML IV SOLN
500.0000 [IU] | Freq: Once | INTRAVENOUS | Status: AC
  Administered 2016-10-24: 500 [IU] via INTRAVENOUS

## 2016-11-16 ENCOUNTER — Emergency Department: Payer: Medicaid Other

## 2016-11-16 ENCOUNTER — Inpatient Hospital Stay
Admission: EM | Admit: 2016-11-16 | Discharge: 2016-11-24 | DRG: 190 | Disposition: A | Discharge status: Home / Self Care | Payer: Medicaid Other | Attending: Internal Medicine | Admitting: Internal Medicine

## 2016-11-16 ENCOUNTER — Encounter: Payer: Self-pay | Admitting: Emergency Medicine

## 2016-11-16 DIAGNOSIS — Z85118 Personal history of other malignant neoplasm of bronchus and lung: Secondary | ICD-10-CM

## 2016-11-16 DIAGNOSIS — J209 Acute bronchitis, unspecified: Secondary | ICD-10-CM | POA: Diagnosis present

## 2016-11-16 DIAGNOSIS — J9601 Acute respiratory failure with hypoxia: Secondary | ICD-10-CM | POA: Diagnosis not present

## 2016-11-16 DIAGNOSIS — Z882 Allergy status to sulfonamides status: Secondary | ICD-10-CM

## 2016-11-16 DIAGNOSIS — G9341 Metabolic encephalopathy: Secondary | ICD-10-CM | POA: Diagnosis present

## 2016-11-16 DIAGNOSIS — Z9981 Dependence on supplemental oxygen: Secondary | ICD-10-CM | POA: Diagnosis not present

## 2016-11-16 DIAGNOSIS — F21 Schizotypal disorder: Secondary | ICD-10-CM | POA: Diagnosis present

## 2016-11-16 DIAGNOSIS — R000 Tachycardia, unspecified: Secondary | ICD-10-CM | POA: Diagnosis present

## 2016-11-16 DIAGNOSIS — J9602 Acute respiratory failure with hypercapnia: Secondary | ICD-10-CM | POA: Diagnosis present

## 2016-11-16 DIAGNOSIS — Z8249 Family history of ischemic heart disease and other diseases of the circulatory system: Secondary | ICD-10-CM | POA: Diagnosis not present

## 2016-11-16 DIAGNOSIS — Z87891 Personal history of nicotine dependence: Secondary | ICD-10-CM | POA: Diagnosis not present

## 2016-11-16 DIAGNOSIS — E162 Hypoglycemia, unspecified: Secondary | ICD-10-CM | POA: Diagnosis present

## 2016-11-16 DIAGNOSIS — Z66 Do not resuscitate: Secondary | ICD-10-CM | POA: Diagnosis present

## 2016-11-16 DIAGNOSIS — R0902 Hypoxemia: Secondary | ICD-10-CM

## 2016-11-16 DIAGNOSIS — F3181 Bipolar II disorder: Secondary | ICD-10-CM | POA: Diagnosis present

## 2016-11-16 DIAGNOSIS — Z880 Allergy status to penicillin: Secondary | ICD-10-CM

## 2016-11-16 DIAGNOSIS — R5383 Other fatigue: Secondary | ICD-10-CM | POA: Diagnosis not present

## 2016-11-16 DIAGNOSIS — Z90710 Acquired absence of both cervix and uterus: Secondary | ICD-10-CM

## 2016-11-16 DIAGNOSIS — J189 Pneumonia, unspecified organism: Secondary | ICD-10-CM | POA: Diagnosis present

## 2016-11-16 DIAGNOSIS — J9690 Respiratory failure, unspecified, unspecified whether with hypoxia or hypercapnia: Secondary | ICD-10-CM

## 2016-11-16 DIAGNOSIS — Z923 Personal history of irradiation: Secondary | ICD-10-CM

## 2016-11-16 DIAGNOSIS — J440 Chronic obstructive pulmonary disease with acute lower respiratory infection: Secondary | ICD-10-CM | POA: Diagnosis present

## 2016-11-16 DIAGNOSIS — I10 Essential (primary) hypertension: Secondary | ICD-10-CM | POA: Diagnosis present

## 2016-11-16 DIAGNOSIS — G931 Anoxic brain damage, not elsewhere classified: Secondary | ICD-10-CM | POA: Diagnosis present

## 2016-11-16 DIAGNOSIS — R0602 Shortness of breath: Secondary | ICD-10-CM | POA: Diagnosis present

## 2016-11-16 DIAGNOSIS — Z79899 Other long term (current) drug therapy: Secondary | ICD-10-CM

## 2016-11-16 DIAGNOSIS — J9622 Acute and chronic respiratory failure with hypercapnia: Secondary | ICD-10-CM | POA: Diagnosis not present

## 2016-11-16 DIAGNOSIS — F419 Anxiety disorder, unspecified: Secondary | ICD-10-CM | POA: Diagnosis present

## 2016-11-16 DIAGNOSIS — Z7951 Long term (current) use of inhaled steroids: Secondary | ICD-10-CM

## 2016-11-16 DIAGNOSIS — J441 Chronic obstructive pulmonary disease with (acute) exacerbation: Secondary | ICD-10-CM | POA: Diagnosis present

## 2016-11-16 LAB — URINALYSIS, ROUTINE W REFLEX MICROSCOPIC
Bilirubin Urine: NEGATIVE
GLUCOSE, UA: NEGATIVE mg/dL
HGB URINE DIPSTICK: NEGATIVE
KETONES UR: NEGATIVE mg/dL
LEUKOCYTES UA: NEGATIVE
Nitrite: NEGATIVE
PROTEIN: NEGATIVE mg/dL
Specific Gravity, Urine: 1.002 — ABNORMAL LOW (ref 1.005–1.030)
pH: 6 (ref 5.0–8.0)

## 2016-11-16 LAB — CBC WITH DIFFERENTIAL/PLATELET
BASOS PCT: 1 %
Basophils Absolute: 0.1 10*3/uL (ref 0–0.1)
EOS ABS: 0 10*3/uL (ref 0–0.7)
EOS PCT: 0 %
HEMATOCRIT: 35.3 % (ref 35.0–47.0)
Hemoglobin: 12.2 g/dL (ref 12.0–16.0)
Lymphocytes Relative: 15 %
Lymphs Abs: 1.2 10*3/uL (ref 1.0–3.6)
MCH: 32.7 pg (ref 26.0–34.0)
MCHC: 34.5 g/dL (ref 32.0–36.0)
MCV: 94.7 fL (ref 80.0–100.0)
MONOS PCT: 12 %
Monocytes Absolute: 1 10*3/uL — ABNORMAL HIGH (ref 0.2–0.9)
Neutro Abs: 6.1 10*3/uL (ref 1.4–6.5)
Neutrophils Relative %: 72 %
Platelets: 138 10*3/uL — ABNORMAL LOW (ref 150–440)
RBC: 3.72 MIL/uL — ABNORMAL LOW (ref 3.80–5.20)
RDW: 14.4 % (ref 11.5–14.5)
WBC: 8.4 10*3/uL (ref 3.6–11.0)

## 2016-11-16 LAB — COMPREHENSIVE METABOLIC PANEL
ALBUMIN: 2.9 g/dL — AB (ref 3.5–5.0)
ALT: 12 U/L — ABNORMAL LOW (ref 14–54)
ANION GAP: 9 (ref 5–15)
AST: 32 U/L (ref 15–41)
Alkaline Phosphatase: 57 U/L (ref 38–126)
BILIRUBIN TOTAL: 0.7 mg/dL (ref 0.3–1.2)
BUN: 9 mg/dL (ref 6–20)
CO2: 26 mmol/L (ref 22–32)
Calcium: 8.3 mg/dL — ABNORMAL LOW (ref 8.9–10.3)
Chloride: 97 mmol/L — ABNORMAL LOW (ref 101–111)
Creatinine, Ser: 0.63 mg/dL (ref 0.44–1.00)
GFR calc Af Amer: >60 mL/min (ref >60)
GFR calc non Af Amer: >60 mL/min (ref >60)
GLUCOSE: 105 mg/dL — AB (ref 65–99)
POTASSIUM: 3.7 mmol/L (ref 3.5–5.1)
Sodium: 132 mmol/L — ABNORMAL LOW (ref 135–145)
TOTAL PROTEIN: 6.6 g/dL (ref 6.5–8.1)

## 2016-11-16 LAB — PROTIME-INR
INR: 1.23
PROTHROMBIN TIME: 15.6 s — AB (ref 11.4–15.2)

## 2016-11-16 LAB — TROPONIN I

## 2016-11-16 LAB — MRSA PCR SCREENING: MRSA BY PCR: POSITIVE — AB

## 2016-11-16 LAB — LACTIC ACID, PLASMA: Lactic Acid, Venous: 0.7 mmol/L (ref 0.5–1.9)

## 2016-11-16 MED ORDER — DIVALPROEX SODIUM 500 MG PO DR TAB
1000.0000 mg | DELAYED_RELEASE_TABLET | Freq: Two times a day (BID) | ORAL | Status: DC
  Administered 2016-11-16 – 2016-11-20 (×9): 1000 mg via ORAL
  Filled 2016-11-16 (×9): qty 2

## 2016-11-16 MED ORDER — HYDROCOD POLST-CPM POLST ER 10-8 MG/5ML PO SUER
5.0000 mL | Freq: Once | ORAL | Status: AC
  Administered 2016-11-16: 5 mL via ORAL
  Filled 2016-11-16: qty 5

## 2016-11-16 MED ORDER — FLUTICASONE PROPIONATE 50 MCG/ACT NA SUSP
1.0000 | Freq: Every day | NASAL | Status: DC
  Administered 2016-11-16 – 2016-11-20 (×5): 1 via NASAL
  Filled 2016-11-16: qty 16

## 2016-11-16 MED ORDER — BENZONATATE 100 MG PO CAPS
100.0000 mg | ORAL_CAPSULE | Freq: Three times a day (TID) | ORAL | Status: DC
  Administered 2016-11-16 – 2016-11-20 (×13): 100 mg via ORAL
  Filled 2016-11-16 (×13): qty 1

## 2016-11-16 MED ORDER — HYDROCOD POLST-CPM POLST ER 10-8 MG/5ML PO SUER
5.0000 mL | Freq: Two times a day (BID) | ORAL | Status: DC
  Administered 2016-11-16 – 2016-11-20 (×9): 5 mL via ORAL
  Filled 2016-11-16 (×9): qty 5

## 2016-11-16 MED ORDER — ONDANSETRON HCL 4 MG/2ML IJ SOLN: 4.0000 mg | Freq: Four times a day (QID) | INTRAMUSCULAR | Status: DC | PRN

## 2016-11-16 MED ORDER — DILTIAZEM HCL ER COATED BEADS 120 MG PO CP24
120.0000 mg | ORAL_CAPSULE | Freq: Every day | ORAL | Status: DC
  Administered 2016-11-16 – 2016-11-24 (×7): 120 mg via ORAL
  Filled 2016-11-16 (×7): qty 1

## 2016-11-16 MED ORDER — SODIUM CHLORIDE 0.9 % IV BOLUS (SEPSIS)
1000.0000 mL | Freq: Once | INTRAVENOUS | Status: AC
  Administered 2016-11-16: 1000 mL via INTRAVENOUS

## 2016-11-16 MED ORDER — IPRATROPIUM-ALBUTEROL 0.5-2.5 (3) MG/3ML IN SOLN
3.0000 mL | RESPIRATORY_TRACT | Status: DC
  Administered 2016-11-16 – 2016-11-20 (×23): 3 mL via RESPIRATORY_TRACT
  Filled 2016-11-16 (×23): qty 3

## 2016-11-16 MED ORDER — ALBUTEROL SULFATE (2.5 MG/3ML) 0.083% IN NEBU
5.0000 mg | INHALATION_SOLUTION | Freq: Once | RESPIRATORY_TRACT | Status: AC
  Administered 2016-11-16: 5 mg via RESPIRATORY_TRACT

## 2016-11-16 MED ORDER — RISPERIDONE 1 MG PO TABS
1.0000 mg | ORAL_TABLET | Freq: Two times a day (BID) | ORAL | Status: DC
  Administered 2016-11-16 – 2016-11-20 (×9): 1 mg via ORAL
  Filled 2016-11-16 (×11): qty 1

## 2016-11-16 MED ORDER — LEVOFLOXACIN IN D5W 750 MG/150ML IV SOLN
750.0000 mg | INTRAVENOUS | Status: DC
  Filled 2016-11-16: qty 150

## 2016-11-16 MED ORDER — VANCOMYCIN HCL IN DEXTROSE 1-5 GM/200ML-% IV SOLN
1000.0000 mg | Freq: Once | INTRAVENOUS | Status: AC
  Administered 2016-11-16: 1000 mg via INTRAVENOUS
  Filled 2016-11-16: qty 200

## 2016-11-16 MED ORDER — ONDANSETRON HCL 4 MG PO TABS: 4.0000 mg | ORAL_TABLET | Freq: Four times a day (QID) | ORAL | Status: DC | PRN

## 2016-11-16 MED ORDER — ADULT MULTIVITAMIN W/MINERALS CH
1.0000 | ORAL_TABLET | Freq: Every day | ORAL | Status: DC
  Administered 2016-11-16 – 2016-11-18 (×3): 1 via ORAL
  Filled 2016-11-16 (×5): qty 1

## 2016-11-16 MED ORDER — METHYLPREDNISOLONE SODIUM SUCC 125 MG IJ SOLR
60.0000 mg | Freq: Four times a day (QID) | INTRAMUSCULAR | Status: DC
  Administered 2016-11-16 – 2016-11-17 (×3): 60 mg via INTRAVENOUS
  Filled 2016-11-16 (×3): qty 2

## 2016-11-16 MED ORDER — SODIUM CHLORIDE 0.9% FLUSH
10.0000 mL | Freq: Two times a day (BID) | INTRAVENOUS | Status: DC
  Administered 2016-11-16 – 2016-11-17 (×2): 10 mL
  Administered 2016-11-17: 20 mL
  Administered 2016-11-18 (×2): 10 mL
  Administered 2016-11-19: 30 mL
  Administered 2016-11-19 – 2016-11-23 (×8): 10 mL

## 2016-11-16 MED ORDER — VITAMIN C 500 MG PO TABS
500.0000 mg | ORAL_TABLET | Freq: Every day | ORAL | Status: DC
  Administered 2016-11-16 – 2016-11-20 (×5): 500 mg via ORAL
  Filled 2016-11-16 (×5): qty 1

## 2016-11-16 MED ORDER — GUAIFENESIN-CODEINE 100-10 MG/5ML PO SOLN
5.0000 mL | Freq: Two times a day (BID) | ORAL | Status: DC | PRN
  Administered 2016-11-16: 5 mL via ORAL
  Filled 2016-11-16: qty 5

## 2016-11-16 MED ORDER — LEVOFLOXACIN IN D5W 750 MG/150ML IV SOLN
750.0000 mg | Freq: Once | INTRAVENOUS | Status: AC
  Administered 2016-11-16: 750 mg via INTRAVENOUS
  Filled 2016-11-16: qty 150

## 2016-11-16 MED ORDER — IPRATROPIUM-ALBUTEROL 0.5-2.5 (3) MG/3ML IN SOLN
3.0000 mL | Freq: Once | RESPIRATORY_TRACT | Status: AC
  Administered 2016-11-16: 3 mL via RESPIRATORY_TRACT
  Filled 2016-11-16: qty 3

## 2016-11-16 MED ORDER — ALBUTEROL SULFATE (2.5 MG/3ML) 0.083% IN NEBU
INHALATION_SOLUTION | RESPIRATORY_TRACT | Status: AC
  Filled 2016-11-16: qty 6

## 2016-11-16 MED ORDER — ACETAMINOPHEN 500 MG PO TABS
500.0000 mg | ORAL_TABLET | Freq: Four times a day (QID) | ORAL | Status: DC | PRN
  Administered 2016-11-17 – 2016-11-24 (×4): 500 mg via ORAL
  Filled 2016-11-16 (×4): qty 1

## 2016-11-16 MED ORDER — SODIUM CHLORIDE 0.9 % IV BOLUS (SEPSIS)
500.0000 mL | Freq: Once | INTRAVENOUS | Status: AC
  Administered 2016-11-16: 500 mL via INTRAVENOUS

## 2016-11-16 MED ORDER — METHYLPREDNISOLONE SODIUM SUCC 125 MG IJ SOLR
125.0000 mg | Freq: Once | INTRAMUSCULAR | Status: AC
Start: 2016-11-16 — End: 2016-11-16
  Administered 2016-11-16: 125 mg via INTRAVENOUS
  Filled 2016-11-16: qty 2

## 2016-11-16 MED ORDER — PANTOPRAZOLE SODIUM 40 MG PO TBEC
40.0000 mg | DELAYED_RELEASE_TABLET | Freq: Every day | ORAL | Status: DC
  Administered 2016-11-16 – 2016-11-24 (×8): 40 mg via ORAL
  Filled 2016-11-16 (×8): qty 1

## 2016-11-16 MED ORDER — CYCLOBENZAPRINE HCL 10 MG PO TABS
10.0000 mg | ORAL_TABLET | Freq: Two times a day (BID) | ORAL | Status: DC
  Administered 2016-11-16 – 2016-11-20 (×9): 10 mg via ORAL
  Filled 2016-11-16 (×9): qty 1

## 2016-11-16 MED ORDER — SODIUM CHLORIDE 0.9% FLUSH: 10.0000 mL | INTRAVENOUS | Status: DC | PRN

## 2016-11-16 MED ORDER — NICOTINE 21 MG/24HR TD PT24
21.0000 mg | MEDICATED_PATCH | Freq: Every day | TRANSDERMAL | Status: DC
  Filled 2016-11-16: qty 1

## 2016-11-16 MED ORDER — ENOXAPARIN SODIUM 40 MG/0.4ML ~~LOC~~ SOLN
40.0000 mg | SUBCUTANEOUS | Status: DC
  Administered 2016-11-16 – 2016-11-23 (×8): 40 mg via SUBCUTANEOUS
  Filled 2016-11-16 (×8): qty 0.4

## 2016-11-16 NOTE — ED Notes (Signed)
Patient transported to X-ray 

## 2016-11-16 NOTE — ED Notes (Signed)
Admitting MD at bedside.

## 2016-11-16 NOTE — ED Notes (Signed)
Pt having increased WOB and continues to have increased O2 requirements. Increased coarse expiratory wheezing noted. Dr Kerman Passey notified and at bedside. New orders received.

## 2016-11-16 NOTE — ED Triage Notes (Signed)
Pt has had SHOB, productive cough, and subjective fever for 3-4 days. Does not wear O2 at home, hypoxia noted here. Hx lung CA but in remission.

## 2016-11-16 NOTE — Progress Notes (Signed)
Pharmacy Antibiotic Note  Charlene [name] is a 55 y.o. female admitted on 11/16/2016 with pneumonia.  Pharmacy has been consulted for levofloxacin dosing. Patient also received vancomycin 1000 mg IV x 1 in ED  Plan: Begin levofloxacin 750 mg IV q 24 hours  Height: 5\' 8"  (172.7 cm) Weight: 170 lb (77.1 kg) IBW/kg (Calculated) : 63.9  Temp (24hrs), Avg:98.1 F (36.7 C), Min:98.1 F (36.7 C), Max:98.1 F (36.7 C)   Recent Labs Lab 11/16/16 1240  WBC 8.4  CREATININE 0.63  LATICACIDVEN 0.7    Estimated Creatinine Clearance: 86.8 mL/min (by C-G formula based on SCr of 0.63 mg/dL).    Allergies  Allergen Reactions  . Amoxicillin-Pot Clavulanate Hives    Also vomiting  . Septra [Sulfamethoxazole-Trimethoprim] Hives and Nausea And Vomiting    Antimicrobials this admission: levofloxacin 6/3 >>  vanc 6/3 x 1  Dose adjustments this admission:  Microbiology results: 6/3 BCx: sent 6/3 UCx: sent   Sputum:    MRSA PCR:   Thank you for allowing pharmacy to be a part of this patient's care.  Darrow Bussing, PharmD Pharmacy Resident 11/16/2016 4:16 PM

## 2016-11-16 NOTE — H&P (Signed)
Charlene [name] at [place] NAME: Charlene [name]    MR#:  [id number]  DATE OF BIRTH:  April 18, 1961  DATE OF ADMISSION:  11/16/2016  PRIMARY CARE PHYSICIAN: Sison, Adele Dan, MD   REQUESTING/REFERRING PHYSICIAN: Dr. Harvest Dark  CHIEF COMPLAINT:   Chief Complaint  Patient presents with  . Shortness of Breath    HISTORY OF PRESENT ILLNESS:  Charlene [name]  is a 56 y.o. female with a known history of COPD not on home oxygen, Bipolar disorder, borderline schizophrenia, hypertension, history of right lung cancer currently in remission presents to hospital secondary to worsening shortness of breath. Patient has chronic cough due to her underlying lung cancer, however her cough has become more productive and worse in the last couple of weeks. She was noted to have increasing shortness of breath with minimal ambulation. Also complains of orthopnea. No pedal edema noted. Daughter went to check on patient this morning and she was noted to be minimally responsive with bluish discoloration of her lips. Also complains of chills and increased weakness in the last couple of days. Patient was noted to be hypoxic in the emergency room and placed on oxygen. She has diffuse wheezing and chest x-ray with no acute findings noted. She is being admitted for acute respiratory failure with hypoxia.  PAST MEDICAL HISTORY:   Past Medical History:  Diagnosis Date  . Asthma   . Bipolar 2 disorder ([place])   . Borderline schizophrenia   . Falls infrequently   . Hypertension   . Primary cancer of right lower lobe of lung ([place]) 07/12/2015   Small cell undifferentiated carcinoma of lung.  Diagnosis at [facility] by fine-needle aspiration of lymph node (January, 2017)  . Ulcer     PAST SURGICAL HISTORY:   Past Surgical History:  Procedure Laterality Date  . ABDOMINAL HYSTERECTOMY    . APPENDECTOMY    . borderline schizophrenia    . chronic back pain    . NASAL SINUS  SURGERY     x2  . PERIPHERAL VASCULAR CATHETERIZATION N/A 07/30/2015   Procedure: Glori Luis Cath Insertion;  Surgeon: Algernon Huxley, MD;  Location: [place] [facility];  Service: Cardiovascular;  Laterality: N/A;  . STOMACH SURGERY      SOCIAL HISTORY:   Social History  Substance Use Topics  . Smoking status: Former Smoker    Packs/day: 0.50    Years: 30.00    Quit date: 06/17/2015  . Smokeless tobacco: Never Used  . Alcohol use No    FAMILY HISTORY:   Family History  Problem Relation Age of Onset  . Hypertension Father     DRUG ALLERGIES:   Allergies  Allergen Reactions  . Amoxicillin-Pot Clavulanate Hives    Also vomiting  . Septra [Sulfamethoxazole-Trimethoprim] Hives and Nausea And Vomiting    REVIEW OF SYSTEMS:   Review of Systems  Constitutional: Positive for chills and malaise/fatigue. Negative for fever and weight loss.  HENT: Negative for ear discharge, ear pain, hearing loss and nosebleeds.   Eyes: Negative for blurred vision, double vision and photophobia.  Respiratory: Positive for cough, shortness of breath and wheezing. Negative for hemoptysis.   Cardiovascular: Positive for orthopnea. Negative for chest pain, palpitations and leg swelling.  Gastrointestinal: Positive for nausea. Negative for abdominal pain, constipation, diarrhea, melena and vomiting.  Genitourinary: Negative for dysuria, frequency, hematuria and urgency.  Musculoskeletal: Negative for myalgias and neck pain.  Skin: Negative for rash.  Neurological: Positive for  headaches. Negative for dizziness, tingling, sensory change, speech change and focal weakness.  Endo/Heme/Allergies: Does not bruise/bleed easily.  Psychiatric/Behavioral: Negative for depression.    MEDICATIONS AT HOME:   Prior to Admission medications   Medication Sig Start Date End Date Taking? Authorizing Provider  acetaminophen (TYLENOL) 500 MG tablet Take 500 mg by mouth every 6 (six) hours as needed.   Yes [provider]  albuterol (PROVENTIL HFA;VENTOLIN HFA) 108 (90 Base) MCG/ACT inhaler Inhale 2 puffs into the lungs every 4 (four) hours as needed for wheezing or shortness of breath.   Yes [provider]  cyclobenzaprine (FLEXERIL) 10 MG tablet Take 10 mg by mouth 2 (two) times daily.  06/26/15  Yes [provider]  diltiazem (CARDIZEM CD) 120 MG 24 hr capsule Take 1 capsule (120 mg total) by mouth daily. 01/11/16  Yes Max Sane, MD  divalproex (DEPAKOTE) 500 MG DR tablet Take 2 tablets (1,000 mg total) by mouth every 12 (twelve) hours. 01/11/16  Yes Max Sane, MD  fluticasone (FLONASE) 50 MCG/ACT nasal spray 1 spray by Each Nare route daily. 07/03/15 08/23/66 Yes [provider]  guaiFENesin-codeine (ROBITUSSIN AC) 100-10 MG/5ML syrup Take 5 mLs by mouth every 12 (twelve) hours as needed for cough. 08/13/16  Yes Cammie Sickle, MD  loratadine (CLARITIN) 10 MG tablet Take 10 mg by mouth daily as needed.    Yes [provider]  Multiple Vitamin (MULTIVITAMIN) tablet Take 1 tablet by mouth daily.   Yes [provider]  omeprazole (PRILOSEC) 20 MG capsule Take 20 mg by mouth daily.   Yes [provider]  risperiDONE (RISPERDAL) 1 MG tablet Take 1 tablet (1 mg total) by mouth 2 (two) times daily. 01/11/16  Yes Max Sane, MD  Saline (AYR SALINE NASAL DROPS) 0.65 % (Soln) SOLN 2 drops by Each Nare route every four (4) hours as needed. 07/03/15  Yes [provider]  tiotropium (SPIRIVA HANDIHALER) 18 MCG inhalation capsule Place 18 mcg into inhaler and inhale daily.   Yes [provider]  vitamin C (ASCORBIC ACID) 500 MG tablet Take 500 mg by mouth daily.   Yes [provider]  sucralfate (CARAFATE) 1 g tablet Take 1 tablet (1 g total) by mouth 4 (four) times daily. Dissolve in 2-3 tbsp warm water, swish and swallow. Patient not taking: Reported on 11/16/2016 11/01/15   Forest Gleason, MD      VITAL SIGNS:  Blood pressure  (!) 113/94, pulse (!) 120, temperature 98.1 F (36.7 C), temperature source Oral, resp. rate (!) 27, height 5\' 8"  (1.727 m), weight 77.1 kg (170 lb), SpO2 95 %.  PHYSICAL EXAMINATION:   Physical Exam  GENERAL:  56 y.o.-year-old patient lying in the bed and seems to be in acute distress.  EYES: Pupils equal, round, reactive to light and accommodation. No scleral icterus. Extraocular muscles intact.  HEENT: Head atraumatic, normocephalic. Oropharynx and nasopharynx clear.  NECK:  Supple, no jugular venous distention. No thyroid enlargement, no tenderness.  LUNGS:  Diffuse scattered wheezing all over the lung fields, no rales,rhonchi or crepitation. No use of accessory muscles of respiration.  CARDIOVASCULAR: S1, S2 normal. No murmurs, rubs, or gallops.  ABDOMEN: Soft, nontender, nondistended. Bowel sounds present. No organomegaly or mass.  EXTREMITIES: No pedal edema, cyanosis, or clubbing.  NEUROLOGIC: Cranial nerves II through XII are intact. Muscle strength 5/5 in all extremities. Sensation intact. Gait not checked. Global weakness present PSYCHIATRIC: The patient is alert and oriented x 2-3.  SKIN:  No obvious rash, lesion, or ulcer.   LABORATORY PANEL:   CBC  Recent Labs Lab 11/16/16 1240  WBC 8.4  HGB 12.2  HCT 35.3  PLT 138*   ------------------------------------------------------------------------------------------------------------------  Chemistries   Recent Labs Lab 11/16/16 1240  NA 132*  K 3.7  CL 97*  CO2 26  GLUCOSE 105*  BUN 9  CREATININE 0.63  CALCIUM 8.3*  AST 32  ALT 12*  ALKPHOS 57  BILITOT 0.7   ------------------------------------------------------------------------------------------------------------------  Cardiac Enzymes  Recent Labs Lab 11/16/16 1240  TROPONINI <0.03   ------------------------------------------------------------------------------------------------------------------  RADIOLOGY:  Dg Chest 2 View  Result Date:  11/16/2016 CLINICAL DATA:  Shortness of breath EXAM: CHEST  2 VIEW COMPARISON:  Chest CT 08/19/2016 FINDINGS: Right upper lobe opacity correlating with radiation changes on prior CT. No acute airspace disease. No edema, effusion, or pneumothorax. Stable right hilar distortion. Porta catheter on the right with tip at the upper cavoatrial junction. IMPRESSION: 1. No evidence of active disease. 2. 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 Electronically Signed   By: Monte Fantasia M.D.   On: 11/16/2016 13:25    EKG:   Orders placed or performed during the hospital encounter of 11/16/16  . ED EKG  . ED EKG  . EKG 12-Lead  . EKG 12-Lead  . ED EKG 12-Lead  . ED EKG 12-Lead    IMPRESSION AND PLAN:   Charlene [name]  is a 56 y.o. female with a known history of COPD not on home oxygen, Bipolar disorder, borderline schizophrenia, hypertension, history of right lung cancer currently in remission presents to hospital secondary to worsening shortness of breath.  #1 acute respiratory failure with hypoxia-secondary to COPD exacerbation with acute bronchitis. -Chest x-ray without any infiltrates. Continue Levaquin for bronchitis due to elevated lactic acid and productive cough now. -Cough medications. IV steroids and DuoNeb's.  #2 bipolar disorder-stable, continue home medications. On Depakote and risperidone  #3 chronic sinus tachycardia-continue Cardizem. If heart rate remains elevated, change DuoNeb's to Xopenex  #4 history of lung cancer-diagnosed at [facility] in January 2017, last chemotherapy in April 2017. Follows up at the cancer center. Most recent CT chest in March 2018 with no new findings. Continue outpatient follow-up.  #5 DVT prophylaxis-Lovenox   All the records are reviewed and case discussed with ED provider. Management plans discussed with the patient, family and they are in agreement.  CODE STATUS: Full Code  TOTAL TIME TAKING CARE OF THIS PATIENT: 50 minutes.    Gladstone Lighter M.D on  11/16/2016 at 4:08 PM  Between 7am to 6pm - Pager - [phone]  After 6pm go to [email or web address] - password [email or web address] [facility]  Office  [phone]  CC: Primary care physician; Sison, Adele Dan, MD

## 2016-11-16 NOTE — ED Provider Notes (Signed)
[facility] Emergency Department Provider Note  Time seen: 12:48 PM  I have reviewed the triage vital signs and the nursing notes.   HISTORY  Chief Complaint Shortness of Breath    HPI Charlene [name] is a 56 y.o. female with a past medical history of bipolar, hypertension, lung cancer status post radiation/chemotherapy now in remission, who presents to the emergency department for 5 days of cough, congestion, generalized fatigue and weakness. According to the patient for the past 5 days she has been coughing with minimal sputum production, has felt very weak and has been lying in bed most days. Patient has a room air saturation currently of 87% with no home O2 requirement. States no chest pain, no abdominal pain, nausea vomiting or diarrhea. Subjective fever yesterday but has not measured a temperature.  Past Medical History:  Diagnosis Date  . Asthma   . Bipolar 2 disorder ([place])   . Borderline schizophrenia   . Falls infrequently   . Hypertension   . Primary cancer of right lower lobe of lung ([place]) 07/12/2015   Small cell undifferentiated carcinoma of lung.  Diagnosis at [facility] by fine-needle aspiration of lymph node (January, 2017)  . Ulcer     Patient Active Problem List   Diagnosis Date Noted  . HCAP (healthcare-associated pneumonia) 12/31/2015  . Acute encephalopathy 12/31/2015  . Herpes zoster 12/31/2015  . Anemia 12/31/2015  . Acute psychosis associated with endocrine, metabolic, or cerebrovascular disorder 12/21/2015  . Cocaine use disorder, moderate, dependence ([place]) 12/21/2015  . Bipolar I disorder, most recent episode manic, severe with psychotic features ([place]) 12/21/2015  . OCD (obsessive compulsive disorder) 12/21/2015  . PTSD (post-traumatic stress disorder) 12/21/2015  . Abnormal cells of cervix 07/30/2015  . Encounter for general adult medical examination without abnormal findings 07/28/2015  . Primary cancer of right lower lobe  of lung ([place]) 07/12/2015  . At risk for noncompliance 06/29/2015  . Spasm 06/26/2015  . Essential (primary) hypertension 06/21/2015  . Lung mass 06/21/2015  . Candida infection of mouth 06/21/2015  . Episodic mood disorder ([place]) 10/14/2014  . Chronic obstructive pulmonary disease ([place]) 10/06/2014  . Pulmonary emphysema ([place]) 10/06/2014  . LBP (low back pain) 09/27/2014  . Tobacco use disorder 09/27/2014    Past Surgical History:  Procedure Laterality Date  . ABDOMINAL HYSTERECTOMY    . APPENDECTOMY    . borderline schizophrenia    . chronic back pain    . NASAL SINUS SURGERY     x2  . PERIPHERAL VASCULAR CATHETERIZATION N/A 07/30/2015   Procedure: Glori Luis Cath Insertion;  Surgeon: Algernon Huxley, MD;  Location: [facility];  Service: Cardiovascular;  Laterality: N/A;  . STOMACH SURGERY      Prior to Admission medications   Medication Sig Start Date End Date Taking? Authorizing Provider  cyclobenzaprine (FLEXERIL) 10 MG tablet Take 10 mg by mouth. 06/26/15   [provider]  diltiazem (CARDIZEM CD) 120 MG 24 hr capsule Take 1 capsule (120 mg total) by mouth daily. 01/11/16   Max Sane, MD  divalproex (DEPAKOTE) 500 MG DR tablet Take 2 tablets (1,000 mg total) by mouth every 12 (twelve) hours. 01/11/16   Max Sane, MD  fluticasone (FLONASE) 50 MCG/ACT nasal spray 1 spray by Each Nare route daily. 07/03/15 08/23/66  [provider]  guaiFENesin-codeine (ROBITUSSIN AC) 100-10 MG/5ML syrup Take 5 mLs by mouth every 12 (twelve) hours as needed for cough. Patient not taking: Reported on 08/22/2016 08/13/16  Cammie Sickle, MD  loratadine (CLARITIN) 10 MG tablet Take 10 mg by mouth.    [provider]  omeprazole (PRILOSEC) 20 MG capsule Take 20 mg by mouth daily.    [provider]  risperiDONE (RISPERDAL) 1 MG tablet Take 1 tablet (1 mg total) by mouth 2 (two) times daily. 01/11/16   Max Sane, MD  Saline (AYR SALINE NASAL DROPS) 0.65 % (Soln)  SOLN 2 drops by Each Nare route every four (4) hours as needed. 07/03/15   [provider]  sucralfate (CARAFATE) 1 g tablet Take 1 tablet (1 g total) by mouth 4 (four) times daily. Dissolve in 2-3 tbsp warm water, swish and swallow. 11/01/15   Forest Gleason, MD  tiotropium (SPIRIVA HANDIHALER) 18 MCG inhalation capsule Place 18 mcg into inhaler and inhale daily.    [provider]    Allergies  Allergen Reactions  . Amoxicillin-Pot Clavulanate Hives    Also vomiting  . Septra [Sulfamethoxazole-Trimethoprim] Hives and Nausea And Vomiting    Family History  Problem Relation Age of Onset  . Hypertension Father     Social History Social History  Substance Use Topics  . Smoking status: Former Smoker    Packs/day: 0.50    Years: 30.00    Quit date: 06/17/2015  . Smokeless tobacco: Never Used  . Alcohol use No    Review of Systems Constitutional: Subjective fever yesterday. Positive for generalized weakness. ENT: Negative for congestion Cardiovascular: Negative for chest pain. Respiratory: Positive for shortness of breath and cough. Gastrointestinal: Negative for abdominal pain, vomiting and diarrhea. Genitourinary: Negative for dysuria. Musculoskeletal: Negative for back pain. Skin: Negative for rash. Neurological: Negative for headache All other ROS negative  ____________________________________________   PHYSICAL EXAM:  VITAL SIGNS: ED Triage Vitals  Enc Vitals Group     BP 11/16/16 1230 (!) 88/51     Pulse Rate 11/16/16 1226 (!) 119     Resp 11/16/16 1226 (!) 24     Temp 11/16/16 1226 98.1 F (36.7 C)     Temp Source 11/16/16 1226 Oral     SpO2 11/16/16 1227 (!) 88 %     Weight 11/16/16 1226 170 lb (77.1 kg)     Height 11/16/16 1226 5\' 8"  (1.727 m)     Head Circumference --      Peak Flow --      Pain Score 11/16/16 1226 7     Pain Loc --      Pain Edu? --      Excl. in [place]? --     Constitutional: Alert and oriented. Well appearing and in no  distress. Eyes: Normal exam ENT   Head: Normocephalic and atraumatic.   Mouth/Throat: Mucous membranes are moist. Cardiovascular: Normal rate, regular rhythm. No murmur Respiratory: Mild tachypnea with mild expiratory wheeze bilaterally and overall decreased air movement. No obvious rales or rhonchi. Gastrointestinal: Soft and nontender. No distention.  Musculoskeletal: Nontender with normal range of motion in all extremities. Neurologic:  Normal speech and language. No gross focal neurologic deficits  Skin:  Skin is warm, dry and intact.  Psychiatric: Mood and affect are normal.   ____________________________________________    EKG  EKG reviewed and interpreted by myself shows sinus tachycardia 119 bpm, narrow QRS, normal axis, normal intervals, nonspecific ST changes without elevation.  ____________________________________________    RADIOLOGY  1. No evidence of active disease. 2. 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  ____________________________________________   INITIAL IMPRESSION / ASSESSMENT AND PLAN / ED  COURSE  Pertinent labs & imaging results that were available during my care of the patient were reviewed by me and considered in my medical decision making (see chart for details).  Patient presents the emergency department with 5 days of cough, congestion, shortness of breath. On exam patient is mild expiratory wheeze, is tachycardic, 120, hypotensive and hypoxic. Code sepsis protocols have been initiated. We will start the patient on empiric antibiotics. She denies any hospitalizations over the past 3 months. We will cover with antibiotics to cover community-acquired pneumonia while awaiting further workup. Patient agreeable to plan.  Overall the patient's workup as shown largely normal results. Including a normal white blood cell count and normal lactic acid. Patient's chest x-ray is negative besides radiation-induced fibrosis. Patient continues to have a frequent  cough in the emergency department. Hypoxic on room air. Currently on 3 L of oxygen with no home O2 requirement at baseline. Satting 91% on 3 L. We'll continue with breathing treatments. I've dose Solu-Medrol. We'll admit to the hospital for likely COPD exacerbation.  ____________________________________________   FINAL CLINICAL IMPRESSION(S) / ED DIAGNOSES  COPD exacerbation    Harvest Dark, MD 11/16/16 1353

## 2016-11-16 NOTE — ED Notes (Signed)
Pt urinated through diaper. Sheets and bedding changes. NAD.

## 2016-11-16 NOTE — ED Notes (Signed)
Admitting Provider at bedside. 

## 2016-11-17 LAB — BASIC METABOLIC PANEL WITH GFR
Anion gap: 7 (ref 5–15)
BUN: 8 mg/dL (ref 6–20)
CO2: 27 mmol/L (ref 22–32)
Calcium: 8.2 mg/dL — ABNORMAL LOW (ref 8.9–10.3)
Chloride: 105 mmol/L (ref 101–111)
Creatinine, Ser: 0.54 mg/dL (ref 0.44–1.00)
GFR calc Af Amer: >60 mL/min
GFR calc non Af Amer: >60 mL/min
Glucose, Bld: 182 mg/dL — ABNORMAL HIGH (ref 65–99)
Potassium: 3.6 mmol/L (ref 3.5–5.1)
Sodium: 139 mmol/L (ref 135–145)

## 2016-11-17 LAB — CBC
HCT: 33.2 % — ABNORMAL LOW (ref 35.0–47.0)
Hemoglobin: 11.5 g/dL — ABNORMAL LOW (ref 12.0–16.0)
MCH: 33 pg (ref 26.0–34.0)
MCHC: 34.8 g/dL (ref 32.0–36.0)
MCV: 94.8 fL (ref 80.0–100.0)
PLATELETS: 136 10*3/uL — AB (ref 150–440)
RBC: 3.5 MIL/uL — ABNORMAL LOW (ref 3.80–5.20)
RDW: 14.4 % (ref 11.5–14.5)
WBC: 9.7 10*3/uL (ref 3.6–11.0)

## 2016-11-17 LAB — URINE CULTURE

## 2016-11-17 LAB — EXPECTORATED SPUTUM ASSESSMENT W GRAM STAIN, RFLX TO RESP C: Special Requests: NORMAL

## 2016-11-17 MED ORDER — MUPIROCIN 2 % EX OINT
1.0000 "application " | TOPICAL_OINTMENT | Freq: Two times a day (BID) | CUTANEOUS | Status: DC
  Administered 2016-11-17 – 2016-11-21 (×8): 1 via NASAL
  Filled 2016-11-17: qty 22

## 2016-11-17 MED ORDER — CHLORHEXIDINE GLUCONATE CLOTH 2 % EX PADS
6.0000 | MEDICATED_PAD | Freq: Every day | CUTANEOUS | Status: DC
  Administered 2016-11-18 – 2016-11-22 (×4): 6 via TOPICAL

## 2016-11-17 MED ORDER — METHYLPREDNISOLONE SODIUM SUCC 125 MG IJ SOLR
60.0000 mg | Freq: Two times a day (BID) | INTRAMUSCULAR | Status: DC
  Administered 2016-11-17 – 2016-11-20 (×6): 60 mg via INTRAVENOUS
  Filled 2016-11-17 (×6): qty 2

## 2016-11-17 MED ORDER — LEVOFLOXACIN 500 MG PO TABS
750.0000 mg | ORAL_TABLET | Freq: Every day | ORAL | Status: DC
  Administered 2016-11-17 – 2016-11-18 (×2): 750 mg via ORAL
  Filled 2016-11-17 (×2): qty 2

## 2016-11-17 NOTE — Progress Notes (Signed)
[place] [facility] [place] NAME: Charlene [name]    MR#:  [id number]  DATE OF BIRTH:  Mar 20, 1961  SUBJECTIVE:  CHIEF COMPLAINT:   Chief Complaint  Patient presents with  . Shortness of Breath   Continues to have shortness of breath and cough. Clear sputum. On 4-5 L oxygen.  REVIEW OF SYSTEMS:    Review of Systems  Constitutional: Positive for malaise/fatigue. Negative for chills and fever.  HENT: Negative for sore throat.   Eyes: Negative for blurred vision, double vision and pain.  Respiratory: Positive for cough, sputum production, shortness of breath and wheezing. Negative for hemoptysis.   Cardiovascular: Negative for chest pain, palpitations, orthopnea and leg swelling.  Gastrointestinal: Negative for abdominal pain, constipation, diarrhea, heartburn, nausea and vomiting.  Genitourinary: Negative for dysuria and hematuria.  Musculoskeletal: Negative for back pain and joint pain.  Skin: Negative for rash.  Neurological: Positive for weakness. Negative for sensory change, speech change, focal weakness and headaches.  Endo/Heme/Allergies: Does not bruise/bleed easily.  Psychiatric/Behavioral: Negative for depression. The patient is not nervous/anxious.     DRUG ALLERGIES:   Allergies  Allergen Reactions  . Amoxicillin-Pot Clavulanate Hives    Also vomiting  . Septra [Sulfamethoxazole-Trimethoprim] Hives and Nausea And Vomiting    VITALS:  Blood pressure (!) 98/59, pulse 86, temperature 97.6 F (36.4 C), temperature source Axillary, resp. rate 18, height 5\' 8"  (1.727 m), weight 84.7 kg (186 lb 11.2 oz), SpO2 90 %.  PHYSICAL EXAMINATION:   Physical Exam  GENERAL:  56 y.o.-year-old patient lying in the bed with no acute distress.  EYES: Pupils equal, round, reactive to light and accommodation. No scleral icterus. Extraocular muscles intact.  HEENT: Head atraumatic, normocephalic. Oropharynx and nasopharynx clear.  NECK:  Supple, no  jugular venous distention. No thyroid enlargement, no tenderness.  LUNGS: Increased work of breathing. Conversational dyspnea. Bilateral wheezing. CARDIOVASCULAR: S1, S2 normal. No murmurs, rubs, or gallops.  ABDOMEN: Soft, nontender, nondistended. Bowel sounds present. No organomegaly or mass.  EXTREMITIES: No cyanosis, clubbing or edema b/l.    NEUROLOGIC: Cranial nerves II through XII are intact. No focal Motor or sensory deficits b/l.   PSYCHIATRIC: The patient is alert and oriented x 3.  SKIN: No obvious rash, lesion, or ulcer.   LABORATORY PANEL:   CBC  Recent Labs Lab 11/17/16 0350  WBC 9.7  HGB 11.5*  HCT 33.2*  PLT 136*   ------------------------------------------------------------------------------------------------------------------ Chemistries   Recent Labs Lab 11/16/16 1240 11/17/16 0350  NA 132* 139  K 3.7 3.6  CL 97* 105  CO2 26 27  GLUCOSE 105* 182*  BUN 9 8  CREATININE 0.63 0.54  CALCIUM 8.3* 8.2*  AST 32  --   ALT 12*  --   ALKPHOS 57  --   BILITOT 0.7  --    ------------------------------------------------------------------------------------------------------------------  Cardiac Enzymes  Recent Labs Lab 11/16/16 1240  TROPONINI <0.03   ------------------------------------------------------------------------------------------------------------------  RADIOLOGY:  Dg Chest 2 View  Result Date: 11/16/2016 CLINICAL DATA:  Shortness of breath EXAM: CHEST  2 VIEW COMPARISON:  Chest CT 08/19/2016 FINDINGS: Right upper lobe opacity correlating with radiation changes on prior CT. No acute airspace disease. No edema, effusion, or pneumothorax. Stable right hilar distortion. Porta catheter on the right with tip at the upper cavoatrial junction. IMPRESSION: 1. No evidence of active disease. 2. 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 Electronically Signed   By: Monte Fantasia M.D.   On: 11/16/2016 13:25     ASSESSMENT  AND PLAN:   Charlene [name]  is a 56 y.o.  female with a known history of COPD not on home oxygen, Bipolar disorder, borderline schizophrenia, hypertension, history of right lung cancer currently in remission presents to hospital secondary to worsening shortness of breath.  # Acute respiratory failure with hypoxia-secondary to COPD exacerbation with acute bronchitis. -IV steroids, Antibiotics - Scheduled Nebulizers - Inhalers -Wean O2 as tolerated - Consult pulmonary if no improvement  # bipolar disorder-stable  On Depakote and risperidone  # chronic sinus tachycardia continue Cardizem. If heart rate remains elevated, change DuoNeb's to Xopenex  # history of lung cancer-diagnosed at [facility] in January 2017, last chemotherapy in April 2017. Follows up at the cancer center. Most recent CT chest in March 2018 with no new findings. Continue outpatient follow-up.  # DVT prophylaxis-Lovenox  All the records are reviewed and case discussed with Care Management/Social Workerr. Management plans discussed with the patient, family and they are in agreement.  CODE STATUS: FULL CODE  DVT Prophylaxis: SCDs  TOTAL TIME TAKING CARE OF THIS PATIENT: 30 minutes.   POSSIBLE D/C IN 2-3 DAYS, DEPENDING ON CLINICAL CONDITION.  [name] R M.D on 11/17/2016 at 1:01 PM  Between 7am to 6pm - Pager - [phone]  After 6pm go to [email or web address] - password [email or web address] [facility]  Office  [phone]  CC: Primary care physician; Sison, Adele Dan, MD  Note: This dictation was prepared with Dragon dictation along with smaller phrase technology. Any transcriptional errors that result from this process are unintentional.

## 2016-11-17 NOTE — Progress Notes (Signed)
Patient is A&Ox4. Continues with 4.5L of O2 and productive cough. Soft BP this shift. Holding cardizem for this day per conversation with MD. Charlene [name] at this time OOB to [facility]. Sputum pending recollection.

## 2016-11-17 NOTE — Progress Notes (Signed)
Pharmacy Antibiotic Note  Charlene [name] is a 56 y.o. female admitted on 11/16/2016 with pneumonia.  Pharmacy has been consulted for levofloxacin dosing. Patient also received vancomycin 1000 mg IV x 1 in ED  Plan: Levofloxacin 750mg  changed from IV to PO per protocol. WBC is WNL, patient is afebrile, and taking other PO medications.   Height: 5\' 8"  (172.7 cm) Weight: 186 lb 11.2 oz (84.7 kg) IBW/kg (Calculated) : 63.9  Temp (24hrs), Avg:98 F (36.7 C), Min:97.6 F (36.4 C), Max:98.4 F (36.9 C)   Recent Labs Lab 11/16/16 1240 11/17/16 0350  WBC 8.4 9.7  CREATININE 0.63 0.54  LATICACIDVEN 0.7  --     Estimated Creatinine Clearance: 90.6 mL/min (by C-G formula based on SCr of 0.54 mg/dL).    Allergies  Allergen Reactions  . Amoxicillin-Pot Clavulanate Hives    Also vomiting  . Septra [Sulfamethoxazole-Trimethoprim] Hives and Nausea And Vomiting    Antimicrobials this admission: levofloxacin 6/3 >>  vanc 6/3 x 1  Dose adjustments this admission:  Microbiology results: 6/3 BCx: sent 6/3 UCx: sent   Sputum:    MRSA PCR:   Thank you for allowing pharmacy to be a part of this patient's care.  Pernell Dupre, PharmD, BCPS Clinical Pharmacist 11/17/2016 8:54 AM

## 2016-11-17 NOTE — Progress Notes (Signed)
Pnt had no issues or concerns overnight or at this time in the shift. Pnt continues to have oxygen requirement at 4.5L Charlene [name]. O2 sats are >90%. Pnt up to bathroom with 1 person assist and slightly unsteady. No complaints of pain or s/sx of resp distress. Pnt bed low and locked with bed alarm on. Call bell in reach. Will continue to monitor and assess.

## 2016-11-18 LAB — HIV ANTIBODY (ROUTINE TESTING W REFLEX): HIV SCREEN 4TH GENERATION: NONREACTIVE

## 2016-11-18 MED ORDER — IBUPROFEN 400 MG PO TABS
400.0000 mg | ORAL_TABLET | Freq: Four times a day (QID) | ORAL | Status: DC | PRN
  Administered 2016-11-18 – 2016-11-21 (×3): 400 mg via ORAL
  Filled 2016-11-18 (×3): qty 1

## 2016-11-18 MED ORDER — LEVOFLOXACIN 750 MG PO TABS
750.0000 mg | ORAL_TABLET | Freq: Every morning | ORAL | Status: DC
  Administered 2016-11-19 – 2016-11-20 (×2): 750 mg via ORAL
  Filled 2016-11-18 (×2): qty 1

## 2016-11-18 NOTE — Progress Notes (Signed)
[facility] [place] NAME: Charlene [name]    MR#:  [id number]  DATE OF BIRTH:  1961/03/14  SUBJECTIVE:  CHIEF COMPLAINT:   Chief Complaint  Patient presents with  . Shortness of Breath   Still has SOB and cough. Clear sputum.  On 5 L O2  REVIEW OF SYSTEMS:    Review of Systems  Constitutional: Positive for malaise/fatigue. Negative for chills and fever.  HENT: Negative for sore throat.   Eyes: Negative for blurred vision, double vision and pain.  Respiratory: Positive for cough, sputum production, shortness of breath and wheezing. Negative for hemoptysis.   Cardiovascular: Negative for chest pain, palpitations, orthopnea and leg swelling.  Gastrointestinal: Negative for abdominal pain, constipation, diarrhea, heartburn, nausea and vomiting.  Genitourinary: Negative for dysuria and hematuria.  Musculoskeletal: Negative for back pain and joint pain.  Skin: Negative for rash.  Neurological: Positive for weakness. Negative for sensory change, speech change, focal weakness and headaches.  Endo/Heme/Allergies: Does not bruise/bleed easily.  Psychiatric/Behavioral: Negative for depression. The patient is not nervous/anxious.     DRUG ALLERGIES:   Allergies  Allergen Reactions  . Amoxicillin-Pot Clavulanate Hives    Also vomiting  . Septra [Sulfamethoxazole-Trimethoprim] Hives and Nausea And Vomiting    VITALS:  Blood pressure (!) 132/97, pulse (!) 103, temperature 97.6 F (36.4 C), temperature source Oral, resp. rate 16, height 5\' 8"  (1.727 m), weight 84.7 kg (186 lb 11.2 oz), SpO2 92 %.  PHYSICAL EXAMINATION:   Physical Exam  GENERAL:  56 y.o.-year-old patient lying in the bed with no acute distress.  EYES: Pupils equal, round, reactive to light and accommodation. No scleral icterus. Extraocular muscles intact.  HEENT: Head atraumatic, normocephalic. Oropharynx and nasopharynx clear.  NECK:  Supple, no jugular venous  distention. No thyroid enlargement, no tenderness.  LUNGS: Increased work of breathing. Conversational dyspnea. Bilateral wheezing. CARDIOVASCULAR: S1, S2 normal. No murmurs, rubs, or gallops.  ABDOMEN: Soft, nontender, nondistended. Bowel sounds present. No organomegaly or mass.  EXTREMITIES: No cyanosis, clubbing or edema b/l.    NEUROLOGIC: Cranial nerves II through XII are intact. No focal Motor or sensory deficits b/l. PSYCHIATRIC: The patient is alert and oriented x 3.  SKIN: No obvious rash, lesion, or ulcer.   LABORATORY PANEL:   CBC  Recent Labs Lab 11/17/16 0350  WBC 9.7  HGB 11.5*  HCT 33.2*  PLT 136*   ------------------------------------------------------------------------------------------------------------------ Chemistries   Recent Labs Lab 11/16/16 1240 11/17/16 0350  NA 132* 139  K 3.7 3.6  CL 97* 105  CO2 26 27  GLUCOSE 105* 182*  BUN 9 8  CREATININE 0.63 0.54  CALCIUM 8.3* 8.2*  AST 32  --   ALT 12*  --   ALKPHOS 57  --   BILITOT 0.7  --    ------------------------------------------------------------------------------------------------------------------  Cardiac Enzymes  Recent Labs Lab 11/16/16 1240  TROPONINI <0.03   ------------------------------------------------------------------------------------------------------------------  RADIOLOGY:  No results found.   ASSESSMENT AND PLAN:   Charlene [name]  is a 56 y.o. female with a known history of COPD not on home oxygen, Bipolar disorder, borderline schizophrenia, hypertension, history of right lung cancer currently in remission presents to hospital secondary to worsening shortness of breath.  # Acute respiratory failure with hypoxia-secondary to COPD exacerbation with acute bronchitis. He still has significant wheezing and shortness of breath. Will need continued inpatient treatment and weaning off oxygen. -IV steroids, Antibiotics - Scheduled Nebulizers - Inhalers -Wean O2 as  tolerated - Consult  pulmonary if no improvement by tomorrow  # bipolar disorder-stable  On Depakote and risperidone  # chronic sinus tachycardia continue Cardizem. If heart rate remains elevated, change DuoNeb's to Xopenex  # history of lung cancer-diagnosed at [facility] in January 2017, last chemotherapy in April 2017. Follows up at the cancer center. Most recent CT chest in March 2018 with no new findings. Continue outpatient follow-up.  # DVT prophylaxis-Lovenox  All the records are reviewed and case discussed with Care Management/Social Workerr. Management plans discussed with the patient, family and they are in agreement.  CODE STATUS: FULL CODE  DVT Prophylaxis: SCDs  TOTAL TIME TAKING CARE OF THIS PATIENT: 30 minutes.   POSSIBLE D/C IN 2-3 DAYS, DEPENDING ON CLINICAL CONDITION.  [name] R M.D on 11/18/2016 at 4:00 PM  Between 7am to 6pm - Pager - [phone]  After 6pm go to [email or web address] - password [email or web address] [facility]  Office  [phone]  CC: Primary care physician; Sison, Adele Dan, MD  Note: This dictation was prepared with Dragon dictation along with smaller phrase technology. Any transcriptional errors that result from this process are unintentional.

## 2016-11-18 NOTE — Plan of Care (Signed)
Problem: Safety: Goal: Ability to remain free from injury will improve Outcome: Progressing Patient remained free from injury this shift; no new injuries reported. Bed locked in low position, call bell within reach, BSC at bedside. Bed alarm on.   Problem: Pain Managment: Goal: General experience of comfort will improve Outcome: Progressing Patient complained of headache- given Ibuprofen PRN. Medicated for cough per order.  Problem: Physical Regulation: Goal: Ability to maintain clinical measurements within normal limits will improve Outcome: Not Met (add Reason) Patient still requiring supplemental Oxygen. Unable to wean at this time. Per MD, patient requiring more time to allows medications to take affect. Will attempt to wean as tolerated. Goal: Will remain free from infection Outcome: Progressing No new signs or symptoms of infection noted  Problem: Skin Integrity: Goal: Risk for impaired skin integrity will decrease Outcome: Progressing No new skin breakdown noted. Patient able to turn and reposition self. Recevied bath this AM and Lovenox for VTE prophylaxis  Problem: Tissue Perfusion: Goal: Risk factors for ineffective tissue perfusion will decrease Outcome: Not Met (add Reason) Patient still requiring scheduled nebulizers and expiratory wheezes present in lung fields  Problem: Activity: Goal: Risk for activity intolerance will decrease Outcome: Not Met (add Reason) Patient still having dyspnea with exertion

## 2016-11-19 ENCOUNTER — Inpatient Hospital Stay: Payer: Medicaid Other

## 2016-11-19 ENCOUNTER — Encounter: Payer: Self-pay | Admitting: Internal Medicine

## 2016-11-19 MED ORDER — TIOTROPIUM BROMIDE MONOHYDRATE 18 MCG IN CAPS
18.0000 ug | ORAL_CAPSULE | Freq: Every day | RESPIRATORY_TRACT | Status: DC
  Administered 2016-11-19 – 2016-11-20 (×2): 18 ug via RESPIRATORY_TRACT
  Filled 2016-11-19: qty 5

## 2016-11-19 MED ORDER — MOMETASONE FURO-FORMOTEROL FUM 100-5 MCG/ACT IN AERO
2.0000 | INHALATION_SPRAY | Freq: Two times a day (BID) | RESPIRATORY_TRACT | Status: DC
  Administered 2016-11-19 – 2016-11-20 (×4): 2 via RESPIRATORY_TRACT
  Filled 2016-11-19: qty 8.8

## 2016-11-19 MED ORDER — IOPAMIDOL (ISOVUE-370) INJECTION 76%
75.0000 mL | Freq: Once | INTRAVENOUS | Status: AC | PRN
  Administered 2016-11-19: 75 mL via INTRAVENOUS

## 2016-11-19 NOTE — Consult Note (Signed)
Pulmonary Critical Care  Initial Consult Note   Charlene [name] YKD:[id number] DOB: September 19, 1960 DOA: 11/16/2016  Referring physician: Waldemar Dickens, MD PCP: Jiles Garter, MD   Chief Complaint: SOB  HPI: Charlene [name] is a 56 y.o. female with history of COPD on oxygen presented to the hospital with increasting SOB. Patient also has a history of lung cancer in remission. She has a cough and congestion no hemoptysis noted. No chest pain noted. Denies having any fevers or chills. Patient has no edema. The daughter apparently found her unresponsive and blue. She was breathing at the time. Patient had also had a decline in her status with weakness. Patient was noted in the ED to be hypoxic so was admitted for further management.   Review of Systems:  12 point ROS performed and is unremarkable other than noted in HPI  Past Medical History:  Diagnosis Date  . Asthma   . Bipolar 2 disorder ([place])   . Borderline schizophrenia   . Falls infrequently   . Hypertension   . Primary cancer of right lower lobe of lung ([place]) 07/12/2015   Small cell undifferentiated carcinoma of lung.  Diagnosis at [facility] by fine-needle aspiration of lymph node (January, 2017)  . Ulcer    Past Surgical History:  Procedure Laterality Date  . ABDOMINAL HYSTERECTOMY    . APPENDECTOMY    . borderline schizophrenia    . chronic back pain    . NASAL SINUS SURGERY     x2  . PERIPHERAL VASCULAR CATHETERIZATION N/A 07/30/2015   Procedure: Glori Luis Cath Insertion;  Surgeon: Algernon Huxley, MD;  Location: [facility];  Service: Cardiovascular;  Laterality: N/A;  . STOMACH SURGERY     Social History:  reports that she quit smoking about 17 months ago. She has a 15.00 pack-year smoking history. She has never used smokeless tobacco. She reports that she uses drugs, including Cocaine. She reports that she does not drink alcohol.  Allergies  Allergen Reactions  . Amoxicillin-Pot Clavulanate Hives    Also vomiting   . Septra [Sulfamethoxazole-Trimethoprim] Hives and Nausea And Vomiting    Family History  Problem Relation Age of Onset  . Hypertension Father     Prior to Admission medications   Medication Sig Start Date End Date Taking? Authorizing Provider  acetaminophen (TYLENOL) 500 MG tablet Take 500 mg by mouth every 6 (six) hours as needed.   Yes [provider]  albuterol (PROVENTIL HFA;VENTOLIN HFA) 108 (90 Base) MCG/ACT inhaler Inhale 2 puffs into the lungs every 4 (four) hours as needed for wheezing or shortness of breath.   Yes [provider]  cyclobenzaprine (FLEXERIL) 10 MG tablet Take 10 mg by mouth 2 (two) times daily.  06/26/15  Yes [provider]  diltiazem (CARDIZEM CD) 120 MG 24 hr capsule Take 1 capsule (120 mg total) by mouth daily. 01/11/16  Yes Max Sane, MD  divalproex (DEPAKOTE) 500 MG DR tablet Take 2 tablets (1,000 mg total) by mouth every 12 (twelve) hours. 01/11/16  Yes Max Sane, MD  fluticasone (FLONASE) 50 MCG/ACT nasal spray 1 spray by Each Nare route daily. 07/03/15 08/23/66 Yes [provider]  guaiFENesin-codeine (ROBITUSSIN AC) 100-10 MG/5ML syrup Take 5 mLs by mouth every 12 (twelve) hours as needed for cough. 08/13/16  Yes Cammie Sickle, MD  loratadine (CLARITIN) 10 MG tablet Take 10 mg by mouth daily as needed.    Yes [provider]  Multiple Vitamin (MULTIVITAMIN) tablet  Take 1 tablet by mouth daily.   Yes [provider]  omeprazole (PRILOSEC) 20 MG capsule Take 20 mg by mouth daily.   Yes [provider]  risperiDONE (RISPERDAL) 1 MG tablet Take 1 tablet (1 mg total) by mouth 2 (two) times daily. 01/11/16  Yes Max Sane, MD  Saline (AYR SALINE NASAL DROPS) 0.65 % (Soln) SOLN 2 drops by Each Nare route every four (4) hours as needed. 07/03/15  Yes [provider]  tiotropium (SPIRIVA HANDIHALER) 18 MCG inhalation capsule Place 18 mcg into inhaler and inhale daily.   Yes [provider]  vitamin C (ASCORBIC ACID) 500 MG tablet Take 500 mg by mouth daily.   Yes [provider]  sucralfate (CARAFATE) 1 g tablet Take 1 tablet (1 g total) by mouth 4 (four) times daily. Dissolve in 2-3 tbsp warm water, swish and swallow. Patient not taking: Reported on 11/16/2016 11/01/15   Forest Gleason, MD   Physical Exam: Vitals:   11/18/16 2300 11/19/16 0025 11/19/16 0336 11/19/16 0802  BP: 131/88   113/72  Pulse: 98   91  Resp: 18     Temp: 97.7 F (36.5 C)   97.7 F (36.5 C)  TempSrc:    Oral  SpO2: 92% 90% (!) 89% 92%  Weight:      Height:        Wt Readings from Last 3 Encounters:  11/16/16 84.7 kg (186 lb 11.2 oz)  08/22/16 82.6 kg (182 lb)  06/05/16 81 kg (178 lb 7.4 oz)    General:  Appears calm and comfortable Eyes: PERRL, normal lids, irises & conjunctiva ENT: grossly normal hearing, lips & tongue Neck: no LAD, masses or thyromegaly Cardiovascular: RRR, no m/r/g. No LE edema. Respiratory: +rhonchi Normal respiratory effort. Abdomen: soft, nontender Skin: no rash or induration seen on limited exam Musculoskeletal: grossly normal tone BUE/BLE Psychiatric: grossly normal mood and affect Neurologic: grossly non-focal.          Labs on Admission:  Basic Metabolic Panel:  Recent Labs Lab 11/16/16 1240 11/17/16 0350  NA 132* 139  K 3.7 3.6  CL 97* 105  CO2 26 27  GLUCOSE 105* 182*  BUN 9 8  CREATININE 0.63 0.54  CALCIUM 8.3* 8.2*   Liver Function Tests:  Recent Labs Lab 11/16/16 1240  AST 32  ALT 12*  ALKPHOS 57  BILITOT 0.7  PROT 6.6  ALBUMIN 2.9*   No results for input(s): LIPASE, AMYLASE in the last 168 hours. No results for input(s): AMMONIA in the last 168 hours. CBC:  Recent Labs Lab 11/16/16 1240 11/17/16 0350  WBC 8.4 9.7  NEUTROABS 6.1  --   HGB 12.2 11.5*  HCT 35.3 33.2*  MCV 94.7 94.8  PLT 138* 136*   Cardiac Enzymes:  Recent Labs Lab 11/16/16 1240  TROPONINI <0.03    BNP (last 3 results) No  results for input(s): BNP in the last 8760 hours.  ProBNP (last 3 results) No results for input(s): PROBNP in the last 8760 hours.  CBG: No results for input(s): GLUCAP in the last 168 hours.  Radiological Exams on Admission: No results found.  EKG: Independently reviewed.  Assessment/Plan Active Problems:   COPD exacerbation ([place])   1. Acute respiratory failure w hypoxia -she is reportedly on home O2 should be continued -no ABG done will get one now -would try to maintain saO2 >88% -continue with management of COPD  2. COPD -would continue with steroids -continue with bronchodilators -CXR  showed no acute pneumonia  3. H/o Lung Cancer -has chronic changes related to her prior radiation    Code Status: DNR  Family Communication: none Disposition Plan: home  Time spent: 57min    I have personally obtained a history, examined the patient, evaluated laboratory and imaging results, formulated the assessment and plan and placed orders.  The Patient requires high complexity decision making for assessment and support.    Allyne Gee, MD [facility] Pulmonary Critical Care Medicine Sleep Medicine

## 2016-11-19 NOTE — Progress Notes (Signed)
Advance care planning  Discussed with patient regarding her COPD and history of lung cancer. She understands that continued smoking has made things worse. She also tells me her mother is in the hospital admitted at this time for recurrent pneumonia. She has never been on oxygen at home. Wants to get stronger and fully recovered before leaving the hospital.  Discussed regarding code status and she does not want intubation or CPR. Discussed regarding DO NOT RESUSCITATE status and patient agrees. Orders entered.  Time spent 20 minutes

## 2016-11-19 NOTE — Progress Notes (Signed)
[facility] at [place] NAME: Charlene [name]    MR#:  [id number]  DATE OF BIRTH:  June 24, 1960  SUBJECTIVE:  CHIEF COMPLAINT:   Chief Complaint  Patient presents with  . Shortness of Breath   Still has SOB and cough. Clear sputum. Some mild pleuritic chest pain On 5 L O2  REVIEW OF SYSTEMS:    Review of Systems  Constitutional: Positive for malaise/fatigue. Negative for chills and fever.  HENT: Negative for sore throat.   Eyes: Negative for blurred vision, double vision and pain.  Respiratory: Positive for cough, sputum production, shortness of breath and wheezing. Negative for hemoptysis.   Cardiovascular: Negative for chest pain, palpitations, orthopnea and leg swelling.  Gastrointestinal: Negative for abdominal pain, constipation, diarrhea, heartburn, nausea and vomiting.  Genitourinary: Negative for dysuria and hematuria.  Musculoskeletal: Negative for back pain and joint pain.  Skin: Negative for rash.  Neurological: Positive for weakness. Negative for sensory change, speech change, focal weakness and headaches.  Endo/Heme/Allergies: Does not bruise/bleed easily.  Psychiatric/Behavioral: Negative for depression. The patient is not nervous/anxious.     DRUG ALLERGIES:   Allergies  Allergen Reactions  . Amoxicillin-Pot Clavulanate Hives    Also vomiting  . Septra [Sulfamethoxazole-Trimethoprim] Hives and Nausea And Vomiting    VITALS:  Blood pressure 113/72, pulse 91, temperature 97.7 F (36.5 C), temperature source Oral, resp. rate 18, height 5\' 8"  (1.727 m), weight 84.7 kg (186 lb 11.2 oz), SpO2 92 %.  PHYSICAL EXAMINATION:   Physical Exam  GENERAL:  56 y.o.-year-old patient lying in the bed with no acute distress.  EYES: Pupils equal, round, reactive to light and accommodation. No scleral icterus. Extraocular muscles intact.  HEENT: Head atraumatic, normocephalic. Oropharynx and nasopharynx clear.  NECK:  Supple, no  jugular venous distention. No thyroid enlargement, no tenderness.  LUNGS: Increased work of breathing. Conversational dyspnea. Bilateral wheezing. CARDIOVASCULAR: S1, S2 normal. No murmurs, rubs, or gallops.  ABDOMEN: Soft, nontender, nondistended. Bowel sounds present. No organomegaly or mass.  EXTREMITIES: No cyanosis, clubbing or edema b/l.    NEUROLOGIC: Cranial nerves II through XII are intact. No focal Motor or sensory deficits b/l. PSYCHIATRIC: The patient is alert and oriented x 3.  SKIN: No obvious rash, lesion, or ulcer.   LABORATORY PANEL:   CBC  Recent Labs Lab 11/17/16 0350  WBC 9.7  HGB 11.5*  HCT 33.2*  PLT 136*   ------------------------------------------------------------------------------------------------------------------ Chemistries   Recent Labs Lab 11/16/16 1240 11/17/16 0350  NA 132* 139  K 3.7 3.6  CL 97* 105  CO2 26 27  GLUCOSE 105* 182*  BUN 9 8  CREATININE 0.63 0.54  CALCIUM 8.3* 8.2*  AST 32  --   ALT 12*  --   ALKPHOS 57  --   BILITOT 0.7  --    ------------------------------------------------------------------------------------------------------------------  Cardiac Enzymes  Recent Labs Lab 11/16/16 1240  TROPONINI <0.03   ------------------------------------------------------------------------------------------------------------------  RADIOLOGY:  Ct Angio Chest Pe W Or Wo Contrast  Result Date: 11/19/2016 CLINICAL DATA:  Increasing shortness of breath and patient with a history of COPD and lung carcinoma. EXAM: CT ANGIOGRAPHY CHEST WITH CONTRAST TECHNIQUE: Multidetector CT imaging of the chest was performed using the standard protocol during bolus administration of intravenous contrast. Multiplanar CT image reconstructions and MIPs were obtained to evaluate the vascular anatomy. CONTRAST:  75 cc Isovue 370. COMPARISON:  CT chest, abdomen and pelvis 08/21/2016 and 01/25/2016. PA and lateral chest 11/16/2016. FINDINGS:  Cardiovascular: No pulmonary embolus  is identified. Heart size is normal. Port-A-Cath is noted. No pericardial effusion. Mediastinum/Nodes: High right paratracheal node measuring 0.8 cm on image 12 is unchanged. No pathologically enlarged lymph nodes in the axilla, hila or mediastinum. New Lungs/Pleura: New trace left pleural effusion is identified. The patient has new airspace disease in the upper lobes bilaterally superimposed on emphysematous change. Small focus of airspace disease is also identified in the right middle lobe. There is also new small focus of airspace disease in the left lower lobe air bronchograms present. Radiation fibrosis change on the right seen on the prior examination is now obscured. Small focus of patchy atelectasis or scar right lower lobe is unchanged. Upper Abdomen: Negative. Musculoskeletal: No acute bony abnormality. Mild lower thoracic spondylosis noted. Review of the MIP images confirms the above findings. IMPRESSION: Negative for pulmonary embolus. New extensive bilateral upper lobe airspace disease, patchy airspace opacity in the right middle lobe and small focus of airspace opacity with air bronchograms the left lower lobe most compatible with multifocal pneumonia. New very small right pleural effusion. Electronically Signed   By: Inge Rise M.D.   On: 11/19/2016 12:26     ASSESSMENT AND PLAN:   Charlene [name]  is a 56 y.o. female with a known history of COPD not on home oxygen, Bipolar disorder, borderline schizophrenia, hypertension, history of right lung cancer currently in remission presents to hospital secondary to worsening shortness of breath.  # Acute respiratory failure with hypoxia-secondary to COPD exacerbation with acute bronchitis.  still has significant wheezing and shortness of breath. -IV steroids, Antibiotics - Scheduled Nebulizers - Inhalers -Wean O2 as tolerated - CTA chest. Consult pulmonary. Paged Dr. Raul Del. Waiting for call  back.  # Bipolar disorder-stable  On Depakote and risperidone  # chronic sinus tachycardia On cardizem. Held due to low normal BP  # history of lung cancer-diagnosed at [facility] in January 2017, last chemotherapy in April 2017. Follows up at the cancer center. Most recent CT chest in March 2018 with no new findings. Continue outpatient follow-up.  # DVT prophylaxis-Lovenox  All the records are reviewed and case discussed with Care Management/Social Workerr. Management plans discussed with the patient, family and they are in agreement.  CODE STATUS: FULL CODE  DVT Prophylaxis: SCDs  TOTAL TIME TAKING CARE OF THIS PATIENT: 30 minutes.   POSSIBLE D/C IN 2-3 DAYS, DEPENDING ON CLINICAL CONDITION.  [name] R M.D on 11/19/2016 at 1:03 PM  Between 7am to 6pm - Pager - [phone]  After 6pm go to [email or web address] - password [email or web address] [facility]  Office  [phone]  CC: Primary care physician; Sison, Adele Dan, MD  Note: This dictation was prepared with Dragon dictation along with smaller phrase technology. Any transcriptional errors that result from this process are unintentional.

## 2016-11-19 NOTE — Plan of Care (Signed)
Problem: Respiratory: Goal: Levels of oxygenation will improve Outcome: Not Progressing Continues on 4.5L-O2

## 2016-11-19 NOTE — Plan of Care (Signed)
Problem: Respiratory: Goal: Ability to maintain a clear airway will improve Outcome: Not Progressing Patient not improving with cough, small amounts of sputum.

## 2016-11-20 LAB — BLOOD GAS, ARTERIAL
Acid-Base Excess: 9.8 mmol/L — ABNORMAL HIGH (ref 0.0–2.0)
Bicarbonate: 34.3 mmol/L — ABNORMAL HIGH (ref 20.0–28.0)
FIO2: 0.4
O2 SAT: 93.8 %
PCO2 ART: 45 mmHg (ref 32.0–48.0)
PH ART: 7.49 — AB (ref 7.350–7.450)
PO2 ART: 64 mmHg — AB (ref 83.0–108.0)
Patient temperature: 37

## 2016-11-20 LAB — CBC WITH DIFFERENTIAL/PLATELET
Basophils Absolute: 0.1 10*3/uL (ref 0–0.1)
Basophils Relative: 0 %
Eosinophils Absolute: 0 10*3/uL (ref 0–0.7)
Eosinophils Relative: 0 %
HCT: 33.6 % — ABNORMAL LOW (ref 35.0–47.0)
Hemoglobin: 11.3 g/dL — ABNORMAL LOW (ref 12.0–16.0)
Lymphocytes Relative: 5 %
Lymphs Abs: 0.7 10*3/uL — ABNORMAL LOW (ref 1.0–3.6)
MCH: 31.8 pg (ref 26.0–34.0)
MCHC: 33.7 g/dL (ref 32.0–36.0)
MCV: 94.6 fL (ref 80.0–100.0)
Monocytes Absolute: 0.7 10*3/uL (ref 0.2–0.9)
Monocytes Relative: 5 %
Neutro Abs: 13.2 10*3/uL — ABNORMAL HIGH (ref 1.4–6.5)
Neutrophils Relative %: 90 %
Platelets: 198 10*3/uL (ref 150–440)
RBC: 3.56 MIL/uL — ABNORMAL LOW (ref 3.80–5.20)
RDW: 14.7 % — ABNORMAL HIGH (ref 11.5–14.5)
WBC: 14.6 10*3/uL — ABNORMAL HIGH (ref 3.6–11.0)

## 2016-11-20 LAB — BASIC METABOLIC PANEL
ANION GAP: 8 (ref 5–15)
BUN: 10 mg/dL (ref 6–20)
CHLORIDE: 100 mmol/L — AB (ref 101–111)
CO2: 32 mmol/L (ref 22–32)
Calcium: 8.8 mg/dL — ABNORMAL LOW (ref 8.9–10.3)
Creatinine, Ser: 0.53 mg/dL (ref 0.44–1.00)
GFR calc Af Amer: >60 mL/min (ref >60)
GFR calc non Af Amer: >60 mL/min (ref >60)
GLUCOSE: 196 mg/dL — AB (ref 65–99)
POTASSIUM: 4 mmol/L (ref 3.5–5.1)
Sodium: 140 mmol/L (ref 135–145)

## 2016-11-20 LAB — VALPROIC ACID LEVEL: Valproic Acid Lvl: 103 ug/mL — ABNORMAL HIGH (ref 50.0–100.0)

## 2016-11-20 MED ORDER — PREDNISONE 20 MG PO TABS: 40.0000 mg | ORAL_TABLET | Freq: Every day | ORAL | Status: DC

## 2016-11-20 MED ORDER — LORAZEPAM 0.5 MG PO TABS
0.5000 mg | ORAL_TABLET | Freq: Once | ORAL | Status: AC
  Administered 2016-11-20: 0.5 mg via ORAL
  Filled 2016-11-20: qty 1

## 2016-11-20 MED ORDER — IPRATROPIUM-ALBUTEROL 0.5-2.5 (3) MG/3ML IN SOLN
3.0000 mL | Freq: Four times a day (QID) | RESPIRATORY_TRACT | Status: DC
  Administered 2016-11-20 – 2016-11-23 (×11): 3 mL via RESPIRATORY_TRACT
  Filled 2016-11-20 (×11): qty 3

## 2016-11-20 MED ORDER — LORAZEPAM 0.5 MG PO TABS
0.5000 mg | ORAL_TABLET | Freq: Three times a day (TID) | ORAL | Status: DC | PRN
  Administered 2016-11-20 (×2): 0.5 mg via ORAL
  Filled 2016-11-20: qty 1

## 2016-11-20 MED ORDER — NYSTATIN 100000 UNIT/ML MT SUSP
5.0000 mL | Freq: Four times a day (QID) | OROMUCOSAL | Status: DC
  Administered 2016-11-20 (×2): 500000 [IU] via ORAL
  Filled 2016-11-20 (×5): qty 5

## 2016-11-20 MED ORDER — LORAZEPAM 1 MG PO TABS
1.0000 mg | ORAL_TABLET | Freq: Four times a day (QID) | ORAL | Status: DC | PRN
  Administered 2016-11-21: 1 mg via ORAL
  Filled 2016-11-20: qty 1

## 2016-11-20 NOTE — Progress Notes (Signed)
Dr. Darvin Neighbours paged and responded. This Probation officer reported pt's change in mentation today, anxiety and repeated attempts at ambulating in the room despite unsteady gait and staff interventions, pt at times seems confused. Pt remain on 4LNC with sat 94% reported. Per Dr. Darvin Neighbours, will get abg on pt and give low dose of ativan, check depakote level.

## 2016-11-20 NOTE — Progress Notes (Signed)
[facility] [name] NAME: Charlene [name]    MR#:  [id number]  DATE OF BIRTH:  28-Jan-1961  SUBJECTIVE:  CHIEF COMPLAINT:   Chief Complaint  Patient presents with  . Shortness of Breath   On 4 L oxygen. Still has cough. Anxious today. Periods of confusion.  REVIEW OF SYSTEMS:    Review of Systems  Constitutional: Positive for malaise/fatigue. Negative for chills and fever.  HENT: Negative for sore throat.   Eyes: Negative for blurred vision, double vision and pain.  Respiratory: Positive for cough, sputum production, shortness of breath and wheezing. Negative for hemoptysis.   Cardiovascular: Negative for chest pain, palpitations, orthopnea and leg swelling.  Gastrointestinal: Negative for abdominal pain, constipation, diarrhea, heartburn, nausea and vomiting.  Genitourinary: Negative for dysuria and hematuria.  Musculoskeletal: Negative for back pain and joint pain.  Skin: Negative for rash.  Neurological: Positive for weakness. Negative for sensory change, speech change, focal weakness and headaches.  Endo/Heme/Allergies: Does not bruise/bleed easily.  Psychiatric/Behavioral: Negative for depression. The patient is not nervous/anxious.     DRUG ALLERGIES:   Allergies  Allergen Reactions  . Amoxicillin-Pot Clavulanate Hives    Also vomiting  . Septra [Sulfamethoxazole-Trimethoprim] Hives and Nausea And Vomiting    VITALS:  Blood pressure 131/75, pulse 95, temperature 98.2 F (36.8 C), temperature source Oral, resp. rate 18, height 5\' 8"  (1.727 m), weight 84.7 kg (186 lb 11.2 oz), SpO2 99 %.  PHYSICAL EXAMINATION:   Physical Exam  GENERAL:  56 y.o.-year-old patient lying in the bed with no acute distress.  EYES: Pupils equal, round, reactive to light and accommodation. No scleral icterus. Extraocular muscles intact.  HEENT: Head atraumatic, normocephalic. Oropharynx and nasopharynx clear.  NECK:  Supple, no jugular venous  distention. No thyroid enlargement, no tenderness.  LUNGS: Increased work of breathing. Conversational dyspnea. Bilateral wheezing. CARDIOVASCULAR: S1, S2 normal. No murmurs, rubs, or gallops.  ABDOMEN: Soft, nontender, nondistended. Bowel sounds present. No organomegaly or mass.  EXTREMITIES: No cyanosis, clubbing or edema b/l.    NEUROLOGIC: Cranial nerves II through XII are intact. No focal Motor or sensory deficits b/l. PSYCHIATRIC: The patient is alert and oriented x 3. anxious SKIN: No obvious rash, lesion, or ulcer.   LABORATORY PANEL:   CBC  Recent Labs Lab 11/20/16 0406  WBC 14.6*  HGB 11.3*  HCT 33.6*  PLT 198   ------------------------------------------------------------------------------------------------------------------ Chemistries   Recent Labs Lab 11/16/16 1240  11/20/16 0406  NA 132*  < > 140  K 3.7  < > 4.0  CL 97*  < > 100*  CO2 26  < > 32  GLUCOSE 105*  < > 196*  BUN 9  < > 10  CREATININE 0.63  < > 0.53  CALCIUM 8.3*  < > 8.8*  AST 32  --   --   ALT 12*  --   --   ALKPHOS 57  --   --   BILITOT 0.7  --   --   < > = values in this interval not displayed. ------------------------------------------------------------------------------------------------------------------  Cardiac Enzymes  Recent Labs Lab 11/16/16 1240  TROPONINI <0.03   ------------------------------------------------------------------------------------------------------------------  RADIOLOGY:  Ct Angio Chest Pe W Or Wo Contrast  Result Date: 11/19/2016 CLINICAL DATA:  Increasing shortness of breath and patient with a history of COPD and lung carcinoma. EXAM: CT ANGIOGRAPHY CHEST WITH CONTRAST TECHNIQUE: Multidetector CT imaging of the chest was performed using the standard protocol during bolus administration of intravenous  contrast. Multiplanar CT image reconstructions and MIPs were obtained to evaluate the vascular anatomy. CONTRAST:  75 cc Isovue 370. COMPARISON:  CT chest,  abdomen and pelvis 08/21/2016 and 01/25/2016. PA and lateral chest 11/16/2016. FINDINGS: Cardiovascular: No pulmonary embolus is identified. Heart size is normal. Port-A-Cath is noted. No pericardial effusion. Mediastinum/Nodes: High right paratracheal node measuring 0.8 cm on image 12 is unchanged. No pathologically enlarged lymph nodes in the axilla, hila or mediastinum. New Lungs/Pleura: New trace left pleural effusion is identified. The patient has new airspace disease in the upper lobes bilaterally superimposed on emphysematous change. Small focus of airspace disease is also identified in the right middle lobe. There is also new small focus of airspace disease in the left lower lobe air bronchograms present. Radiation fibrosis change on the right seen on the prior examination is now obscured. Small focus of patchy atelectasis or scar right lower lobe is unchanged. Upper Abdomen: Negative. Musculoskeletal: No acute bony abnormality. Mild lower thoracic spondylosis noted. Review of the MIP images confirms the above findings. IMPRESSION: Negative for pulmonary embolus. New extensive bilateral upper lobe airspace disease, patchy airspace opacity in the right middle lobe and small focus of airspace opacity with air bronchograms the left lower lobe most compatible with multifocal pneumonia. New very small right pleural effusion. Electronically Signed   By: Inge Rise M.D.   On: 11/19/2016 12:26   ASSESSMENT AND PLAN:   Charlene [name]  is a 56 y.o. female with a known history of COPD not on home oxygen, Bipolar disorder, borderline schizophrenia, hypertension, history of right lung cancer currently in remission presents to hospital secondary to worsening shortness of breath.  # Acute respiratory failure with hypoxia-secondary to COPD exacerbation with acute bronchitis. Some improvement in wheezing today - steroids, Antibiotics. Change solumedrol to prednisone due to anxiety - Scheduled Nebulizers -  Inhalers -Wean O2 as tolerated - CTA chest. Consult pulmonary. Will wait for input  # Bipolar disorder with anxiety  On Depakote and risperidone Likely contributing by hospital stay and steroids. Change IV steroids to prednisone. Added Ativan as needed.  # chronic sinus tachycardia On cardizem. ON hold due to low normal BP  # history of lung cancer-diagnosed at [facility] in January 2017, last chemotherapy in April 2017. Follows up at the cancer center.  # DVT prophylaxis-Lovenox  All the records are reviewed and case discussed with Care Management/Social Workerr. Management plans discussed with the patient, family and they are in agreement.  CODE STATUS: FULL CODE  DVT Prophylaxis: SCDs  TOTAL TIME TAKING CARE OF THIS PATIENT: 30 minutes.   POSSIBLE D/C IN 2-3 DAYS, DEPENDING ON CLINICAL CONDITION.  [name] R M.D on 11/20/2016 at 12:52 PM  Between 7am to 6pm - Pager - [phone]  After 6pm go to [email or web address] - password [email or web address] [facility]  Office  [phone]  CC: Primary care physician; Sison, Adele Dan, MD  Note: This dictation was prepared with Dragon dictation along with smaller phrase technology. Any transcriptional errors that result from this process are unintentional.

## 2016-11-20 NOTE — Progress Notes (Signed)
Called [employer], new order for Air cabin crew.

## 2016-11-20 NOTE — Progress Notes (Signed)
Patient's father called.

## 2016-11-20 NOTE — Progress Notes (Signed)
Gave 0.5mg  per conversation with MD.

## 2016-11-20 NOTE — Evaluation (Signed)
Physical Therapy Evaluation Patient Details Name: Charlene [name] MRN: [id number] DOB: September 01, 1960 Today's Date: 11/20/2016   History of Present Illness  56 y.o. female with a known history of COPD not on home oxygen, Bipolar disorder, borderline schizophrenia, hypertension, history of right lung cancer currently in remission presents to hospital secondary to worsening shortness of breath.  Clinical Impression  Pt is generally weak and lethargic during session, she was on 4 liters O2 and though she did not have any overt shortness of breath she fatigued quickly with limited activity.  Pt's hands very cold and it was difficult to get an O2 saturation reading, but after brief (1 minute) bout of standing her sats were in the high 80s in sitting.  Pt with very slow, guarded, hesitant ambulation around the foot of the bed and though she did not need any direct assist to stay upright she needed constant cuing and re-direction to remain safe.  Pt hopes to be able to go home, but per today's performance she would need to do considerably better for this to be safe.     Follow Up Recommendations SNF (per progress, pt hoping to go home)    Equipment Recommendations       Recommendations for Other Services       Precautions / Restrictions Precautions Precautions: Fall Restrictions Weight Bearing Restrictions: No      Mobility  Bed Mobility Overal bed mobility: Needs Assistance Bed Mobility: Supine to Sit     Supine to sit: Min assist     General bed mobility comments: Pt able to get to EOB, but needed light assist and cuing, bed rail use   Transfers Overall transfer level: Needs assistance Equipment used: Rolling walker (2 wheeled) Transfers: Sit to/from Stand Sit to Stand: Min guard;Min assist         General transfer comment: Pt needed cunig for hand placement, to hold walker and light assist to get to fully upright  Ambulation/Gait Ambulation/Gait assistance: Min  assist Ambulation Distance (Feet): 15 Feet Assistive device: Rolling walker (2 wheeled)       General Gait Details: Pt with slow, labored ambulation and considerable guidance to remain safe and on task.  She lacked confidence and though she did not have any LOBs she generally was unsteady.  Very fatigued after this limited effort.   Stairs            Wheelchair Mobility    Modified Rankin (Stroke Patients Only)       Balance Overall balance assessment: Needs assistance Sitting-balance support: No upper extremity supported Sitting balance-Leahy Scale: Good     Standing balance support: Bilateral upper extremity supported Standing balance-Leahy Scale: Fair Standing balance comment: Pt only able to stand ~1 minute before needing to sit on first attempt.  Pt lethargic but generally unsteady during all upright tasks.                             Pertinent Vitals/Pain Pain Assessment: No/denies pain    Home Living Family/patient expects to be discharged to:: Unsure Living Arrangements: Parent               Additional Comments: Pt wants to go home, realizes she is probably not safe to do so at this time    Prior Function Level of Independence: Independent         Comments: Pt with recent falls at home, has been feeling less steady and safe.  Pt still going to church weekly and able to get out of the house regularly - does use cane at times     Hand Dominance        Extremity/Trunk Assessment   Upper Extremity Assessment Upper Extremity Assessment: Generalized weakness (unable to raise arms >110, grossly 3-/5)    Lower Extremity Assessment Lower Extremity Assessment: Generalized weakness (grossly 3+/5 t/o)       Communication   Communication:  (some confusion, difficulty enunciating words)  Cognition Arousal/Alertness: Lethargic Behavior During Therapy: Flat affect Overall Cognitive Status: Difficult to assess                                         General Comments      Exercises     Assessment/Plan    PT Assessment Patient needs continued PT services  PT Problem List Decreased strength;Decreased range of motion;Decreased activity tolerance;Decreased balance;Decreased mobility;Decreased coordination;Decreased cognition;Decreased knowledge of use of DME;Decreased safety awareness;Cardiopulmonary status limiting activity       PT Treatment Interventions DME instruction;Gait training;Stair training;Functional mobility training;Therapeutic activities;Therapeutic exercise;Balance training;Cognitive remediation;Patient/family education;Neuromuscular re-education    PT Goals (Current goals can be found in the Care Plan section)  Acute Rehab PT Goals Patient Stated Goal: go home PT Goal Formulation: With patient Time For Goal Achievement: 12/04/16 Potential to Achieve Goals: Fair    Frequency Min 2X/week   Barriers to discharge        Co-evaluation               AM-PAC PT "6 Clicks" Daily Activity  Outcome Measure Difficulty turning over in bed (including adjusting bedclothes, sheets and blankets)?: Total Difficulty moving from lying on back to sitting on the side of the bed? : Total Difficulty sitting down on and standing up from a chair with arms (e.g., wheelchair, bedside commode, etc,.)?: Total Help needed moving to and from a bed to chair (including a wheelchair)?: A Little Help needed walking in hospital room?: A Lot Help needed climbing 3-5 steps with a railing? : A Lot 6 Click Score: 10    End of Session Equipment Utilized During Treatment: Gait belt;Oxygen (4 liters, sats difficult to get - high 80s ) Activity Tolerance: Patient limited by fatigue;Patient limited by lethargy Patient left: with chair alarm set;with call bell/phone within reach;with nursing/sitter in room   PT Visit Diagnosis: Muscle weakness (generalized) (M62.81);Difficulty in walking, not elsewhere classified  (R26.2)    Time: 3710-6269 PT Time Calculation (min) (ACUTE ONLY): 17 min   Charges:   PT Evaluation $PT Eval Low Complexity: 1 Procedure     PT G Codes:        Kreg Shropshire, DPT 11/20/2016, 5:19 PM

## 2016-11-20 NOTE — Progress Notes (Signed)
Patient continues to attempt to get OOB. PT eval and treat this shift. VSS continue to be stable. Flutter valve, tolerated well. Continues on 4L [place], patient removes often. Safety sitter at bedside. Removing brief

## 2016-11-21 ENCOUNTER — Inpatient Hospital Stay: Payer: Medicaid Other

## 2016-11-21 DIAGNOSIS — R5383 Other fatigue: Secondary | ICD-10-CM

## 2016-11-21 DIAGNOSIS — J9622 Acute and chronic respiratory failure with hypercapnia: Secondary | ICD-10-CM

## 2016-11-21 LAB — GLUCOSE, CAPILLARY
GLUCOSE-CAPILLARY: 196 mg/dL — AB (ref 65–99)
GLUCOSE-CAPILLARY: 53 mg/dL — AB (ref 65–99)
GLUCOSE-CAPILLARY: 69 mg/dL (ref 65–99)
GLUCOSE-CAPILLARY: 89 mg/dL (ref 65–99)
Glucose-Capillary: 97 mg/dL (ref 65–99)
Glucose-Capillary: 117 mg/dL — ABNORMAL HIGH (ref 65–99)
Glucose-Capillary: 124 mg/dL — ABNORMAL HIGH (ref 65–99)
Glucose-Capillary: 167 mg/dL — ABNORMAL HIGH (ref 65–99)

## 2016-11-21 LAB — PROCALCITONIN: Procalcitonin: <0.1 ng/mL

## 2016-11-21 LAB — BLOOD GAS, ARTERIAL
Acid-Base Excess: 14.1 mmol/L — ABNORMAL HIGH (ref 0.0–2.0)
Bicarbonate: 39.1 mmol/L — ABNORMAL HIGH (ref 20.0–28.0)
FIO2: 36
O2 Saturation: 89.8 %
PATIENT TEMPERATURE: 37
PCO2 ART: 49 mmHg — AB (ref 32.0–48.0)
PO2 ART: 52 mmHg — AB (ref 83.0–108.0)
pH, Arterial: 7.51 — ABNORMAL HIGH (ref 7.350–7.450)

## 2016-11-21 LAB — CULTURE, BLOOD (ROUTINE X 2)
CULTURE: NO GROWTH
CULTURE: NO GROWTH
SPECIAL REQUESTS: ADEQUATE
Special Requests: ADEQUATE

## 2016-11-21 LAB — RAPID HIV SCREEN (HIV 1/2 AB+AG)
HIV 1/2 Antibodies: NONREACTIVE
HIV-1 P24 Antigen - HIV24: NONREACTIVE

## 2016-11-21 MED ORDER — DEXTROSE 50 % IV SOLN
INTRAVENOUS | Status: AC
  Administered 2016-11-21: 13:00:00
  Filled 2016-11-21: qty 50

## 2016-11-21 MED ORDER — METHYLPREDNISOLONE SODIUM SUCC 40 MG IJ SOLR
40.0000 mg | Freq: Two times a day (BID) | INTRAMUSCULAR | Status: DC
  Administered 2016-11-21 – 2016-11-23 (×4): 40 mg via INTRAVENOUS
  Filled 2016-11-21 (×4): qty 1

## 2016-11-21 MED ORDER — DIVALPROEX SODIUM 250 MG PO DR TAB: 1000.0000 mg | DELAYED_RELEASE_TABLET | Freq: Two times a day (BID) | ORAL | Status: DC

## 2016-11-21 MED ORDER — BUDESONIDE 0.25 MG/2ML IN SUSP
0.2500 mg | Freq: Two times a day (BID) | RESPIRATORY_TRACT | Status: DC
  Administered 2016-11-21: 0.25 mg via RESPIRATORY_TRACT
  Filled 2016-11-21: qty 2

## 2016-11-21 MED ORDER — RISPERIDONE 1 MG PO TABS
0.5000 mg | ORAL_TABLET | Freq: Two times a day (BID) | ORAL | Status: DC
  Administered 2016-11-21: 0.5 mg via ORAL
  Filled 2016-11-21: qty 1

## 2016-11-21 MED ORDER — DEXTROSE IN LACTATED RINGERS 5 % IV SOLN
INTRAVENOUS | Status: DC
  Administered 2016-11-21: 16:00:00 via INTRAVENOUS

## 2016-11-21 MED ORDER — DIVALPROEX SODIUM 250 MG PO DR TAB
500.0000 mg | DELAYED_RELEASE_TABLET | Freq: Two times a day (BID) | ORAL | Status: DC
  Administered 2016-11-22 (×2): 500 mg via ORAL
  Filled 2016-11-21: qty 1
  Filled 2016-11-21 (×2): qty 2

## 2016-11-21 MED ORDER — DEXTROSE 50 % IV SOLN
25.0000 mL | Freq: Once | INTRAVENOUS | Status: AC
Start: 2016-11-21 — End: 2016-11-21
  Administered 2016-11-21: 25 mL via INTRAVENOUS

## 2016-11-21 MED ORDER — BUDESONIDE 0.25 MG/2ML IN SUSP
0.2500 mg | Freq: Four times a day (QID) | RESPIRATORY_TRACT | Status: DC
  Administered 2016-11-21 – 2016-11-23 (×7): 0.25 mg via RESPIRATORY_TRACT
  Filled 2016-11-21 (×7): qty 2

## 2016-11-21 MED ORDER — LEVOFLOXACIN IN D5W 500 MG/100ML IV SOLN
500.0000 mg | INTRAVENOUS | Status: DC
  Administered 2016-11-21 – 2016-11-22 (×2): 500 mg via INTRAVENOUS
  Filled 2016-11-21 (×5): qty 100

## 2016-11-21 NOTE — NC FL2 (Signed)
[place] LEVEL OF CARE SCREENING TOOL     IDENTIFICATION  Patient Name: Charlene [name] Birthdate: 1961/01/21 Sex: female Admission Date (Current Location): 11/16/2016  [facility] and [place] Number:  [name]  ([id number]) Facility and Address:  [facility], [address], [place], [place] [address]      Provider Number: [id number]  Attending Physician Name and Address:  [name], MD  Relative Name and Phone Number:       Current Level of Care: Hospital Recommended Level of Care: [place] Prior Approval Number:    Date Approved/Denied:   PASRR Number:    Discharge Plan: SNF    Current Diagnoses: Patient Active Problem List   Diagnosis Date Noted  . COPD exacerbation ([place]) 11/16/2016  . HCAP (healthcare-associated pneumonia) 12/31/2015  . Acute encephalopathy 12/31/2015  . Herpes zoster 12/31/2015  . Anemia 12/31/2015  . Acute psychosis associated with endocrine, metabolic, or cerebrovascular disorder 12/21/2015  . Cocaine use disorder, moderate, dependence ([place]) 12/21/2015  . Bipolar I disorder, most recent episode manic, severe with psychotic features ([place]) 12/21/2015  . OCD (obsessive compulsive disorder) 12/21/2015  . PTSD (post-traumatic stress disorder) 12/21/2015  . Abnormal cells of cervix 07/30/2015  . Encounter for general adult medical examination without abnormal findings 07/28/2015  . Primary cancer of right lower lobe of lung ([place]) 07/12/2015  . At risk for noncompliance 06/29/2015  . Spasm 06/26/2015  . Essential (primary) hypertension 06/21/2015  . Lung mass 06/21/2015  . Candida infection of mouth 06/21/2015  . Episodic mood disorder ([place]) 10/14/2014  . Chronic obstructive pulmonary disease ([place]) 10/06/2014  . Pulmonary emphysema ([place]) 10/06/2014  . LBP (low back pain) 09/27/2014  . Tobacco use disorder 09/27/2014    Orientation RESPIRATION BLADDER Height & Weight     Self, Time,  Situation, Place  O2 (4 Liters Oxygen ) Incontinent Weight: 186 lb 11.2 oz (84.7 kg) Height:  5\' 8"  (172.7 cm)  BEHAVIORAL SYMPTOMS/MOOD NEUROLOGICAL BOWEL NUTRITION STATUS   (none)  (none) Continent Diet (Diet: Heart Healthy )  AMBULATORY STATUS COMMUNICATION OF NEEDS Skin   Extensive Assist Verbally Normal                       Personal Care Assistance Level of Assistance  Bathing, Feeding, Dressing Bathing Assistance: Limited assistance Feeding assistance: Independent Dressing Assistance: Limited assistance     Functional Limitations Info  Sight, Hearing, Speech Sight Info: Adequate Hearing Info: Adequate Speech Info: Adequate    SPECIAL CARE FACTORS FREQUENCY  PT (By licensed PT)     PT Frequency:  (3-4 )              Contractures      Additional Factors Info  Code Status, Allergies, Isolation Precautions Code Status Info:  (DNR ) Allergies Info:  (Amoxicillin-pot Clavulanate, Septra Sulfamethoxazole-trimethoprim)     Isolation Precautions Info:  (MRSA Nasal Swab. )     Current Medications (11/21/2016):  This is the current hospital active medication list Current Facility-Administered Medications  Medication Dose Route Frequency Provider Last Rate Last Dose  . acetaminophen (TYLENOL) tablet 500 mg  500 mg Oral Q6H PRN Gladstone Lighter, MD   500 mg at 11/17/16 1926  . benzonatate (TESSALON) capsule 100 mg  100 mg Oral TID Gladstone Lighter, MD   100 mg at 11/20/16 2129  . Chlorhexidine Gluconate Cloth 2 % PADS 6 each  6 each Topical Q0600 [name], MD   6 each at  11/21/16 0600  . chlorpheniramine-HYDROcodone (TUSSIONEX) 10-8 MG/5ML suspension 5 mL  5 mL Oral Q12H Gladstone Lighter, MD   5 mL at 11/20/16 2129  . cyclobenzaprine (FLEXERIL) tablet 10 mg  10 mg Oral BID Gladstone Lighter, MD   10 mg at 11/20/16 2129  . diltiazem (CARDIZEM CD) 24 hr capsule 120 mg  120 mg Oral Daily Gladstone Lighter, MD   120 mg at 11/20/16 0930  . divalproex  (DEPAKOTE) DR tablet 1,000 mg  1,000 mg Oral Q12H Gladstone Lighter, MD   1,000 mg at 11/20/16 2129  . enoxaparin (LOVENOX) injection 40 mg  40 mg Subcutaneous Q24H Gladstone Lighter, MD   40 mg at 11/20/16 2129  . fluticasone (FLONASE) 50 MCG/ACT nasal spray 1 spray  1 spray Each Nare Daily Gladstone Lighter, MD   1 spray at 11/20/16 0930  . guaiFENesin-codeine 100-10 MG/5ML solution 5 mL  5 mL Oral Q12H PRN Gladstone Lighter, MD   5 mL at 11/16/16 1803  . ibuprofen (ADVIL,MOTRIN) tablet 400 mg  400 mg Oral Q6H PRN [name], MD   400 mg at 11/20/16 2129  . ipratropium-albuterol (DUONEB) 0.5-2.5 (3) MG/3ML nebulizer solution 3 mL  3 mL Nebulization Q6H Sudini, Srikar, MD   3 mL at 11/21/16 0758  . levofloxacin (LEVAQUIN) tablet 750 mg  750 mg Oral q morning - 10a Sudini, Srikar, MD   750 mg at 11/20/16 0930  . LORazepam (ATIVAN) tablet 1 mg  1 mg Oral Q6H PRN [name], MD   1 mg at 11/21/16 0010  . mometasone-formoterol (DULERA) 100-5 MCG/ACT inhaler 2 puff  2 puff Inhalation BID Devona Konig A, MD   2 puff at 11/20/16 2048  . multivitamin with minerals tablet 1 tablet  1 tablet Oral Daily Gladstone Lighter, MD   1 tablet at 11/18/16 1249  . mupirocin ointment (BACTROBAN) 2 % 1 application  1 application Nasal BID [name], MD   1 application at 40/97/35 2129  . nystatin (MYCOSTATIN) 100000 UNIT/ML suspension 500,000 Units  5 mL Oral QID [name], MD   500,000 Units at 11/20/16 2129  . ondansetron (ZOFRAN) tablet 4 mg  4 mg Oral Q6H PRN Gladstone Lighter, MD       Or  . ondansetron (ZOFRAN) injection 4 mg  4 mg Intravenous Q6H PRN Gladstone Lighter, MD      . pantoprazole (PROTONIX) EC tablet 40 mg  40 mg Oral Daily Gladstone Lighter, MD   40 mg at 11/20/16 0930  . predniSONE (DELTASONE) tablet 40 mg  40 mg Oral Q breakfast Sudini, Srikar, MD      . risperiDONE (RISPERDAL) tablet 1 mg  1 mg Oral BID Gladstone Lighter, MD   1 mg at 11/20/16 2130  . sodium chloride  flush (NS) 0.9 % injection 10-40 mL  10-40 mL Intracatheter Q12H Gladstone Lighter, MD   10 mL at 11/20/16 2130  . sodium chloride flush (NS) 0.9 % injection 10-40 mL  10-40 mL Intracatheter PRN Gladstone Lighter, MD      . tiotropium [facility]) inhalation capsule 18 mcg  18 mcg Inhalation Daily Devona Konig A, MD   18 mcg at 11/20/16 0932  . vitamin C (ASCORBIC ACID) tablet 500 mg  500 mg Oral Daily Gladstone Lighter, MD   500 mg at 11/20/16 0930     Discharge Medications: Please see discharge summary for a list of discharge medications.  Relevant Imaging Results:  Relevant Lab Results:   Additional Information  (SSN: [id number])  Avi Archuleta, Veronia Beets, LCSW

## 2016-11-21 NOTE — Consult Note (Signed)
Name: Charlene [name] MRN: [id number] DOB: July 16, 1960    ADMISSION DATE:  11/16/2016 CONSULTATION DATE: 11/21/2016  REFERRING MD : Dr. Darvin Neighbours  CHIEF COMPLAINT: Shortness of Breath   BRIEF PATIENT DESCRIPTION:  56 yo female admitted 06/3 with acute hypoxic respiratory failure secondary to AECOPD with acute bronchitis.  Transferred to [place] [facility] 06/8 due to worsening respiratory failure requiring intermittent Bipap CT chest revealing multifocal pneumonia  SIGNIFICANT EVENTS  06/3-Pt admitted to [place] [facility]  06/8-Pt transferred to [facility] Unit   STUDIES:  CT Angio Chest 06/6>>Negative for pulmonary embolus. New extensive bilateral upper lobe airspace disease, patchy airspace opacity in the right middle lobe and small focus of airspace opacity with air bronchograms the left lower lobe most compatible with multifocal pneumonia. New very small right pleural effusion.  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  She presented to [facility] ER 06/3 with worsening shortness of breath and worsening chronic cough onset of symptoms 2 weeks prior to presentation to ER.  Per ER notes daughter stated pt was minimally responsive with lips becoming cyanotic on 06/3.  In the ER it was noted the pt was hypoxic with diffuse wheezing cxr negative.  She was subsequently admitted by hospitalist team for further workup and treatment to the [facility] unit.  On 06/8 pt transferred to ICU due to worsening acute hypoxic respiratory failure secondary to multifocal pneumonia and AECOPD with acute bronchitis requiring intermittent Bipap.    PAST MEDICAL HISTORY :   has a past medical history of Asthma; Bipolar 2 disorder ([place]); Borderline schizophrenia; Falls infrequently; Hypertension; Primary cancer of right lower lobe of lung ([place])  (07/12/2015); and Ulcer.  has a past surgical history that includes Abdominal hysterectomy; Appendectomy; Stomach surgery; Nasal sinus surgery; Cardiac catheterization (N/A, 07/30/2015); chronic back pain; and borderline schizophrenia. Prior to Admission medications   Medication Sig Start Date End Date Taking? Authorizing Provider  acetaminophen (TYLENOL) 500 MG tablet Take 500 mg by mouth every 6 (six) hours as needed.   Yes [provider]  albuterol (PROVENTIL HFA;VENTOLIN HFA) 108 (90 Base) MCG/ACT inhaler Inhale 2 puffs into the lungs every 4 (four) hours as needed for wheezing or shortness of breath.   Yes [provider]  cyclobenzaprine (FLEXERIL) 10 MG tablet Take 10 mg by mouth 2 (two) times daily.  06/26/15  Yes [provider]  diltiazem (CARDIZEM CD) 120 MG 24 hr capsule Take 1 capsule (120 mg total) by mouth daily. 01/11/16  Yes Max Sane, MD  divalproex (DEPAKOTE) 500 MG DR tablet Take 2 tablets (1,000 mg total) by mouth every 12 (twelve) hours. 01/11/16  Yes Max Sane, MD  fluticasone (FLONASE) 50 MCG/ACT nasal spray 1 spray by Each Nare route daily. 07/03/15 08/23/66 Yes [provider]  guaiFENesin-codeine (ROBITUSSIN AC) 100-10 MG/5ML syrup Take 5 mLs by mouth every 12 (twelve) hours as needed for cough. 08/13/16  Yes Cammie Sickle, MD  loratadine (CLARITIN) 10 MG tablet Take 10 mg by mouth daily as needed.    Yes [provider]  Multiple Vitamin (MULTIVITAMIN) tablet Take 1 tablet by mouth daily.   Yes [provider]  omeprazole (PRILOSEC) 20 MG capsule Take 20 mg by mouth daily.   Yes [provider]  risperiDONE (RISPERDAL) 1 MG tablet Take 1 tablet (1 mg  total) by mouth 2 (two) times daily. 01/11/16  Yes Max Sane, MD  Saline (AYR SALINE NASAL DROPS) 0.65 % (Soln) SOLN 2 drops by Each Nare route every four (4) hours as needed. 07/03/15  Yes [provider]  tiotropium (SPIRIVA HANDIHALER) 18 MCG  inhalation capsule Place 18 mcg into inhaler and inhale daily.   Yes [provider]  vitamin C (ASCORBIC ACID) 500 MG tablet Take 500 mg by mouth daily.   Yes [provider]  sucralfate (CARAFATE) 1 g tablet Take 1 tablet (1 g total) by mouth 4 (four) times daily. Dissolve in 2-3 tbsp warm water, swish and swallow. Patient not taking: Reported on 11/16/2016 11/01/15   Forest Gleason, MD   Allergies  Allergen Reactions  . Amoxicillin-Pot Clavulanate Hives    Also vomiting  . Septra [Sulfamethoxazole-Trimethoprim] Hives and Nausea And Vomiting    FAMILY HISTORY:  family history includes Hypertension in her father. SOCIAL HISTORY:  reports that she quit smoking about 17 months ago. She has a 15.00 pack-year smoking history. She has never used smokeless tobacco. She reports that she uses drugs, including Cocaine. She reports that she does not drink alcohol.  REVIEW OF SYSTEMS:   Unable to assess pt on Bipap   SUBJECTIVE:  Pt currently on Bipap   VITAL SIGNS: Temp:  [97.5 F (36.4 C)] 97.5 F (36.4 C) (06/08 0743) Pulse Rate:  [51-95] 68 (06/08 1207) Resp:  [18] 18 (06/08 0743) BP: (131-150)/(75-85) 150/85 (06/08 1207) SpO2:  [91 %-99 %] 91 % (06/08 0959)  PHYSICAL EXAMINATION: General: acutely ill appearing Caucasian female, NAD Neuro: lethargic, follows commands, PERRL HEENT: supple, no JVD Cardiovascular: nsr, s1s2, no M/R/G Lungs: diminished with expiratory wheezes throughout, even, non labored on Bipap  Abdomen: +BS x4, soft, non tender, non distended  Musculoskeletal: normal bulk and tone, no edema Skin: intact no rashes or lesions    Recent Labs Lab 11/16/16 1240 11/17/16 0350 11/20/16 0406  NA 132* 139 140  K 3.7 3.6 4.0  CL 97* 105 100*  CO2 26 27 32  BUN 9 8 10   CREATININE 0.63 0.54 0.53  GLUCOSE 105* 182* 196*    Recent Labs Lab 11/16/16 1240 11/17/16 0350 11/20/16 0406  HGB 12.2 11.5* 11.3*  HCT 35.3 33.2* 33.6*  WBC 8.4 9.7 14.6*   PLT 138* 136* 198   No results found.  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 at [facility] currently in remission), Asthma, Bipolar Disorder, and Borderline Schizophrenia P: Prn Bipap  Maintain O2 sats 88% to 92% Stat CXR today  Prn ABG's  Continue scheduled bronchodilator therapy Will add nebulized steroids and iv steroids  Continue abx  Follow cultures will obtain sputum culture Trend WBC and monitor fever curve Trend PCT Monitor CBG's q2hrs  Hypoglycemic protocol  Lovenox for VTE prophylaxis  Trend CBC Monitor for s/sx of bleeding Transfuse for hgb <7  Marda Stalker, [place] Pager [phone] (please enter 7 digits) PCCM Consult Pager [phone] (please enter 7 digits)  PCCM ATTENDING ATTESTATION:  I have evaluated patient with the APP [place], reviewed database in its entirety and discussed care plan in detail. In addition, this patient was discussed on multidisciplinary rounds.   Important exam findings: Lethargic but arouses to voice Follows commands Poorly oriented No respiratory distress HEENT within normal limits Cranial nerves intact No wheezes,  breath sounds mildly diminished Regular rate and rhythm with no murmurs Abdomen soft and nontender Extremities without edema Neurologic exam is without focal deficits-strength is diminished bilaterally but symmetric, deep tendon reflexes are symmetric  Major problems addressed by PCCM team: Lethargy Mild hypercarbic respiratory failure - this degree of hypercarbia is probably not sufficient to explain her lethargy   PLAN/REC: Change BiPAP to PRN Supplemental oxygen as needed Avoid all medications that might be contributing to sedation Monitor and stepdown unit overnight. If no problems, she can probably transfer out  of the SDU in the a.m. 06/09   Merton Border, MD PCCM service Mobile [phone] Pager [phone] 11/21/2016 5:11 PM

## 2016-11-21 NOTE — Clinical Social Work Placement (Signed)
   CLINICAL SOCIAL WORK PLACEMENT  NOTE  Date:  11/21/2016  Patient Details  Name: Charlene [name] MRN: [id number] Date of Birth: 13-Jul-1960  Clinical Social Work is seeking post-discharge placement for this patient at the [place] level of care (*CSW will initial, date and re-position this form in  chart as items are completed):  Yes   Patient/family provided with [place] Work Department's list of facilities offering this level of care within the geographic area requested by the patient (or if unable, by the patient's family).  Yes   Patient/family informed of their freedom to choose among providers that offer the needed level of care, that participate in Medicare, Medicaid or managed care program needed by the patient, have an available bed and are willing to accept the patient.  Yes   Patient/family informed of [facility]'s ownership interest in [facility] and [facility], as well as of the fact that they are under no obligation to receive care at these facilities.  PASRR submitted to EDS on 11/21/16     PASRR number received on 11/21/16     Existing PASRR number confirmed on       FL2 transmitted to all facilities in geographic area requested by pt/family on 11/21/16     FL2 transmitted to all facilities within larger geographic area on       Patient informed that his/her managed care company has contracts with or will negotiate with certain facilities, including the following:        Yes   Patient/family informed of bed offers received.  Patient chooses bed at       Physician recommends and patient chooses bed at      Patient to be transferred to   on  .  Patient to be transferred to facility by       Patient family notified on   of transfer.  Name of family member notified:        PHYSICIAN       Additional Comment:    _______________________________________________ Christen Wardrop, Veronia Beets, LCSW 11/21/2016, 5:10 PM

## 2016-11-21 NOTE — Progress Notes (Signed)
PT Cancellation Note  Patient Details Name: Charlene [name] MRN: [id number] DOB: March 24, 1961   Cancelled Treatment:    Reason Eval/Treat Not Completed: Patient not medically ready Pt with change in status, transferred to CCU.  PT orders will be completed and she will need new PT orders when/if appropriate.  Kreg Shropshire, DPT 11/21/2016, 1:54 PM

## 2016-11-21 NOTE — Clinical Social Work Note (Signed)
Clinical Social Work Assessment  Patient Details  Name: Charlene [name] MRN: [id number] Date of Birth: Dec 26, 1960  Date of referral:  11/21/16               Reason for consult:  Facility Placement                Permission sought to share information with:  Chartered certified accountant granted to share information::  Yes, Verbal Permission Granted  Name::      [place]::     Relationship::     Contact Information:     Housing/Transportation Living arrangements for the past 2 months:  [place] of Information:  Parent Patient Interpreter Needed:  None Criminal Activity/Legal Involvement Pertinent to Current Situation/Hospitalization:  No - Comment as needed Significant Relationships:  Parents Lives with:  Parents Do you feel safe going back to the place where you live?    Need for family participation in patient care:  Yes (Comment)  Care giving concerns:  Patient lives in [place] with her father Quenton Fetter and her mother.    Social Worker assessment / plan:  Holiday representative (CSW) reviewed chart and noted that PT is recommending SNF. CSW attempted to meet with patient however she was not arousable. CSW notified charge nurse and patient was transferred to ICU. CSW contacted patient's father Quenton Fetter. Per Jeneen Rinks patient lives in [place] with him and her mother (his wife). Jeneen Rinks reported that patient has been to [facility] group home in the past and is now living with them. Jeneen Rinks reported that patient has numerous medical issues. Per Jeneen Rinks patient has a walker at home but does not use it and is on room air at baseline. Per Jeneen Rinks patient's lung cancer is in remission and she is no longer undergoing chemo or radiation treatment. Per Jeneen Rinks patient has several brothers that have been to visit her at [facility]. Jeneen Rinks reported that his wife was discharged from 1C yesterday and he is on a zpak for pneumonia. Jeneen Rinks reported that their  son passed away in 04-07-16 from cancer and they are still grieving. CSW provided emotional support. CSW made Jeneen Rinks aware that patient has transferred to the ICU. 1A charge nurse gave Jeneen Rinks an update. CSW also explained to Jeneen Rinks that PT is recommending SNF. CSW explained that patient has medicaid only which means she will have to stay at the SNF for 30 days, sign over her SSI check to the SNF and likely go outside of [facility]. Per Jeneen Rinks patient does receive SSI. Jeneen Rinks verbalized his understanding and reported that he would consider SNF and talk with his family. FL2 complete and faxed out. PASARR is completed. CSW provided SNF bed offers to [place]. James asked about [facility] in [place]. CSW explained that [facility] will not be an option because they do not have any medicaid beds.  Jeneen Rinks verbalized his understanding. CSW will continue to follow and assist as needed.   Employment status:  Disabled (Comment on whether or not currently receiving Disability) Insurance information:  Medicaid [place] PT Recommendations:  [place] / Referral to community resources:  [place]  Patient/Family's Response to care:  Patient's father Jeneen Rinks is considering SNF.   Patient/Family's Understanding of and Emotional Response to Diagnosis, Current Treatment, and Prognosis:  Patient's father Jeneen Rinks was very pleasant and thanked CSW for calling.   Emotional Assessment Appearance:  Appears stated age Attitude/Demeanor/Rapport:  Unable to Assess Affect (  typically observed):  Unable to Assess Orientation:  Oriented to Self, Fluctuating Orientation (Suspected and/or reported [place]) Alcohol / Substance use:  Not Applicable Psych involvement (Current and /or in the community):  No (Comment)  Discharge Needs  Concerns to be addressed:  Discharge Planning Concerns Readmission within the last 30 days:  No Current discharge risk:  Dependent with Mobility, Chronically ill Barriers  to Discharge:  Continued Medical Work up   [employer], Veronia Beets, LCSW 11/21/2016, 5:11 PM

## 2016-11-21 NOTE — Progress Notes (Signed)
[place] [facility] [place] NAME: Charlene [name]    MR#:  [id number]  DATE OF BIRTH:  06/20/1960  SUBJECTIVE:  CHIEF COMPLAINT:   Chief Complaint  Patient presents with  . Shortness of Breath   Paged by nurse as patient was obtunded and waking  Up only to sternal rub. On arrival patient is drowzy but waking up on calling her name. Was anxious yesterday.  REVIEW OF SYSTEMS:    Review of Systems  Constitutional: Positive for malaise/fatigue. Negative for chills and fever.  HENT: Negative for sore throat.   Eyes: Negative for blurred vision, double vision and pain.  Respiratory: Positive for cough, sputum production, shortness of breath and wheezing. Negative for hemoptysis.   Cardiovascular: Negative for chest pain, palpitations, orthopnea and leg swelling.  Gastrointestinal: Negative for abdominal pain, constipation, diarrhea, heartburn, nausea and vomiting.  Genitourinary: Negative for dysuria and hematuria.  Musculoskeletal: Negative for back pain and joint pain.  Skin: Negative for rash.  Neurological: Positive for weakness. Negative for sensory change, speech change, focal weakness and headaches.  Endo/Heme/Allergies: Does not bruise/bleed easily.  Psychiatric/Behavioral: Negative for depression. The patient is not nervous/anxious.    DRUG ALLERGIES:   Allergies  Allergen Reactions  . Amoxicillin-Pot Clavulanate Hives    Also vomiting  . Septra [Sulfamethoxazole-Trimethoprim] Hives and Nausea And Vomiting    VITALS:  Blood pressure (!) 144/78, pulse 64, temperature 97.5 F (36.4 C), temperature source Axillary, resp. rate 18, height 5\' 8"  (1.727 m), weight 84.7 kg (186 lb 11.2 oz), SpO2 91 %.  PHYSICAL EXAMINATION:   Physical Exam  GENERAL:  56 y.o.-year-old patient lying in the bed EYES: Pupils equal, round, reactive to light and accommodation. No scleral icterus. Extraocular muscles intact.  HEENT: Head atraumatic,  normocephalic. Oropharynx and nasopharynx clear.  NECK:  Supple, no jugular venous distention. No thyroid enlargement, no tenderness.  LUNGS: Increased work of breathing. Conversational dyspnea. Bilateral wheezing. CARDIOVASCULAR: S1, S2 normal. No murmurs, rubs, or gallops.  ABDOMEN: Soft, nontender, nondistended. Bowel sounds present. No organomegaly or mass.  EXTREMITIES: No cyanosis, clubbing or edema b/l.    NEUROLOGIC: Cranial nerves II through XII are intact. No focal Motor or sensory deficits b/l. PSYCHIATRIC: The patient is drowzy SKIN: No obvious rash, lesion, or ulcer.   LABORATORY PANEL:   CBC  Recent Labs Lab 11/20/16 0406  WBC 14.6*  HGB 11.3*  HCT 33.6*  PLT 198   ------------------------------------------------------------------------------------------------------------------ Chemistries   Recent Labs Lab 11/16/16 1240  11/20/16 0406  NA 132*  < > 140  K 3.7  < > 4.0  CL 97*  < > 100*  CO2 26  < > 32  GLUCOSE 105*  < > 196*  BUN 9  < > 10  CREATININE 0.63  < > 0.53  CALCIUM 8.3*  < > 8.8*  AST 32  --   --   ALT 12*  --   --   ALKPHOS 57  --   --   BILITOT 0.7  --   --   < > = values in this interval not displayed. ------------------------------------------------------------------------------------------------------------------  Cardiac Enzymes  Recent Labs Lab 11/16/16 1240  TROPONINI <0.03   ------------------------------------------------------------------------------------------------------------------ RADIOLOGY:  Ct Angio Chest Pe W Or Wo Contrast  Result Date: 11/19/2016 CLINICAL DATA:  Increasing shortness of breath and patient with a history of COPD and lung carcinoma. EXAM: CT ANGIOGRAPHY CHEST WITH CONTRAST TECHNIQUE: Multidetector CT imaging of the chest was performed using the  standard protocol during bolus administration of intravenous contrast. Multiplanar CT image reconstructions and MIPs were obtained to evaluate the vascular  anatomy. CONTRAST:  75 cc Isovue 370. COMPARISON:  CT chest, abdomen and pelvis 08/21/2016 and 01/25/2016. PA and lateral chest 11/16/2016. FINDINGS: Cardiovascular: No pulmonary embolus is identified. Heart size is normal. Port-A-Cath is noted. No pericardial effusion. Mediastinum/Nodes: High right paratracheal node measuring 0.8 cm on image 12 is unchanged. No pathologically enlarged lymph nodes in the axilla, hila or mediastinum. New Lungs/Pleura: New trace left pleural effusion is identified. The patient has new airspace disease in the upper lobes bilaterally superimposed on emphysematous change. Small focus of airspace disease is also identified in the right middle lobe. There is also new small focus of airspace disease in the left lower lobe air bronchograms present. Radiation fibrosis change on the right seen on the prior examination is now obscured. Small focus of patchy atelectasis or scar right lower lobe is unchanged. Upper Abdomen: Negative. Musculoskeletal: No acute bony abnormality. Mild lower thoracic spondylosis noted. Review of the MIP images confirms the above findings. IMPRESSION: Negative for pulmonary embolus. New extensive bilateral upper lobe airspace disease, patchy airspace opacity in the right middle lobe and small focus of airspace opacity with air bronchograms the left lower lobe most compatible with multifocal pneumonia. New very small right pleural effusion. Electronically Signed   By: Inge Rise M.D.   On: 11/19/2016 12:26   ASSESSMENT AND PLAN:   Charlene [name]  is a 56 y.o. female with a known history of COPD not on home oxygen, Bipolar disorder, borderline schizophrenia, hypertension, history of right lung cancer currently in remission presents to hospital secondary to worsening shortness of breath.  # Acute encephalopathy Will check STAT ABG. Could be due to ativan. Limit use. Will need Bipap if hypercarbic.  # Acute respiratory failure with hypoxia-secondary to  COPD exacerbation with acute bronchitis. - steroids, Antibiotics - Scheduled Nebulizers -Wean O2 as tolerated - CTA chest showed bilateral pneumonia - Pulmonary has seen patient. Still has significant wheezing  # Bipolar disorder with anxiety  On Depakote and risperidone.  # chronic sinus tachycardia On cardizem. ON hold due to low normal BP  # history of lung cancer-diagnosed at [facility] in January 2017, last chemotherapy in April 2017. Follows up at the cancer center.  # DVT prophylaxis-Lovenox  All the records are reviewed and case discussed with Care Management/Social Workerr. Management plans discussed with the patient, family and they are in agreement.  CODE STATUS: DNR  DVT Prophylaxis: SCDs  TOTAL CC TIME TAKING CARE OF THIS PATIENT: 35 minutes.   POSSIBLE D/C IN 2-3 DAYS, DEPENDING ON CLINICAL CONDITION.  [name] R M.D on 11/21/2016 at 10:42 AM  Between 7am to 6pm - Pager - [phone]  After 6pm go to [email or web address] - password [email or web address] [place] [facility]  Office  [phone]  CC: Primary care physician; Sison, Adele Dan, MD  Note: This dictation was prepared with Dragon dictation along with smaller phrase technology. Any transcriptional errors that result from this process are unintentional.

## 2016-11-22 DIAGNOSIS — R5383 Other fatigue: Secondary | ICD-10-CM

## 2016-11-22 DIAGNOSIS — J9690 Respiratory failure, unspecified, unspecified whether with hypoxia or hypercapnia: Secondary | ICD-10-CM

## 2016-11-22 DIAGNOSIS — J9601 Acute respiratory failure with hypoxia: Secondary | ICD-10-CM

## 2016-11-22 LAB — GLUCOSE, CAPILLARY
GLUCOSE-CAPILLARY: 209 mg/dL — AB (ref 65–99)
GLUCOSE-CAPILLARY: 127 mg/dL — AB (ref 65–99)
Glucose-Capillary: 155 mg/dL — ABNORMAL HIGH (ref 65–99)
Glucose-Capillary: 146 mg/dL — ABNORMAL HIGH (ref 65–99)
Glucose-Capillary: 166 mg/dL — ABNORMAL HIGH (ref 65–99)

## 2016-11-22 LAB — BASIC METABOLIC PANEL
ANION GAP: 5 (ref 5–15)
BUN: 17 mg/dL (ref 6–20)
CALCIUM: 8.5 mg/dL — AB (ref 8.9–10.3)
CO2: 34 mmol/L — AB (ref 22–32)
CREATININE: 0.57 mg/dL (ref 0.44–1.00)
Chloride: 100 mmol/L — ABNORMAL LOW (ref 101–111)
GFR calc Af Amer: >60 mL/min (ref >60)
GLUCOSE: 158 mg/dL — AB (ref 65–99)
Potassium: 4.5 mmol/L (ref 3.5–5.1)
Sodium: 139 mmol/L (ref 135–145)

## 2016-11-22 LAB — HEPATIC FUNCTION PANEL
ALT: 17 U/L (ref 14–54)
AST: 33 U/L (ref 15–41)
Albumin: 2.3 g/dL — ABNORMAL LOW (ref 3.5–5.0)
Alkaline Phosphatase: 52 U/L (ref 38–126)
Total Bilirubin: 0.4 mg/dL (ref 0.3–1.2)
Total Protein: 5.5 g/dL — ABNORMAL LOW (ref 6.5–8.1)

## 2016-11-22 LAB — AMMONIA: AMMONIA: 30 umol/L (ref 9–35)

## 2016-11-22 MED ORDER — ORAL CARE MOUTH RINSE
15.0000 mL | Freq: Two times a day (BID) | OROMUCOSAL | Status: DC
  Administered 2016-11-22 – 2016-11-23 (×3): 15 mL via OROMUCOSAL

## 2016-11-22 MED ORDER — INSULIN ASPART 100 UNIT/ML ~~LOC~~ SOLN
0.0000 [IU] | Freq: Every day | SUBCUTANEOUS | Status: DC
Start: 2016-11-22 — End: 2016-11-24
  Filled 2016-11-22: qty 3

## 2016-11-22 MED ORDER — SODIUM CHLORIDE 0.9 % IV SOLN
INTRAVENOUS | Status: DC
  Administered 2016-11-22: 01:00:00 via INTRAVENOUS

## 2016-11-22 MED ORDER — INSULIN ASPART 100 UNIT/ML ~~LOC~~ SOLN
0.0000 [IU] | Freq: Three times a day (TID) | SUBCUTANEOUS | Status: DC
  Administered 2016-11-22: 2 [IU] via SUBCUTANEOUS
  Administered 2016-11-22: 5 [IU] via SUBCUTANEOUS
  Administered 2016-11-23: 0.3 [IU] via SUBCUTANEOUS
  Administered 2016-11-23: 3 [IU] via SUBCUTANEOUS
  Filled 2016-11-22: qty 3
  Filled 2016-11-22: qty 2
  Filled 2016-11-22: qty 5

## 2016-11-22 NOTE — Progress Notes (Signed)
[facility] [place] NAME: Betzabe Bevans    MR#:  [id number]  DATE OF BIRTH:  May 20, 1961  SUBJECTIVE:  CHIEF COMPLAINT:   Chief Complaint  Patient presents with  . Shortness of Breath   Drowzy. Wakes up on calling her name  REVIEW OF SYSTEMS:    Review of Systems  Constitutional: Positive for malaise/fatigue. Negative for chills and fever.  HENT: Negative for sore throat.   Eyes: Negative for blurred vision, double vision and pain.  Respiratory: Positive for cough, sputum production, shortness of breath and wheezing. Negative for hemoptysis.   Cardiovascular: Negative for chest pain, palpitations, orthopnea and leg swelling.  Gastrointestinal: Negative for abdominal pain, constipation, diarrhea, heartburn, nausea and vomiting.  Genitourinary: Negative for dysuria and hematuria.  Musculoskeletal: Negative for back pain and joint pain.  Skin: Negative for rash.  Neurological: Positive for weakness. Negative for sensory change, speech change, focal weakness and headaches.  Endo/Heme/Allergies: Does not bruise/bleed easily.  Psychiatric/Behavioral: Negative for depression. The patient is not nervous/anxious.    DRUG ALLERGIES:   Allergies  Allergen Reactions  . Amoxicillin-Pot Clavulanate Hives    Also vomiting  . Septra [Sulfamethoxazole-Trimethoprim] Hives and Nausea And Vomiting    VITALS:  Blood pressure (!) 107/43, pulse (!) 102, temperature 97 F (36.1 C), temperature source Oral, resp. rate (!) 24, height 5\' 8"  (1.727 m), weight 84.7 kg (186 lb 11.2 oz), SpO2 96 %.  PHYSICAL EXAMINATION:   Physical Exam  GENERAL:  56 y.o.-year-old patient lying in the bed EYES: Pupils equal, round, reactive to light and accommodation. No scleral icterus. Extraocular muscles intact.  HEENT: Head atraumatic, normocephalic. Oropharynx and nasopharynx clear.  NECK:  Supple, no jugular venous distention. No thyroid enlargement, no tenderness.   LUNGS: Decreased air entry bilaterally CARDIOVASCULAR: S1, S2 normal. No murmurs, rubs, or gallops.  ABDOMEN: Soft, nontender, nondistended. Bowel sounds present. No organomegaly or mass.  EXTREMITIES: No cyanosis, clubbing or edema b/l.    NEUROLOGIC: Cranial nerves II through XII are intact. No focal Motor or sensory deficits b/l. PSYCHIATRIC: The patient is drowzy SKIN: No obvious rash, lesion, or ulcer.   LABORATORY PANEL:   CBC  Recent Labs Lab 11/20/16 0406  WBC 14.6*  HGB 11.3*  HCT 33.6*  PLT 198   ------------------------------------------------------------------------------------------------------------------ Chemistries   Recent Labs Lab 11/22/16 0359  NA 139  K 4.5  CL 100*  CO2 34*  GLUCOSE 158*  BUN 17  CREATININE 0.57  CALCIUM 8.5*  AST 33  ALT 17  ALKPHOS 52  BILITOT 0.4   ------------------------------------------------------------------------------------------------------------------  Cardiac Enzymes  Recent Labs Lab 11/16/16 1240  TROPONINI <0.03   ------------------------------------------------------------------------------------------------------------------ RADIOLOGY:  Dg Chest Port 1 View  Result Date: 11/21/2016 CLINICAL DATA:  Shortness of breath and hypoxia EXAM: PORTABLE CHEST 1 VIEW COMPARISON:  11/19/2016 FINDINGS: Cardiac shadow is within normal limits. The lungs are well aerated bilaterally with stable upper lobe changes similar to that seen on recent CT examination. No sizable effusion is seen. Right chest wall port is noted. No bony abnormality is seen. IMPRESSION: Bilateral upper lobe airspace disease stable from recent CT. Electronically Signed   By: Inez Catalina M.D.   On: 11/21/2016 13:33   ASSESSMENT AND PLAN:   Kawthar Ennen  is a 56 y.o. female with a known history of COPD not on home oxygen, Bipolar disorder, borderline schizophrenia, hypertension, history of right lung cancer currently in remission presents to  hospital secondary to worsening shortness of  breath.  # Acute encephalopathy Likely due to hypoxia and acute illness Some improvement Was on Bipap overnight.  # Acute respiratory failure with hypoxia-secondary to COPD exacerbation with acute bronchitis. - steroids, Antibiotics - Scheduled Nebulizers -Wean O2 as tolerated - CTA chest showed bilateral pneumonia - Appreciate pulmonary help  # Bipolar disorder with anxiety  On Depakote. Risperidone held.  # chronic sinus tachycardia On cardizem.  # history of lung cancer-diagnosed at [facility] in January 2017, last chemotherapy in April 2017. Follows up at the cancer center.  # DVT prophylaxis-Lovenox  All the records are reviewed and case discussed with Care Management/Social Workerr. Management plans discussed with the patient, family and they are in agreement.  CODE STATUS: DNR  DVT Prophylaxis: SCDs  TOTAL TIME TAKING CARE OF THIS PATIENT: 35 minutes.   POSSIBLE D/C IN 2-3 DAYS, DEPENDING ON CLINICAL CONDITION.  [name] R M.D on 11/22/2016 at 1:09 PM  Between 7am to 6pm - Pager - [phone]  After 6pm go to [email or web address] - password [email or web address] [facility]  Office  [phone]  CC: Primary care physician; Sison, Adele Dan, MD  Note: This dictation was prepared with Dragon dictation along with smaller phrase technology. Any transcriptional errors that result from this process are unintentional.

## 2016-11-22 NOTE — Progress Notes (Signed)
   Name: Charlene [name] MRN: [id number] DOB: 20-Dec-1960    ADMISSION DATE:  11/16/2016 CONSULTATION DATE: 11/21/2016  REFERRING MD : Dr. Darvin Neighbours  CHIEF COMPLAINT: Shortness of Breath   BRIEF PATIENT DESCRIPTION:  56 yo female admitted 06/3 with acute hypoxic respiratory failure secondary to AECOPD with acute bronchitis.  Transferred to [place] [facility] 06/8 due to worsening respiratory failure requiring intermittent Bipap CT chest revealing multifocal pneumonia  SIGNIFICANT EVENTS  06/3-Pt admitted to [place] [facility]  06/8-Pt transferred to [facility]   STUDIES:  CT Angio Chest 06/6>>Negative for pulmonary embolus. New extensive bilateral upper lobe airspace disease, patchy airspace opacity in the right middle lobe and small focus of airspace opacity with air bronchograms the left lower lobe most compatible with multifocal pneumonia. New very small right pleural effusion.   SUBJECTIVE:  She is more alert and better oriented. However she remained somewhat lethargic. She has no new complaints. She denies pain and shortness of breath. She has no cough. She appears comfortable on nasal cannula oxygen.   VITAL SIGNS: Temp:  [97 F (36.1 C)-98.2 F (36.8 C)] 97 F (36.1 C) (06/09 0800) Pulse Rate:  [65-102] 102 (06/09 1000) Resp:  [16-32] 24 (06/09 1000) BP: (103-150)/(43-97) 107/43 (06/09 1000) SpO2:  [88 %-100 %] 96 % (06/09 1000) FiO2 (%):  [35 %-50 %] 35 % (06/09 0816)  PHYSICAL EXAMINATION: No acute distress Chronically ill-appearing HEENT: NCAT, sclerae white Neuro: Cranial nerves intact, moves all extremities symmetrically, sensory intact, deep tendon reflexes symmetric Neck: No JVD Chest: Few crackles, no wheezes Cardiac: Regular, no murmurs Abdomen: Soft, bowel sounds present, nontender Extremities: Warm, no edema   Recent Labs Lab 11/17/16 0350 11/20/16 0406 11/22/16 0359  NA 139 140 139  K 3.6 4.0 4.5  CL 105 100* 100*  CO2 27 32 34*  BUN 8 10 17     CREATININE 0.54 0.53 0.57  GLUCOSE 182* 196* 158*    Recent Labs Lab 11/16/16 1240 11/17/16 0350 11/20/16 0406  HGB 12.2 11.5* 11.3*  HCT 35.3 33.2* 33.6*  WBC 8.4 9.7 14.6*  PLT 138* 136* 198   Dg Chest Port 1 View  Result Date: 11/21/2016 CLINICAL DATA:  Shortness of breath and hypoxia EXAM: PORTABLE CHEST 1 VIEW COMPARISON:  11/19/2016 FINDINGS: Cardiac shadow is within normal limits. The lungs are well aerated bilaterally with stable upper lobe changes similar to that seen on recent CT examination. No sizable effusion is seen. Right chest wall port is noted. No bony abnormality is seen. IMPRESSION: Bilateral upper lobe airspace disease stable from recent CT. Electronically Signed   By: Inez Catalina M.D.   On: 11/21/2016 13:33    ASSESSMENT / PLAN: Mild acute respiratory failure with hypoxemia Bilateral upper lobe infiltrates of unclear etiology. 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 Seems to be improving  I do not think that the lethargy can be explained by very mild hypercarbia  P: I would like to keep her in the stepdown unit through today I have stopped all medications that might be sedating We will try to advance diet and activity Continue low-flow oxygen Continue levofloxacin Continue nebulized steroids and bronchodilators Recheck x-ray in a.m. 06/10  Merton Border, MD PCCM service Mobile [phone] Pager [phone] 11/22/2016 11:24 AM

## 2016-11-23 ENCOUNTER — Inpatient Hospital Stay: Payer: Medicaid Other

## 2016-11-23 LAB — BASIC METABOLIC PANEL
Anion gap: 4 — ABNORMAL LOW (ref 5–15)
BUN: 14 mg/dL (ref 6–20)
CALCIUM: 8.3 mg/dL — AB (ref 8.9–10.3)
CO2: 33 mmol/L — ABNORMAL HIGH (ref 22–32)
CREATININE: 0.54 mg/dL (ref 0.44–1.00)
Chloride: 102 mmol/L (ref 101–111)
GFR calc non Af Amer: >60 mL/min (ref >60)
Glucose, Bld: 143 mg/dL — ABNORMAL HIGH (ref 65–99)
Potassium: 4 mmol/L (ref 3.5–5.1)
SODIUM: 139 mmol/L (ref 135–145)

## 2016-11-23 LAB — PROCALCITONIN: Procalcitonin: <0.1 ng/mL

## 2016-11-23 LAB — CBC
HCT: 35.6 % (ref 35.0–47.0)
Hemoglobin: 12.2 g/dL (ref 12.0–16.0)
MCH: 32.4 pg (ref 26.0–34.0)
MCHC: 34.2 g/dL (ref 32.0–36.0)
MCV: 94.9 fL (ref 80.0–100.0)
PLATELETS: 248 10*3/uL (ref 150–440)
RBC: 3.75 MIL/uL — AB (ref 3.80–5.20)
RDW: 14.6 % — ABNORMAL HIGH (ref 11.5–14.5)
WBC: 9.8 10*3/uL (ref 3.6–11.0)

## 2016-11-23 LAB — GLUCOSE, CAPILLARY
GLUCOSE-CAPILLARY: 186 mg/dL — AB (ref 65–99)
GLUCOSE-CAPILLARY: 115 mg/dL — AB (ref 65–99)
GLUCOSE-CAPILLARY: 132 mg/dL — AB (ref 65–99)
Glucose-Capillary: 181 mg/dL — ABNORMAL HIGH (ref 65–99)

## 2016-11-23 MED ORDER — TIOTROPIUM BROMIDE MONOHYDRATE 18 MCG IN CAPS
18.0000 ug | ORAL_CAPSULE | Freq: Every day | RESPIRATORY_TRACT | Status: DC
Start: 2016-11-23 — End: 2016-11-24
  Administered 2016-11-23: 18 ug via RESPIRATORY_TRACT
  Filled 2016-11-23: qty 5

## 2016-11-23 MED ORDER — RISPERIDONE 1 MG PO TABS
1.0000 mg | ORAL_TABLET | Freq: Every day | ORAL | Status: DC
  Administered 2016-11-23: 21:00:00 1 mg via ORAL
  Filled 2016-11-23: qty 1

## 2016-11-23 MED ORDER — LEVOFLOXACIN 500 MG PO TABS
500.0000 mg | ORAL_TABLET | Freq: Every day | ORAL | Status: DC
  Administered 2016-11-23 – 2016-11-24 (×2): 500 mg via ORAL
  Filled 2016-11-23 (×2): qty 1

## 2016-11-23 MED ORDER — DIVALPROEX SODIUM 500 MG PO DR TAB
500.0000 mg | DELAYED_RELEASE_TABLET | Freq: Two times a day (BID) | ORAL | Status: DC
  Administered 2016-11-23 – 2016-11-24 (×3): 500 mg via ORAL
  Filled 2016-11-23 (×2): qty 1
  Filled 2016-11-23: qty 2

## 2016-11-23 NOTE — Progress Notes (Signed)
   Name: Charlene [name] MRN: [id number] DOB: [birth year]    ADMISSION DATE:  11/16/2016 CONSULTATION DATE: 11/21/2016  REFERRING MD : Dr. Darvin Neighbours  CHIEF COMPLAINT: Shortness of Breath   BRIEF PATIENT DESCRIPTION:  56 yo female admitted 06/3 with acute hypoxic respiratory failure secondary to AECOPD with acute bronchitis.  Transferred to [place] [facility] 06/8 due to worsening respiratory failure requiring intermittent Bipap CT chest revealing multifocal pneumonia  SIGNIFICANT EVENTS  06/3-Pt admitted to [place] [facility]  06/8-Pt transferred to [facility]  06/10 much improved cognition. Transferred to [place] floor  STUDIES:  CT Angio Chest 06/6>>Negative for pulmonary embolus. New extensive bilateral upper lobe airspace disease, patchy airspace opacity in the right middle lobe and small focus of airspace opacity with air bronchograms the left lower lobe most compatible with multifocal pneumonia. New very small right pleural effusion.   SUBJECTIVE:  Much improved cognition. Fully alert and oriented. Flat affect. No distress.  VITAL SIGNS: Temp:  [97.9 F (36.6 C)-98.3 F (36.8 C)] 97.9 F (36.6 C) (06/10 1041) Pulse Rate:  [54-108] 98 (06/10 0900) Resp:  [9-22] 20 (06/10 1041) BP: (101-160)/(61-105) 149/85 (06/10 1041) SpO2:  [71 %-100 %] 95 % (06/10 1041) FiO2 (%):  [35 %] 35 % (06/10 0100)  PHYSICAL EXAMINATION: No acute distress Chronically ill-appearing HEENT: NCAT, sclerae white Neuro: No focal deficits Neck: No JVD Chest: Clear without wheezes Cardiac: Regular, no murmurs Abdomen: Soft, bowel sounds present, nontender Extremities: Warm, no edema   Recent Labs Lab 11/20/16 0406 11/22/16 0359 11/23/16 0434  NA 140 139 139  K 4.0 4.5 4.0  CL 100* 100* 102  CO2 32 34* 33*  BUN 10 17 14   CREATININE 0.53 0.57 0.54  GLUCOSE 196* 158* 143*    Recent Labs Lab 11/17/16 0350 11/20/16 0406 11/23/16 0434  HGB 11.5* 11.3* 12.2  HCT 33.2* 33.6* 35.6  WBC 9.7  14.6* 9.8  PLT 136* 198 248   Chest x-ray: Improved infiltrate in the left upper lobe, persistent vague right upper lobe infiltrate  ASSESSMENT / PLAN: Mild acute respiratory failure with hypoxemia -  Upper lobe infiltrates of unclear etiology. PCT normal  Left upper lobe has cleared. RUL infiltrate is vague but persists Hypertension, controlled Hypoglycemia, resolved Lethargy, resolved Hx: Small cwell carcinoma of Lung (dx Jan 2017 at [facility] currently in remission), Asthma, Bipolar Disorder, and Borderline Schizophrenia   P: Transfer to MedSurg floor today I have resumed valproic acid and risperidone at reduced doses Advance diet and activity Continue low-flow oxygen as needed Continue levofloxacin - changed to po and complete 3 more days empirically Continue Spiriva Change nebulized bronchodilators to prn  After transfer,PCCM will sign off. Please call if we can be of further assistance   Merton Border, MD PCCM service Mobile [phone] Pager [phone] 11/23/2016 10:43 AM

## 2016-11-23 NOTE — Plan of Care (Signed)
Problem: Activity: Goal: Ability to implement measures to reduce episodes of fatigue will improve Outcome: Progressing ICU report upon transfer to the floor indicated pt oob to bsc

## 2016-11-23 NOTE — Progress Notes (Signed)
[place] [facility] [place] NAME: Charlene [name]    MR#:  [id number]  DATE OF BIRTH:  1960/08/15  SUBJECTIVE:  CHIEF COMPLAINT:   Chief Complaint  Patient presents with  . Shortness of Breath   Patient is awake and oriented. Breathing is better.   REVIEW OF SYSTEMS:    Review of Systems  Constitutional: Positive for malaise/fatigue. Negative for chills and fever.  HENT: Negative for sore throat.   Eyes: Negative for blurred vision, double vision and pain.  Respiratory: Positive for cough, sputum production, shortness of breath and wheezing. Negative for hemoptysis.   Cardiovascular: Negative for chest pain, palpitations, orthopnea and leg swelling.  Gastrointestinal: Negative for abdominal pain, constipation, diarrhea, heartburn, nausea and vomiting.  Genitourinary: Negative for dysuria and hematuria.  Musculoskeletal: Negative for back pain and joint pain.  Skin: Negative for rash.  Neurological: Positive for weakness. Negative for sensory change, speech change, focal weakness and headaches.  Endo/Heme/Allergies: Does not bruise/bleed easily.  Psychiatric/Behavioral: Negative for depression. The patient is not nervous/anxious.    DRUG ALLERGIES:   Allergies  Allergen Reactions  . Amoxicillin-Pot Clavulanate Hives    Also vomiting  . Septra [Sulfamethoxazole-Trimethoprim] Hives and Nausea And Vomiting    VITALS:  Blood pressure (!) 149/85, pulse 98, temperature 97.9 F (36.6 C), temperature source Oral, resp. rate 20, height 5\' 8"  (1.727 m), weight 84.7 kg (186 lb 11.2 oz), SpO2 95 %.  PHYSICAL EXAMINATION:   Physical Exam  GENERAL:  56 y.o.-year-old patient lying in the bed EYES: Pupils equal, round, reactive to light and accommodation. No scleral icterus. Extraocular muscles intact.  HEENT: Head atraumatic, normocephalic. Oropharynx and nasopharynx clear.  NECK:  Supple, no jugular venous distention. No thyroid enlargement, no  tenderness.  LUNGS: Decreased air entry bilaterally CARDIOVASCULAR: S1, S2 normal. No murmurs, rubs, or gallops.  ABDOMEN: Soft, nontender, nondistended. Bowel sounds present. No organomegaly or mass.  EXTREMITIES: No cyanosis, clubbing or edema b/l.    NEUROLOGIC: Cranial nerves II through XII are intact. No focal Motor or sensory deficits b/l. PSYCHIATRIC: The patient is drowzy SKIN: No obvious rash, lesion, or ulcer.   LABORATORY PANEL:   CBC  Recent Labs Lab 11/23/16 0434  WBC 9.8  HGB 12.2  HCT 35.6  PLT 248   ------------------------------------------------------------------------------------------------------------------ Chemistries   Recent Labs Lab 11/22/16 0359 11/23/16 0434  NA 139 139  K 4.5 4.0  CL 100* 102  CO2 34* 33*  GLUCOSE 158* 143*  BUN 17 14  CREATININE 0.57 0.54  CALCIUM 8.5* 8.3*  AST 33  --   ALT 17  --   ALKPHOS 52  --   BILITOT 0.4  --    ------------------------------------------------------------------------------------------------------------------  Cardiac Enzymes  Recent Labs Lab 11/16/16 1240  TROPONINI <0.03   ------------------------------------------------------------------------------------------------------------------ RADIOLOGY:  Dg Chest Port 1 View  Result Date: 11/23/2016 CLINICAL DATA:  Respiratory failure EXAM: PORTABLE CHEST 1 VIEW COMPARISON:  November 21, 2016 chest radiograph; chest CT November 19, 2016 FINDINGS: There remains patchy airspace consolidation in the right upper lobe and medial aspect of the left upper lobe. There appears to be partial clearing of patchy infiltrate from much of the left upper lobe compared to recent studies. No new opacity evident elsewhere. Port-A-Cath tip is at the cavoatrial junction. No pneumothorax. Heart size and pulmonary vascularity are normal. No adenopathy. No bone lesions. IMPRESSION: Space opacity in both upper lobes, stable on the right but less prominent on the left consistent with  partial clearing. Patchy infiltrate remains in the medial aspect of the left upper lobe. No new opacity elsewhere. Stable cardiac silhouette. No pneumothorax. Electronically Signed   By: Lowella Grip III M.D.   On: 11/23/2016 07:15   Dg Chest Port 1 View  Result Date: 11/21/2016 CLINICAL DATA:  Shortness of breath and hypoxia EXAM: PORTABLE CHEST 1 VIEW COMPARISON:  11/19/2016 FINDINGS: Cardiac shadow is within normal limits. The lungs are well aerated bilaterally with stable upper lobe changes similar to that seen on recent CT examination. No sizable effusion is seen. Right chest wall port is noted. No bony abnormality is seen. IMPRESSION: Bilateral upper lobe airspace disease stable from recent CT. Electronically Signed   By: Inez Catalina M.D.   On: 11/21/2016 13:33   ASSESSMENT AND PLAN:   Pihu Basil  is a 56 y.o. female with a known history of COPD not on home oxygen, Bipolar disorder, borderline schizophrenia, hypertension, history of right lung cancer currently in remission presents to hospital secondary to worsening shortness of breath.  # Acute encephalopathy Likely due to hypoxia and acute illness Resolved Risperidone and Depakote medications dose reduced  # Acute respiratory failure with hypoxia-secondary to COPD exacerbation with acute bronchitis. - steroids, Antibiotics - Scheduled Nebulizers -Wean O2 as tolerated - CTA chest showed bilateral pneumonia - Appreciate pulmonary help. Discussed with Dr. Leonidas Romberg  # Bipolar disorder with anxiety  On Depakote. Risperidone  Dose estrogen use  # chronic sinus tachycardia On cardizem.  # history of lung cancer-diagnosed at [facility] in January 2017, last chemotherapy in April 2017. Follows up at the cancer center.  # DVT prophylaxis-Lovenox  All the records are reviewed and case discussed with Care Management/Social Workerr. Management plans discussed with the patient, family and they are in agreement.  CODE STATUS:  DNR  DVT Prophylaxis: SCDs  TOTAL TIME TAKING CARE OF THIS PATIENT: 35 minutes.   We'll have physical therapy reevaluate as recommendation was skilled nursing facility previously. The patient is much improved. Discharged tomorrow to home or SNF.  [name] R M.D on 11/23/2016 at 12:19 PM  Between 7am to 6pm - Pager - [phone]  After 6pm go to [email or web address] - password [email or web address] [facility]  Office  [phone]  CC: Primary care physician; Sison, Adele Dan, MD  Note: This dictation was prepared with Dragon dictation along with smaller phrase technology. Any transcriptional errors that result from this process are unintentional.

## 2016-11-23 NOTE — Plan of Care (Signed)
Problem: Respiratory: Goal: Levels of oxygenation will improve Outcome: Progressing Upon transfer to Room 117 from ICU 4 pt on Room Air with O2 sats 96%

## 2016-11-24 LAB — GLUCOSE, CAPILLARY
GLUCOSE-CAPILLARY: 98 mg/dL (ref 65–99)
Glucose-Capillary: 91 mg/dL (ref 65–99)

## 2016-11-24 MED ORDER — RISPERIDONE 1 MG PO TABS
1.0000 mg | ORAL_TABLET | Freq: Every day | ORAL | 0 refills | Status: AC
Start: 2016-11-24 — End: ?

## 2016-11-24 MED ORDER — LEVOFLOXACIN 500 MG PO TABS: 500.0000 mg | ORAL_TABLET | Freq: Every day | ORAL | 0 refills | Status: AC

## 2016-11-24 NOTE — Progress Notes (Signed)
Spoke with PT Corene Cornea cleared pt. To go home with outpt. PT if needed. Discharge instruction given to pt. With teach back. Port de accessed without difficulty

## 2016-11-24 NOTE — Evaluation (Signed)
Physical Therapy Re-Evaluation Patient Details Name: Charlene [name] MRN: [id number] DOB: 1960/12/27 Today's Date: 11/24/2016   History of Present Illness  56 y.o. female with a known history of COPD not on home oxygen, Bipolar disorder, borderline schizophrenia, hypertension, history of right lung cancer currently in remission presents to hospital secondary to worsening shortness of breath. She was initially evaluated by therapy who recommended SNF placement. She has since transferred to CCU and then back down to the floor. PT reconsulted as pt has improved and looking for possible update in DC recommendations if appropriate  Clinical Impression  Pt admitted with above diagnosis. Pt currently with functional limitations due to the deficits listed below (see PT Problem List).  Pt is significantly improved from initial PT evaluation. During re-evaluation on this date she is able to perform bed mobility and transfers independently. She is able to ambulate easily in the hallway with supervision only from therapist. Decreased step length but good stability with ambulation and when performing horizontal and vertical head turns. Unable to demonstrate significant change in gait speed with cues. No need for external assist for stability with ambulation. Denies DOE and no signs of respiratory distress. Pt has some higher level balance deficits in tandem and single leg stance. She could benefit from OP PT to work on her balance. Pt encouraged to use single point cane as needed for added stability. She is safe to return home when medically appropriate. Pt will benefit from skilled PT services to address deficits in strength, balance, and mobility in order to return to full function at home.     Follow Up Recommendations Outpatient PT;Other (comment) (for balance)    Equipment Recommendations  None recommended by PT;Other (comment) (Use cane prn)    Recommendations for Other Services       Precautions /  Restrictions Precautions Precautions: Fall Restrictions Weight Bearing Restrictions: No      Mobility  Bed Mobility Overal bed mobility: Independent             General bed mobility comments: Pt is standing up at sink upon arrival.   Transfers Overall transfer level: Independent               General transfer comment: Good speed and sequencing. Able to perform sit to stand without UE support. 5TSTS: 14.7s. No need for UE support for stabiltiy in standing  Ambulation/Gait Ambulation/Gait assistance: Supervision Ambulation Distance (Feet): 150 Feet Assistive device: None Gait Pattern/deviations: Decreased step length - right;Decreased step length - left Gait velocity: Decreased but functional for household mobility Gait velocity interpretation: Below normal speed for age/gender General Gait Details: Pt able to ambulate easily in the hallway with therapist. Decreased step length but good stability with ambulation and when performing horizontal and vertical head turns. Unable to demonstrate significant change in gait speed with cues. No need for external assist for stability with ambulation. Denies DOE and no signs of respiratory distress  Stairs            Wheelchair Mobility    Modified Rankin (Stroke Patients Only)       Balance Overall balance assessment: Needs assistance Sitting-balance support: No upper extremity supported Sitting balance-Leahy Scale: Good     Standing balance support: Bilateral upper extremity supported Standing balance-Leahy Scale: Fair Standing balance comment: Pt demonstrates good stability with feet apart/together. Increased sway but no LOB with Rhomberg testing. Single leg balance approximately 4 seconds. Unable to maintain tandem balance. 5TSTS: 14.7s. Pt reports history of multiple falls  7-8 months ago when she was sick with PNA                             Pertinent Vitals/Pain Pain Assessment: 0-10 Pain Score: 4   Pain Location: Middle/lower chronic back pain Pain Intervention(s): Monitored during session    Home Living Family/patient expects to be discharged to:: Private residence Living Arrangements: Parent Available Help at Discharge: Family Type of Home: House Home Access: Level entry     Home Layout: One level Home Equipment: Shower seat;Grab bars - tub/shower;Walker - 4 wheels      Prior Function Level of Independence: Independent         Comments: Pt reports no falls in the last 6 months. States she had PNA 7-8 months ago and was having some falls     Hand Dominance   Dominant Hand: Right    Extremity/Trunk Assessment   Upper Extremity Assessment Upper Extremity Assessment: Overall WFL for tasks assessed    Lower Extremity Assessment Lower Extremity Assessment: Overall WFL for tasks assessed (Reports some chronic bilateral LE neuropathy)       Communication   Communication: No difficulties  Cognition Arousal/Alertness: Awake/alert Behavior During Therapy: WFL for tasks assessed/performed Overall Cognitive Status: Within Functional Limits for tasks assessed                                        General Comments      Exercises     Assessment/Plan    PT Assessment Patient needs continued PT services  PT Problem List Decreased balance;Decreased mobility;Decreased knowledge of use of DME;Decreased safety awareness       PT Treatment Interventions DME instruction;Gait training;Stair training;Functional mobility training;Therapeutic activities;Therapeutic exercise;Balance training;Patient/family education;Neuromuscular re-education    PT Goals (Current goals can be found in the Care Plan section)  Acute Rehab PT Goals Patient Stated Goal: go home PT Goal Formulation: With patient Time For Goal Achievement: 12/04/16 Potential to Achieve Goals: Fair    Frequency Min 2X/week   Barriers to discharge        Co-evaluation                AM-PAC PT "6 Clicks" Daily Activity  Outcome Measure Difficulty turning over in bed (including adjusting bedclothes, sheets and blankets)?: None Difficulty moving from lying on back to sitting on the side of the bed? : None Difficulty sitting down on and standing up from a chair with arms (e.g., wheelchair, bedside commode, etc,.)?: None Help needed moving to and from a bed to chair (including a wheelchair)?: None Help needed walking in hospital room?: None Help needed climbing 3-5 steps with a railing? : A Little 6 Click Score: 23    End of Session Equipment Utilized During Treatment: Gait belt Activity Tolerance: Patient tolerated treatment well Patient left: with call bell/phone within reach;in bed;Other (comment) (sitting at EOB) Nurse Communication: Mobility status;Other (comment) (DC recommendations) PT Visit Diagnosis: Difficulty in walking, not elsewhere classified (R26.2);History of falling (Z91.81)    Time: 5176-1607 PT Time Calculation (min) (ACUTE ONLY): 14 min   Charges:   PT Evaluation $PT Re-evaluation: 1 Procedure     PT G Codes:   PT G-Codes **NOT FOR INPATIENT CLASS** Functional Assessment Tool Used: AM-PAC 6 Clicks Basic Mobility Functional Limitation: Mobility: Walking and moving around Mobility: Walking and Moving Around Current Status (  G8978): At least 1 percent but less than 20 percent impaired, limited or restricted Mobility: Walking and Moving Around Goal Status [id number]): At least 1 percent but less than 20 percent impaired, limited or restricted    Phillips Grout PT, DPT    Esmay Amspacher 11/24/2016, 12:49 PM

## 2016-11-24 NOTE — Discharge Summary (Signed)
[facility] NAME: Charlene [name]    MR#:  [id number]  DATE OF BIRTH:  1960-08-15  DATE OF ADMISSION:  11/16/2016 ADMITTING PHYSICIAN: Gladstone Lighter, MD  DATE OF DISCHARGE: 11/24/2016  PRIMARY CARE PHYSICIAN: Sison, Adele Dan, MD    ADMISSION DIAGNOSIS:  COPD exacerbation ([place]) [J44.1]  DISCHARGE DIAGNOSIS:  Active Problems:   COPD exacerbation ([place])   Respiratory failure ([place])   Lethargy   SECONDARY DIAGNOSIS:   Past Medical History:  Diagnosis Date  . Asthma   . Bipolar 2 disorder ([place])   . Borderline schizophrenia   . Falls infrequently   . Hypertension   . Primary cancer of right lower lobe of lung ([place]) 07/12/2015   Small cell undifferentiated carcinoma of lung.  Diagnosis at [facility] by fine-needle aspiration of lymph node (January, 2017)  . Ulcer     HOSPITAL COURSE:   56 year old female with bipolar and essential hypertension who presented with acute hypoxic respiratory failure secondary to COPD exacerbation and pneumonia as well as acute encephalopathy.  1. Acute encephalopathy due to initial hypoxia and acute illness. Patient's mental status is back to baseline. Risperdal and Depakote medications dosages have been reduced.  2. Acute hypoxic respiratory failure secondary to COPD exacerbation with pneumonia (bilateral) Patient was evaluated by pulmonary. She was on steroids and Levaquin. At discharge patient's lungs are clear to all station. She will need Levaquin for 3 more days for treatment of pneumonia.  3. Bipolar disorder with anxiety: Patient will continue on her outpatient medications. Depakote and Risperdal medications as mentioned above the dosages have been reduced.  4. Chronic sinus tachycardia: Patient will continue on Cardizem   5. History of lung cancer diagnosed at [facility]: Patient follows up at [facility].   DISCHARGE CONDITIONS AND DIET:  Stable for discharge and regular diet   CONSULTS OBTAINED:  Treatment Team:  Allyne Gee, MD  DRUG ALLERGIES:   Allergies  Allergen Reactions  . Amoxicillin-Pot Clavulanate Hives    Also vomiting  . Septra [Sulfamethoxazole-Trimethoprim] Hives and Nausea And Vomiting    DISCHARGE MEDICATIONS:   Current Discharge Medication List    START taking these medications   Details  levofloxacin (LEVAQUIN) 500 MG tablet Take 1 tablet (500 mg total) by mouth daily. Qty: 3 tablet, Refills: 0      CONTINUE these medications which have CHANGED   Details  risperiDONE (RISPERDAL) 1 MG tablet Take 1 tablet (1 mg total) by mouth at bedtime. Qty: 30 tablet, Refills: 0      CONTINUE these medications which have NOT CHANGED   Details  acetaminophen (TYLENOL) 500 MG tablet Take 500 mg by mouth every 6 (six) hours as needed.    albuterol (PROVENTIL HFA;VENTOLIN HFA) 108 (90 Base) MCG/ACT inhaler Inhale 2 puffs into the lungs every 4 (four) hours as needed for wheezing or shortness of breath.    cyclobenzaprine (FLEXERIL) 10 MG tablet Take 10 mg by mouth 2 (two) times daily.    Associated Diagnoses: Small cell lung cancer, unspecified laterality (HCC)    diltiazem (CARDIZEM CD) 120 MG 24 hr capsule Take 1 capsule (120 mg total) by mouth daily. Qty: 30 capsule, Refills: 0    divalproex (DEPAKOTE) 500 MG DR tablet Take 2 tablets (1,000 mg total) by mouth every 12 (twelve) hours. Qty: 60 tablet, Refills: 0    fluticasone (FLONASE) 50 MCG/ACT nasal spray 1 spray by Each Nare route daily.   Associated Diagnoses: Small  cell lung cancer, unspecified laterality (HCC)    loratadine (CLARITIN) 10 MG tablet Take 10 mg by mouth daily as needed.    Associated Diagnoses: Small cell lung cancer, unspecified laterality (HCC)    Multiple Vitamin (MULTIVITAMIN) tablet Take 1 tablet by mouth daily.    omeprazole (PRILOSEC) 20 MG capsule Take 20 mg by mouth daily.    tiotropium (SPIRIVA HANDIHALER) 18 MCG inhalation capsule Place 18 mcg into  inhaler and inhale daily.    vitamin C (ASCORBIC ACID) 500 MG tablet Take 500 mg by mouth daily.    sucralfate (CARAFATE) 1 g tablet Take 1 tablet (1 g total) by mouth 4 (four) times daily. Dissolve in 2-3 tbsp warm water, swish and swallow. Qty: 120 tablet, Refills: 3   Associated Diagnoses: Primary cancer of right lower lobe of lung ([place])      STOP taking these medications     guaiFENesin-codeine (ROBITUSSIN AC) 100-10 MG/5ML syrup      Saline (AYR SALINE NASAL DROPS) 0.65 % (Soln) SOLN           Today   CHIEF COMPLAINT:  Patient doing well this morning. No acute issues overnight. No shortness of breath or wheezing   VITAL SIGNS:  Blood pressure 103/67, pulse (!) 106, temperature 98.1 F (36.7 C), temperature source Oral, resp. rate 14, height 5\' 8"  (1.727 m), weight 84.7 kg (186 lb 11.2 oz), SpO2 95 %.   REVIEW OF SYSTEMS:  Review of Systems  Constitutional: Negative.  Negative for chills, fever and malaise/fatigue.  HENT: Negative.  Negative for ear discharge, ear pain, hearing loss, nosebleeds and sore throat.   Eyes: Negative.  Negative for blurred vision and pain.  Respiratory: Negative.  Negative for cough, hemoptysis, shortness of breath and wheezing.   Cardiovascular: Negative.  Negative for chest pain, palpitations and leg swelling.  Gastrointestinal: Negative.  Negative for abdominal pain, blood in stool, diarrhea, nausea and vomiting.  Genitourinary: Negative.  Negative for dysuria.  Musculoskeletal: Negative.  Negative for back pain.  Skin: Negative.   Neurological: Negative for dizziness, tremors, speech change, focal weakness, seizures and headaches.  Endo/Heme/Allergies: Negative.  Does not bruise/bleed easily.  Psychiatric/Behavioral: Negative.  Negative for depression, hallucinations and suicidal ideas.     PHYSICAL EXAMINATION:  GENERAL:  56 y.o.-year-old patient lying in the bed with no acute distress.  NECK:  Supple, no jugular venous  distention. No thyroid enlargement, no tenderness.  LUNGS: Normal breath sounds bilaterally, no wheezing, rales,rhonchi  No use of accessory muscles of respiration.  CARDIOVASCULAR: S1, S2 normal. No murmurs, rubs, or gallops.  ABDOMEN: Soft, non-tender, non-distended. Bowel sounds present. No organomegaly or mass.  EXTREMITIES: No pedal edema, cyanosis, or clubbing.  PSYCHIATRIC: The patient is alert and oriented x 3.  SKIN: No obvious rash, lesion, or ulcer.   DATA REVIEW:   CBC  Recent Labs Lab 11/23/16 0434  WBC 9.8  HGB 12.2  HCT 35.6  PLT 248    Chemistries   Recent Labs Lab 11/22/16 0359 11/23/16 0434  NA 139 139  K 4.5 4.0  CL 100* 102  CO2 34* 33*  GLUCOSE 158* 143*  BUN 17 14  CREATININE 0.57 0.54  CALCIUM 8.5* 8.3*  AST 33  --   ALT 17  --   ALKPHOS 52  --   BILITOT 0.4  --     Cardiac Enzymes No results for input(s): TROPONINI in the last 168 hours.  Microbiology Results  @MICRORSLT48 @  RADIOLOGY:  Dg Chest  Port 1 View  Result Date: 11/23/2016 CLINICAL DATA:  Respiratory failure EXAM: PORTABLE CHEST 1 VIEW COMPARISON:  November 21, 2016 chest radiograph; chest CT November 19, 2016 FINDINGS: There remains patchy airspace consolidation in the right upper lobe and medial aspect of the left upper lobe. There appears to be partial clearing of patchy infiltrate from much of the left upper lobe compared to recent studies. No new opacity evident elsewhere. Port-A-Cath tip is at the cavoatrial junction. No pneumothorax. Heart size and pulmonary vascularity are normal. No adenopathy. No bone lesions. IMPRESSION: Space opacity in both upper lobes, stable on the right but less prominent on the left consistent with partial clearing. Patchy infiltrate remains in the medial aspect of the left upper lobe. No new opacity elsewhere. Stable cardiac silhouette. No pneumothorax. Electronically Signed   By: Lowella Grip III M.D.   On: 11/23/2016 07:15      Current Discharge  Medication List    START taking these medications   Details  levofloxacin (LEVAQUIN) 500 MG tablet Take 1 tablet (500 mg total) by mouth daily. Qty: 3 tablet, Refills: 0      CONTINUE these medications which have CHANGED   Details  risperiDONE (RISPERDAL) 1 MG tablet Take 1 tablet (1 mg total) by mouth at bedtime. Qty: 30 tablet, Refills: 0      CONTINUE these medications which have NOT CHANGED   Details  acetaminophen (TYLENOL) 500 MG tablet Take 500 mg by mouth every 6 (six) hours as needed.    albuterol (PROVENTIL HFA;VENTOLIN HFA) 108 (90 Base) MCG/ACT inhaler Inhale 2 puffs into the lungs every 4 (four) hours as needed for wheezing or shortness of breath.    cyclobenzaprine (FLEXERIL) 10 MG tablet Take 10 mg by mouth 2 (two) times daily.    Associated Diagnoses: Small cell lung cancer, unspecified laterality (HCC)    diltiazem (CARDIZEM CD) 120 MG 24 hr capsule Take 1 capsule (120 mg total) by mouth daily. Qty: 30 capsule, Refills: 0    divalproex (DEPAKOTE) 500 MG DR tablet Take 2 tablets (1,000 mg total) by mouth every 12 (twelve) hours. Qty: 60 tablet, Refills: 0    fluticasone (FLONASE) 50 MCG/ACT nasal spray 1 spray by Each Nare route daily.   Associated Diagnoses: Small cell lung cancer, unspecified laterality (HCC)    loratadine (CLARITIN) 10 MG tablet Take 10 mg by mouth daily as needed.    Associated Diagnoses: Small cell lung cancer, unspecified laterality (HCC)    Multiple Vitamin (MULTIVITAMIN) tablet Take 1 tablet by mouth daily.    omeprazole (PRILOSEC) 20 MG capsule Take 20 mg by mouth daily.    tiotropium (SPIRIVA HANDIHALER) 18 MCG inhalation capsule Place 18 mcg into inhaler and inhale daily.    vitamin C (ASCORBIC ACID) 500 MG tablet Take 500 mg by mouth daily.    sucralfate (CARAFATE) 1 g tablet Take 1 tablet (1 g total) by mouth 4 (four) times daily. Dissolve in 2-3 tbsp warm water, swish and swallow. Qty: 120 tablet, Refills: 3   Associated  Diagnoses: Primary cancer of right lower lobe of lung ([place])      STOP taking these medications     guaiFENesin-codeine (ROBITUSSIN AC) 100-10 MG/5ML syrup      Saline (AYR SALINE NASAL DROPS) 0.65 % (Soln) SOLN           Management plans discussed with the patient and she is in agreement. Stable for discharge   Patient should follow up with pcp  CODE STATUS:  Code Status Orders        Start     Ordered   11/19/16 1055  Do not attempt resuscitation (DNR)  Continuous    Question Answer Comment  In the event of cardiac or respiratory ARREST Do not call a "code blue"   In the event of cardiac or respiratory ARREST Do not perform Intubation, CPR, defibrillation or ACLS   In the event of cardiac or respiratory ARREST Use medication by any route, position, wound care, and other measures to relive pain and suffering. May use oxygen, suction and manual treatment of airway obstruction as needed for comfort.      11/19/16 1054    Code Status History    Date Active Date Inactive Code Status Order ID Comments User Context   11/16/2016  4:12 PM 11/19/2016 10:54 AM Full Code [id number]  Gladstone Lighter, MD Inpatient   12/31/2015 12:20 AM 01/11/2016  2:11 PM Full Code [id number]  Lance Coon, MD Inpatient      TOTAL TIME TAKING CARE OF THIS PATIENT: 37 minutes.    Note: This dictation was prepared with Dragon dictation along with smaller phrase technology. Any transcriptional errors that result from this process are unintentional.  Gustavus Haskin M.D on 11/24/2016 at 8:44 AM  Between 7am to 6pm - Pager - [phone] After 6pm go to [email or web address] - password [email or web address] [facility]  Office  [phone]  CC: Primary care physician; Sison, Adele Dan, MD

## 2016-11-24 NOTE — Progress Notes (Signed)
Pt. Discharge home with family belongings sent with pt.

## 2016-11-24 NOTE — Care Management (Signed)
Physical therapy evaluation completed. Recommending outpatient therapy. Charlene [name] is in agreement with this plan. Will fax signed referral to Rehabilitation Services.  Charlene [name] sees Charlene [name] in [facility]. Contact telephone number is [phone] for Charlene [name]. Discharge to home today per Charlene [name]. Charlene Ammons RN MSN CCM Care Management [phone]

## 2016-11-24 NOTE — Clinical Social Work Note (Signed)
Pt is ready for discharge today. CSW following for discharge planning to SNF. CSW spoke with PT, and recommendation is for pt to return home. RNCM will follow for discharge planning needs. CSW is signing off as no further needs identified.   Darden Dates, MSW, LCSW  Clinical Social Worker  [phone]

## 2016-12-10 ENCOUNTER — Ambulatory Visit
Admission: RE | Admit: 2016-12-10 | Discharge: 2016-12-10 | Disposition: A | Discharge status: Home / Self Care | Payer: Medicaid Other | Source: Ambulatory Visit | Attending: Radiation Oncology | Admitting: Radiation Oncology

## 2016-12-10 VITALS — BP 117/80 | HR 104 | Temp 97.8°F | Wt 186.7 lb

## 2016-12-10 DIAGNOSIS — Z923 Personal history of irradiation: Secondary | ICD-10-CM | POA: Insufficient documentation

## 2016-12-10 DIAGNOSIS — C3431 Malignant neoplasm of lower lobe, right bronchus or lung: Secondary | ICD-10-CM | POA: Insufficient documentation

## 2016-12-10 DIAGNOSIS — Z8701 Personal history of pneumonia (recurrent): Secondary | ICD-10-CM | POA: Diagnosis not present

## 2016-12-10 DIAGNOSIS — F17210 Nicotine dependence, cigarettes, uncomplicated: Secondary | ICD-10-CM | POA: Diagnosis not present

## 2016-12-10 DIAGNOSIS — Z85118 Personal history of other malignant neoplasm of bronchus and lung: Secondary | ICD-10-CM | POA: Diagnosis present

## 2016-12-10 NOTE — Progress Notes (Signed)
Radiation Oncology Follow up Note  Name: Charlene [name]   Date:   12/10/2016 MRN:  [id number] DOB: 1960/09/05    This 56 y.o. 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 She is seen today in routine follow-up and is doing well recently had a hospitalization for pneumonia which is cleared. She specifically denies any change in neurologic status cough hemoptysis chest tightness or dysphagia. Recently had a CT scan while hospitalized. Shows some upper lower lobe airspace disease no evidence of residual carcinoma.  COMPLICATIONS OF TREATMENT: none  FOLLOW UP COMPLIANCE: keeps appointments   PHYSICAL EXAM:  BP 117/80   Pulse (!) 104   Temp 97.8 F (36.6 C) (Tympanic)   Wt 186 lb 11.7 oz (84.7 kg)   BMI 28.39 kg/m  Well-developed well-nourished patient in NAD. HEENT reveals PERLA, EOMI, discs not visualized.  Oral cavity is clear. No oral mucosal lesions are identified. Neck is clear without evidence of cervical or supraclavicular adenopathy. Lungs are clear to A&P. Cardiac examination is essentially unremarkable with regular rate and rhythm without murmur rub or thrill. Abdomen is benign with no organomegaly or masses noted. Motor sensory and DTR levels are equal and symmetric in the upper and lower extremities. Cranial nerves II through XII are grossly intact. Proprioception is intact. No peripheral adenopathy or edema is identified. No motor or sensory levels are noted. Crude visual fields are within normal range.  RADIOLOGY RESULTS: CT scans are reviewed and compatible with the above-stated findings  PLAN: At the present time patient is doing well with no evidence of  disease I'm please were overall progress. I have asked to see her back in 1 year for follow-up. Patient knows to call sooner with any concerns. She continues close follow-up care by medical oncology.  I would like to take this opportunity to thank you for allowing me to participate in the care of your patient.Armstead Peaks., MD

## 2016-12-10 NOTE — Progress Notes (Signed)
Patient had hospitalization earlier this month with pneumonia.

## 2016-12-23 ENCOUNTER — Ambulatory Visit
Admission: RE | Admit: 2016-12-23 | Discharge: 2016-12-23 | Disposition: A | Discharge status: Home / Self Care | Payer: Medicaid Other | Source: Ambulatory Visit | Attending: Internal Medicine | Admitting: Internal Medicine

## 2016-12-23 DIAGNOSIS — J701 Chronic and other pulmonary manifestations due to radiation: Secondary | ICD-10-CM | POA: Diagnosis not present

## 2016-12-23 DIAGNOSIS — J439 Emphysema, unspecified: Secondary | ICD-10-CM | POA: Insufficient documentation

## 2016-12-23 DIAGNOSIS — R918 Other nonspecific abnormal finding of lung field: Secondary | ICD-10-CM | POA: Diagnosis not present

## 2016-12-23 DIAGNOSIS — C3431 Malignant neoplasm of lower lobe, right bronchus or lung: Secondary | ICD-10-CM | POA: Diagnosis not present

## 2016-12-23 MED ORDER — IOPAMIDOL (ISOVUE-300) INJECTION 61%
75.0000 mL | Freq: Once | INTRAVENOUS | Status: AC | PRN
  Administered 2016-12-23: 75 mL via INTRAVENOUS

## 2016-12-26 ENCOUNTER — Inpatient Hospital Stay: Payer: Medicaid Other | Attending: Internal Medicine | Admitting: Internal Medicine

## 2016-12-26 ENCOUNTER — Inpatient Hospital Stay: Payer: Medicaid Other

## 2016-12-26 ENCOUNTER — Other Ambulatory Visit: Payer: Self-pay | Admitting: *Deleted

## 2016-12-26 VITALS — BP 107/76 | HR 98 | Temp 96.7°F | Resp 18 | Wt 184.8 lb

## 2016-12-26 DIAGNOSIS — Z95828 Presence of other vascular implants and grafts: Secondary | ICD-10-CM

## 2016-12-26 DIAGNOSIS — C3431 Malignant neoplasm of lower lobe, right bronchus or lung: Secondary | ICD-10-CM

## 2016-12-26 DIAGNOSIS — J449 Chronic obstructive pulmonary disease, unspecified: Secondary | ICD-10-CM | POA: Insufficient documentation

## 2016-12-26 DIAGNOSIS — Z79899 Other long term (current) drug therapy: Secondary | ICD-10-CM | POA: Insufficient documentation

## 2016-12-26 DIAGNOSIS — R531 Weakness: Secondary | ICD-10-CM | POA: Insufficient documentation

## 2016-12-26 DIAGNOSIS — Z8701 Personal history of pneumonia (recurrent): Secondary | ICD-10-CM | POA: Diagnosis not present

## 2016-12-26 DIAGNOSIS — Z85118 Personal history of other malignant neoplasm of bronchus and lung: Secondary | ICD-10-CM | POA: Insufficient documentation

## 2016-12-26 DIAGNOSIS — J209 Acute bronchitis, unspecified: Secondary | ICD-10-CM | POA: Insufficient documentation

## 2016-12-26 DIAGNOSIS — Z9221 Personal history of antineoplastic chemotherapy: Secondary | ICD-10-CM

## 2016-12-26 DIAGNOSIS — Z923 Personal history of irradiation: Secondary | ICD-10-CM | POA: Insufficient documentation

## 2016-12-26 LAB — CBC WITH DIFFERENTIAL/PLATELET
BASOS ABS: 0 10*3/uL (ref 0–0.1)
BASOS PCT: 0 %
EOS ABS: 0.1 10*3/uL (ref 0–0.7)
Eosinophils Relative: 1 %
HCT: 34.1 % — ABNORMAL LOW (ref 35.0–47.0)
Hemoglobin: 11.7 g/dL — ABNORMAL LOW (ref 12.0–16.0)
Lymphocytes Relative: 16 %
Lymphs Abs: 1.2 10*3/uL (ref 1.0–3.6)
MCH: 32.4 pg (ref 26.0–34.0)
MCHC: 34.3 g/dL (ref 32.0–36.0)
MCV: 94.4 fL (ref 80.0–100.0)
Monocytes Absolute: 0.6 10*3/uL (ref 0.2–0.9)
Monocytes Relative: 8 %
Neutro Abs: 6 10*3/uL (ref 1.4–6.5)
Neutrophils Relative %: 75 %
PLATELETS: 200 10*3/uL (ref 150–440)
RBC: 3.61 MIL/uL — ABNORMAL LOW (ref 3.80–5.20)
RDW: 15.6 % — ABNORMAL HIGH (ref 11.5–14.5)
WBC: 7.9 10*3/uL (ref 3.6–11.0)

## 2016-12-26 LAB — COMPREHENSIVE METABOLIC PANEL
ALT: 7 U/L — AB (ref 14–54)
AST: 22 U/L (ref 15–41)
Albumin: 3 g/dL — ABNORMAL LOW (ref 3.5–5.0)
Alkaline Phosphatase: 56 U/L (ref 38–126)
Anion gap: 7 (ref 5–15)
BUN: 9 mg/dL (ref 6–20)
CO2: 26 mmol/L (ref 22–32)
Calcium: 8.5 mg/dL — ABNORMAL LOW (ref 8.9–10.3)
Chloride: 105 mmol/L (ref 101–111)
Creatinine, Ser: 0.72 mg/dL (ref 0.44–1.00)
GFR calc Af Amer: >60 mL/min (ref >60)
GFR calc non Af Amer: >60 mL/min (ref >60)
Glucose, Bld: 117 mg/dL — ABNORMAL HIGH (ref 65–99)
POTASSIUM: 3.8 mmol/L (ref 3.5–5.1)
SODIUM: 138 mmol/L (ref 135–145)
Total Bilirubin: 0.4 mg/dL (ref 0.3–1.2)
Total Protein: 5.9 g/dL — ABNORMAL LOW (ref 6.5–8.1)

## 2016-12-26 MED ORDER — IPRATROPIUM-ALBUTEROL 0.5-2.5 (3) MG/3ML IN SOLN: 3.0000 mL | RESPIRATORY_TRACT | 3 refills | Status: DC | PRN

## 2016-12-26 MED ORDER — FLUTICASONE-SALMETEROL 500-50 MCG/DOSE IN AEPB: 1.0000 | INHALATION_SPRAY | Freq: Two times a day (BID) | RESPIRATORY_TRACT | 3 refills | Status: DC

## 2016-12-26 MED ORDER — CEPHALEXIN 500 MG PO CAPS: 500.0000 mg | ORAL_CAPSULE | Freq: Three times a day (TID) | ORAL | 0 refills | Status: DC

## 2016-12-26 MED ORDER — SODIUM CHLORIDE 0.9% FLUSH
10.0000 mL | INTRAVENOUS | Status: DC | PRN
  Administered 2016-12-26: 10 mL via INTRAVENOUS
  Filled 2016-12-26: qty 10

## 2016-12-26 MED ORDER — PREDNISONE 20 MG PO TABS: ORAL_TABLET | ORAL | 0 refills | Status: DC

## 2016-12-26 MED ORDER — HEPARIN SOD (PORK) LOCK FLUSH 100 UNIT/ML IV SOLN
500.0000 [IU] | Freq: Once | INTRAVENOUS | Status: AC
  Administered 2016-12-26: 500 [IU] via INTRAVENOUS

## 2016-12-26 NOTE — Assessment & Plan Note (Addendum)
Limited stage small cell lung cancer- s/p chemo-RT. finished April 2017 good response. Clinically no evidence of recurrence.  CT 12th JULY 2018- CT  chest- NED; RLL- STABLE scarring from RT.   # s/p WBRT PCI-  gen weakness/hearing loss- stable.   # cough- acute Bronchiti/COPD- CT chest- [place]- prednisone/Recommend nebulizer/ combivent treatments/ Advair.     # follow up in 6 months/CT scan prior; port flush; labs; 8 weeks- port flush; will call if symptoms.   # I reviewed the blood work- with the patient in detail; also reviewed the imaging independently [as summarized above]; and with the patient in detail.

## 2016-12-26 NOTE — Progress Notes (Signed)
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  Needle aspiration of lymph node was positive for small cell carcinoma of lung. Clinically  Staged  As  T1 N2 M0 tumor.    2.PET scan shows localized disease.MRI of brain is negative for any metastases  3.  Chemotherapy with cis-platinum and VP-16 has been started on July 18, 2015 4.starting radiation therapy to the chest from August 23 2015 .  5.Last chemotherapy (fourth cycle) October 03, 2015 patient has finished radiation therapy.  # AUG 2017- CT C/A/P- NED- stable RML- scarring.      Primary cancer of right lower lobe of lung ([place])   07/12/2015 Initial Diagnosis    Primary cancer of right lower lobe of lung [facility] [address])       INTERVAL HISTORY:  56 year old female patient with above history of limited stage small cell lung cancer currently status post chemoradiation fusion April 2017 is here for follow-up/ Review the results of her CT scan.  Patient was recently admitted to the hospital at [facility] for COPD exacerbation and pneumonia and also mental status changes. Mental status changes were thought to be from her antipsychotic medications.   Patient complains of worsening cough. She also complains of phlegm no hemoptysis. Worsening shortness of breath on exertion just in the last few days. She noted to be wheezing.  No fever no chills. Denies any  unusual headaches.   Complains of chronic fatigue. Chronic gait instability. No falls. She complains of generalized weakness. She also complains of difficulty hearing on the right side.  REVIEW OF SYSTEMS:   A complete 10 point review of system is done which is negative for mentioned above in history of present illness  I have reviewed  the past medical history, past surgical history, social history and family history with the patient and they are unchanged from previous note.  ALLERGIES:  is allergic to amoxicillin-pot clavulanate and septra [sulfamethoxazole-trimethoprim].  MEDICATIONS:  Current Outpatient Prescriptions  Medication Sig Dispense Refill  . acetaminophen (TYLENOL) 500 MG tablet Take 500 mg by mouth every 6 (six) hours as needed.    Marland Kitchen albuterol (PROVENTIL HFA;VENTOLIN HFA) 108 (90 Base) MCG/ACT inhaler Inhale 2 puffs into the lungs every 4 (four) hours as needed for wheezing or shortness of breath.    . cyclobenzaprine (FLEXERIL) 10 MG tablet Take 10 mg by mouth 2 (two) times daily.     Marland Kitchen diltiazem (CARDIZEM CD) 120 MG 24 hr capsule Take 1 capsule (120 mg total) by mouth daily. 30 capsule 0  . divalproex (DEPAKOTE) 500 MG DR tablet Take 2 tablets (1,000 mg total) by mouth every 12 (twelve) hours. 60 tablet 0  . fluticasone (FLONASE) 50 MCG/ACT nasal spray 1 spray by Each Nare route daily.    Marland Kitchen loratadine (CLARITIN) 10 MG tablet Take 10 mg by mouth daily as needed.     . Multiple Vitamin (MULTIVITAMIN) tablet Take 1 tablet by mouth daily.    Marland Kitchen omeprazole (PRILOSEC) 20 MG capsule Take 20 mg by mouth daily.    . risperiDONE (RISPERDAL) 1 MG tablet Take 1 tablet (1 mg total) by mouth at bedtime. 30 tablet 0  . sucralfate (CARAFATE) 1 g tablet Take 1 tablet (1 g total) by mouth 4 (  four) times daily. Dissolve in 2-3 tbsp warm water, swish and swallow. 120 tablet 3  . tiotropium (SPIRIVA HANDIHALER) 18 MCG inhalation capsule Place 18 mcg into inhaler and inhale daily.    . vitamin C (ASCORBIC ACID) 500 MG tablet Take 500 mg by mouth daily.    . cephALEXin (KEFLEX) 500 MG capsule Take 1 capsule (500 mg total) by mouth 3 (three) times daily. 21 capsule 0  . Fluticasone-Salmeterol (ADVAIR DISKUS) 500-50 MCG/DOSE AEPB Inhale 1 puff into the lungs 2 (two) times daily. 1 each 3  . ipratropium-albuterol (DUONEB) 0.5-2.5 (3)  MG/3ML SOLN Take 3 mLs by nebulization every 4 (four) hours as needed. 360 mL 3  . predniSONE (DELTASONE) 20 MG tablet 2 pills once a day X 7 days; and then one pill once a day x7 days. 21 tablet 0   No current facility-administered medications for this visit.     PHYSICAL EXAMINATION: ECOG PERFORMANCE STATUS: 1 - Symptomatic but completely ambulatory  Vitals:   12/26/16 1426  BP: 107/76  Pulse: 98  Resp: 18  Temp: (!) 96.7 F (35.9 C)   Filed Weights   12/26/16 1426  Weight: 184 lb 12.8 oz (83.8 kg)    GENERAL:alert, no distress and comfortable; She is alone.  She is walking herself. SKIN: skin color, texture, turgor are normal, no rashes or significant lesions EYES: normal, Conjunctiva are pink and non-injected, sclera clear OROPHARYNX:no exudate, no erythema and lips, buccal mucosa, and tongue normal  NECK: supple, thyroid normal size, non-tender, without nodularity LYMPH:  no palpable lymphadenopathy in the cervical, axillary or inguinal LUNGS:  Decreased breath sounds to auscultation; bilateral coarse breath sounds/ wheezing.  HEART: regular rate & rhythm and no murmurs and no lower extremity edema ABDOMEN:abdomen soft, non-tender and normal bowel sounds Musculoskeletal:no cyanosis of digits and no clubbing  NEURO: alert & oriented x 3 with fluent speech, no focal motor/sensory deficits  LABORATORY DATA:  I have reviewed the data as listed    Component Value Date/Time   NA 138 12/26/2016 1403   K 3.8 12/26/2016 1403   CL 105 12/26/2016 1403   CO2 26 12/26/2016 1403   GLUCOSE 117 (H) 12/26/2016 1403   BUN 9 12/26/2016 1403   CREATININE 0.72 12/26/2016 1403   CALCIUM 8.5 (L) 12/26/2016 1403   PROT 5.9 (L) 12/26/2016 1403   ALBUMIN 3.0 (L) 12/26/2016 1403   AST 22 12/26/2016 1403   ALT 7 (L) 12/26/2016 1403   ALKPHOS 56 12/26/2016 1403   BILITOT 0.4 12/26/2016 1403   GFRNONAA >60 12/26/2016 1403   GFRAA >60 12/26/2016 1403    No results found for: SPEP,  UPEP  Lab Results  Component Value Date   WBC 7.9 12/26/2016   NEUTROABS 6.0 12/26/2016   HGB 11.7 (L) 12/26/2016   HCT 34.1 (L) 12/26/2016   MCV 94.4 12/26/2016   PLT 200 12/26/2016      Chemistry      Component Value Date/Time   NA 138 12/26/2016 1403   K 3.8 12/26/2016 1403   CL 105 12/26/2016 1403   CO2 26 12/26/2016 1403   BUN 9 12/26/2016 1403   CREATININE 0.72 12/26/2016 1403      Component Value Date/Time   CALCIUM 8.5 (L) 12/26/2016 1403   ALKPHOS 56 12/26/2016 1403   AST 22 12/26/2016 1403   ALT 7 (L) 12/26/2016 1403   BILITOT 0.4 12/26/2016 1403       RADIOGRAPHIC STUDIES: I have personally reviewed the radiological images  as listed and agreed with the findings in the report. No results found.   ASSESSMENT & PLAN:  Primary cancer of right lower lobe of lung (HCC) Limited stage small cell lung cancer- s/p chemo-RT. finished April 2017 good response. Clinically no evidence of recurrence.  CT 12th JULY 2018- CT  chest- NED; RLL- STABLE scarring from RT.   # s/p WBRT PCI-  gen weakness/hearing loss- stable.   # cough- acute Bronchiti/COPD- CT chest- [place]- prednisone/Recommend nebulizer/ combivent treatments/ Advair.     # follow up in 6 months/CT scan prior; port flush; labs; 8 weeks- port flush; will call if symptoms.   # I reviewed the blood work- with the patient in detail; also reviewed the imaging independently [as summarized above]; and with the patient in detail.    Orders Placed This Encounter  Procedures  . CT CHEST W CONTRAST    Standing Status:   Future    Standing Expiration Date:   02/25/2018    Order Specific Question:   Reason for Exam (SYMPTOM  OR DIAGNOSIS REQUIRED)    Answer:   lung cancer    Order Specific Question:   Is the patient pregnant?    Answer:   No    Order Specific Question:   Preferred imaging location?    Answer:   [facility]  . CBC with Differential    Standing Status:   Future    Standing Expiration  Date:   12/26/2017  . Comprehensive metabolic panel    Standing Status:   Future    Standing Expiration Date:   12/26/2017      Cammie Sickle, MD 12/26/2016 3:05 PM

## 2016-12-26 NOTE — Progress Notes (Signed)
Patient is here for follow up, she mentions she has a cough, she is coughing up green mucus. Would like some cough medication.

## 2016-12-29 ENCOUNTER — Encounter: Payer: Self-pay | Admitting: *Deleted

## 2017-02-06 ENCOUNTER — Inpatient Hospital Stay: Payer: Medicaid Other | Attending: Internal Medicine

## 2017-02-06 DIAGNOSIS — C3431 Malignant neoplasm of lower lobe, right bronchus or lung: Secondary | ICD-10-CM | POA: Diagnosis not present

## 2017-02-06 DIAGNOSIS — Z95828 Presence of other vascular implants and grafts: Secondary | ICD-10-CM

## 2017-02-06 DIAGNOSIS — Z452 Encounter for adjustment and management of vascular access device: Secondary | ICD-10-CM | POA: Diagnosis not present

## 2017-02-06 MED ORDER — SODIUM CHLORIDE 0.9% FLUSH
10.0000 mL | INTRAVENOUS | Status: DC | PRN
  Administered 2017-02-06: 10 mL via INTRAVENOUS
  Filled 2017-02-06: qty 10

## 2017-02-06 MED ORDER — HEPARIN SOD (PORK) LOCK FLUSH 100 UNIT/ML IV SOLN
500.0000 [IU] | Freq: Once | INTRAVENOUS | Status: AC
  Administered 2017-02-06: 500 [IU] via INTRAVENOUS

## 2017-03-20 ENCOUNTER — Inpatient Hospital Stay: Payer: Medicaid Other | Attending: Internal Medicine

## 2017-06-15 ENCOUNTER — Emergency Department
Admission: EM | Admit: 2017-06-15 | Discharge: 2017-06-15 | Disposition: A | Discharge status: Home / Self Care | Payer: Medicaid Other | Attending: Student in an Organized Health Care Education/Training Program | Admitting: Student in an Organized Health Care Education/Training Program

## 2017-06-15 ENCOUNTER — Emergency Department: Payer: Medicaid Other

## 2017-06-15 ENCOUNTER — Other Ambulatory Visit: Payer: Self-pay

## 2017-06-15 ENCOUNTER — Encounter: Payer: Self-pay | Admitting: Emergency Medicine

## 2017-06-15 DIAGNOSIS — Z87891 Personal history of nicotine dependence: Secondary | ICD-10-CM | POA: Insufficient documentation

## 2017-06-15 DIAGNOSIS — J45909 Unspecified asthma, uncomplicated: Secondary | ICD-10-CM | POA: Diagnosis not present

## 2017-06-15 DIAGNOSIS — J449 Chronic obstructive pulmonary disease, unspecified: Secondary | ICD-10-CM | POA: Insufficient documentation

## 2017-06-15 DIAGNOSIS — L03115 Cellulitis of right lower limb: Secondary | ICD-10-CM | POA: Insufficient documentation

## 2017-06-15 DIAGNOSIS — Z79899 Other long term (current) drug therapy: Secondary | ICD-10-CM | POA: Insufficient documentation

## 2017-06-15 DIAGNOSIS — I10 Essential (primary) hypertension: Secondary | ICD-10-CM | POA: Diagnosis not present

## 2017-06-15 DIAGNOSIS — R2241 Localized swelling, mass and lump, right lower limb: Secondary | ICD-10-CM | POA: Diagnosis present

## 2017-06-15 DIAGNOSIS — R600 Localized edema: Secondary | ICD-10-CM

## 2017-06-15 LAB — COMPREHENSIVE METABOLIC PANEL
ALT: 8 U/L — ABNORMAL LOW (ref 14–54)
AST: 17 U/L (ref 15–41)
Albumin: 3.2 g/dL — ABNORMAL LOW (ref 3.5–5.0)
Alkaline Phosphatase: 65 U/L (ref 38–126)
Anion gap: 7 (ref 5–15)
BUN: 16 mg/dL (ref 6–20)
CHLORIDE: 103 mmol/L (ref 101–111)
CO2: 25 mmol/L (ref 22–32)
Calcium: 8.6 mg/dL — ABNORMAL LOW (ref 8.9–10.3)
Creatinine, Ser: 0.86 mg/dL (ref 0.44–1.00)
Glucose, Bld: 98 mg/dL (ref 65–99)
POTASSIUM: 4.1 mmol/L (ref 3.5–5.1)
Sodium: 135 mmol/L (ref 135–145)
Total Bilirubin: 0.6 mg/dL (ref 0.3–1.2)
Total Protein: 6.3 g/dL — ABNORMAL LOW (ref 6.5–8.1)

## 2017-06-15 LAB — CBC WITH DIFFERENTIAL/PLATELET
Basophils Absolute: 0 10*3/uL (ref 0–0.1)
Basophils Relative: 0 %
Eosinophils Absolute: 0.1 10*3/uL (ref 0–0.7)
Eosinophils Relative: 1 %
HEMATOCRIT: 36.1 % (ref 35.0–47.0)
Hemoglobin: 12.2 g/dL (ref 12.0–16.0)
LYMPHS ABS: 1.2 10*3/uL (ref 1.0–3.6)
LYMPHS PCT: 11 %
MCH: 32.8 pg (ref 26.0–34.0)
MCHC: 33.9 g/dL (ref 32.0–36.0)
MCV: 96.6 fL (ref 80.0–100.0)
MONOS PCT: 7 %
Monocytes Absolute: 0.8 10*3/uL (ref 0.2–0.9)
Neutro Abs: 8.5 10*3/uL — ABNORMAL HIGH (ref 1.4–6.5)
Neutrophils Relative %: 81 %
Platelets: 199 10*3/uL (ref 150–440)
RBC: 3.74 MIL/uL — AB (ref 3.80–5.20)
RDW: 14.8 % — ABNORMAL HIGH (ref 11.5–14.5)
WBC: 10.7 10*3/uL (ref 3.6–11.0)

## 2017-06-15 MED ORDER — CLINDAMYCIN HCL 300 MG PO CAPS: 300.0000 mg | ORAL_CAPSULE | Freq: Three times a day (TID) | ORAL | 0 refills | Status: AC

## 2017-06-15 MED ORDER — CLINDAMYCIN HCL 150 MG PO CAPS
ORAL_CAPSULE | ORAL | Status: AC
  Administered 2017-06-15: 300 mg via ORAL
  Filled 2017-06-15: qty 2

## 2017-06-15 MED ORDER — TRAMADOL HCL 50 MG PO TABS: 50.0000 mg | ORAL_TABLET | Freq: Four times a day (QID) | ORAL | 0 refills | Status: DC | PRN

## 2017-06-15 MED ORDER — CLINDAMYCIN HCL 300 MG PO CAPS
300.0000 mg | ORAL_CAPSULE | Freq: Once | ORAL | Status: AC
  Administered 2017-06-15: 300 mg via ORAL

## 2017-06-15 NOTE — ED Triage Notes (Signed)
Pt started with RLE edema/redness on Saturday.  Slightly warmer than left but difficult to assess r/t pts pants.  No hx dvt.  No fevers. Has an aching pain to leg.

## 2017-06-15 NOTE — ED Provider Notes (Signed)
[facility] Emergency Department Provider Note    First MD Initiated Contact with Patient 06/15/17 1458     (approximate)  I have reviewed the triage vital signs and the nursing notes.   HISTORY  Chief Complaint Leg Swelling    HPI Charlene [name] is a 56 y.o. female with a history of bipolar disorder as well as COPD but no history of diabetes presents for evaluation of swelling and pain to the right lower extremity since Saturday.  Has never had similar symptoms in the past.  Denies any trauma has noted some dry skin scrapes on the bottom of her foot.  Denies any fevers.  No nausea or vomiting.  No history of blood clot.  States the pain is burning in sensation and mild to moderate in severity without radiation.  Past Medical History:  Diagnosis Date  . Asthma   . Bipolar 2 disorder ([place])   . Borderline schizophrenia ([place])   . COPD (chronic obstructive pulmonary disease) ([place])   . Falls infrequently   . Hypertension   . Primary cancer of right lower lobe of lung ([place]) 07/12/2015   Small cell undifferentiated carcinoma of lung.  Diagnosis at [facility] by fine-needle aspiration of lymph node (January, 2017)  . Ulcer    Family History  Problem Relation Age of Onset  . Hypertension Father    Past Surgical History:  Procedure Laterality Date  . ABDOMINAL HYSTERECTOMY    . APPENDECTOMY    . borderline schizophrenia    . chronic back pain    . NASAL SINUS SURGERY     x2  . PERIPHERAL VASCULAR CATHETERIZATION N/A 07/30/2015   Procedure: Glori Luis Cath Insertion;  Surgeon: Algernon Huxley, MD;  Location: [place] [facility];  Service: Cardiovascular;  Laterality: N/A;  . STOMACH SURGERY     Patient Active Problem List   Diagnosis Date Noted  . Respiratory failure ([place])   . Lethargy   . COPD exacerbation ([place]) 11/16/2016  . HCAP (healthcare-associated pneumonia) 12/31/2015  . Acute encephalopathy 12/31/2015  . Herpes zoster 12/31/2015  . Anemia  12/31/2015  . Acute psychosis associated with endocrine, metabolic, or cerebrovascular disorder 12/21/2015  . Cocaine use disorder, moderate, dependence ([place]) 12/21/2015  . Bipolar I disorder, most recent episode manic, severe with psychotic features ([place]) 12/21/2015  . OCD (obsessive compulsive disorder) 12/21/2015  . PTSD (post-traumatic stress disorder) 12/21/2015  . Abnormal cells of cervix 07/30/2015  . Encounter for general adult medical examination without abnormal findings 07/28/2015  . Primary cancer of right lower lobe of lung ([place]) 07/12/2015  . At risk for noncompliance 06/29/2015  . Spasm 06/26/2015  . Essential (primary) hypertension 06/21/2015  . Lung mass 06/21/2015  . Candida infection of mouth 06/21/2015  . Episodic mood disorder ([place]) 10/14/2014  . Chronic obstructive pulmonary disease ([place]) 10/06/2014  . Pulmonary emphysema ([place]) 10/06/2014  . LBP (low back pain) 09/27/2014  . Tobacco use disorder 09/27/2014      Prior to Admission medications   Medication Sig Start Date End Date Taking? Authorizing Provider  acetaminophen (TYLENOL) 500 MG tablet Take 500 mg by mouth every 6 (six) hours as needed.    [provider]  albuterol (PROVENTIL HFA;VENTOLIN HFA) 108 (90 Base) MCG/ACT inhaler Inhale 2 puffs into the lungs every 4 (four) hours as needed for wheezing or shortness of breath.    [provider]  cephALEXin (KEFLEX) 500 MG capsule Take 1 capsule (500 mg total) by mouth  3 (three) times daily. 12/26/16   Cammie Sickle, MD  clindamycin (CLEOCIN) 300 MG capsule Take 1 capsule (300 mg total) by mouth 3 (three) times daily for 10 days. 06/15/17 06/25/17  Merlyn Lot, MD  cyclobenzaprine (FLEXERIL) 10 MG tablet Take 10 mg by mouth 2 (two) times daily.  06/26/15   [provider]  diltiazem (CARDIZEM CD) 120 MG 24 hr capsule Take 1 capsule (120 mg total) by mouth daily. 01/11/16   Max Sane, MD  divalproex (DEPAKOTE) 500 MG DR  tablet Take 2 tablets (1,000 mg total) by mouth every 12 (twelve) hours. 01/11/16   Max Sane, MD  fluticasone (FLONASE) 50 MCG/ACT nasal spray 1 spray by Each Nare route daily. 07/03/15 08/23/66  [provider]  Fluticasone-Salmeterol (ADVAIR DISKUS) 500-50 MCG/DOSE AEPB Inhale 1 puff into the lungs 2 (two) times daily. 12/26/16   Cammie Sickle, MD  ipratropium-albuterol (DUONEB) 0.5-2.5 (3) MG/3ML SOLN Take 3 mLs by nebulization every 4 (four) hours as needed. 12/26/16   Cammie Sickle, MD  loratadine (CLARITIN) 10 MG tablet Take 10 mg by mouth daily as needed.     [provider]  Multiple Vitamin (MULTIVITAMIN) tablet Take 1 tablet by mouth daily.    [provider]  omeprazole (PRILOSEC) 20 MG capsule Take 20 mg by mouth daily.    [provider]  predniSONE (DELTASONE) 20 MG tablet 2 pills once a day X 7 days; and then one pill once a day x7 days. 12/26/16   Cammie Sickle, MD  risperiDONE (RISPERDAL) 1 MG tablet Take 1 tablet (1 mg total) by mouth at bedtime. 11/24/16   Bettey Costa, MD  sucralfate (CARAFATE) 1 g tablet Take 1 tablet (1 g total) by mouth 4 (four) times daily. Dissolve in 2-3 tbsp warm water, swish and swallow. 11/01/15   Forest Gleason, MD  tiotropium (SPIRIVA HANDIHALER) 18 MCG inhalation capsule Place 18 mcg into inhaler and inhale daily.    [provider]  traMADol (ULTRAM) 50 MG tablet Take 1 tablet (50 mg total) by mouth every 6 (six) hours as needed. 06/15/17 06/15/18  Merlyn Lot, MD  vitamin C (ASCORBIC ACID) 500 MG tablet Take 500 mg by mouth daily.    [provider]    Allergies Amoxicillin-pot clavulanate and Septra [sulfamethoxazole-trimethoprim]    Social History Social History   Tobacco Use  . Smoking status: Former Smoker    Packs/day: 0.50    Years: 30.00    Pack years: 15.00    Last attempt to quit: 06/17/2015    Years since quitting: 1.9  . Smokeless tobacco: Never Used    Substance Use Topics  . Alcohol use: No    Alcohol/week: 0.0 oz  . Drug use: Yes    Types: Cocaine    Comment: States due to cocaine in mouthwash    Review of Systems Patient denies headaches, rhinorrhea, blurry vision, numbness, shortness of breath, chest pain, edema, cough, abdominal pain, nausea, vomiting, diarrhea, dysuria, fevers, rashes or hallucinations unless otherwise stated above in HPI. ____________________________________________   PHYSICAL EXAM:  VITAL SIGNS: Vitals:   06/15/17 1224 06/15/17 1532  BP: 91/70 108/85  Pulse: (!) 104 95  Resp: 16 14  Temp: 98.3 F (36.8 C)   SpO2: 94% 95%    Constitutional: Alert and oriented. Well appearing and in no acute distress. Eyes: Conjunctivae are normal.  Head: Atraumatic. Nose: No congestion/rhinnorhea. Mouth/Throat: Mucous membranes are moist.   Neck: Painless ROM.  Cardiovascular:  Good peripheral circulation. Respiratory: Normal respiratory effort.  No retractions.  Gastrointestinal: Soft and nontender.  Musculoskeletal: There is 1+ pitting edema to the right lower extremity with cellulitis below the right knee.  No crepitus, no blistering.  There are 2 small lacerations on the distal foot with surrounding erythema.  No purulence.  Cap refill is brisk.  DP and PT pulses are palpable.  No joint effusions. Neurologic:  Normal speech and language. No gross focal neurologic deficits are appreciated.  Skin:  Skin is warm, dry and intact. No rash noted. Psychiatric: Mood and affect are normal. Speech and behavior are normal.  ____________________________________________   LABS (all labs ordered are listed, but only abnormal results are displayed)  Results for orders placed or performed during the hospital encounter of 06/15/17 (from the past 24 hour(s))  Comprehensive metabolic panel     Status: Abnormal   Collection Time: 06/15/17 12:39 PM  Result Value Ref Range   Sodium 135 135 - 145 mmol/L   Potassium 4.1 3.5  - 5.1 mmol/L   Chloride 103 101 - 111 mmol/L   CO2 25 22 - 32 mmol/L   Glucose, Bld 98 65 - 99 mg/dL   BUN 16 6 - 20 mg/dL   Creatinine, Ser 0.86 0.44 - 1.00 mg/dL   Calcium 8.6 (L) 8.9 - 10.3 mg/dL   Total Protein 6.3 (L) 6.5 - 8.1 g/dL   Albumin 3.2 (L) 3.5 - 5.0 g/dL   AST 17 15 - 41 U/L   ALT 8 (L) 14 - 54 U/L   Alkaline Phosphatase 65 38 - 126 U/L   Total Bilirubin 0.6 0.3 - 1.2 mg/dL   GFR calc non Af Amer >60 >60 mL/min   GFR calc Af Amer >60 >60 mL/min   Anion gap 7 5 - 15  CBC with Differential     Status: Abnormal   Collection Time: 06/15/17 12:39 PM  Result Value Ref Range   WBC 10.7 3.6 - 11.0 K/uL   RBC 3.74 (L) 3.80 - 5.20 MIL/uL   Hemoglobin 12.2 12.0 - 16.0 g/dL   HCT 36.1 35.0 - 47.0 %   MCV 96.6 80.0 - 100.0 fL   MCH 32.8 26.0 - 34.0 pg   MCHC 33.9 32.0 - 36.0 g/dL   RDW 14.8 (H) 11.5 - 14.5 %   Platelets 199 150 - 440 K/uL   Neutrophils Relative % 81 %   Neutro Abs 8.5 (H) 1.4 - 6.5 K/uL   Lymphocytes Relative 11 %   Lymphs Abs 1.2 1.0 - 3.6 K/uL   Monocytes Relative 7 %   Monocytes Absolute 0.8 0.2 - 0.9 K/uL   Eosinophils Relative 1 %   Eosinophils Absolute 0.1 0 - 0.7 K/uL   Basophils Relative 0 %   Basophils Absolute 0.0 0 - 0.1 K/uL   ____________________________________________  ____________________________________________  RADIOLOGY  I personally reviewed all radiographic images ordered to evaluate for the above acute complaints and reviewed radiology reports and findings.  These findings were personally discussed with the patient.  Please see medical record for radiology report.  ____________________________________________   PROCEDURES  Procedure(s) performed:  Procedures    Critical Care performed: no ____________________________________________   INITIAL IMPRESSION / ASSESSMENT AND PLAN / ED COURSE  Pertinent labs & imaging results that were available during my care of the patient were reviewed by me and considered in my  medical decision making (see chart for details).  DDX: Cellulitis, DVT, ischemia, edema, NS tissue infection  Charlene [name] is a 56 y.o. who presents to the ED with leg swelling as described above.  Presentation consistent with cellulitis.  No evidence of DVT.  Blood work is reassuring.  Not consistent with limb ischemia.  No bony tenderness to suggest fracture.  Patient was a dry skin and breakdown certainly providing nidus for infection.  Will start on antibiotics give pain medications referral to podiatry.  Discussed leg elevation and signs and symptoms for which is patient should return immediately to the hospital.  Have discussed with the patient and available family all diagnostics and treatments performed thus far and all questions were answered to the best of my ability. The patient demonstrates understanding and agreement with plan.       ____________________________________________   FINAL CLINICAL IMPRESSION(S) / ED DIAGNOSES  Final diagnoses:  Leg edema  Cellulitis of right lower extremity      NEW MEDICATIONS STARTED DURING THIS VISIT:  This SmartLink is deprecated. Use AVSMEDLIST instead to display the medication list for a patient.   Note:  This document was prepared using Dragon voice recognition software and may include unintentional dictation errors.     Merlyn Lot, MD 06/15/17 [phone]

## 2017-06-26 ENCOUNTER — Ambulatory Visit: Admission: RE | Admit: 2017-06-26 | Payer: Medicaid Other | Source: Ambulatory Visit | Admitting: Internal Medicine

## 2017-06-26 ENCOUNTER — Ambulatory Visit
Admission: RE | Admit: 2017-06-26 | Discharge: 2017-06-26 | Disposition: A | Discharge status: Home / Self Care | Payer: Medicaid Other | Source: Ambulatory Visit | Attending: Internal Medicine | Admitting: Internal Medicine

## 2017-06-26 DIAGNOSIS — C3431 Malignant neoplasm of lower lobe, right bronchus or lung: Secondary | ICD-10-CM | POA: Insufficient documentation

## 2017-06-26 DIAGNOSIS — R918 Other nonspecific abnormal finding of lung field: Secondary | ICD-10-CM | POA: Diagnosis not present

## 2017-06-26 DIAGNOSIS — K7689 Other specified diseases of liver: Secondary | ICD-10-CM | POA: Diagnosis not present

## 2017-06-26 DIAGNOSIS — J439 Emphysema, unspecified: Secondary | ICD-10-CM | POA: Diagnosis not present

## 2017-06-26 MED ORDER — IOPAMIDOL (ISOVUE-300) INJECTION 61%
75.0000 mL | Freq: Once | INTRAVENOUS | Status: AC | PRN
  Administered 2017-06-26: 75 mL via INTRAVENOUS

## 2017-06-29 ENCOUNTER — Inpatient Hospital Stay: Payer: Medicaid Other | Attending: Internal Medicine

## 2017-06-29 ENCOUNTER — Inpatient Hospital Stay (HOSPITAL_BASED_OUTPATIENT_CLINIC_OR_DEPARTMENT_OTHER): Payer: Medicaid Other | Admitting: Internal Medicine

## 2017-06-29 VITALS — BP 101/69 | HR 103 | Temp 98.0°F | Resp 16 | Wt 189.0 lb

## 2017-06-29 DIAGNOSIS — Z95828 Presence of other vascular implants and grafts: Secondary | ICD-10-CM

## 2017-06-29 DIAGNOSIS — L03115 Cellulitis of right lower limb: Secondary | ICD-10-CM | POA: Insufficient documentation

## 2017-06-29 DIAGNOSIS — C3431 Malignant neoplasm of lower lobe, right bronchus or lung: Secondary | ICD-10-CM

## 2017-06-29 DIAGNOSIS — R5382 Chronic fatigue, unspecified: Secondary | ICD-10-CM | POA: Insufficient documentation

## 2017-06-29 DIAGNOSIS — M7989 Other specified soft tissue disorders: Secondary | ICD-10-CM

## 2017-06-29 DIAGNOSIS — R05 Cough: Secondary | ICD-10-CM

## 2017-06-29 DIAGNOSIS — R0609 Other forms of dyspnea: Secondary | ICD-10-CM

## 2017-06-29 LAB — COMPREHENSIVE METABOLIC PANEL
ALT: 9 U/L — ABNORMAL LOW (ref 14–54)
AST: 19 U/L (ref 15–41)
Albumin: 3.2 g/dL — ABNORMAL LOW (ref 3.5–5.0)
Alkaline Phosphatase: 62 U/L (ref 38–126)
Anion gap: 6 (ref 5–15)
BILIRUBIN TOTAL: 0.4 mg/dL (ref 0.3–1.2)
BUN: 13 mg/dL (ref 6–20)
CHLORIDE: 101 mmol/L (ref 101–111)
CO2: 28 mmol/L (ref 22–32)
Calcium: 8.8 mg/dL — ABNORMAL LOW (ref 8.9–10.3)
Creatinine, Ser: 0.72 mg/dL (ref 0.44–1.00)
Glucose, Bld: 93 mg/dL (ref 65–99)
POTASSIUM: 4.5 mmol/L (ref 3.5–5.1)
Sodium: 135 mmol/L (ref 135–145)
TOTAL PROTEIN: 6.4 g/dL — AB (ref 6.5–8.1)

## 2017-06-29 LAB — CBC WITH DIFFERENTIAL/PLATELET
BASOS ABS: 0.1 10*3/uL (ref 0–0.1)
Basophils Relative: 1 %
EOS ABS: 0.1 10*3/uL (ref 0–0.7)
EOS PCT: 2 %
HCT: 36.2 % (ref 35.0–47.0)
HEMOGLOBIN: 12.1 g/dL (ref 12.0–16.0)
LYMPHS ABS: 1.3 10*3/uL (ref 1.0–3.6)
LYMPHS PCT: 16 %
MCH: 32.3 pg (ref 26.0–34.0)
MCHC: 33.5 g/dL (ref 32.0–36.0)
MCV: 96.3 fL (ref 80.0–100.0)
Monocytes Absolute: 0.7 10*3/uL (ref 0.2–0.9)
Monocytes Relative: 9 %
NEUTROS PCT: 72 %
Neutro Abs: 5.8 10*3/uL (ref 1.4–6.5)
PLATELETS: 256 10*3/uL (ref 150–440)
RBC: 3.75 MIL/uL — AB (ref 3.80–5.20)
RDW: 14.2 % (ref 11.5–14.5)
WBC: 7.9 10*3/uL (ref 3.6–11.0)

## 2017-06-29 MED ORDER — HYDROCOD POLST-CPM POLST ER 10-8 MG/5ML PO SUER: 5.0000 mL | Freq: Every evening | ORAL | 0 refills | Status: DC | PRN

## 2017-06-29 MED ORDER — SODIUM CHLORIDE 0.9% FLUSH
10.0000 mL | INTRAVENOUS | Status: AC | PRN
  Administered 2017-06-29: 10 mL
  Filled 2017-06-29: qty 10

## 2017-06-29 MED ORDER — HEPARIN SOD (PORK) LOCK FLUSH 100 UNIT/ML IV SOLN
500.0000 [IU] | INTRAVENOUS | Status: AC | PRN
  Administered 2017-06-29: 500 [IU]

## 2017-06-29 MED ORDER — LEVOFLOXACIN 500 MG PO TABS: 500.0000 mg | ORAL_TABLET | Freq: Every day | ORAL | 0 refills | Status: DC

## 2017-06-29 NOTE — Progress Notes (Signed)
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  Needle aspiration of lymph node was positive for small cell carcinoma of lung. Clinically  Staged  As  T1 N2 M0 tumor.    2.PET scan shows localized disease.MRI of brain is negative for any metastases  3.  Chemotherapy with cis-platinum and VP-16 has been started on July 18, 2015 4.starting radiation therapy to the chest from August 23 2015 .  5.Last chemotherapy (fourth cycle) October 03, 2015 patient has finished radiation therapy.  # AUG 2017- CT C/A/P- NED- stable RML- scarring.      Primary cancer of right lower lobe of lung ([place])   07/12/2015 Initial Diagnosis    Primary cancer of right lower lobe of lung [facility])       INTERVAL HISTORY:  57 year old female patient with above history of limited stage small cell lung cancer currently status post chemoradiation fusion April 2017 is here for follow-up/ Review the results of her CT scan.  Patient complains of worsening cough worsening shortness of breath on exertion.  Denies any chest pain.  Denies any hemoptysis.  Denies any worsening headaches.  Chronic fatigue.  Patient also noted to have swelling of the legs chronic.  Not getting any better.  REVIEW OF SYSTEMS:   A complete 10 point review of system is done which is negative for mentioned above in history of present illness  I have reviewed the past medical history, past surgical history, social history and family history with the patient and they are unchanged from previous note.  ALLERGIES:  is allergic to amoxicillin-pot clavulanate and septra [sulfamethoxazole-trimethoprim].  MEDICATIONS:  Current Outpatient Medications  Medication Sig Dispense Refill  .  acetaminophen (TYLENOL) 500 MG tablet Take 500 mg by mouth every 6 (six) hours as needed.    Marland Kitchen albuterol (PROVENTIL HFA;VENTOLIN HFA) 108 (90 Base) MCG/ACT inhaler Inhale 2 puffs into the lungs every 4 (four) hours as needed for wheezing or shortness of breath.    . chlorpheniramine-HYDROcodone (TUSSIONEX) 10-8 MG/5ML SUER Take 5 mLs by mouth at bedtime as needed for cough. 140 mL 0  . cyclobenzaprine (FLEXERIL) 10 MG tablet Take 10 mg by mouth 2 (two) times daily.     Marland Kitchen diltiazem (CARDIZEM CD) 120 MG 24 hr capsule Take 1 capsule (120 mg total) by mouth daily. 30 capsule 0  . divalproex (DEPAKOTE) 500 MG DR tablet Take 2 tablets (1,000 mg total) by mouth every 12 (twelve) hours. 60 tablet 0  . fluticasone (FLONASE) 50 MCG/ACT nasal spray 1 spray by Each Nare route daily.    . Fluticasone-Salmeterol (ADVAIR DISKUS) 500-50 MCG/DOSE AEPB Inhale 1 puff into the lungs 2 (two) times daily. 1 each 3  . ipratropium-albuterol (DUONEB) 0.5-2.5 (3) MG/3ML SOLN Take 3 mLs by nebulization every 4 (four) hours as needed. 360 mL 3  . levofloxacin (LEVAQUIN) 500 MG tablet Take 1 tablet (500 mg total) by mouth daily. 10 tablet 0  . loratadine (CLARITIN) 10 MG tablet Take 10 mg by mouth daily as needed.     . Multiple Vitamin (MULTIVITAMIN) tablet Take 1 tablet by mouth daily.    Marland Kitchen omeprazole (PRILOSEC) 20 MG capsule Take 20 mg by mouth daily.    . risperiDONE (RISPERDAL) 1 MG  tablet Take 1 tablet (1 mg total) by mouth at bedtime. 30 tablet 0  . sucralfate (CARAFATE) 1 g tablet Take 1 tablet (1 g total) by mouth 4 (four) times daily. Dissolve in 2-3 tbsp warm water, swish and swallow. 120 tablet 3  . tiotropium (SPIRIVA HANDIHALER) 18 MCG inhalation capsule Place 18 mcg into inhaler and inhale daily.    . traMADol (ULTRAM) 50 MG tablet Take 1 tablet (50 mg total) by mouth every 6 (six) hours as needed. 20 tablet 0  . vitamin C (ASCORBIC ACID) 500 MG tablet Take 500 mg by mouth daily.     No current  facility-administered medications for this visit.     PHYSICAL EXAMINATION: ECOG PERFORMANCE STATUS: 1 - Symptomatic but completely ambulatory  Vitals:   06/29/17 1513  BP: 101/69  Pulse: (!) 103  Resp: 16  Temp: 98 F (36.7 C)   Filed Weights   06/29/17 1513  Weight: 189 lb (85.7 kg)    GENERAL:alert, no distress and comfortable; She is alone.  She is walking herself. SKIN: skin color, texture, turgor are normal, no rashes or significant lesions EYES: normal, Conjunctiva are pink and non-injected, sclera clear OROPHARYNX:no exudate, no erythema and lips, buccal mucosa, and tongue normal  NECK: supple, thyroid normal size, non-tender, without nodularity LYMPH:  no palpable lymphadenopathy in the cervical, axillary or inguinal LUNGS:  Decreased breath sounds to auscultation; bilateral coarse breath sounds/ wheezing.  HEART: regular rate & rhythm and no murmurs and no lower extremity edema ABDOMEN:abdomen soft, non-tender and normal bowel sounds Musculoskeletal:no cyanosis of digits and no clubbing  NEURO: alert & oriented x 3 with fluent speech, no focal motor/sensory deficits  LABORATORY DATA:  I have reviewed the data as listed    Component Value Date/Time   NA 135 06/29/2017 1452   K 4.5 06/29/2017 1452   CL 101 06/29/2017 1452   CO2 28 06/29/2017 1452   GLUCOSE 93 06/29/2017 1452   BUN 13 06/29/2017 1452   CREATININE 0.72 06/29/2017 1452   CALCIUM 8.8 (L) 06/29/2017 1452   PROT 6.4 (L) 06/29/2017 1452   ALBUMIN 3.2 (L) 06/29/2017 1452   AST 19 06/29/2017 1452   ALT 9 (L) 06/29/2017 1452   ALKPHOS 62 06/29/2017 1452   BILITOT 0.4 06/29/2017 1452   GFRNONAA >60 06/29/2017 1452   GFRAA >60 06/29/2017 1452    No results found for: SPEP, UPEP  Lab Results  Component Value Date   WBC 7.9 06/29/2017   NEUTROABS 5.8 06/29/2017   HGB 12.1 06/29/2017   HCT 36.2 06/29/2017   MCV 96.3 06/29/2017   PLT 256 06/29/2017      Chemistry      Component Value  Date/Time   NA 135 06/29/2017 1452   K 4.5 06/29/2017 1452   CL 101 06/29/2017 1452   CO2 28 06/29/2017 1452   BUN 13 06/29/2017 1452   CREATININE 0.72 06/29/2017 1452      Component Value Date/Time   CALCIUM 8.8 (L) 06/29/2017 1452   ALKPHOS 62 06/29/2017 1452   AST 19 06/29/2017 1452   ALT 9 (L) 06/29/2017 1452   BILITOT 0.4 06/29/2017 1452     IMPRESSION: 1. Interval progression of scattered airspace opacity in the right lower lobe with new airspace disease in the lingula. Multifocal pneumonia would be a consideration. Recurrent disease less likely, but continued close attention recommended. 2.  Emphysema. (YTK35-W65.9)   Electronically Signed   By: Misty Stanley M.D.   On: 06/26/2017  13:11  RADIOGRAPHIC STUDIES: I have personally reviewed the radiological images as listed and agreed with the findings in the report. No results found.   ASSESSMENT & PLAN:  Primary cancer of right lower lobe of lung (HCC) Limited stage small cell lung cancer- s/p chemo-RT. finished April 2017 good response.   # Clinically no evidence of recurrence of malignancy; however pneumonia suspected [see discussion below]  # s/p WBRT PCI-  gen weakness/hearing loss- stable.   # cough- acute Bronchitis/COPD- CT chest- [place]- prednisone/Recommend nebulizer/ combivent treatments/ Advair.   Recommend Levaquin.   # Right LE- cellluitis- recent; referral to vascular;   #Recommend follow-up in 2 months labs port flush.   # I reviewed the blood work- with the patient in detail; also reviewed the imaging independently [as summarized above]; and with the patient in detail.    Orders Placed This Encounter  Procedures  . Ambulatory referral to Vascular Surgery    Referral Priority:   Routine    Referral Type:   Surgical    Referral Reason:   Specialty Services Required    Referred to Provider:   Algernon Huxley, MD    Requested Specialty:   Vascular Surgery    Number of Visits Requested:   [place], MD 07/14/2017 8:19 PM

## 2017-06-29 NOTE — Assessment & Plan Note (Addendum)
Limited stage small cell lung cancer- s/p chemo-RT. finished April 2017 good response.   # Clinically no evidence of recurrence of malignancy; however pneumonia suspected [see discussion below]  # s/p WBRT PCI-  gen weakness/hearing loss- stable.   # cough- acute Bronchitis/COPD- CT chest- [place]- prednisone/Recommend nebulizer/ combivent treatments/ Advair.   Recommend Levaquin.   # Right LE- cellluitis- recent; referral to vascular;   #Recommend follow-up in 2 months labs port flush.   # I reviewed the blood work- with the patient in detail; also reviewed the imaging independently [as summarized above]; and with the patient in detail.

## 2017-08-04 ENCOUNTER — Encounter (INDEPENDENT_AMBULATORY_CARE_PROVIDER_SITE_OTHER): Payer: Self-pay | Admitting: Vascular Surgery

## 2017-08-28 ENCOUNTER — Inpatient Hospital Stay (HOSPITAL_BASED_OUTPATIENT_CLINIC_OR_DEPARTMENT_OTHER): Payer: Medicaid Other | Admitting: Internal Medicine

## 2017-08-28 ENCOUNTER — Other Ambulatory Visit: Payer: Self-pay

## 2017-08-28 ENCOUNTER — Inpatient Hospital Stay: Payer: Medicaid Other | Attending: Internal Medicine

## 2017-08-28 VITALS — BP 142/93 | HR 93 | Temp 97.9°F | Resp 20 | Ht 68.0 in | Wt 191.0 lb

## 2017-08-28 DIAGNOSIS — R635 Abnormal weight gain: Secondary | ICD-10-CM

## 2017-08-28 DIAGNOSIS — R062 Wheezing: Secondary | ICD-10-CM

## 2017-08-28 DIAGNOSIS — Z95828 Presence of other vascular implants and grafts: Secondary | ICD-10-CM

## 2017-08-28 DIAGNOSIS — R140 Abdominal distension (gaseous): Secondary | ICD-10-CM | POA: Diagnosis not present

## 2017-08-28 DIAGNOSIS — C3431 Malignant neoplasm of lower lobe, right bronchus or lung: Secondary | ICD-10-CM | POA: Insufficient documentation

## 2017-08-28 DIAGNOSIS — J449 Chronic obstructive pulmonary disease, unspecified: Secondary | ICD-10-CM

## 2017-08-28 DIAGNOSIS — R0602 Shortness of breath: Secondary | ICD-10-CM | POA: Insufficient documentation

## 2017-08-28 LAB — CBC WITH DIFFERENTIAL/PLATELET
Basophils Absolute: 0.1 10*3/uL (ref 0–0.1)
Basophils Relative: 1 %
EOS ABS: 0.1 10*3/uL (ref 0–0.7)
EOS PCT: 1 %
HCT: 33.5 % — ABNORMAL LOW (ref 35.0–47.0)
Hemoglobin: 11.6 g/dL — ABNORMAL LOW (ref 12.0–16.0)
LYMPHS ABS: 1.7 10*3/uL (ref 1.0–3.6)
Lymphocytes Relative: 19 %
MCH: 33.4 pg (ref 26.0–34.0)
MCHC: 34.7 g/dL (ref 32.0–36.0)
MCV: 96.4 fL (ref 80.0–100.0)
MONO ABS: 0.7 10*3/uL (ref 0.2–0.9)
MONOS PCT: 8 %
Neutro Abs: 6.3 10*3/uL (ref 1.4–6.5)
Neutrophils Relative %: 71 %
PLATELETS: 229 10*3/uL (ref 150–440)
RBC: 3.48 MIL/uL — ABNORMAL LOW (ref 3.80–5.20)
RDW: 14.3 % (ref 11.5–14.5)
WBC: 8.8 10*3/uL (ref 3.6–11.0)

## 2017-08-28 LAB — COMPREHENSIVE METABOLIC PANEL
ALBUMIN: 3.2 g/dL — AB (ref 3.5–5.0)
ALT: 8 U/L — AB (ref 14–54)
AST: 17 U/L (ref 15–41)
Alkaline Phosphatase: 57 U/L (ref 38–126)
Anion gap: 5 (ref 5–15)
BUN: 12 mg/dL (ref 6–20)
CHLORIDE: 102 mmol/L (ref 101–111)
CO2: 27 mmol/L (ref 22–32)
CREATININE: 0.72 mg/dL (ref 0.44–1.00)
Calcium: 8.5 mg/dL — ABNORMAL LOW (ref 8.9–10.3)
GFR calc Af Amer: >60 mL/min (ref >60)
GLUCOSE: 86 mg/dL (ref 65–99)
Potassium: 4.2 mmol/L (ref 3.5–5.1)
SODIUM: 134 mmol/L — AB (ref 135–145)
Total Bilirubin: 0.5 mg/dL (ref 0.3–1.2)
Total Protein: 6.3 g/dL — ABNORMAL LOW (ref 6.5–8.1)

## 2017-08-28 MED ORDER — SODIUM CHLORIDE 0.9% FLUSH
10.0000 mL | INTRAVENOUS | Status: AC | PRN
  Administered 2017-08-28: 10 mL
  Filled 2017-08-28: qty 10

## 2017-08-28 MED ORDER — HEPARIN SOD (PORK) LOCK FLUSH 100 UNIT/ML IV SOLN
500.0000 [IU] | INTRAVENOUS | Status: AC | PRN
  Administered 2017-08-28: 500 [IU]

## 2017-08-28 NOTE — Progress Notes (Signed)
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  Needle aspiration of lymph node was positive for small cell carcinoma of lung. Clinically  Staged  As  T1 N2 M0 tumor.    2.PET scan shows localized disease.MRI of brain is negative for any metastases  3.  Chemotherapy with cis-platinum and VP-16 has been started on July 18, 2015 4.starting radiation therapy to the chest from August 23 2015 .  5.Last chemotherapy (fourth cycle) October 03, 2015 patient has finished radiation therapy.  # AUG 2017- CT C/A/P- NED- stable RML- scarring.      Primary cancer of right lower lobe of lung ([place])   07/12/2015 Initial Diagnosis    Primary cancer of right lower lobe of lung [facility])       INTERVAL HISTORY:  57 year old female patient with above history of limited stage small cell lung cancer currently status post chemoradiation fusion April 2017 is here for follow-up.  Patient complains of weight gain.  Complains of abdominal distention.  Chronic mild swelling in the legs.  States that she has not been recently been on steroids.  Patient also complains of worsening cough with wheezing.   Patient has chronic fatigue.  His appetite is good.  REVIEW OF SYSTEMS:   A complete 10 point review of system is done which is negative for mentioned above in history of present illness  I have reviewed the past medical history, past surgical history, social history and family history with the patient and they are unchanged from previous note.  ALLERGIES:  is allergic to amoxicillin-pot clavulanate and septra [sulfamethoxazole-trimethoprim].  MEDICATIONS:  Current Outpatient Medications  Medication Sig Dispense Refill  . acetaminophen (TYLENOL)  500 MG tablet Take 500 mg by mouth every 6 (six) hours as needed.    Marland Kitchen albuterol (PROVENTIL HFA;VENTOLIN HFA) 108 (90 Base) MCG/ACT inhaler Inhale 2 puffs into the lungs every 4 (four) hours as needed for wheezing or shortness of breath.    . cyclobenzaprine (FLEXERIL) 10 MG tablet Take 10 mg by mouth 2 (two) times daily.     Marland Kitchen diltiazem (CARDIZEM CD) 120 MG 24 hr capsule Take 1 capsule (120 mg total) by mouth daily. 30 capsule 0  . divalproex (DEPAKOTE) 500 MG DR tablet Take 2 tablets (1,000 mg total) by mouth every 12 (twelve) hours. 60 tablet 0  . fluticasone (FLONASE) 50 MCG/ACT nasal spray 1 spray by Each Nare route daily.    . Fluticasone-Salmeterol (ADVAIR DISKUS) 500-50 MCG/DOSE AEPB Inhale 1 puff into the lungs 2 (two) times daily. 1 each 3  . ipratropium-albuterol (DUONEB) 0.5-2.5 (3) MG/3ML SOLN Take 3 mLs by nebulization every 4 (four) hours as needed. 360 mL 3  . loratadine (CLARITIN) 10 MG tablet Take 10 mg by mouth daily as needed.     . Multiple Vitamin (MULTIVITAMIN) tablet Take 1 tablet by mouth daily.    Marland Kitchen omeprazole (PRILOSEC) 20 MG capsule Take 20 mg by mouth daily.    . risperiDONE (RISPERDAL) 1 MG tablet Take 1 tablet (1 mg total) by mouth at bedtime. 30 tablet 0  . salmeterol (SEREVENT) 50 MCG/DOSE diskus inhaler Inhale 1 puff into the lungs 2 (two) times daily.    . sucralfate (CARAFATE) 1 g tablet Take 1  tablet (1 g total) by mouth 4 (four) times daily. Dissolve in 2-3 tbsp warm water, swish and swallow. 120 tablet 3  . tiotropium (SPIRIVA HANDIHALER) 18 MCG inhalation capsule Place 18 mcg into inhaler and inhale daily.    . chlorpheniramine-HYDROcodone (TUSSIONEX) 10-8 MG/5ML SUER Take 5 mLs by mouth at bedtime as needed for cough. (Patient not taking: Reported on 08/28/2017) 140 mL 0   No current facility-administered medications for this visit.     PHYSICAL EXAMINATION: ECOG PERFORMANCE STATUS: 1 - Symptomatic but completely ambulatory  Vitals:   08/28/17 1414   BP: (!) 142/93  Pulse: 93  Resp: 20  Temp: 97.9 F (36.6 C)   Filed Weights   08/28/17 1414  Weight: 191 lb (86.6 kg)    GENERAL:alert, no distress and comfortable; She is accompanied by family.  She is walking herself. SKIN: skin color, texture, turgor are normal, no rashes or significant lesions EYES: normal, Conjunctiva are pink and non-injected, sclera clear OROPHARYNX:no exudate, no erythema and lips, buccal mucosa, and tongue normal  NECK: supple, thyroid normal size, non-tender, without nodularity LYMPH:  no palpable lymphadenopathy in the cervical, axillary or inguinal LUNGS:  Decreased breath sounds to auscultation; bilateral coarse breath sounds/ wheezing.  HEART: regular rate & rhythm and no murmurs and no lower extremity edema ABDOMEN:abdomen soft, non-tender and normal bowel sounds; no significant distention noted; no tenderness no hepatosplenomegaly. Musculoskeletal:no cyanosis of digits and no clubbing  NEURO: alert & oriented x 3 with fluent speech, no focal motor/sensory deficits  LABORATORY DATA:  I have reviewed the data as listed    Component Value Date/Time   NA 134 (L) 08/28/2017 1359   K 4.2 08/28/2017 1359   CL 102 08/28/2017 1359   CO2 27 08/28/2017 1359   GLUCOSE 86 08/28/2017 1359   BUN 12 08/28/2017 1359   CREATININE 0.72 08/28/2017 1359   CALCIUM 8.5 (L) 08/28/2017 1359   PROT 6.3 (L) 08/28/2017 1359   ALBUMIN 3.2 (L) 08/28/2017 1359   AST 17 08/28/2017 1359   ALT 8 (L) 08/28/2017 1359   ALKPHOS 57 08/28/2017 1359   BILITOT 0.5 08/28/2017 1359   GFRNONAA >60 08/28/2017 1359   GFRAA >60 08/28/2017 1359    No results found for: SPEP, UPEP  Lab Results  Component Value Date   WBC 8.8 08/28/2017   NEUTROABS 6.3 08/28/2017   HGB 11.6 (L) 08/28/2017   HCT 33.5 (L) 08/28/2017   MCV 96.4 08/28/2017   PLT 229 08/28/2017      Chemistry      Component Value Date/Time   NA 134 (L) 08/28/2017 1359   K 4.2 08/28/2017 1359   CL 102  08/28/2017 1359   CO2 27 08/28/2017 1359   BUN 12 08/28/2017 1359   CREATININE 0.72 08/28/2017 1359      Component Value Date/Time   CALCIUM 8.5 (L) 08/28/2017 1359   ALKPHOS 57 08/28/2017 1359   AST 17 08/28/2017 1359   ALT 8 (L) 08/28/2017 1359   BILITOT 0.5 08/28/2017 1359     IMPRESSION: 1. Interval progression of scattered airspace opacity in the right lower lobe with new airspace disease in the lingula. Multifocal pneumonia would be a consideration. Recurrent disease less likely, but continued close attention recommended. 2.  Emphysema. (TOI71-I45.9)   Electronically Signed   By: Misty Stanley M.D.   On: 06/26/2017 13:11  RADIOGRAPHIC STUDIES: I have personally reviewed the radiological images as listed and agreed with the findings in the report. No results  found.   ASSESSMENT & PLAN:  Primary cancer of right lower lobe of lung (HCC) Limited stage small cell lung cancer- s/p chemo-RT. Finished April 2017 good response.  CT scan-Jan 2019- for recurrence.  # Clinically no evidence of recurrence of malignancy.  Recommend a follow-up CT scan in 2 months from now.  # s/p WBRT PCI-  gen weakness/hearing loss- stable.   #Weight gain-question abdominal distention-recommend ultrasound of the abdomen ASAP.  Also her TSH  # Chronic cough/COPD-  recommend nebulizer/ combivent treatments/ Advair.     #Recommend follow-up in 2 months labs port flush. Check TSH.  Will call patient regarding results of her ultrasound.    Orders Placed This Encounter  Procedures  . US Abdomen Complete    Standing Status:   Future    Standing Expiration Date:   08/28/2018    Order Specific Question:   Reason for Exam (SYMPTOM  OR DIAGNOSIS REQUIRED)    Answer:   abdominal distension    Order Specific Question:   Preferred imaging location?    Answer:   [facility]  . CT CHEST W CONTRAST    Standing Status:   Future    Standing Expiration Date:   08/29/2018    Order Specific Question:    If indicated for the ordered procedure, I authorize the administration of contrast media per Radiology protocol    Answer:   Yes    Order Specific Question:   Preferred imaging location?    Answer:   [facility]    Order Specific Question:   Radiology Contrast Protocol - do NOT remove file path    Answer:   [email or web address]    Order Specific Question:   Is patient pregnant?    Answer:   No  . CBC with Differential/Platelet    Standing Status:   Future    Number of Occurrences:   1    Standing Expiration Date:   08/29/2018  . Comprehensive metabolic panel    Standing Status:   Future    Number of Occurrences:   1    Standing Expiration Date:   08/29/2018  . CBC with Differential    Standing Status:   Future    Standing Expiration Date:   08/29/2018  . Comprehensive metabolic panel    Standing Status:   Future    Standing Expiration Date:   08/29/2018      Cammie Sickle, MD 08/30/2017 8:05 AM

## 2017-08-28 NOTE — Assessment & Plan Note (Addendum)
Limited stage small cell lung cancer- s/p chemo-RT. Finished April 2017 good response.  CT scan-Jan 2019- for recurrence.  # Clinically no evidence of recurrence of malignancy.  Recommend a follow-up CT scan in 2 months from now.  # s/p WBRT PCI-  gen weakness/hearing loss- stable.   #Weight gain-question abdominal distention-recommend ultrasound of the abdomen ASAP.  Also her TSH  # Chronic cough/COPD-  recommend nebulizer/ combivent treatments/ Advair.     #Recommend follow-up in 2 months labs port flush. Check TSH.  Will call patient regarding results of her ultrasound.

## 2017-09-02 ENCOUNTER — Telehealth: Payer: Self-pay | Admitting: *Deleted

## 2017-09-02 NOTE — Telephone Encounter (Signed)
-----   Message from Wilburn Cornelia sent at 09/02/2017  1:34 PM EDT ----- Regarding: [place] tomorrow Caryl Pina from [place] needs question about her appt tomorrow please. Forwarded VM to your desk [phone]

## 2017-09-02 NOTE — Telephone Encounter (Signed)
Contacted u/s dept back. Per md- pt needs complete abd u/s. He is not just looking for ascites. Left msg with Lilia Pro to give to [place].

## 2017-09-03 ENCOUNTER — Ambulatory Visit
Admission: RE | Admit: 2017-09-03 | Discharge: 2017-09-03 | Disposition: A | Discharge status: Home / Self Care | Payer: Medicaid Other | Source: Ambulatory Visit | Attending: Internal Medicine | Admitting: Internal Medicine

## 2017-09-03 DIAGNOSIS — R140 Abdominal distension (gaseous): Secondary | ICD-10-CM | POA: Insufficient documentation

## 2017-09-03 DIAGNOSIS — C3431 Malignant neoplasm of lower lobe, right bronchus or lung: Secondary | ICD-10-CM | POA: Insufficient documentation

## 2017-09-07 ENCOUNTER — Telehealth: Payer: Self-pay | Admitting: *Deleted

## 2017-09-07 NOTE — Telephone Encounter (Signed)
Contacted patient via telephone. Results provided to patient.

## 2017-09-07 NOTE — Telephone Encounter (Signed)
-----   Message from Cammie Sickle, MD sent at 09/07/2017  7:46 AM EDT ----- Please inform patient that ultrasound abdomen-is normal; no evidence of any fluid/or evidence of cancer.  No new recommendations follow-up as planned.

## 2017-10-28 ENCOUNTER — Ambulatory Visit: Admission: RE | Admit: 2017-10-28 | Payer: Medicaid Other | Source: Ambulatory Visit

## 2017-10-30 ENCOUNTER — Ambulatory Visit: Payer: Medicaid Other | Admitting: Internal Medicine

## 2017-10-30 ENCOUNTER — Other Ambulatory Visit: Payer: Medicaid Other

## 2017-11-06 ENCOUNTER — Encounter: Payer: Self-pay | Admitting: Internal Medicine

## 2017-11-06 ENCOUNTER — Telehealth: Payer: Self-pay | Admitting: Internal Medicine

## 2017-11-06 ENCOUNTER — Inpatient Hospital Stay: Payer: Medicaid Other | Attending: Internal Medicine | Admitting: Internal Medicine

## 2017-11-06 ENCOUNTER — Inpatient Hospital Stay: Payer: Medicaid Other

## 2017-11-06 DIAGNOSIS — R05 Cough: Secondary | ICD-10-CM | POA: Diagnosis not present

## 2017-11-06 DIAGNOSIS — R5382 Chronic fatigue, unspecified: Secondary | ICD-10-CM | POA: Diagnosis not present

## 2017-11-06 DIAGNOSIS — Z87891 Personal history of nicotine dependence: Secondary | ICD-10-CM | POA: Diagnosis not present

## 2017-11-06 DIAGNOSIS — J449 Chronic obstructive pulmonary disease, unspecified: Secondary | ICD-10-CM | POA: Diagnosis not present

## 2017-11-06 DIAGNOSIS — G8929 Other chronic pain: Secondary | ICD-10-CM | POA: Diagnosis not present

## 2017-11-06 DIAGNOSIS — M545 Low back pain: Secondary | ICD-10-CM | POA: Insufficient documentation

## 2017-11-06 DIAGNOSIS — C3431 Malignant neoplasm of lower lobe, right bronchus or lung: Secondary | ICD-10-CM | POA: Diagnosis not present

## 2017-11-06 DIAGNOSIS — R51 Headache: Secondary | ICD-10-CM | POA: Insufficient documentation

## 2017-11-06 DIAGNOSIS — R140 Abdominal distension (gaseous): Secondary | ICD-10-CM

## 2017-11-06 LAB — COMPREHENSIVE METABOLIC PANEL
ALBUMIN: 3 g/dL — AB (ref 3.5–5.0)
ALK PHOS: 66 U/L (ref 38–126)
ALT: 8 U/L — AB (ref 14–54)
AST: 17 U/L (ref 15–41)
Anion gap: 8 (ref 5–15)
BILIRUBIN TOTAL: 0.3 mg/dL (ref 0.3–1.2)
BUN: 11 mg/dL (ref 6–20)
CO2: 26 mmol/L (ref 22–32)
CREATININE: 0.65 mg/dL (ref 0.44–1.00)
Calcium: 8.8 mg/dL — ABNORMAL LOW (ref 8.9–10.3)
Chloride: 102 mmol/L (ref 101–111)
GFR calc Af Amer: >60 mL/min (ref >60)
Glucose, Bld: 102 mg/dL — ABNORMAL HIGH (ref 65–99)
POTASSIUM: 4.4 mmol/L (ref 3.5–5.1)
Sodium: 136 mmol/L (ref 135–145)
TOTAL PROTEIN: 6 g/dL — AB (ref 6.5–8.1)

## 2017-11-06 LAB — CBC WITH DIFFERENTIAL/PLATELET
BASOS ABS: 0.1 10*3/uL (ref 0–0.1)
Basophils Relative: 1 %
EOS PCT: 2 %
Eosinophils Absolute: 0.1 10*3/uL (ref 0–0.7)
HCT: 34.1 % — ABNORMAL LOW (ref 35.0–47.0)
Hemoglobin: 11.7 g/dL — ABNORMAL LOW (ref 12.0–16.0)
LYMPHS PCT: 20 %
Lymphs Abs: 1.5 10*3/uL (ref 1.0–3.6)
MCH: 32.6 pg (ref 26.0–34.0)
MCHC: 34.2 g/dL (ref 32.0–36.0)
MCV: 95.3 fL (ref 80.0–100.0)
MONO ABS: 0.7 10*3/uL (ref 0.2–0.9)
Monocytes Relative: 10 %
Neutro Abs: 5.1 10*3/uL (ref 1.4–6.5)
Neutrophils Relative %: 67 %
PLATELETS: 225 10*3/uL (ref 150–440)
RBC: 3.58 MIL/uL — ABNORMAL LOW (ref 3.80–5.20)
RDW: 14.9 % — AB (ref 11.5–14.5)
WBC: 7.5 10*3/uL (ref 3.6–11.0)

## 2017-11-06 MED ORDER — SODIUM CHLORIDE 0.9% FLUSH
10.0000 mL | Freq: Once | INTRAVENOUS | Status: AC
  Administered 2017-11-06: 10 mL via INTRAVENOUS
  Filled 2017-11-06: qty 10

## 2017-11-06 MED ORDER — MONTELUKAST SODIUM 10 MG PO TABS: 10.0000 mg | ORAL_TABLET | Freq: Every day | ORAL | 3 refills | Status: DC

## 2017-11-06 MED ORDER — HEPARIN SOD (PORK) LOCK FLUSH 100 UNIT/ML IV SOLN
500.0000 [IU] | Freq: Once | INTRAVENOUS | Status: AC
  Administered 2017-11-06: 500 [IU] via INTRAVENOUS

## 2017-11-06 NOTE — Assessment & Plan Note (Addendum)
#  Limited stage small cell lung cancer-status post chemoradiation [April 2017]; most recent CT scan January 2019; negative for recurrence/question pneumonia.  Stable  #Spoke to insurance this morning; approved CT of the chest-we will schedule ASAP to rule out any recurrence.  #Chronic fatigue/dizziness-secondary to whole brain radiation.   #Chronic cough COPD-stable  # Chronic headaches; worse in the last few days.  Discussed my concerns of metastatic disease to the brain.  For now recommend continue Claritin/add Singulair.  If not better to inform us; we will plan to get MRI of the brain.  CT scan asap; Follow TBD. Call [phone] cell.

## 2017-11-06 NOTE — Telephone Encounter (Signed)
CT chest approved; Thx

## 2017-11-06 NOTE — Progress Notes (Signed)
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  Needle aspiration of lymph node was positive for small cell carcinoma of lung. Clinically  Staged  As  T1 N2 M0 tumor.    2.PET scan shows localized disease. MRI of brain is negative for any metastases  3.  Chemotherapy with cis-platinum and VP-16 has been started on July 18, 2015 4.starting radiation therapy to the chest from August 23 2015 .  5.Last chemotherapy (fourth cycle) October 03, 2015 patient has finished radiation therapy.  # AUG 2017- CT C/A/P- NED- stable RML- scarring.   ---------------------------------------------------------------    DIAGNOSIS: [ ]  SMALL CELL LUNG CA  STAGE: LIMITED  ;GOALS: CURATIVE  CURRENT/MOST RECENT THERAPY [ CIS-RT; feb-March 2017] SURVEILLANCE       Primary cancer of right lower lobe of lung ([place])   07/12/2015 Initial Diagnosis    Primary cancer of right lower lobe of lung ([place])         INTERVAL HISTORY:  Charlene [name] 57 y.o.  female pleasant patient above history of small cell lung cancer limited stage is here for follow-up.  Patient complains of ongoing headaches chronic.  However noted to have worsening headaches bitemporal radiating to the eyes; associated with nasal congestion.  Denies any waking up at night because of headache.  Patient has chronic fatigue.  Chronic shortness of breath cough.  Currently improved.  Chronic back pain.   Review of Systems  Constitutional: Positive for malaise/fatigue. Negative for chills, diaphoresis, fever and weight loss.  HENT:  Positive for congestion. Negative for nosebleeds and sore throat.   Eyes: Negative for double vision.  Respiratory: Positive for cough and shortness of breath. Negative for hemoptysis, sputum production and wheezing.   Cardiovascular: Negative for chest pain, palpitations, orthopnea and leg swelling.  Gastrointestinal: Negative for abdominal pain, blood in stool, constipation, diarrhea, heartburn, melena, nausea and vomiting.  Genitourinary: Negative for dysuria, frequency and urgency.  Musculoskeletal: Positive for back pain and joint pain.  Skin: Negative.  Negative for itching and rash.  Neurological: Positive for dizziness, weakness and headaches. Negative for tingling and focal weakness.  Endo/Heme/Allergies: Does not bruise/bleed easily.  Psychiatric/Behavioral: Negative for depression. The patient is not nervous/anxious and does not have insomnia.       PAST MEDICAL HISTORY :  Past Medical History:  Diagnosis Date  . Asthma   . Bipolar 2 disorder ([place])   . Borderline schizophrenia ([place])   . COPD (chronic obstructive pulmonary disease) ([place])   . Falls infrequently   . Hypertension   . Primary cancer of right lower lobe of lung ([place]) 07/12/2015   Small cell undifferentiated carcinoma of lung.  Diagnosis at [facility] by fine-needle aspiration of lymph node (January, 2017)  . Ulcer     PAST SURGICAL HISTORY :   Past Surgical History:  Procedure Laterality Date  . ABDOMINAL HYSTERECTOMY    . APPENDECTOMY    . borderline schizophrenia    . chronic back pain    . NASAL SINUS SURGERY     x2  . PERIPHERAL  VASCULAR CATHETERIZATION N/A 07/30/2015   Procedure: Glori Luis Cath Insertion;  Surgeon: Algernon Huxley, MD;  Location: [place] [facility];  Service: Cardiovascular;  Laterality: N/A;  . STOMACH SURGERY      FAMILY HISTORY :   Family History  Problem Relation Age of Onset  . Hypertension Father     SOCIAL HISTORY:   Social History   Tobacco Use  . Smoking status:  Former Smoker    Packs/day: 0.50    Years: 30.00    Pack years: 15.00    Last attempt to quit: 06/17/2015    Years since quitting: 2.3  . Smokeless tobacco: Never Used  Substance Use Topics  . Alcohol use: No    Alcohol/week: 0.0 oz  . Drug use: Yes    Types: Cocaine    Comment: States due to cocaine in mouthwash    ALLERGIES:  is allergic to amoxicillin-pot clavulanate and septra [sulfamethoxazole-trimethoprim].  MEDICATIONS:  Current Outpatient Medications  Medication Sig Dispense Refill  . acetaminophen (TYLENOL) 500 MG tablet Take 500 mg by mouth every 6 (six) hours as needed.    Marland Kitchen albuterol (PROVENTIL HFA;VENTOLIN HFA) 108 (90 Base) MCG/ACT inhaler Inhale 2 puffs into the lungs every 4 (four) hours as needed for wheezing or shortness of breath.    . cyclobenzaprine (FLEXERIL) 10 MG tablet Take 10 mg by mouth 2 (two) times daily.     Marland Kitchen diltiazem (CARDIZEM CD) 120 MG 24 hr capsule Take 1 capsule (120 mg total) by mouth daily. 30 capsule 0  . divalproex (DEPAKOTE) 500 MG DR tablet Take 2 tablets (1,000 mg total) by mouth every 12 (twelve) hours. 60 tablet 0  . fluticasone (FLONASE) 50 MCG/ACT nasal spray 1 spray by Each Nare route daily.    . Fluticasone-Salmeterol (ADVAIR DISKUS) 500-50 MCG/DOSE AEPB Inhale 1 puff into the lungs 2 (two) times daily. 1 each 3  . ipratropium-albuterol (DUONEB) 0.5-2.5 (3) MG/3ML SOLN Take 3 mLs by nebulization every 4 (four) hours as needed. 360 mL 3  . loratadine (CLARITIN) 10 MG tablet Take 10 mg by mouth daily as needed.     . Multiple Vitamin (MULTIVITAMIN) tablet Take 1 tablet by mouth daily.    Marland Kitchen omeprazole (PRILOSEC) 20 MG capsule Take 20 mg by mouth daily.    . risperiDONE (RISPERDAL) 1 MG tablet Take 1 tablet (1 mg total) by mouth at bedtime. 30 tablet 0  . salmeterol (SEREVENT) 50 MCG/DOSE diskus inhaler Inhale 1 puff into the lungs 2 (two) times daily.    . sucralfate (CARAFATE) 1 g tablet Take 1 tablet (1 g total) by mouth 4 (four) times  daily. Dissolve in 2-3 tbsp warm water, swish and swallow. 120 tablet 3  . tiotropium (SPIRIVA HANDIHALER) 18 MCG inhalation capsule Place 18 mcg into inhaler and inhale daily.    . montelukast (SINGULAIR) 10 MG tablet Take 1 tablet (10 mg total) by mouth at bedtime. 60 tablet 3   No current facility-administered medications for this visit.     PHYSICAL EXAMINATION: ECOG PERFORMANCE STATUS: 1 - Symptomatic but completely ambulatory  There were no vitals taken for this visit.  There were no vitals filed for this visit.  GENERAL: Well-nourished well-developed; Alert, no distress and comfortable.  Accompanied by her brother. EYES: no pallor or icterus OROPHARYNX: no thrush or ulceration; NECK: supple; no lymph nodes felt. LYMPH:  no palpable lymphadenopathy in the axillary or inguinal regions LUNGS: Decreased breath sounds auscultation bilaterally. No wheeze or crackles HEART/CVS: regular rate &  rhythm and no murmurs; No lower extremity edema ABDOMEN:abdomen soft, non-tender and normal bowel sounds. No hepatomegaly or splenomegaly.  Musculoskeletal:no cyanosis of digits and no clubbing  PSYCH: alert & oriented x 3 with fluent speech NEURO: no focal motor/sensory deficits SKIN:  no rashes or significant lesions    LABORATORY DATA:  I have reviewed the data as listed    Component Value Date/Time   NA 136 11/06/2017 1403   K 4.4 11/06/2017 1403   CL 102 11/06/2017 1403   CO2 26 11/06/2017 1403   GLUCOSE 102 (H) 11/06/2017 1403   BUN 11 11/06/2017 1403   CREATININE 0.65 11/06/2017 1403   CALCIUM 8.8 (L) 11/06/2017 1403   PROT 6.0 (L) 11/06/2017 1403   ALBUMIN 3.0 (L) 11/06/2017 1403   AST 17 11/06/2017 1403   ALT 8 (L) 11/06/2017 1403   ALKPHOS 66 11/06/2017 1403   BILITOT 0.3 11/06/2017 1403   GFRNONAA >60 11/06/2017 1403   GFRAA >60 11/06/2017 1403    No results found for: SPEP, UPEP  Lab Results  Component Value Date   WBC 7.5 11/06/2017   NEUTROABS 5.1 11/06/2017    HGB 11.7 (L) 11/06/2017   HCT 34.1 (L) 11/06/2017   MCV 95.3 11/06/2017   PLT 225 11/06/2017      Chemistry      Component Value Date/Time   NA 136 11/06/2017 1403   K 4.4 11/06/2017 1403   CL 102 11/06/2017 1403   CO2 26 11/06/2017 1403   BUN 11 11/06/2017 1403   CREATININE 0.65 11/06/2017 1403      Component Value Date/Time   CALCIUM 8.8 (L) 11/06/2017 1403   ALKPHOS 66 11/06/2017 1403   AST 17 11/06/2017 1403   ALT 8 (L) 11/06/2017 1403   BILITOT 0.3 11/06/2017 1403       RADIOGRAPHIC STUDIES: I have personally reviewed the radiological images as listed and agreed with the findings in the report. No results found.   ASSESSMENT & PLAN:  Primary cancer of right lower lobe of lung ([place]) #Limited stage small cell lung cancer-status post chemoradiation [April 2017]; most recent CT scan January 2019; negative for recurrence/question pneumonia.  Stable  #Spoke to insurance this morning; approved CT of the chest-we will schedule ASAP to rule out any recurrence.  #Chronic fatigue/dizziness-secondary to whole brain radiation.   #Chronic cough COPD-stable  # Chronic headaches; worse in the last few days.  Discussed my concerns of metastatic disease to the brain.  For now recommend continue Claritin/add Singulair.  If not better to inform us; we will plan to get MRI of the brain.  CT scan asap; Follow TBD. Call [phone] cell.     No orders of the defined types were placed in this encounter.  All questions were answered. The patient knows to call the clinic with any problems, questions or concerns.      Cammie Sickle, MD 11/06/2017 5:19 PM

## 2017-11-19 ENCOUNTER — Ambulatory Visit
Admission: RE | Admit: 2017-11-19 | Discharge: 2017-11-19 | Disposition: A | Discharge status: Home / Self Care | Payer: Medicaid Other | Source: Ambulatory Visit | Attending: Internal Medicine | Admitting: Internal Medicine

## 2017-11-19 DIAGNOSIS — J439 Emphysema, unspecified: Secondary | ICD-10-CM | POA: Diagnosis not present

## 2017-11-19 DIAGNOSIS — Y842 Radiological procedure and radiotherapy as the cause of abnormal reaction of the patient, or of later complication, without mention of misadventure at the time of the procedure: Secondary | ICD-10-CM | POA: Diagnosis not present

## 2017-11-19 DIAGNOSIS — Z08 Encounter for follow-up examination after completed treatment for malignant neoplasm: Secondary | ICD-10-CM | POA: Insufficient documentation

## 2017-11-19 DIAGNOSIS — C3431 Malignant neoplasm of lower lobe, right bronchus or lung: Secondary | ICD-10-CM | POA: Diagnosis present

## 2017-11-19 MED ORDER — IOHEXOL 300 MG/ML  SOLN
75.0000 mL | Freq: Once | INTRAMUSCULAR | Status: AC | PRN
  Administered 2017-11-19: 75 mL via INTRAVENOUS

## 2017-11-20 ENCOUNTER — Ambulatory Visit: Admission: RE | Admit: 2017-11-20 | Payer: Medicaid Other | Source: Ambulatory Visit

## 2017-12-04 IMAGING — CT CT HEAD WO/W CM
1 of 2 series · 13 of 30 positions shown, 17 images · IV contrast (omnipaque)
Comparison: None.

CLINICAL DATA: Small-cell lung cancer.  Initial staging.

EXAM:
CT HEAD WITHOUT AND WITH CONTRAST
TECHNIQUE: Contiguous axial images were obtained from the base of the skull
through the vertex without and with intravenous contrast
CONTRAST:  75 mL Omnipaque 300

[Series 2: head wo · axial · 0.39mm/px · z∈[-128,-11]mm · 13 of 32 slices shown, 17 images]
[im 3/32  brain]
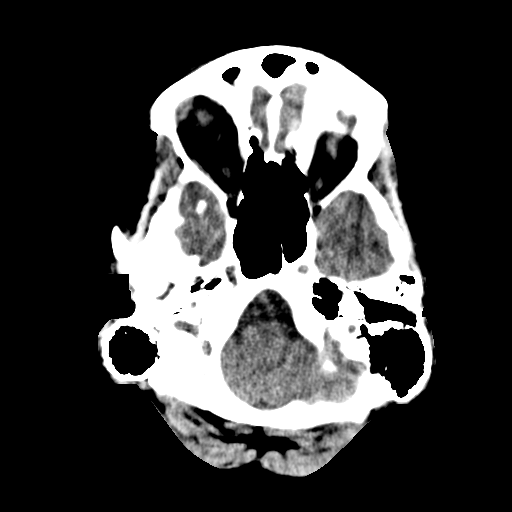
[im 3/32  bone]
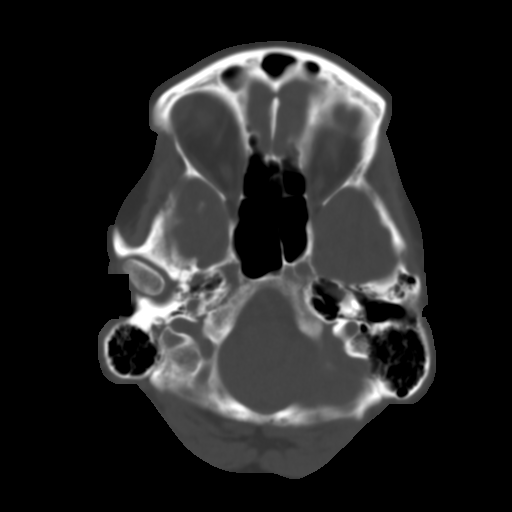
[im 5/32  brain]
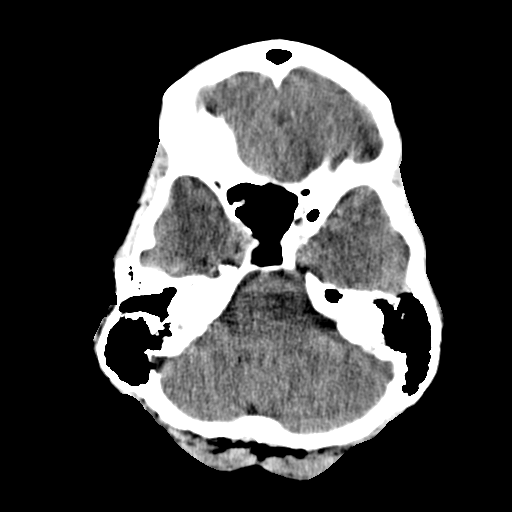
[im 7/32  brain]
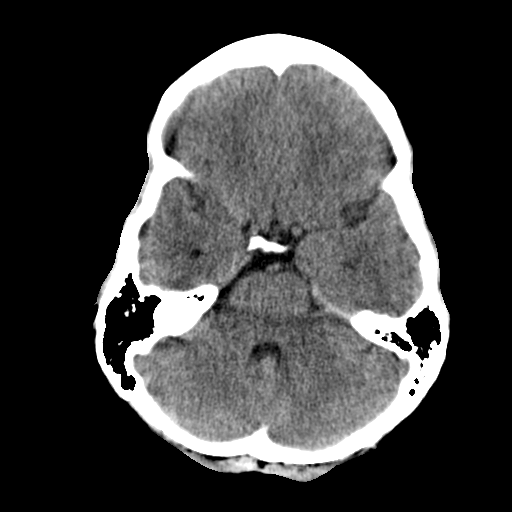
[im 9/32  brain]
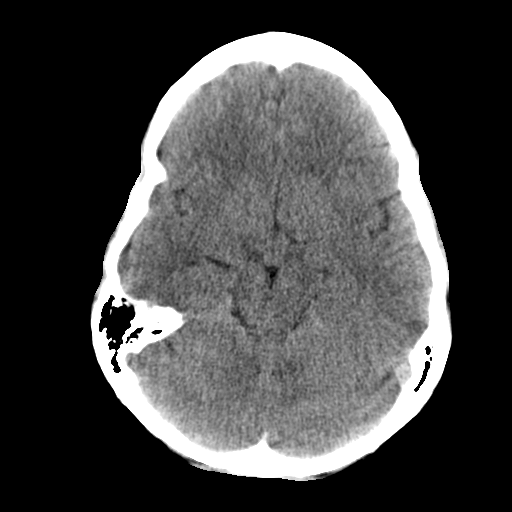
[im 12/32  brain]
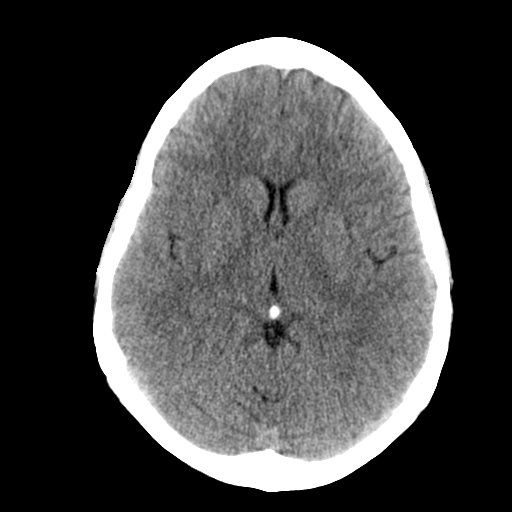
[im 12/32  bone]
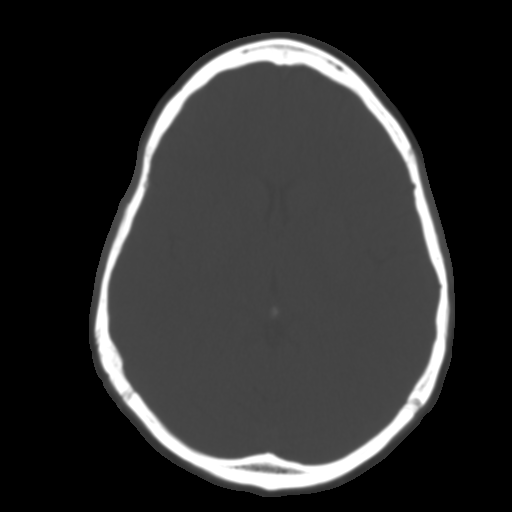
[im 14/32  brain]
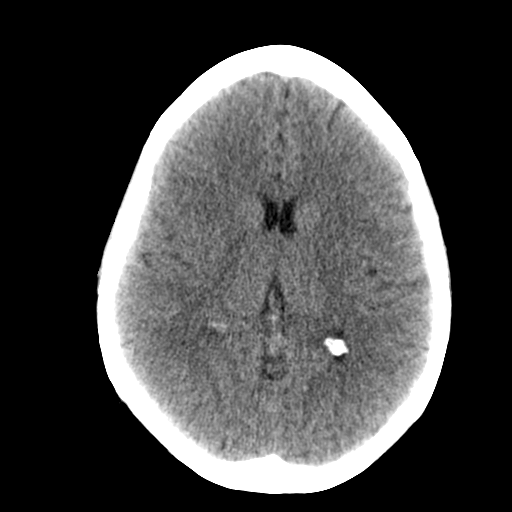
[im 16/32  brain]
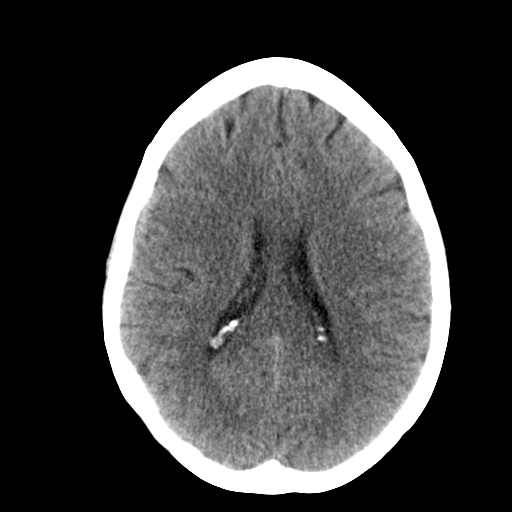
[im 18/32  brain]
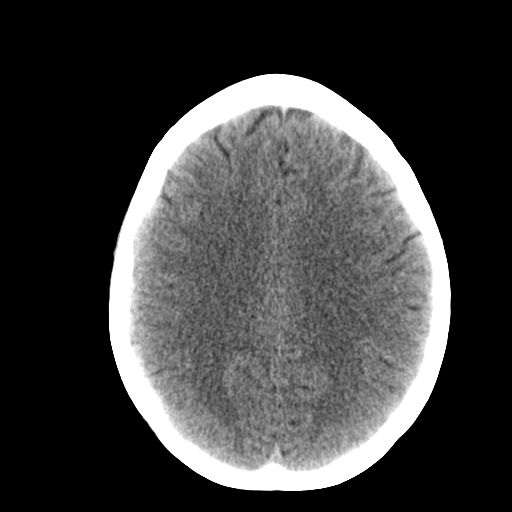
[im 20/32  brain]
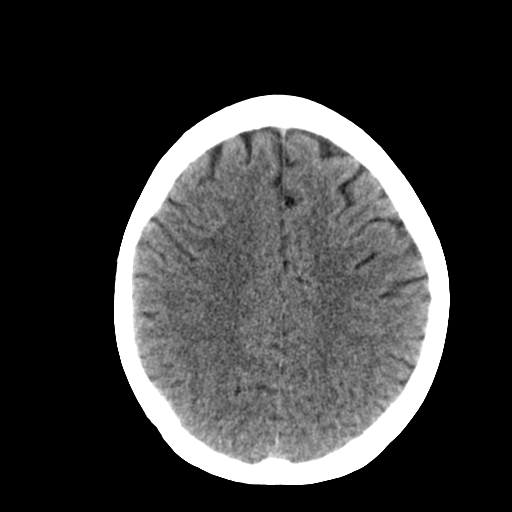
[im 20/32  bone]
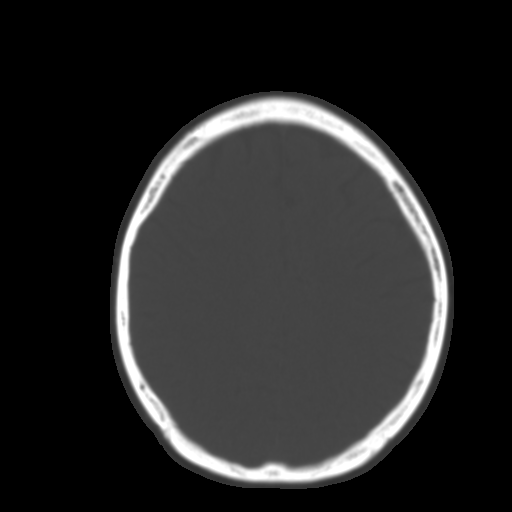
[im 23/32  brain]
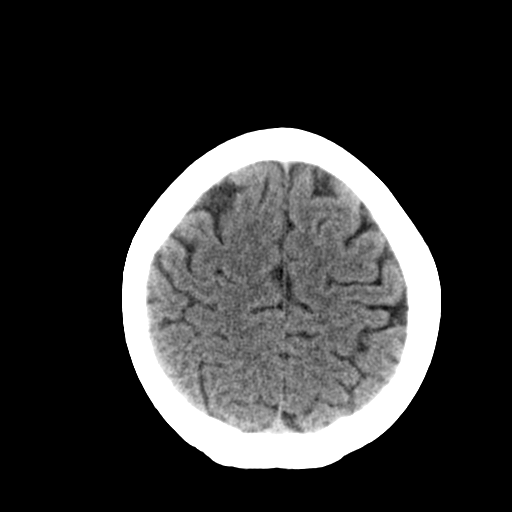
[im 25/32  brain]
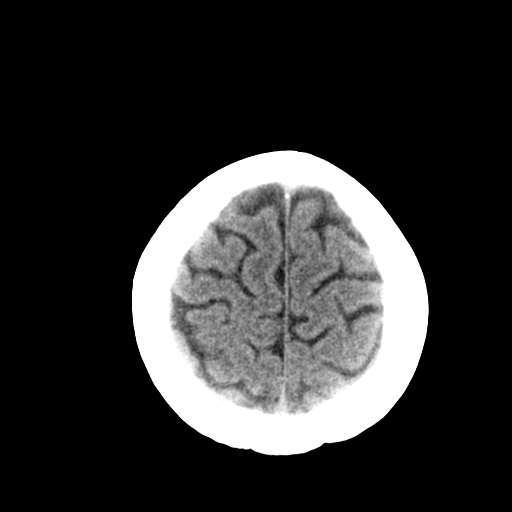
[im 27/32  brain]
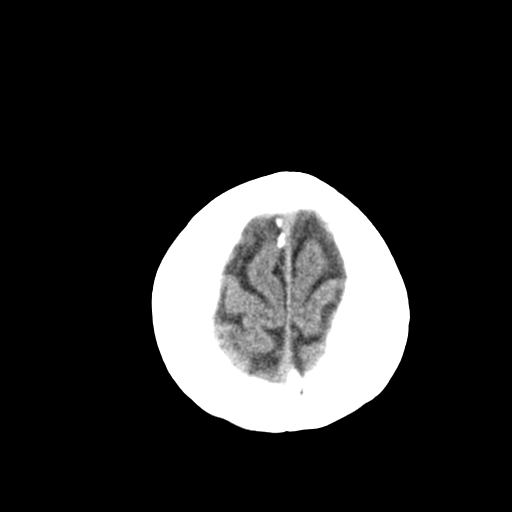
[im 29/32  brain]
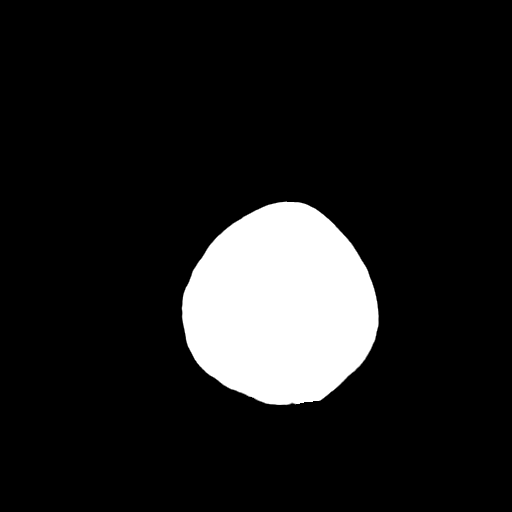
[im 29/32  bone]
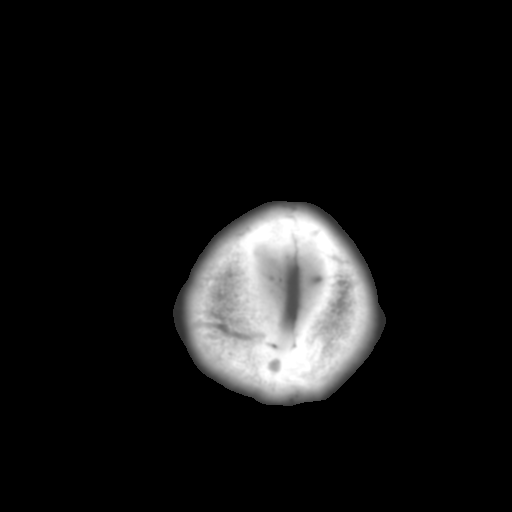

[13 of 30 positions shown; findings below may reference images not displayed]

FINDINGS: The ventricles and sulci are within normal limits for age. There is
no evidence of acute infarct, intracranial hemorrhage, mass, midline
shift, or extra-axial collection. A small developmental venous
anomaly is questioned in the anterior right frontal lobe. No
masslike enhancement is seen. Major dural venous sinuses appear
patent.

The visualized orbits are unremarkable. A left ethmoid sinus osteoma
is partially visualized. The visualized mastoid air cells are clear.
No suspicious skull lesion is identified.
IMPRESSION: No evidence of intracranial metastases.

## 2017-12-04 IMAGING — CT NM PET TUM IMG INITIAL (PI) SKULL BASE T - THIGH
1 of 10 series · 1 of 25 positions shown · non-contrast
Comparison: None.

CLINICAL DATA: Initial treatment strategy for small cell lung
cancer.

EXAM:
NUCLEAR MEDICINE PET SKULL BASE TO THIGH
TECHNIQUE: 12.0 mCi F-18 FDG was injected intravenously. Full-ring PET imaging
was performed from the skull base to thigh after the radiotracer. CT
data was obtained and used for attenuation correction and anatomic
localization.
FASTING BLOOD GLUCOSE:  Value: 74 mg/dl

[Series 3: ct wb 5.0 b30f · axial · 5.0mm · 0.98mm/px · 1 of 290 slices shown]
[im 290/290  brain]
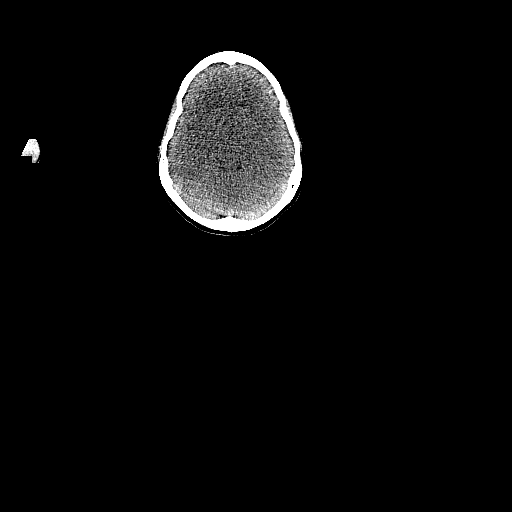

[1 of 25 positions shown; findings below may reference images not displayed]

FINDINGS: NECK

No hypermetabolic lymph nodes in the neck.

CHEST

[DATE] x 3.7 cm right lower lobe mass (series 3/ image 110) with
adjacent segmental nodularity (series 3/images 102 and 106), max SUV
6.9, likely corresponding to known primary bronchogenic neoplasm.

Additional 2.1 x 2.6 cm right infrahilar node (series 3/image 99),
max SUV 13.0.

Underlying mild to moderate centrilobular and paraseptal
emphysematous changes. No pleural effusion or pneumothorax.

The heart is normal in size.  No pericardial effusion.

ABDOMEN/PELVIS

No abnormal hypermetabolic activity within the liver, pancreas,
adrenal glands, or spleen.

Status post gastric bypass with gastrojejunostomy. Mild long segment
wall thickening involving areas of the descending colon (series
3/image 154) and sigmoid colon (series 3/image 229), but without
discrete mass. Associated long segment hypermetabolism.

No hypermetabolic lymph nodes in the abdomen or pelvis.

SKELETON

No focal hypermetabolic activity to suggest skeletal metastasis.
IMPRESSION: 1.8 x 3.7 cm right lower lobe mass with adjacent nodularity,
corresponding to known primary bronchogenic neoplasm PE

Associated 2.1 x 2.6 cm right infrahilar nodal metastasis.

## 2017-12-06 ENCOUNTER — Telehealth: Payer: Self-pay | Admitting: Internal Medicine

## 2017-12-06 DIAGNOSIS — C3431 Malignant neoplasm of lower lobe, right bronchus or lung: Secondary | ICD-10-CM

## 2017-12-06 NOTE — Telephone Encounter (Signed)
Please inform pt that CT scan shows no evidence of recurrent disease; follow up recommended in 3 months/labs-cbc/cmp. Thx. GB

## 2017-12-07 ENCOUNTER — Inpatient Hospital Stay
Admission: EM | Admit: 2017-12-07 | Discharge: 2017-12-10 | DRG: 481 | Disposition: A | Discharge status: Skilled Nursing Facility | Payer: Medicaid Other | Attending: Internal Medicine | Admitting: Internal Medicine

## 2017-12-07 ENCOUNTER — Emergency Department: Payer: Medicaid Other

## 2017-12-07 ENCOUNTER — Encounter: Payer: Self-pay | Admitting: Emergency Medicine

## 2017-12-07 ENCOUNTER — Other Ambulatory Visit: Payer: Self-pay

## 2017-12-07 DIAGNOSIS — S72009A Fracture of unspecified part of neck of unspecified femur, initial encounter for closed fracture: Secondary | ICD-10-CM | POA: Diagnosis present

## 2017-12-07 DIAGNOSIS — Z880 Allergy status to penicillin: Secondary | ICD-10-CM

## 2017-12-07 DIAGNOSIS — E871 Hypo-osmolality and hyponatremia: Secondary | ICD-10-CM | POA: Diagnosis not present

## 2017-12-07 DIAGNOSIS — F3181 Bipolar II disorder: Secondary | ICD-10-CM | POA: Diagnosis present

## 2017-12-07 DIAGNOSIS — W108XXA Fall (on) (from) other stairs and steps, initial encounter: Secondary | ICD-10-CM | POA: Diagnosis present

## 2017-12-07 DIAGNOSIS — S72011A Unspecified intracapsular fracture of right femur, initial encounter for closed fracture: Principal | ICD-10-CM | POA: Diagnosis present

## 2017-12-07 DIAGNOSIS — M25551 Pain in right hip: Secondary | ICD-10-CM | POA: Diagnosis present

## 2017-12-07 DIAGNOSIS — R000 Tachycardia, unspecified: Secondary | ICD-10-CM | POA: Diagnosis present

## 2017-12-07 DIAGNOSIS — Z882 Allergy status to sulfonamides status: Secondary | ICD-10-CM | POA: Diagnosis not present

## 2017-12-07 DIAGNOSIS — Z79899 Other long term (current) drug therapy: Secondary | ICD-10-CM

## 2017-12-07 DIAGNOSIS — Z87891 Personal history of nicotine dependence: Secondary | ICD-10-CM

## 2017-12-07 DIAGNOSIS — W19XXXA Unspecified fall, initial encounter: Secondary | ICD-10-CM

## 2017-12-07 DIAGNOSIS — M542 Cervicalgia: Secondary | ICD-10-CM | POA: Diagnosis present

## 2017-12-07 DIAGNOSIS — Z9089 Acquired absence of other organs: Secondary | ICD-10-CM

## 2017-12-07 DIAGNOSIS — Z90710 Acquired absence of both cervix and uterus: Secondary | ICD-10-CM

## 2017-12-07 DIAGNOSIS — Y92009 Unspecified place in unspecified non-institutional (private) residence as the place of occurrence of the external cause: Secondary | ICD-10-CM | POA: Diagnosis not present

## 2017-12-07 DIAGNOSIS — Z8249 Family history of ischemic heart disease and other diseases of the circulatory system: Secondary | ICD-10-CM

## 2017-12-07 DIAGNOSIS — I10 Essential (primary) hypertension: Secondary | ICD-10-CM | POA: Diagnosis present

## 2017-12-07 DIAGNOSIS — Z85118 Personal history of other malignant neoplasm of bronchus and lung: Secondary | ICD-10-CM

## 2017-12-07 DIAGNOSIS — F21 Schizotypal disorder: Secondary | ICD-10-CM | POA: Diagnosis present

## 2017-12-07 DIAGNOSIS — J439 Emphysema, unspecified: Secondary | ICD-10-CM | POA: Diagnosis present

## 2017-12-07 DIAGNOSIS — S0083XA Contusion of other part of head, initial encounter: Secondary | ICD-10-CM | POA: Diagnosis present

## 2017-12-07 DIAGNOSIS — S72001A Fracture of unspecified part of neck of right femur, initial encounter for closed fracture: Secondary | ICD-10-CM

## 2017-12-07 DIAGNOSIS — Z7951 Long term (current) use of inhaled steroids: Secondary | ICD-10-CM | POA: Diagnosis not present

## 2017-12-07 DIAGNOSIS — Z419 Encounter for procedure for purposes other than remedying health state, unspecified: Secondary | ICD-10-CM

## 2017-12-07 LAB — BASIC METABOLIC PANEL
Anion gap: 10 (ref 5–15)
BUN: 14 mg/dL (ref 6–20)
CO2: 23 mmol/L (ref 22–32)
Calcium: 8.6 mg/dL — ABNORMAL LOW (ref 8.9–10.3)
Chloride: 99 mmol/L — ABNORMAL LOW (ref 101–111)
Creatinine, Ser: 0.91 mg/dL (ref 0.44–1.00)
Glucose, Bld: 106 mg/dL — ABNORMAL HIGH (ref 65–99)
POTASSIUM: 4.4 mmol/L (ref 3.5–5.1)
SODIUM: 132 mmol/L — AB (ref 135–145)

## 2017-12-07 LAB — CBC WITH DIFFERENTIAL/PLATELET
BASOS ABS: 0 10*3/uL (ref 0–0.1)
BASOS PCT: 1 %
EOS ABS: 0.1 10*3/uL (ref 0–0.7)
EOS PCT: 1 %
HCT: 36.5 % (ref 35.0–47.0)
Hemoglobin: 12.6 g/dL (ref 12.0–16.0)
LYMPHS ABS: 1.4 10*3/uL (ref 1.0–3.6)
Lymphocytes Relative: 18 %
MCH: 32.7 pg (ref 26.0–34.0)
MCHC: 34.4 g/dL (ref 32.0–36.0)
MCV: 95.1 fL (ref 80.0–100.0)
Monocytes Absolute: 0.6 10*3/uL (ref 0.2–0.9)
Monocytes Relative: 7 %
Neutro Abs: 6 10*3/uL (ref 1.4–6.5)
Neutrophils Relative %: 73 %
PLATELETS: 210 10*3/uL (ref 150–440)
RBC: 3.84 MIL/uL (ref 3.80–5.20)
RDW: 15 % — ABNORMAL HIGH (ref 11.5–14.5)
WBC: 8 10*3/uL (ref 3.6–11.0)

## 2017-12-07 LAB — TYPE AND SCREEN
ABO/RH(D): O POS
Antibody Screen: NEGATIVE

## 2017-12-07 LAB — APTT: aPTT: 33 seconds (ref 24–36)

## 2017-12-07 LAB — PROTIME-INR
INR: 1.01
PROTHROMBIN TIME: 13.2 s (ref 11.4–15.2)

## 2017-12-07 MED ORDER — FENTANYL CITRATE (PF) 100 MCG/2ML IJ SOLN
50.0000 ug | INTRAMUSCULAR | Status: DC | PRN
  Administered 2017-12-07 (×3): 50 ug via INTRAVENOUS
  Filled 2017-12-07 (×3): qty 2

## 2017-12-07 MED ORDER — IPRATROPIUM-ALBUTEROL 0.5-2.5 (3) MG/3ML IN SOLN
3.0000 mL | Freq: Once | RESPIRATORY_TRACT | Status: AC
  Administered 2017-12-07: 3 mL via RESPIRATORY_TRACT
  Filled 2017-12-07: qty 3

## 2017-12-07 NOTE — ED Provider Notes (Signed)
[facility] Emergency Department Provider Note    First MD Initiated Contact with Patient 12/07/17 1957     (approximate)  I have reviewed the triage vital signs and the nursing notes.   HISTORY  Chief Complaint Fall    HPI Laporche Martelle Seybold is a 57 y.o. female the history of asthma, bipolar disorder as well as COPD presents the ER after mechanical fall.  States she was going up some steps and missed a step and fell down 2-3 steps landing on the right side particular on her right hip and hitting the right side of her head.  Denies any LOC but does have right-sided headache.  No numbness or tingling but does have some mild neck pain as well.  She does not wear home oxygen.  Is complaining of mild to moderate right hip pain.  No abdominal pain.    Past Medical History:  Diagnosis Date  . Asthma   . Bipolar 2 disorder ([place])   . Borderline schizophrenia ([place])   . COPD (chronic obstructive pulmonary disease) ([place])   . Falls infrequently   . Hypertension   . Primary cancer of right lower lobe of lung ([place]) 07/12/2015   Small cell undifferentiated carcinoma of lung.  Diagnosis at [facility] by fine-needle aspiration of lymph node (January, 2017)  . Ulcer    Family History  Problem Relation Age of Onset  . Hypertension Father    Past Surgical History:  Procedure Laterality Date  . ABDOMINAL HYSTERECTOMY    . APPENDECTOMY    . borderline schizophrenia    . chronic back pain    . NASAL SINUS SURGERY     x2  . PERIPHERAL VASCULAR CATHETERIZATION N/A 07/30/2015   Procedure: Glori Luis Cath Insertion;  Surgeon: Algernon Huxley, MD;  Location: [place] [facility];  Service: Cardiovascular;  Laterality: N/A;  . STOMACH SURGERY     Patient Active Problem List   Diagnosis Date Noted  . Abdominal distension 08/28/2017  . Respiratory failure ([place])   . Lethargy   . COPD exacerbation ([place]) 11/16/2016  . HCAP (healthcare-associated pneumonia) 12/31/2015  . Acute  encephalopathy 12/31/2015  . Herpes zoster 12/31/2015  . Anemia 12/31/2015  . Acute psychosis associated with endocrine, metabolic, or cerebrovascular disorder 12/21/2015  . Cocaine use disorder, moderate, dependence ([place]) 12/21/2015  . Bipolar I disorder, most recent episode manic, severe with psychotic features ([place]) 12/21/2015  . OCD (obsessive compulsive disorder) 12/21/2015  . PTSD (post-traumatic stress disorder) 12/21/2015  . Abnormal cells of cervix 07/30/2015  . Encounter for general adult medical examination without abnormal findings 07/28/2015  . Primary cancer of right lower lobe of lung ([place]) 07/12/2015  . At risk for noncompliance 06/29/2015  . Spasm 06/26/2015  . Essential (primary) hypertension 06/21/2015  . Lung mass 06/21/2015  . Candida infection of mouth 06/21/2015  . Episodic mood disorder ([place]) 10/14/2014  . Chronic obstructive pulmonary disease ([place]) 10/06/2014  . Pulmonary emphysema ([place]) 10/06/2014  . LBP (low back pain) 09/27/2014  . Tobacco use disorder 09/27/2014      Prior to Admission medications   Medication Sig Start Date End Date Taking? Authorizing Provider  acetaminophen (TYLENOL) 500 MG tablet Take 500 mg by mouth every 6 (six) hours as needed.   Yes [provider]  albuterol (PROVENTIL HFA;VENTOLIN HFA) 108 (90 Base) MCG/ACT inhaler Inhale 2 puffs into the lungs every 4 (four) hours as needed for wheezing or shortness of breath.   Yes [provider]  cyclobenzaprine (FLEXERIL) 10 MG tablet Take 10 mg by mouth 2 (two) times daily.  06/26/15  Yes [provider]  diltiazem (CARDIZEM CD) 120 MG 24 hr capsule Take 1 capsule (120 mg total) by mouth daily. 01/11/16  Yes Max Sane, MD  divalproex (DEPAKOTE) 500 MG DR tablet Take 2 tablets (1,000 mg total) by mouth every 12 (twelve) hours. 01/11/16  Yes Max Sane, MD  fluticasone (FLONASE) 50 MCG/ACT nasal spray 1 spray by Each Nare route daily. 07/03/15 08/23/66 Yes [provider]  Fluticasone-Salmeterol (ADVAIR DISKUS) 500-50 MCG/DOSE AEPB Inhale 1 puff into the lungs 2 (two) times daily. 12/26/16  Yes Cammie Sickle, MD  ipratropium-albuterol (DUONEB) 0.5-2.5 (3) MG/3ML SOLN Take 3 mLs by nebulization every 4 (four) hours as needed. 12/26/16  Yes Cammie Sickle, MD  loratadine (CLARITIN) 10 MG tablet Take 10 mg by mouth daily as needed.    Yes [provider]  Multiple Vitamin (MULTIVITAMIN) tablet Take 1 tablet by mouth daily.   Yes [provider]  omeprazole (PRILOSEC) 20 MG capsule Take 20 mg by mouth daily.   Yes [provider]  risperiDONE (RISPERDAL) 1 MG tablet Take 1 tablet (1 mg total) by mouth at bedtime. 11/24/16  Yes Mody, Ulice Bold, MD  salmeterol (SEREVENT) 50 MCG/DOSE diskus inhaler Inhale 1 puff into the lungs 2 (two) times daily. 07/20/17 07/20/18 Yes [provider]  sucralfate (CARAFATE) 1 g tablet Take 1 tablet (1 g total) by mouth 4 (four) times daily. Dissolve in 2-3 tbsp warm water, swish and swallow. 11/01/15  Yes Choksi, Delorise Shiner, MD  tiotropium (SPIRIVA HANDIHALER) 18 MCG inhalation capsule Place 18 mcg into inhaler and inhale daily.   Yes [provider]  montelukast (SINGULAIR) 10 MG tablet Take 1 tablet (10 mg total) by mouth at bedtime. Patient not taking: Reported on 12/07/2017 11/06/17   Cammie Sickle, MD    Allergies Amoxicillin-pot clavulanate and Septra [sulfamethoxazole-trimethoprim]    Social History Social History   Tobacco Use  . Smoking status: Former Smoker    Packs/day: 0.50    Years: 30.00    Pack years: 15.00    Last attempt to quit: 06/17/2015    Years since quitting: 2.4  . Smokeless tobacco: Never Used  Substance Use Topics  . Alcohol use: No    Alcohol/week: 0.0 oz  . Drug use: Yes    Types: Cocaine    Comment: States due to cocaine in mouthwash    Review of Systems Patient denies headaches, rhinorrhea, blurry vision, numbness, shortness of  breath, chest pain, edema, cough, abdominal pain, nausea, vomiting, diarrhea, dysuria, fevers, rashes or hallucinations unless otherwise stated above in HPI. ____________________________________________   PHYSICAL EXAM:  VITAL SIGNS: Vitals:   12/07/17 1957 12/07/17 2000  BP: (!) 131/94 134/87  Pulse: (!) 119 (!) 113  Resp: 18 15  Temp: 99.4 F (37.4 C)   SpO2: 92% 91%    Constitutional: Alert and oriented.  Eyes: Conjunctivae are normal.  Head: large right conusiona nd ecchymosis to right cheek, no proptosis Nose: No congestion/rhinnorhea. Mouth/Throat: Mucous membranes are moist.   Neck: No stridor. Painless ROM.  Cardiovascular: Normal rate, regular rhythm. Grossly normal heart sounds.  Good peripheral circulation. Respiratory: Normal respiratory effort.  No retractions. Lungs CTAB. Gastrointestinal: Soft and nontender. No distention. No abdominal bruits. No CVA tenderness. Genitourinary: deferred Musculoskeletal:pain with palpation of right hip without any shortening or rotational deformity. no lower extremity tenderness nor edema.  No  joint effusions. Neurologic:  Normal speech and language. No gross focal neurologic deficits are appreciated. No facial droop Skin:  Skin is warm, dry and intact. No rash noted. Psychiatric: Mood and affect are normal. Speech and behavior are normal.  ____________________________________________   LABS (all labs ordered are listed, but only abnormal results are displayed)  Results for orders placed or performed during the hospital encounter of 12/07/17 (from the past 24 hour(s))  CBC with Differential/Platelet     Status: Abnormal   Collection Time: 12/07/17  8:13 PM  Result Value Ref Range   WBC 8.0 3.6 - 11.0 K/uL   RBC 3.84 3.80 - 5.20 MIL/uL   Hemoglobin 12.6 12.0 - 16.0 g/dL   HCT 36.5 35.0 - 47.0 %   MCV 95.1 80.0 - 100.0 fL   MCH 32.7 26.0 - 34.0 pg   MCHC 34.4 32.0 - 36.0 g/dL   RDW 15.0 (H) 11.5 - 14.5 %   Platelets 210 150  - 440 K/uL   Neutrophils Relative % 73 %   Neutro Abs 6.0 1.4 - 6.5 K/uL   Lymphocytes Relative 18 %   Lymphs Abs 1.4 1.0 - 3.6 K/uL   Monocytes Relative 7 %   Monocytes Absolute 0.6 0.2 - 0.9 K/uL   Eosinophils Relative 1 %   Eosinophils Absolute 0.1 0 - 0.7 K/uL   Basophils Relative 1 %   Basophils Absolute 0.0 0 - 0.1 K/uL  Basic metabolic panel     Status: Abnormal   Collection Time: 12/07/17  8:13 PM  Result Value Ref Range   Sodium 132 (L) 135 - 145 mmol/L   Potassium 4.4 3.5 - 5.1 mmol/L   Chloride 99 (L) 101 - 111 mmol/L   CO2 23 22 - 32 mmol/L   Glucose, Bld 106 (H) 65 - 99 mg/dL   BUN 14 6 - 20 mg/dL   Creatinine, Ser 0.91 0.44 - 1.00 mg/dL   Calcium 8.6 (L) 8.9 - 10.3 mg/dL   GFR calc non Af Amer >60 >60 mL/min   GFR calc Af Amer >60 >60 mL/min   Anion gap 10 5 - 15  Protime-INR     Status: None   Collection Time: 12/07/17  8:13 PM  Result Value Ref Range   Prothrombin Time 13.2 11.4 - 15.2 seconds   INR 1.01   APTT     Status: None   Collection Time: 12/07/17  8:13 PM  Result Value Ref Range   aPTT 33 24 - 36 seconds  Type and screen [place]     Status: None   Collection Time: 12/07/17  8:38 PM  Result Value Ref Range   ABO/RH(D) O POS    Antibody Screen NEG    Sample Expiration      12/10/2017 Performed at [facility], [address], [place], [place] [address]    ____________________________________________  EKG My review and personal interpretation at Time: 19:57   Indication: fall  Rate: 120  Rhythm: sinus Axis: normal Other: nonspecific st abn, no stemi ____________________________________________  RADIOLOGY  I personally reviewed all radiographic images ordered to evaluate for the above acute complaints and reviewed radiology reports and findings.  These findings were personally discussed with the patient.  Please see medical record for radiology  report.  ____________________________________________   PROCEDURES  Procedure(s) performed:  Procedures    Critical Care performed: no ____________________________________________   INITIAL IMPRESSION / ASSESSMENT AND PLAN / ED COURSE  Pertinent labs & imaging  results that were available during my care of the patient were reviewed by me and considered in my medical decision making (see chart for details).   DDX: sah, sdh, edh, fracture, contusion, soft tissue injury, viscous injury, concussion, hemorrhage   Raziah L Hargrove is a 57 y.o. who presents to the ED with chemical fall with head injury and right hip pain as described above.  CT and radiographs ordered to evaluate for traumatic injury.  No evidence of intracranial injury or max face trauma.  C-spine without any evidence of fracture.  There is evidence of minimally displaced right femoral neck fracture.  Discussed case with orthopedics who agrees with admission for operative fixation.  Patient given IV pain medications.  Also given nebulizers she does have an underlying history of COPD and was found to be mildly hypoxic requiring supplemental oxygen.  No significant wheeze therefore I do suspect a large component of this is atelectasis given pain medications.  Patient will be admitted for further medical management.     As part of my medical decision making, I reviewed the following data within the [facility] notes reviewed and incorporated, Labs reviewed, notes from prior ED visits.   ____________________________________________   FINAL CLINICAL IMPRESSION(S) / ED DIAGNOSES  Final diagnoses:  Closed fracture of neck of right femur, initial encounter ([place])  Fall, initial encounter  Contusion of face, initial encounter      NEW MEDICATIONS STARTED DURING THIS VISIT:  New Prescriptions   No medications on file     Note:  This document was prepared using Dragon voice recognition software  and may include unintentional dictation errors.    Merlyn Lot, MD 12/07/17 2245

## 2017-12-07 NOTE — H&P (Signed)
[facility] [place] NAME: Charlene [name]    MR#:  [id number]  DATE OF BIRTH:  24-Aug-1960  DATE OF ADMISSION:  12/07/2017  PRIMARY CARE PHYSICIAN: Sison, Adele Dan, MD   REQUESTING/REFERRING PHYSICIAN:   CHIEF COMPLAINT:   Chief Complaint  Patient presents with  . Fall    HISTORY OF PRESENT ILLNESS: Charlene [name]  is a 57 y.o. female with a known history of asthma, bipolar ds, COPD, HTN. Patient was brought to emergency room status post mechanical fall.  She was going up the stairs and missed a step.  Patient went face down, on her right side, on the concrete.  She complains of severe pain to the right side of the face and right hip.  She has been unable to get up and ambulate since the fall. Patient denies any chest pain with exertion, she is not on home oxygen.  She is normally able to ambulate up to 100 feet without any chest pain or shortness of breath. Blood test done in emergency room, including CBC and CMP are grossly unremarkable.  EKG shows sinus tachycardia without any acute ischemic changes. CT of the head and neck shows large facial hematoma, but no acute fractures.  Pelvic x-ray, reviewed by myself, shows minimally displaced fracture of the right femoral neck.  No acute cardiopulmonary process, per chest x-ray. Patient is admitted for surgical repair of the right hip fracture.  PAST MEDICAL HISTORY:   Past Medical History:  Diagnosis Date  . Asthma   . Bipolar 2 disorder ([place])   . Borderline schizophrenia ([place])   . COPD (chronic obstructive pulmonary disease) ([place])   . Falls infrequently   . Hypertension   . Primary cancer of right lower lobe of lung ([place]) 07/12/2015   Small cell undifferentiated carcinoma of lung.  Diagnosis at [facility] by fine-needle aspiration of lymph node (January, 2017)  . Ulcer     PAST SURGICAL HISTORY:  Past Surgical History:  Procedure Laterality Date  . ABDOMINAL HYSTERECTOMY     . APPENDECTOMY    . borderline schizophrenia    . chronic back pain    . NASAL SINUS SURGERY     x2  . PERIPHERAL VASCULAR CATHETERIZATION N/A 07/30/2015   Procedure: Glori Luis Cath Insertion;  Surgeon: Algernon Huxley, MD;  Location: [facility];  Service: Cardiovascular;  Laterality: N/A;  . STOMACH SURGERY      SOCIAL HISTORY:  Social History   Tobacco Use  . Smoking status: Former Smoker    Packs/day: 0.50    Years: 30.00    Pack years: 15.00    Last attempt to quit: 06/17/2015    Years since quitting: 2.4  . Smokeless tobacco: Never Used  Substance Use Topics  . Alcohol use: No    Alcohol/week: 0.0 oz    FAMILY HISTORY:  Family History  Problem Relation Age of Onset  . Hypertension Father     DRUG ALLERGIES:  Allergies  Allergen Reactions  . Amoxicillin-Pot Clavulanate Hives    Also vomiting  . Septra [Sulfamethoxazole-Trimethoprim] Hives and Nausea And Vomiting    REVIEW OF SYSTEMS:   CONSTITUTIONAL: No fever, fatigue or weakness.  EYES: No blurred or double vision.  EARS, NOSE, AND THROAT: No tinnitus or ear pain.  RESPIRATORY: No cough, shortness of breath, wheezing or hemoptysis.  CARDIOVASCULAR: No chest pain, orthopnea, edema.  GASTROINTESTINAL: No nausea, vomiting, diarrhea or abdominal pain.  GENITOURINARY: No dysuria,  hematuria.  ENDOCRINE: No polyuria, nocturia,  HEMATOLOGY: No anemia, easy bruising or bleeding SKIN: No rash or lesion. MUSCULOSKELETAL: Positive for right hip pain and right-sided face pain.   NEUROLOGIC: No tingling, numbness, weakness.  PSYCHIATRY: Positive history of bipolar disorder and schizophrenia.   MEDICATIONS AT HOME:  Prior to Admission medications   Medication Sig Start Date End Date Taking? Authorizing Provider  acetaminophen (TYLENOL) 500 MG tablet Take 500 mg by mouth every 6 (six) hours as needed.   Yes [provider]  albuterol (PROVENTIL HFA;VENTOLIN HFA) 108 (90 Base) MCG/ACT inhaler Inhale 2 puffs  into the lungs every 4 (four) hours as needed for wheezing or shortness of breath.   Yes [provider]  cyclobenzaprine (FLEXERIL) 10 MG tablet Take 10 mg by mouth 2 (two) times daily.  06/26/15  Yes [provider]  diltiazem (CARDIZEM CD) 120 MG 24 hr capsule Take 1 capsule (120 mg total) by mouth daily. 01/11/16  Yes Max Sane, MD  divalproex (DEPAKOTE) 500 MG DR tablet Take 2 tablets (1,000 mg total) by mouth every 12 (twelve) hours. 01/11/16  Yes Max Sane, MD  fluticasone (FLONASE) 50 MCG/ACT nasal spray 1 spray by Each Nare route daily. 07/03/15 08/23/66 Yes [provider]  Fluticasone-Salmeterol (ADVAIR DISKUS) 500-50 MCG/DOSE AEPB Inhale 1 puff into the lungs 2 (two) times daily. 12/26/16  Yes Cammie Sickle, MD  ipratropium-albuterol (DUONEB) 0.5-2.5 (3) MG/3ML SOLN Take 3 mLs by nebulization every 4 (four) hours as needed. 12/26/16  Yes Cammie Sickle, MD  loratadine (CLARITIN) 10 MG tablet Take 10 mg by mouth daily as needed.    Yes [provider]  Multiple Vitamin (MULTIVITAMIN) tablet Take 1 tablet by mouth daily.   Yes [provider]  omeprazole (PRILOSEC) 20 MG capsule Take 20 mg by mouth daily.   Yes [provider]  risperiDONE (RISPERDAL) 1 MG tablet Take 1 tablet (1 mg total) by mouth at bedtime. 11/24/16  Yes Mody, Ulice Bold, MD  salmeterol (SEREVENT) 50 MCG/DOSE diskus inhaler Inhale 1 puff into the lungs 2 (two) times daily. 07/20/17 07/20/18 Yes [provider]  sucralfate (CARAFATE) 1 g tablet Take 1 tablet (1 g total) by mouth 4 (four) times daily. Dissolve in 2-3 tbsp warm water, swish and swallow. 11/01/15  Yes Choksi, Delorise Shiner, MD  tiotropium (SPIRIVA HANDIHALER) 18 MCG inhalation capsule Place 18 mcg into inhaler and inhale daily.   Yes [provider]  montelukast (SINGULAIR) 10 MG tablet Take 1 tablet (10 mg total) by mouth at bedtime. Patient not taking: Reported on 12/07/2017 11/06/17    Cammie Sickle, MD      PHYSICAL EXAMINATION:   VITAL SIGNS: Blood pressure (!) 132/91, pulse (!) 109, temperature 99.4 F (37.4 C), temperature source Oral, resp. rate 16, height 5\' 8"  (1.727 m), weight 77.1 kg (170 lb), SpO2 95 %.  GENERAL:  57 y.o.-year-old patient lying in the bed with moderate to severe distress, secondary to pain.  EYES: Pupils equal, round, reactive to light and accommodation. No scleral icterus.   HEENT: Head atraumatic, normocephalic. Oropharynx and nasopharynx clear.  NECK:  Supple, no jugular venous distention. No thyroid enlargement, no tenderness.  LUNGS: Reduced breath sounds bilaterally, no wheezing, rales,rhonchi or crepitation. No use of accessory muscles of respiration.  CARDIOVASCULAR: S1, S2 normal. No S3/S4.  ABDOMEN: Soft, nontender, nondistended. Bowel sounds present. No organomegaly or mass.  EXTREMITIES: Right lower extremities noted to be shortened and externally rotated.  Range of motion at  the right hip joint is severely reduced due to tenderness. NEUROLOGIC: No focal weakness. PSYCHIATRIC: The patient is alert and oriented x 3.  SKIN: Right facial hematoma, extending all the way to right periorbital area noted.   LABORATORY PANEL:   CBC Recent Labs  Lab 12/07/17 2013  WBC 8.0  HGB 12.6  HCT 36.5  PLT 210  MCV 95.1  MCH 32.7  MCHC 34.4  RDW 15.0*  LYMPHSABS 1.4  MONOABS 0.6  EOSABS 0.1  BASOSABS 0.0   ------------------------------------------------------------------------------------------------------------------  Chemistries  Recent Labs  Lab 12/07/17 2013  NA 132*  K 4.4  CL 99*  CO2 23  GLUCOSE 106*  BUN 14  CREATININE 0.91  CALCIUM 8.6*   ------------------------------------------------------------------------------------------------------------------ estimated creatinine clearance is 75.4 mL/min (by C-G formula based on SCr of 0.91  mg/dL). ------------------------------------------------------------------------------------------------------------------ No results for input(s): TSH, T4TOTAL, T3FREE, THYROIDAB in the last 72 hours.  Invalid input(s): FREET3   Coagulation profile Recent Labs  Lab 12/07/17 2013  INR 1.01   ------------------------------------------------------------------------------------------------------------------- No results for input(s): DDIMER in the last 72 hours. -------------------------------------------------------------------------------------------------------------------  Cardiac Enzymes No results for input(s): CKMB, TROPONINI, MYOGLOBIN in the last 168 hours.  Invalid input(s): CK ------------------------------------------------------------------------------------------------------------------ Invalid input(s): POCBNP  ---------------------------------------------------------------------------------------------------------------  Urinalysis    Component Value Date/Time   COLORURINE STRAW (A) 11/16/2016 1419   APPEARANCEUR CLEAR (A) 11/16/2016 1419   LABSPEC 1.002 (L) 11/16/2016 1419   PHURINE 6.0 11/16/2016 1419   GLUCOSEU NEGATIVE 11/16/2016 1419   HGBUR NEGATIVE 11/16/2016 1419   BILIRUBINUR NEGATIVE 11/16/2016 1419   KETONESUR NEGATIVE 11/16/2016 1419   PROTEINUR NEGATIVE 11/16/2016 1419   NITRITE NEGATIVE 11/16/2016 1419   LEUKOCYTESUR NEGATIVE 11/16/2016 1419     RADIOLOGY: Ct Head Wo Contrast  Result Date: 12/07/2017 CLINICAL DATA:  Fall down stairs.  Right facial pain. EXAM: CT HEAD WITHOUT CONTRAST CT MAXILLOFACIAL WITHOUT CONTRAST CT CERVICAL SPINE WITHOUT CONTRAST TECHNIQUE: Multidetector CT imaging of the head, cervical spine, and maxillofacial structures were performed using the standard protocol without intravenous contrast. Multiplanar CT image reconstructions of the cervical spine and maxillofacial structures were also generated. COMPARISON:  Head CT  12/31/2015 FINDINGS: CT HEAD FINDINGS Brain: There is no mass, hemorrhage or extra-axial collection. The size and configuration of the ventricles and extra-axial CSF spaces are normal. There is no acute or chronic infarction. There is hypoattenuation of the periventricular white matter, most commonly indicating chronic ischemic microangiopathy. Vascular: No abnormal hyperdensity of the major intracranial arteries or dural venous sinuses. No intracranial atherosclerosis. Skull: The visualized skull base, calvarium and extracranial soft tissues are normal. CT MAXILLOFACIAL FINDINGS Osseous: --Complex facial fracture types: No LeFort, zygomaticomaxillary complex or nasoorbitoethmoidal fracture. --Simple fracture types: None. --Mandible: No fracture or dislocation. Orbits: The globes are intact. Normal appearance of the intra- and extraconal fat. Symmetric extraocular muscles and optic nerves. Sinuses: Chronic right maxillary sinusitis. Soft tissues: Right facial hematoma and subcutaneous edema overlying the zygomatic arch. CT CERVICAL SPINE FINDINGS Alignment: No static subluxation. Facets are aligned. Occipital condyles and the lateral masses of C1-C2 are aligned. Skull base and vertebrae: No acute fracture. Soft tissues and spinal canal: No prevertebral fluid or swelling. No visible canal hematoma. Disc levels: Disc space narrowing is greatest at C5-C6. Upper chest: No pneumothorax, pulmonary nodule or pleural effusion. Other: Normal visualized paraspinal cervical soft tissues. IMPRESSION: 1. Advanced sequelae of chronic ischemic microangiopathy without acute intracranial abnormality. Chronic ischemic white matter changes and markedly progressed compared to 12/31/2015. 2. No calvarial or facial fracture. 3. Large right  facial hematoma. 4. No acute fracture or static subluxation of the cervical spine. Electronically Signed   By: Ulyses Jarred M.D.   On: 12/07/2017 21:21   Ct Cervical Spine Wo Contrast  Result  Date: 12/07/2017 CLINICAL DATA:  Fall down stairs.  Right facial pain. EXAM: CT HEAD WITHOUT CONTRAST CT MAXILLOFACIAL WITHOUT CONTRAST CT CERVICAL SPINE WITHOUT CONTRAST TECHNIQUE: Multidetector CT imaging of the head, cervical spine, and maxillofacial structures were performed using the standard protocol without intravenous contrast. Multiplanar CT image reconstructions of the cervical spine and maxillofacial structures were also generated. COMPARISON:  Head CT 12/31/2015 FINDINGS: CT HEAD FINDINGS Brain: There is no mass, hemorrhage or extra-axial collection. The size and configuration of the ventricles and extra-axial CSF spaces are normal. There is no acute or chronic infarction. There is hypoattenuation of the periventricular white matter, most commonly indicating chronic ischemic microangiopathy. Vascular: No abnormal hyperdensity of the major intracranial arteries or dural venous sinuses. No intracranial atherosclerosis. Skull: The visualized skull base, calvarium and extracranial soft tissues are normal. CT MAXILLOFACIAL FINDINGS Osseous: --Complex facial fracture types: No LeFort, zygomaticomaxillary complex or nasoorbitoethmoidal fracture. --Simple fracture types: None. --Mandible: No fracture or dislocation. Orbits: The globes are intact. Normal appearance of the intra- and extraconal fat. Symmetric extraocular muscles and optic nerves. Sinuses: Chronic right maxillary sinusitis. Soft tissues: Right facial hematoma and subcutaneous edema overlying the zygomatic arch. CT CERVICAL SPINE FINDINGS Alignment: No static subluxation. Facets are aligned. Occipital condyles and the lateral masses of C1-C2 are aligned. Skull base and vertebrae: No acute fracture. Soft tissues and spinal canal: No prevertebral fluid or swelling. No visible canal hematoma. Disc levels: Disc space narrowing is greatest at C5-C6. Upper chest: No pneumothorax, pulmonary nodule or pleural effusion. Other: Normal visualized paraspinal  cervical soft tissues. IMPRESSION: 1. Advanced sequelae of chronic ischemic microangiopathy without acute intracranial abnormality. Chronic ischemic white matter changes and markedly progressed compared to 12/31/2015. 2. No calvarial or facial fracture. 3. Large right facial hematoma. 4. No acute fracture or static subluxation of the cervical spine. Electronically Signed   By: Ulyses Jarred M.D.   On: 12/07/2017 21:21   Dg Chest Portable 1 View  Result Date: 12/07/2017 CLINICAL DATA:  57 year old female with fall and right hip pain. History of lung cancer. EXAM: PORTABLE CHEST 1 VIEW COMPARISON:  Chest CT dated 11/19/2017 FINDINGS: Right pectoral Port-A-Cath with tip in the region of the cavoatrial junction. Patchy areas of density in the right hilar region similar to prior CT most consistent with post radiation changes. No new consolidative changes. There is no pleural effusion or pneumothorax. The cardiac silhouette is within normal limits. No acute osseous pathology. IMPRESSION: No acute cardiopulmonary process. Electronically Signed   By: Anner Crete M.D.   On: 12/07/2017 21:27   Dg Hip Unilat W Or Wo Pelvis 2-3 Views Right  Result Date: 12/07/2017 CLINICAL DATA:  57 year old female with fall and right hip pain. EXAM: DG HIP (WITH OR WITHOUT PELVIS) 2-3V RIGHT COMPARISON:  None. FINDINGS: There is a minimally displaced fracture of the right femoral neck. There is no dislocation. The bones are mildly osteopenic. Mild arthritic changes of the hips. There is degenerative changes of the lower lumbar spine. The soft tissues appear unremarkable. IMPRESSION: Minimally displaced fracture of the right femoral neck. Electronically Signed   By: Anner Crete M.D.   On: 12/07/2017 21:25   Ct Maxillofacial Wo Contrast  Result Date: 12/07/2017 CLINICAL DATA:  Fall down stairs.  Right facial pain. EXAM: CT  HEAD WITHOUT CONTRAST CT MAXILLOFACIAL WITHOUT CONTRAST CT CERVICAL SPINE WITHOUT CONTRAST TECHNIQUE:  Multidetector CT imaging of the head, cervical spine, and maxillofacial structures were performed using the standard protocol without intravenous contrast. Multiplanar CT image reconstructions of the cervical spine and maxillofacial structures were also generated. COMPARISON:  Head CT 12/31/2015 FINDINGS: CT HEAD FINDINGS Brain: There is no mass, hemorrhage or extra-axial collection. The size and configuration of the ventricles and extra-axial CSF spaces are normal. There is no acute or chronic infarction. There is hypoattenuation of the periventricular white matter, most commonly indicating chronic ischemic microangiopathy. Vascular: No abnormal hyperdensity of the major intracranial arteries or dural venous sinuses. No intracranial atherosclerosis. Skull: The visualized skull base, calvarium and extracranial soft tissues are normal. CT MAXILLOFACIAL FINDINGS Osseous: --Complex facial fracture types: No LeFort, zygomaticomaxillary complex or nasoorbitoethmoidal fracture. --Simple fracture types: None. --Mandible: No fracture or dislocation. Orbits: The globes are intact. Normal appearance of the intra- and extraconal fat. Symmetric extraocular muscles and optic nerves. Sinuses: Chronic right maxillary sinusitis. Soft tissues: Right facial hematoma and subcutaneous edema overlying the zygomatic arch. CT CERVICAL SPINE FINDINGS Alignment: No static subluxation. Facets are aligned. Occipital condyles and the lateral masses of C1-C2 are aligned. Skull base and vertebrae: No acute fracture. Soft tissues and spinal canal: No prevertebral fluid or swelling. No visible canal hematoma. Disc levels: Disc space narrowing is greatest at C5-C6. Upper chest: No pneumothorax, pulmonary nodule or pleural effusion. Other: Normal visualized paraspinal cervical soft tissues. IMPRESSION: 1. Advanced sequelae of chronic ischemic microangiopathy without acute intracranial abnormality. Chronic ischemic white matter changes and markedly  progressed compared to 12/31/2015. 2. No calvarial or facial fracture. 3. Large right facial hematoma. 4. No acute fracture or static subluxation of the cervical spine. Electronically Signed   By: Ulyses Jarred M.D.   On: 12/07/2017 21:21    EKG: Orders placed or performed during the hospital encounter of 12/07/17  . EKG 12-Lead  . EKG 12-Lead    IMPRESSION AND PLAN:  1.  Right hip fracture status post mechanical fall.  Patient is scheduled for surgical repair in a.m. We will keep patient n.p.o. and hold anticoagulants.  Continue pain control. Based on the review of patient's physical capacity, medical problems and EKG reports, she is medically clear for the above mentioned orthopedic procedure.  This patient is at low to moderate risk for postop cardiopulmonary complications. 2.  Facial hematoma, status post fall.  Continue pain control and monitor clinically closely. 3.  COPD, without acute exacerbation.  Continue home maintenance therapy. 4.  Hypertension, stable, restart home medications. 5.  Schizophrenia and bipolar disorder, stable, continue home medications.  All the records are reviewed and case discussed with ED provider. Management plans discussed with the patient, family and they are in agreement.  CODE STATUS: FULL Code Status History    Date Active Date Inactive Code Status Order ID Comments User Context   11/19/2016 1054 11/24/2016 1649 DNR [id number]  [name], MD Inpatient   11/16/2016 1612 11/19/2016 1054 Full Code [id number]  Gladstone Lighter, MD Inpatient   12/31/2015 0020 01/11/2016 1411 Full Code [id number]  Lance Coon, MD Inpatient    Questions for Most Recent Historical Code Status (Order [id number])    Question Answer Comment   In the event of cardiac or respiratory ARREST Do not call a "code blue"    In the event of cardiac or respiratory ARREST Do not perform Intubation, CPR, defibrillation or ACLS    In the event of cardiac  or respiratory ARREST Use  medication by any route, position, wound care, and other measures to relive pain and suffering. May use oxygen, suction and manual treatment of airway obstruction as needed for comfort.        TOTAL TIME TAKING CARE OF THIS PATIENT: 45 minutes.    Amelia Jo M.D on 12/07/2017 at 11:08 PM  Between 7am to 6pm - Pager - [phone]  After 6pm go to [email or web address] - password [email or web address] [facility]  Office  [phone]  CC: Primary care physician; Sison, Adele Dan, MD

## 2017-12-07 NOTE — ED Notes (Signed)
Patient transported to radiology for XR and CT scans.

## 2017-12-07 NOTE — Addendum Note (Signed)
Addended by: Sabino Gasser on: 12/07/2017 08:43 AM   Modules accepted: Orders

## 2017-12-07 NOTE — ED Notes (Signed)
Pt placed on 2L O2 for RA sat of 89-91%. Hx COPD and lung CA.

## 2017-12-07 NOTE — Telephone Encounter (Signed)
Patient notified of these results and verbalized understanding. Appt has been scheduled for 3 month follow up.

## 2017-12-07 NOTE — ED Triage Notes (Signed)
Pt presents to ED via [facility] from home c/o mechanical fall. Pt states she was going up some concrete steps and missed a step. Fell down 2-3 steps landing on R side on concrete landing pad. C/o pain to R hip and R face. Large hematoma to R cheek. R hip tender to palpation. Scattered bruises and abrasions noted.

## 2017-12-08 ENCOUNTER — Inpatient Hospital Stay: Payer: Medicaid Other

## 2017-12-08 ENCOUNTER — Inpatient Hospital Stay: Payer: Medicaid Other | Admitting: Anesthesiology

## 2017-12-08 ENCOUNTER — Encounter
Admission: EM | Disposition: A | Discharge status: Skilled Nursing Facility | Payer: Self-pay | Source: Home / Self Care | Attending: Internal Medicine

## 2017-12-08 HISTORY — PX: HIP PINNING,CANNULATED: SHX1758

## 2017-12-08 LAB — URINALYSIS, COMPLETE (UACMP) WITH MICROSCOPIC
BILIRUBIN URINE: NEGATIVE
Bacteria, UA: NONE SEEN
GLUCOSE, UA: NEGATIVE mg/dL
HGB URINE DIPSTICK: NEGATIVE
KETONES UR: NEGATIVE mg/dL
LEUKOCYTES UA: NEGATIVE
Nitrite: NEGATIVE
PH: 8 (ref 5.0–8.0)
PROTEIN: NEGATIVE mg/dL
Specific Gravity, Urine: 1.011 (ref 1.005–1.030)
Squamous Epithelial / LPF: NONE SEEN (ref 0–5)

## 2017-12-08 LAB — BASIC METABOLIC PANEL
Anion gap: 8 (ref 5–15)
BUN: 12 mg/dL (ref 6–20)
CHLORIDE: 99 mmol/L (ref 98–111)
CO2: 27 mmol/L (ref 22–32)
Calcium: 8.7 mg/dL — ABNORMAL LOW (ref 8.9–10.3)
Creatinine, Ser: 0.66 mg/dL (ref 0.44–1.00)
GFR calc Af Amer: >60 mL/min (ref >60)
GFR calc non Af Amer: >60 mL/min (ref >60)
GLUCOSE: 110 mg/dL — AB (ref 70–99)
Potassium: 4.1 mmol/L (ref 3.5–5.1)
Sodium: 134 mmol/L — ABNORMAL LOW (ref 135–145)

## 2017-12-08 LAB — CBC
HEMATOCRIT: 35.7 % (ref 35.0–47.0)
Hemoglobin: 12.1 g/dL (ref 12.0–16.0)
MCH: 32.3 pg (ref 26.0–34.0)
MCHC: 33.9 g/dL (ref 32.0–36.0)
MCV: 95.1 fL (ref 80.0–100.0)
Platelets: 202 10*3/uL (ref 150–440)
RBC: 3.75 MIL/uL — ABNORMAL LOW (ref 3.80–5.20)
RDW: 14.9 % — AB (ref 11.5–14.5)
WBC: 11 10*3/uL (ref 3.6–11.0)

## 2017-12-08 LAB — SURGICAL PCR SCREEN
MRSA, PCR: POSITIVE — AB
Staphylococcus aureus: POSITIVE — AB

## 2017-12-08 LAB — GLUCOSE, CAPILLARY: Glucose-Capillary: 101 mg/dL — ABNORMAL HIGH (ref 70–99)

## 2017-12-08 SURGERY — FIXATION, FEMUR, NECK, PERCUTANEOUS, USING SCREW
Anesthesia: Spinal | Site: Hip | Laterality: Right | Wound class: Clean

## 2017-12-08 MED ORDER — NEOMYCIN-POLYMYXIN B GU 40-200000 IR SOLN
Status: DC | PRN
  Administered 2017-12-08: 4 mL

## 2017-12-08 MED ORDER — VANCOMYCIN HCL IN DEXTROSE 1-5 GM/200ML-% IV SOLN: 1000.0000 mg | Freq: Once | INTRAVENOUS | Status: DC

## 2017-12-08 MED ORDER — CHLORHEXIDINE GLUCONATE CLOTH 2 % EX PADS: 6.0000 | MEDICATED_PAD | Freq: Every day | CUTANEOUS | Status: DC

## 2017-12-08 MED ORDER — ONDANSETRON HCL 4 MG/2ML IJ SOLN: 4.0000 mg | Freq: Four times a day (QID) | INTRAMUSCULAR | Status: DC | PRN

## 2017-12-08 MED ORDER — ARFORMOTEROL TARTRATE 15 MCG/2ML IN NEBU
15.0000 ug | INHALATION_SOLUTION | Freq: Two times a day (BID) | RESPIRATORY_TRACT | Status: DC
  Administered 2017-12-08: 15 ug via RESPIRATORY_TRACT
  Filled 2017-12-08 (×3): qty 2

## 2017-12-08 MED ORDER — PROMETHAZINE HCL 25 MG/ML IJ SOLN: 6.2500 mg | INTRAMUSCULAR | Status: DC | PRN

## 2017-12-08 MED ORDER — HYDROMORPHONE HCL 1 MG/ML IJ SOLN
0.5000 mg | INTRAMUSCULAR | Status: DC | PRN
  Administered 2017-12-08 (×3): 0.5 mg via INTRAVENOUS
  Filled 2017-12-08 (×3): qty 1

## 2017-12-08 MED ORDER — DOCUSATE SODIUM 100 MG PO CAPS
100.0000 mg | ORAL_CAPSULE | Freq: Two times a day (BID) | ORAL | Status: DC
  Administered 2017-12-08 – 2017-12-10 (×4): 100 mg via ORAL
  Filled 2017-12-08 (×4): qty 1

## 2017-12-08 MED ORDER — MIDAZOLAM HCL 2 MG/2ML IJ SOLN
INTRAMUSCULAR | Status: DC | PRN
  Administered 2017-12-08: 2 mg via INTRAVENOUS

## 2017-12-08 MED ORDER — IPRATROPIUM-ALBUTEROL 0.5-2.5 (3) MG/3ML IN SOLN
3.0000 mL | RESPIRATORY_TRACT | Status: DC | PRN
  Filled 2017-12-08: qty 3

## 2017-12-08 MED ORDER — ACETAMINOPHEN 325 MG PO TABS: 325.0000 mg | ORAL_TABLET | Freq: Four times a day (QID) | ORAL | Status: DC | PRN

## 2017-12-08 MED ORDER — FLUTICASONE FUROATE-VILANTEROL 200-25 MCG/INH IN AEPB
1.0000 | INHALATION_SPRAY | Freq: Every day | RESPIRATORY_TRACT | Status: DC
  Administered 2017-12-08 – 2017-12-10 (×3): 1 via RESPIRATORY_TRACT
  Filled 2017-12-08: qty 28

## 2017-12-08 MED ORDER — ENOXAPARIN SODIUM 40 MG/0.4ML ~~LOC~~ SOLN
40.0000 mg | SUBCUTANEOUS | Status: DC
  Administered 2017-12-09 – 2017-12-10 (×2): 40 mg via SUBCUTANEOUS
  Filled 2017-12-08 (×2): qty 0.4

## 2017-12-08 MED ORDER — BUPIVACAINE HCL (PF) 0.5 % IJ SOLN
INTRAMUSCULAR | Status: DC | PRN
  Administered 2017-12-08: 11 mg

## 2017-12-08 MED ORDER — DILTIAZEM HCL ER COATED BEADS 120 MG PO CP24
120.0000 mg | ORAL_CAPSULE | Freq: Every day | ORAL | Status: DC
  Administered 2017-12-08 – 2017-12-10 (×3): 120 mg via ORAL
  Filled 2017-12-08 (×5): qty 1

## 2017-12-08 MED ORDER — FENTANYL CITRATE (PF) 100 MCG/2ML IJ SOLN
INTRAMUSCULAR | Status: DC | PRN
  Administered 2017-12-08 (×2): 25 ug via INTRAVENOUS
  Administered 2017-12-08: 50 ug via INTRAVENOUS

## 2017-12-08 MED ORDER — VANCOMYCIN HCL 1000 MG IV SOLR
INTRAVENOUS | Status: DC | PRN
  Administered 2017-12-08: 1000 mg via INTRAVENOUS

## 2017-12-08 MED ORDER — DOCUSATE SODIUM 100 MG PO CAPS: 100.0000 mg | ORAL_CAPSULE | Freq: Two times a day (BID) | ORAL | Status: DC

## 2017-12-08 MED ORDER — HYDROCODONE-ACETAMINOPHEN 5-325 MG PO TABS
1.0000 | ORAL_TABLET | ORAL | Status: DC | PRN
  Administered 2017-12-08 – 2017-12-09 (×3): 2 via ORAL
  Administered 2017-12-09: 1 via ORAL
  Administered 2017-12-09 – 2017-12-10 (×4): 2 via ORAL
  Filled 2017-12-08 (×8): qty 2

## 2017-12-08 MED ORDER — HYDROMORPHONE HCL 1 MG/ML IJ SOLN: 0.2500 mg | INTRAMUSCULAR | Status: DC | PRN

## 2017-12-08 MED ORDER — POTASSIUM CHLORIDE IN NACL 20-0.9 MEQ/L-% IV SOLN
INTRAVENOUS | Status: DC
  Administered 2017-12-08 – 2017-12-09 (×2): via INTRAVENOUS
  Administered 2017-12-09: 75 mL/h via INTRAVENOUS
  Filled 2017-12-08 (×7): qty 1000

## 2017-12-08 MED ORDER — MAGNESIUM CITRATE PO SOLN
1.0000 | Freq: Once | ORAL | Status: DC | PRN
  Filled 2017-12-08 (×2): qty 296

## 2017-12-08 MED ORDER — MUPIROCIN 2 % EX OINT
1.0000 "application " | TOPICAL_OINTMENT | Freq: Two times a day (BID) | CUTANEOUS | Status: DC
  Administered 2017-12-08 – 2017-12-10 (×5): 1 via NASAL
  Filled 2017-12-08: qty 22

## 2017-12-08 MED ORDER — MIDAZOLAM HCL 2 MG/2ML IJ SOLN
INTRAMUSCULAR | Status: AC
  Filled 2017-12-08: qty 2

## 2017-12-08 MED ORDER — BISACODYL 5 MG PO TBEC: 5.0000 mg | DELAYED_RELEASE_TABLET | Freq: Every day | ORAL | Status: DC | PRN

## 2017-12-08 MED ORDER — BISACODYL 10 MG RE SUPP
10.0000 mg | Freq: Every day | RECTAL | Status: DC | PRN
  Administered 2017-12-10: 10 mg via RECTAL
  Filled 2017-12-08: qty 1

## 2017-12-08 MED ORDER — FLUTICASONE PROPIONATE 50 MCG/ACT NA SUSP
1.0000 | Freq: Every day | NASAL | Status: DC | PRN
  Filled 2017-12-08: qty 16

## 2017-12-08 MED ORDER — VANCOMYCIN HCL IN DEXTROSE 1-5 GM/200ML-% IV SOLN
1000.0000 mg | Freq: Two times a day (BID) | INTRAVENOUS | Status: AC
  Administered 2017-12-08: 1000 mg via INTRAVENOUS
  Filled 2017-12-08: qty 200

## 2017-12-08 MED ORDER — ACETAMINOPHEN 325 MG PO TABS: 325.0000 mg | ORAL_TABLET | ORAL | Status: DC | PRN

## 2017-12-08 MED ORDER — METHOCARBAMOL 1000 MG/10ML IJ SOLN
500.0000 mg | Freq: Four times a day (QID) | INTRAVENOUS | Status: DC | PRN
  Filled 2017-12-08: qty 5

## 2017-12-08 MED ORDER — ALUM & MAG HYDROXIDE-SIMETH 200-200-20 MG/5ML PO SUSP: 30.0000 mL | ORAL | Status: DC | PRN

## 2017-12-08 MED ORDER — VANCOMYCIN HCL IN DEXTROSE 1-5 GM/200ML-% IV SOLN
INTRAVENOUS | Status: AC
  Filled 2017-12-08: qty 200

## 2017-12-08 MED ORDER — HEPARIN SODIUM (PORCINE) 5000 UNIT/ML IJ SOLN: 5000.0000 [IU] | Freq: Three times a day (TID) | INTRAMUSCULAR | Status: DC

## 2017-12-08 MED ORDER — HYDROCODONE-ACETAMINOPHEN 7.5-325 MG PO TABS: 1.0000 | ORAL_TABLET | Freq: Once | ORAL | Status: DC | PRN

## 2017-12-08 MED ORDER — ACETAMINOPHEN 325 MG PO TABS: 650.0000 mg | ORAL_TABLET | Freq: Four times a day (QID) | ORAL | Status: DC | PRN

## 2017-12-08 MED ORDER — HYDROCODONE-ACETAMINOPHEN 5-325 MG PO TABS
1.0000 | ORAL_TABLET | ORAL | Status: DC | PRN
  Administered 2017-12-08 (×2): 2 via ORAL
  Filled 2017-12-08 (×2): qty 2

## 2017-12-08 MED ORDER — STERILE WATER FOR INJECTION IJ SOLN
INTRAMUSCULAR | Status: AC
  Filled 2017-12-08: qty 10

## 2017-12-08 MED ORDER — HYDROCODONE-ACETAMINOPHEN 7.5-325 MG PO TABS
1.0000 | ORAL_TABLET | ORAL | Status: DC | PRN
  Administered 2017-12-09: 1 via ORAL
  Administered 2017-12-10 (×2): 2 via ORAL
  Filled 2017-12-08 (×2): qty 2
  Filled 2017-12-08: qty 1

## 2017-12-08 MED ORDER — MEPERIDINE HCL 50 MG/ML IJ SOLN: 6.2500 mg | INTRAMUSCULAR | Status: DC | PRN

## 2017-12-08 MED ORDER — METHOCARBAMOL 500 MG PO TABS
500.0000 mg | ORAL_TABLET | Freq: Four times a day (QID) | ORAL | Status: DC | PRN
  Administered 2017-12-09 – 2017-12-10 (×2): 500 mg via ORAL
  Filled 2017-12-08 (×2): qty 1

## 2017-12-08 MED ORDER — LACTATED RINGERS IV SOLN
INTRAVENOUS | Status: DC | PRN
  Administered 2017-12-08: 15:00:00 via INTRAVENOUS

## 2017-12-08 MED ORDER — TRAZODONE HCL 50 MG PO TABS
25.0000 mg | ORAL_TABLET | Freq: Every evening | ORAL | Status: DC | PRN
  Administered 2017-12-09: 25 mg via ORAL
  Filled 2017-12-08: qty 1

## 2017-12-08 MED ORDER — PANTOPRAZOLE SODIUM 40 MG PO TBEC
40.0000 mg | DELAYED_RELEASE_TABLET | Freq: Every day | ORAL | Status: DC
  Administered 2017-12-09 – 2017-12-10 (×2): 40 mg via ORAL
  Filled 2017-12-08 (×2): qty 1

## 2017-12-08 MED ORDER — ONDANSETRON HCL 4 MG PO TABS: 4.0000 mg | ORAL_TABLET | Freq: Four times a day (QID) | ORAL | Status: DC | PRN

## 2017-12-08 MED ORDER — SODIUM CHLORIDE 0.9 % IV SOLN
INTRAVENOUS | Status: DC | PRN
  Administered 2017-12-08: 15:00:00 via INTRAVENOUS

## 2017-12-08 MED ORDER — MORPHINE SULFATE (PF) 2 MG/ML IV SOLN
0.5000 mg | INTRAVENOUS | Status: DC | PRN
  Administered 2017-12-08: 1 mg via INTRAVENOUS
  Administered 2017-12-09: 0.5 mg via INTRAVENOUS
  Filled 2017-12-08 (×2): qty 1

## 2017-12-08 MED ORDER — BUPIVACAINE HCL (PF) 0.5 % IJ SOLN: INTRAMUSCULAR | Status: DC | PRN

## 2017-12-08 MED ORDER — LORATADINE 10 MG PO TABS: 10.0000 mg | ORAL_TABLET | Freq: Every day | ORAL | Status: DC | PRN

## 2017-12-08 MED ORDER — RISPERIDONE 1 MG PO TABS
1.0000 mg | ORAL_TABLET | Freq: Every evening | ORAL | Status: DC | PRN
  Filled 2017-12-08: qty 1

## 2017-12-08 MED ORDER — ALBUTEROL SULFATE (2.5 MG/3ML) 0.083% IN NEBU: 2.5000 mg | INHALATION_SOLUTION | RESPIRATORY_TRACT | Status: DC | PRN

## 2017-12-08 MED ORDER — ACETAMINOPHEN 160 MG/5ML PO SOLN
325.0000 mg | ORAL | Status: DC | PRN
  Filled 2017-12-08: qty 20.3

## 2017-12-08 MED ORDER — FENTANYL CITRATE (PF) 100 MCG/2ML IJ SOLN
INTRAMUSCULAR | Status: AC
  Filled 2017-12-08: qty 2

## 2017-12-08 MED ORDER — SODIUM CHLORIDE 0.9 % IV SOLN
INTRAVENOUS | Status: DC
  Administered 2017-12-08: 02:00:00 via INTRAVENOUS

## 2017-12-08 MED ORDER — ACETAMINOPHEN 500 MG PO TABS
500.0000 mg | ORAL_TABLET | Freq: Four times a day (QID) | ORAL | Status: AC
  Administered 2017-12-08 – 2017-12-09 (×2): 500 mg via ORAL
  Filled 2017-12-08 (×2): qty 1

## 2017-12-08 MED ORDER — MIDAZOLAM HCL 2 MG/2ML IJ SOLN
INTRAMUSCULAR | Status: AC
Start: 2017-12-08 — End: ?
  Filled 2017-12-08: qty 2

## 2017-12-08 MED ORDER — POLYETHYLENE GLYCOL 3350 17 G PO PACK
17.0000 g | PACK | Freq: Every day | ORAL | Status: DC | PRN
  Administered 2017-12-09 – 2017-12-10 (×2): 17 g via ORAL
  Filled 2017-12-08 (×2): qty 1

## 2017-12-08 MED ORDER — NEOMYCIN-POLYMYXIN B GU 40-200000 IR SOLN
Status: AC
  Filled 2017-12-08: qty 4

## 2017-12-08 MED ORDER — ACETAMINOPHEN 650 MG RE SUPP: 650.0000 mg | Freq: Four times a day (QID) | RECTAL | Status: DC | PRN

## 2017-12-08 MED ORDER — PROPOFOL 10 MG/ML IV BOLUS
INTRAVENOUS | Status: AC
  Filled 2017-12-08: qty 20

## 2017-12-08 MED ORDER — PROPOFOL 500 MG/50ML IV EMUL
INTRAVENOUS | Status: DC | PRN
  Administered 2017-12-08: 50 ug/kg/min via INTRAVENOUS

## 2017-12-08 MED ORDER — DIVALPROEX SODIUM 500 MG PO DR TAB
1000.0000 mg | DELAYED_RELEASE_TABLET | Freq: Two times a day (BID) | ORAL | Status: DC
  Administered 2017-12-08 – 2017-12-10 (×5): 1000 mg via ORAL
  Filled 2017-12-08 (×9): qty 2

## 2017-12-08 MED ORDER — SUCRALFATE 1 G PO TABS
1.0000 g | ORAL_TABLET | Freq: Four times a day (QID) | ORAL | Status: DC
  Administered 2017-12-08 – 2017-12-10 (×7): 1 g via ORAL
  Filled 2017-12-08 (×8): qty 1

## 2017-12-08 MED ORDER — ADULT MULTIVITAMIN W/MINERALS CH
1.0000 | ORAL_TABLET | Freq: Every day | ORAL | Status: DC
  Administered 2017-12-09 – 2017-12-10 (×2): 1 via ORAL
  Filled 2017-12-08 (×2): qty 1

## 2017-12-08 MED ORDER — TIOTROPIUM BROMIDE MONOHYDRATE 18 MCG IN CAPS
18.0000 ug | ORAL_CAPSULE | Freq: Every day | RESPIRATORY_TRACT | Status: DC
  Administered 2017-12-08 – 2017-12-10 (×3): 18 ug via RESPIRATORY_TRACT
  Filled 2017-12-08: qty 5

## 2017-12-08 MED ORDER — CLINDAMYCIN PHOSPHATE 600 MG/50ML IV SOLN
600.0000 mg | Freq: Once | INTRAVENOUS | Status: DC
  Filled 2017-12-08: qty 50

## 2017-12-08 SURGICAL SUPPLY — 33 items
BLADE SURG SZ10 CARB STEEL (BLADE) ×2 IMPLANT
BNDG COHESIVE 4X5 TAN STRL (GAUZE/BANDAGES/DRESSINGS) ×4 IMPLANT
BNDG COHESIVE 6X5 TAN STRL LF (GAUZE/BANDAGES/DRESSINGS) ×2 IMPLANT
CANISTER SUCT 1200ML W/VALVE (MISCELLANEOUS) ×2 IMPLANT
DRAPE SURG 17X11 SM STRL (DRAPES) ×4 IMPLANT
DRAPE U-SHAPE 47X51 STRL (DRAPES) ×2 IMPLANT
DRSG OPSITE POSTOP 3X4 (GAUZE/BANDAGES/DRESSINGS) IMPLANT
DRSG OPSITE POSTOP 4X6 (GAUZE/BANDAGES/DRESSINGS) IMPLANT
DURAPREP 26ML APPLICATOR (WOUND CARE) ×4 IMPLANT
ELECT REM PT RETURN 9FT ADLT (ELECTROSURGICAL) ×2
ELECTRODE REM PT RTRN 9FT ADLT (ELECTROSURGICAL) ×1 IMPLANT
GAUZE SPONGE 4X4 12PLY STRL (GAUZE/BANDAGES/DRESSINGS) ×2 IMPLANT
GLOVE BIOGEL PI IND STRL 9 (GLOVE) ×1 IMPLANT
GLOVE BIOGEL PI INDICATOR 9 (GLOVE) ×1
GLOVE SURG 9.0 ORTHO LTXF (GLOVE) ×4 IMPLANT
GOWN STRL REUS TWL 2XL XL LVL4 (GOWN DISPOSABLE) ×2 IMPLANT
GOWN STRL REUS W/ TWL LRG LVL3 (GOWN DISPOSABLE) ×2 IMPLANT
GOWN STRL REUS W/TWL LRG LVL3 (GOWN DISPOSABLE) ×2
GUIDEWIRE THREADED 2.8 (WIRE) ×4 IMPLANT
GUIDEWIRE THREADED 2.8MM (WIRE) ×2 IMPLANT
HOLDER FOLEY CATH W/STRAP (MISCELLANEOUS) IMPLANT
MAT BLUE FLOOR 46X72 FLO (MISCELLANEOUS) ×2 IMPLANT
NS IRRIG 1000ML POUR BTL (IV SOLUTION) ×2 IMPLANT
PACK HIP COMPR (MISCELLANEOUS) ×2 IMPLANT
SCREW CANN 32 THRD/100 7.3 (Screw) ×2 IMPLANT
SCREW CANN 32 THRD/90 7.3 (Screw) ×4 IMPLANT
SCREW CANN 32 THRD/95 7.3 (Screw) ×2 IMPLANT
STAPLER SKIN PROX 35W (STAPLE) ×2 IMPLANT
STRAP SAFETY 5IN WIDE (MISCELLANEOUS) ×2 IMPLANT
SUT VIC AB 0 CT1 36 (SUTURE) ×2 IMPLANT
SUT VIC AB 2-0 CT2 27 (SUTURE) ×2 IMPLANT
SUT VICRYL 0 AB UR-6 (SUTURE) ×4 IMPLANT
TRAY FOLEY MTR SLVR 16FR STAT (SET/KITS/TRAYS/PACK) IMPLANT

## 2017-12-08 NOTE — Anesthesia Post-op Follow-up Note (Signed)
Anesthesia QCDR form completed.        

## 2017-12-08 NOTE — NC FL2 (Signed)
[place] LEVEL OF CARE SCREENING TOOL     IDENTIFICATION  Patient Name: Charlene [name] Birthdate: Mar 20, 1961 Sex: female Admission Date (Current Location): 12/07/2017  [facility] and [place] Number:  [name] ([id number] [phone]) Facility and Address:  [facility], [address] [place], [place] [address]      Provider Number: [id number]  Attending Physician Name and Address:  Bettey Costa, MD  Relative Name and Phone Number:       Current Level of Care: Hospital Recommended Level of Care: [place] Prior Approval Number:    Date Approved/Denied:   PASRR Number:    Discharge Plan: SNF    Current Diagnoses: Patient Active Problem List   Diagnosis Date Noted  . Hip fx ([place]) 12/07/2017  . Abdominal distension 08/28/2017  . Respiratory failure ([place])   . Lethargy   . COPD exacerbation ([place]) 11/16/2016  . HCAP (healthcare-associated pneumonia) 12/31/2015  . Acute encephalopathy 12/31/2015  . Herpes zoster 12/31/2015  . Anemia 12/31/2015  . Acute psychosis associated with endocrine, metabolic, or cerebrovascular disorder 12/21/2015  . Cocaine use disorder, moderate, dependence ([place]) 12/21/2015  . Bipolar I disorder, most recent episode manic, severe with psychotic features ([place]) 12/21/2015  . OCD (obsessive compulsive disorder) 12/21/2015  . PTSD (post-traumatic stress disorder) 12/21/2015  . Abnormal cells of cervix 07/30/2015  . Encounter for general adult medical examination without abnormal findings 07/28/2015  . Primary cancer of right lower lobe of lung ([place]) 07/12/2015  . At risk for noncompliance 06/29/2015  . Spasm 06/26/2015  . Essential (primary) hypertension 06/21/2015  . Lung mass 06/21/2015  . Candida infection of mouth 06/21/2015  . Episodic mood disorder ([place]) 10/14/2014  . Chronic obstructive pulmonary disease ([place]) 10/06/2014  . Pulmonary emphysema ([place]) 10/06/2014  . LBP (low back pain) 09/27/2014  .  Tobacco use disorder 09/27/2014    Orientation RESPIRATION BLADDER Height & Weight     Self, Time, Situation, Place  O2(2 Liters Oxygen. ) Continent Weight: 170 lb (77.1 kg) Height:  5\' 8"  (172.7 cm)  BEHAVIORAL SYMPTOMS/MOOD NEUROLOGICAL BOWEL NUTRITION STATUS      Continent Diet(Diet: Regular. )  AMBULATORY STATUS COMMUNICATION OF NEEDS Skin   Extensive Assist Verbally Surgical wounds(Incision: Right Hip. )                       Personal Care Assistance Level of Assistance  Bathing, Feeding, Dressing Bathing Assistance: Limited assistance Feeding assistance: Independent Dressing Assistance: Limited assistance     Functional Limitations Info  Sight, Hearing, Speech Sight Info: Adequate Hearing Info: Adequate Speech Info: Adequate    SPECIAL CARE FACTORS FREQUENCY  PT (By licensed PT), OT (By licensed OT)     PT Frequency: (5) OT Frequency: (5)            Contractures      Additional Factors Info  Code Status, Allergies, Isolation Precautions Code Status Info: (Full Code. ) Allergies Info: (Amoxicillin-pot Clavulanate, Septra Sulfamethoxazole- trimethoprim)     Isolation Precautions Info: (MRSA Nasal Swab. )     Current Medications (12/08/2017):  This is the current hospital active medication list Current Facility-Administered Medications  Medication Dose Route Frequency Provider Last Rate Last Dose  . 0.9 % NaCl with KCl 20 mEq/ L  infusion   Intravenous Continuous Thornton Park, MD      . Derrill Memo ON 12/09/2017] acetaminophen (TYLENOL) tablet 325-650 mg  325-650 mg Oral Q6H PRN Thornton Park, MD      .  acetaminophen (TYLENOL) tablet 500 mg  500 mg Oral Q6H Thornton Park, MD      . albuterol (PROVENTIL) (2.5 MG/3ML) 0.083% nebulizer solution 2.5 mg  2.5 mg Inhalation Q4H PRN Thornton Park, MD      . alum & mag hydroxide-simeth (MAALOX/MYLANTA) 200-200-20 MG/5ML suspension 30 mL  30 mL Oral Q4H PRN Thornton Park, MD      . bisacodyl (DULCOLAX)  suppository 10 mg  10 mg Rectal Daily PRN Thornton Park, MD      . diltiazem (CARDIZEM CD) 24 hr capsule 120 mg  120 mg Oral Daily Thornton Park, MD      . divalproex (DEPAKOTE) DR tablet 1,000 mg  1,000 mg Oral Q12H Thornton Park, MD   1,000 mg at 12/08/17 1145  . docusate sodium (COLACE) capsule 100 mg  100 mg Oral BID Thornton Park, MD      . Derrill Memo ON 12/09/2017] enoxaparin (LOVENOX) injection 40 mg  40 mg Subcutaneous Q24H Thornton Park, MD      . fluticasone [facility]) 50 MCG/ACT nasal spray 1 spray  1 spray Each Nare Daily PRN Thornton Park, MD      . fluticasone furoate-vilanterol (BREO ELLIPTA) 200-25 MCG/INH 1 puff  1 puff Inhalation Daily Thornton Park, MD   1 puff at 12/08/17 0816  . HYDROcodone-acetaminophen (NORCO) 7.5-325 MG per tablet 1-2 tablet  1-2 tablet Oral Q4H PRN Thornton Park, MD      . HYDROcodone-acetaminophen (NORCO/VICODIN) 5-325 MG per tablet 1-2 tablet  1-2 tablet Oral Q4H PRN Thornton Park, MD      . ipratropium-albuterol (DUONEB) 0.5-2.5 (3) MG/3ML nebulizer solution 3 mL  3 mL Nebulization Q4H PRN Thornton Park, MD      . loratadine (CLARITIN) tablet 10 mg  10 mg Oral Daily PRN Thornton Park, MD      . magnesium citrate solution 1 Bottle  1 Bottle Oral Once PRN Thornton Park, MD      . methocarbamol (ROBAXIN) tablet 500 mg  500 mg Oral Q6H PRN Thornton Park, MD       Or  . methocarbamol (ROBAXIN) 500 mg in dextrose 5 % 50 mL IVPB  500 mg Intravenous Q6H PRN Thornton Park, MD      . morphine 2 MG/ML injection 0.5-1 mg  0.5-1 mg Intravenous Q2H PRN Thornton Park, MD      . multivitamin with minerals tablet 1 tablet  1 tablet Oral Daily Thornton Park, MD      . mupirocin ointment (BACTROBAN) 2 % 1 application  1 application Nasal BID Thornton Park, MD   1 application at 62/22/97 0818  . ondansetron (ZOFRAN) tablet 4 mg  4 mg Oral Q6H PRN Thornton Park, MD       Or  . ondansetron [facility]) injection 4 mg  4 mg  Intravenous Q6H PRN Thornton Park, MD      . pantoprazole (PROTONIX) EC tablet 40 mg  40 mg Oral Daily Thornton Park, MD      . polyethylene glycol (MIRALAX / GLYCOLAX) packet 17 g  17 g Oral Daily PRN Thornton Park, MD      . risperiDONE (RISPERDAL) tablet 1 mg  1 mg Oral QHS PRN Thornton Park, MD      . sucralfate (CARAFATE) tablet 1 g  1 g Oral QID Thornton Park, MD      . tiotropium [facility]) inhalation capsule 18 mcg  18 mcg Inhalation Daily Thornton Park, MD   18 mcg at 12/08/17 0813  . traZODone (DESYREL)  tablet 25 mg  25 mg Oral QHS PRN Thornton Park, MD      . vancomycin (VANCOCIN) 1-5 GM/200ML-% IVPB           . vancomycin (VANCOCIN) IVPB 1000 mg/200 mL premix  1,000 mg Intravenous Once Thornton Park, MD      . Derrill Memo ON 12/09/2017] vancomycin (VANCOCIN) IVPB 1000 mg/200 mL premix  1,000 mg Intravenous Q12H Thornton Park, MD         Discharge Medications: Please see discharge summary for a list of discharge medications.  Relevant Imaging Results:  Relevant Lab Results:   Additional Information (SSN: [id number])  Hibba Schram, Veronia Beets, LCSW

## 2017-12-08 NOTE — Transfer of Care (Signed)
Immediate Anesthesia Transfer of Care Note  Patient: Charlene [name]  Procedure(s) Performed: CANNULATED HIP PINNING (Right Hip)  Patient Location: PACU  Anesthesia Type:Spinal  Level of Consciousness: awake and patient cooperative  Airway & Oxygen Therapy: Patient Spontanous Breathing and Patient connected to face mask oxygen  Post-op Assessment: Report given to RN and Post -op Vital signs reviewed and stable  Post vital signs: Reviewed and stable  Last Vitals:  Vitals Value Taken Time  BP 112/79 12/08/2017  3:43 PM  Temp    Pulse 85 12/08/2017  3:52 PM  Resp 15 12/08/2017  3:52 PM  SpO2 100 % 12/08/2017  3:52 PM  Vitals shown include unvalidated device data.  Last Pain:  Vitals:   12/08/17 1326  TempSrc: Tympanic  PainSc: 8       Patients Stated Pain Goal: 0 (06/17/70 5366)  Complications: No apparent anesthesia complications

## 2017-12-08 NOTE — Progress Notes (Signed)
[facility] [place] NAME: Charlene [name]    MR#:  [id number]  DATE OF BIRTH:  09-Jun-1961  SUBJECTIVE:   Patient had a mechanical fall and now has hip fracture Going to the OR today  REVIEW OF SYSTEMS:    Review of Systems  Constitutional: Negative for fever, chills weight loss HENT: Negative for ear pain, nosebleeds, congestion, facial swelling, rhinorrhea, neck pain, neck stiffness and ear discharge.   Respiratory: Negative for cough, shortness of breath, wheezing  Cardiovascular: Negative for chest pain, palpitations and leg swelling.  Gastrointestinal: Negative for heartburn, abdominal pain, vomiting, diarrhea or consitpation Genitourinary: Negative for dysuria, urgency, frequency, hematuria Musculoskeletal: Negative for back pain or+= joint pain Neurological: Negative for dizziness, seizures, syncope, focal weakness,  numbness and headaches.  Hematological: Does not bruise/bleed easily.  Psychiatric/Behavioral: Negative for hallucinations, confusion, dysphoric mood    Tolerating Diet: npo      DRUG ALLERGIES:   Allergies  Allergen Reactions  . Amoxicillin-Pot Clavulanate Hives    Also vomiting  . Septra [Sulfamethoxazole-Trimethoprim] Hives and Nausea And Vomiting    VITALS:  Blood pressure 131/90, pulse 96, temperature 98.3 F (36.8 C), temperature source Oral, resp. rate 18, height 5\' 8"  (1.727 m), weight 77.1 kg (170 lb), SpO2 97 %.  PHYSICAL EXAMINATION:  Constitutional: Appears well-developed and well-nourished. No distress. HENT: Normocephalic. Marland Kitchen Oropharynx is clear and moist.  Eyes: Conjunctivae and EOM are normal. PERRLA, no scleral icterus.  Neck: Normal ROM. Neck supple. No JVD. No tracheal deviation. CVS: RRR, S1/S2 +, no murmurs, no gallops, no carotid bruit.  Pulmonary: Effort and breath sounds normal, no stridor, rhonchi, wheezes, rales.  Abdominal: Soft. BS +,  no distension, tenderness, rebound or  guarding.  Musculoskeletal: shorter right leg. No edema and no tenderness.  Neuro: Alert. CN 2-12 grossly intact. No focal deficits. Skin: Right facial hematoma  psychiatric: Normal mood and affect.      LABORATORY PANEL:   CBC Recent Labs  Lab 12/08/17 0310  WBC 11.0  HGB 12.1  HCT 35.7  PLT 202   ------------------------------------------------------------------------------------------------------------------  Chemistries  Recent Labs  Lab 12/08/17 0310  NA 134*  K 4.1  CL 99  CO2 27  GLUCOSE 110*  BUN 12  CREATININE 0.66  CALCIUM 8.7*   ------------------------------------------------------------------------------------------------------------------  Cardiac Enzymes No results for input(s): TROPONINI in the last 168 hours. ------------------------------------------------------------------------------------------------------------------  RADIOLOGY:  Ct Head Wo Contrast  Result Date: 12/07/2017 CLINICAL DATA:  Fall down stairs.  Right facial pain. EXAM: CT HEAD WITHOUT CONTRAST CT MAXILLOFACIAL WITHOUT CONTRAST CT CERVICAL SPINE WITHOUT CONTRAST TECHNIQUE: Multidetector CT imaging of the head, cervical spine, and maxillofacial structures were performed using the standard protocol without intravenous contrast. Multiplanar CT image reconstructions of the cervical spine and maxillofacial structures were also generated. COMPARISON:  Head CT 12/31/2015 FINDINGS: CT HEAD FINDINGS Brain: There is no mass, hemorrhage or extra-axial collection. The size and configuration of the ventricles and extra-axial CSF spaces are normal. There is no acute or chronic infarction. There is hypoattenuation of the periventricular white matter, most commonly indicating chronic ischemic microangiopathy. Vascular: No abnormal hyperdensity of the major intracranial arteries or dural venous sinuses. No intracranial atherosclerosis. Skull: The visualized skull base, calvarium and extracranial soft  tissues are normal. CT MAXILLOFACIAL FINDINGS Osseous: --Complex facial fracture types: No LeFort, zygomaticomaxillary complex or nasoorbitoethmoidal fracture. --Simple fracture types: None. --Mandible: No fracture or dislocation. Orbits: The globes are intact. Normal appearance of the intra- and extraconal fat.  Symmetric extraocular muscles and optic nerves. Sinuses: Chronic right maxillary sinusitis. Soft tissues: Right facial hematoma and subcutaneous edema overlying the zygomatic arch. CT CERVICAL SPINE FINDINGS Alignment: No static subluxation. Facets are aligned. Occipital condyles and the lateral masses of C1-C2 are aligned. Skull base and vertebrae: No acute fracture. Soft tissues and spinal canal: No prevertebral fluid or swelling. No visible canal hematoma. Disc levels: Disc space narrowing is greatest at C5-C6. Upper chest: No pneumothorax, pulmonary nodule or pleural effusion. Other: Normal visualized paraspinal cervical soft tissues. IMPRESSION: 1. Advanced sequelae of chronic ischemic microangiopathy without acute intracranial abnormality. Chronic ischemic white matter changes and markedly progressed compared to 12/31/2015. 2. No calvarial or facial fracture. 3. Large right facial hematoma. 4. No acute fracture or static subluxation of the cervical spine. Electronically Signed   By: Ulyses Jarred M.D.   On: 12/07/2017 21:21   Ct Cervical Spine Wo Contrast  Result Date: 12/07/2017 CLINICAL DATA:  Fall down stairs.  Right facial pain. EXAM: CT HEAD WITHOUT CONTRAST CT MAXILLOFACIAL WITHOUT CONTRAST CT CERVICAL SPINE WITHOUT CONTRAST TECHNIQUE: Multidetector CT imaging of the head, cervical spine, and maxillofacial structures were performed using the standard protocol without intravenous contrast. Multiplanar CT image reconstructions of the cervical spine and maxillofacial structures were also generated. COMPARISON:  Head CT 12/31/2015 FINDINGS: CT HEAD FINDINGS Brain: There is no mass, hemorrhage or  extra-axial collection. The size and configuration of the ventricles and extra-axial CSF spaces are normal. There is no acute or chronic infarction. There is hypoattenuation of the periventricular white matter, most commonly indicating chronic ischemic microangiopathy. Vascular: No abnormal hyperdensity of the major intracranial arteries or dural venous sinuses. No intracranial atherosclerosis. Skull: The visualized skull base, calvarium and extracranial soft tissues are normal. CT MAXILLOFACIAL FINDINGS Osseous: --Complex facial fracture types: No LeFort, zygomaticomaxillary complex or nasoorbitoethmoidal fracture. --Simple fracture types: None. --Mandible: No fracture or dislocation. Orbits: The globes are intact. Normal appearance of the intra- and extraconal fat. Symmetric extraocular muscles and optic nerves. Sinuses: Chronic right maxillary sinusitis. Soft tissues: Right facial hematoma and subcutaneous edema overlying the zygomatic arch. CT CERVICAL SPINE FINDINGS Alignment: No static subluxation. Facets are aligned. Occipital condyles and the lateral masses of C1-C2 are aligned. Skull base and vertebrae: No acute fracture. Soft tissues and spinal canal: No prevertebral fluid or swelling. No visible canal hematoma. Disc levels: Disc space narrowing is greatest at C5-C6. Upper chest: No pneumothorax, pulmonary nodule or pleural effusion. Other: Normal visualized paraspinal cervical soft tissues. IMPRESSION: 1. Advanced sequelae of chronic ischemic microangiopathy without acute intracranial abnormality. Chronic ischemic white matter changes and markedly progressed compared to 12/31/2015. 2. No calvarial or facial fracture. 3. Large right facial hematoma. 4. No acute fracture or static subluxation of the cervical spine. Electronically Signed   By: Ulyses Jarred M.D.   On: 12/07/2017 21:21   Ct Hip Right Wo Contrast  Result Date: 12/07/2017 CLINICAL DATA:  Right hip pain after mechanical fall. EXAM: CT OF THE  RIGHT HIP WITHOUT CONTRAST TECHNIQUE: Multidetector CT imaging of the right hip was performed according to the standard protocol. Multiplanar CT image reconstructions were also generated. 3D reconstructions were also provided of the right hip. COMPARISON:  Same day radiographs FINDINGS: Bones/Joint/Cartilage There is an acute, closed, subcapital right femoral neck fracture with impaction of the femoral neck on femoral head along its superolateral aspect. Slight caudal displacement of the femoral neck relative to the femoral head. No joint dislocation. Pubic rami appear intact. Ligaments Suboptimally assessed by CT.  Muscles and Tendons No intramuscular hemorrhage.  No atrophy. Soft tissues No subcutaneous soft tissue fluid collection or hematoma. Mild soft tissue contusion along the lateral aspect of the right hip. IMPRESSION: Acute, closed, slightly impacted subcapital right femoral neck fracture with minimal caudal displacement of the femoral neck relative to the head. Electronically Signed   By: Ashley Royalty M.D.   On: 12/07/2017 23:10   Ct 3d Recon At Scanner  Result Date: 12/07/2017 CLINICAL DATA:  Right hip pain after mechanical fall. EXAM: CT OF THE RIGHT HIP WITHOUT CONTRAST TECHNIQUE: Multidetector CT imaging of the right hip was performed according to the standard protocol. Multiplanar CT image reconstructions were also generated. 3D reconstructions were also provided of the right hip. COMPARISON:  Same day radiographs FINDINGS: Bones/Joint/Cartilage There is an acute, closed, subcapital right femoral neck fracture with impaction of the femoral neck on femoral head along its superolateral aspect. Slight caudal displacement of the femoral neck relative to the femoral head. No joint dislocation. Pubic rami appear intact. Ligaments Suboptimally assessed by CT. Muscles and Tendons No intramuscular hemorrhage.  No atrophy. Soft tissues No subcutaneous soft tissue fluid collection or hematoma. Mild soft tissue  contusion along the lateral aspect of the right hip. IMPRESSION: Acute, closed, slightly impacted subcapital right femoral neck fracture with minimal caudal displacement of the femoral neck relative to the head. Electronically Signed   By: Ashley Royalty M.D.   On: 12/07/2017 23:10   Dg Chest Portable 1 View  Result Date: 12/07/2017 CLINICAL DATA:  57 year old female with fall and right hip pain. History of lung cancer. EXAM: PORTABLE CHEST 1 VIEW COMPARISON:  Chest CT dated 11/19/2017 FINDINGS: Right pectoral Port-A-Cath with tip in the region of the cavoatrial junction. Patchy areas of density in the right hilar region similar to prior CT most consistent with post radiation changes. No new consolidative changes. There is no pleural effusion or pneumothorax. The cardiac silhouette is within normal limits. No acute osseous pathology. IMPRESSION: No acute cardiopulmonary process. Electronically Signed   By: Anner Crete M.D.   On: 12/07/2017 21:27   Dg Hip Unilat W Or Wo Pelvis 2-3 Views Right  Result Date: 12/07/2017 CLINICAL DATA:  57 year old female with fall and right hip pain. EXAM: DG HIP (WITH OR WITHOUT PELVIS) 2-3V RIGHT COMPARISON:  None. FINDINGS: There is a minimally displaced fracture of the right femoral neck. There is no dislocation. The bones are mildly osteopenic. Mild arthritic changes of the hips. There is degenerative changes of the lower lumbar spine. The soft tissues appear unremarkable. IMPRESSION: Minimally displaced fracture of the right femoral neck. Electronically Signed   By: Anner Crete M.D.   On: 12/07/2017 21:25   Ct Maxillofacial Wo Contrast  Result Date: 12/07/2017 CLINICAL DATA:  Fall down stairs.  Right facial pain. EXAM: CT HEAD WITHOUT CONTRAST CT MAXILLOFACIAL WITHOUT CONTRAST CT CERVICAL SPINE WITHOUT CONTRAST TECHNIQUE: Multidetector CT imaging of the head, cervical spine, and maxillofacial structures were performed using the standard protocol without  intravenous contrast. Multiplanar CT image reconstructions of the cervical spine and maxillofacial structures were also generated. COMPARISON:  Head CT 12/31/2015 FINDINGS: CT HEAD FINDINGS Brain: There is no mass, hemorrhage or extra-axial collection. The size and configuration of the ventricles and extra-axial CSF spaces are normal. There is no acute or chronic infarction. There is hypoattenuation of the periventricular white matter, most commonly indicating chronic ischemic microangiopathy. Vascular: No abnormal hyperdensity of the major intracranial arteries or dural venous sinuses. No intracranial atherosclerosis. Skull:  The visualized skull base, calvarium and extracranial soft tissues are normal. CT MAXILLOFACIAL FINDINGS Osseous: --Complex facial fracture types: No LeFort, zygomaticomaxillary complex or nasoorbitoethmoidal fracture. --Simple fracture types: None. --Mandible: No fracture or dislocation. Orbits: The globes are intact. Normal appearance of the intra- and extraconal fat. Symmetric extraocular muscles and optic nerves. Sinuses: Chronic right maxillary sinusitis. Soft tissues: Right facial hematoma and subcutaneous edema overlying the zygomatic arch. CT CERVICAL SPINE FINDINGS Alignment: No static subluxation. Facets are aligned. Occipital condyles and the lateral masses of C1-C2 are aligned. Skull base and vertebrae: No acute fracture. Soft tissues and spinal canal: No prevertebral fluid or swelling. No visible canal hematoma. Disc levels: Disc space narrowing is greatest at C5-C6. Upper chest: No pneumothorax, pulmonary nodule or pleural effusion. Other: Normal visualized paraspinal cervical soft tissues. IMPRESSION: 1. Advanced sequelae of chronic ischemic microangiopathy without acute intracranial abnormality. Chronic ischemic white matter changes and markedly progressed compared to 12/31/2015. 2. No calvarial or facial fracture. 3. Large right facial hematoma. 4. No acute fracture or static  subluxation of the cervical spine. Electronically Signed   By: Ulyses Jarred M.D.   On: 12/07/2017 21:21     ASSESSMENT AND PLAN:   57 year old female with a history of COPD who presents after mechanical fall.   1. Acute, closed, slightly impacted subcapital right femoral neck fracture with minimal caudal displacement of the femoral neck relative to the head: Plan to go to the OR today.  Patient at moderate risk for moderate risk procedure.  May proceed without further cardiac evaluation.  DVT prophylaxis and pain control as per orthopedic surgery.  2.  COPD without signs exacerbation  3.  Facial hematoma from mechanical fall: She would benefit from ice   4.  Essential hypertension: Continue diltiazem  Management plans discussed with the patient and She is in agreement.  CODE STATUS: Full  TOTAL TIME TAKING CARE OF THIS PATIENT: 28 minutes.     POSSIBLE D/C 1 to 2 days, DEPENDING ON CLINICAL CONDITION.   Josemaria Brining M.D on 12/08/2017 at 11:52 AM  Between 7am to 6pm - Pager - [phone] After 6pm go to [email or web address] - password [email or web address] [facility]  Office  [phone]  CC: Primary care physician; Sison, Adele Dan, MD  Note: This dictation was prepared with Dragon dictation along with smaller phrase technology. Any transcriptional errors that result from this process are unintentional.

## 2017-12-08 NOTE — Anesthesia Procedure Notes (Signed)
Spinal  Patient location during procedure: OR Start time: 12/08/2017 2:08 PM End time: 12/08/2017 2:12 PM Staffing Anesthesiologist: Alphonsus Sias, MD Performed: anesthesiologist  Preanesthetic Checklist Completed: patient identified, site marked, surgical consent, pre-op evaluation, timeout performed, IV checked, risks and benefits discussed and monitors and equipment checked Spinal Block Patient position: left lateral decubitus Prep: ChloraPrep Patient monitoring: heart rate, cardiac monitor, continuous pulse ox and blood pressure Approach: left paramedian Location: L2-3 Injection technique: single-shot Needle Needle type: Pencan  Needle gauge: 24 G Needle length: 10 cm Needle insertion depth: 5 cm Assessment Sensory level: T10 Additional Notes Hypobaric bupiv 0.4%, inj easily, asp x 3

## 2017-12-08 NOTE — Op Note (Signed)
12/08/2017  6:09 PM  PATIENT:  Charlene [name]    PRE-OPERATIVE DIAGNOSIS:  right femoral neck fracture  POST-OPERATIVE DIAGNOSIS:  Same  PROCEDURE:  CANNULATED HIP PINNING, RIGHT HIP  SURGEON:  Thornton Park, MD  ANESTHESIA:   Spinal  PREOPERATIVE INDICATIONS:  BRITTNAE ASCHENBRENNER is a  57 y.o. female who fell and was found to have a diagnosis of right femerol neck fracture .  Percutaneous fixation has been recommended for fixation of this non-displaced fracture.      The risks benefits and alternatives were discussed with the patient preoperatively including but not limited to infection, bleeding, nerve or blood vessel injury, persistent hip pain,  malunion, nonunion, avascular necrosis, change in lower extremity rotation, leg length discrepancy, failure of the hardware and the need for revision surgery, including the potential for conversion to hemi or total hip arthroplasty. Medical risks include but are not limited to: DVT and pulmonary embolism, myocardial infarction, stroke, pneumonia, respiratory failure and death.  The patient understood and agreed with the plan for surgery.  Patient had the right hip marked with the word yes and my initials according the hospitals correct site of surgery protocol.  OPERATIVE IMPLANTS: 7.3 mm cannulated screws x 3  OPERATIVE FINDINGS: Impacted fracture of the right femoral neck with displacement  OPERATIVE PROCEDURE: The patient was brought to the operating room and a renal anesthetic was administered by the anesthesia service.  The patient was then placed supine on the fracture table. IV vancomycin was administered. The right lower extremity was positioned in a leg holder, without any significant reduction maneuver other than mild internal rotation.  The well leg was placed in hemi-lithotomy position. The hip was prepped and draped in usual sterile fashion.  A time out was performed to verify the patient's name, date of birth, medical record number,  correct site of surgery and correct surger to be performed. The  timeout was also used to verify the patient had received antibiotics that all appropriate instruments, implants and radiographic studies were available in the room. Once all in attendance were in agreement case began..  Once the reduction was deemed near-anatomic, a small lateral incision was made in line with the femur, distal to the greater trochanter, and 3 threaded guidewires were introduced Into the lateral cortex of the femur, across the fracture site and into the femoral head in an inverted triangle configuration. The lengths of these guidepins were measured with a depth gauge. The lateral cortex was then opened with a cannulated drill, and then the cannulated screws were advanced into position and tightened by hand. Solid fixation was achieved.  The guide pins were then removed and final C-arm images were taken of the fracture fixation. The fracture was well reduced and the hardware in good position.  The wound was irrigated copiously, and the deep and subcutaneous tissues were repaired with 2-0 Vicryl suture respectively and the skin was approximated with staples.  I was scrubbed and present the entire case and all sharp and instrument counts were correct at the conclusion of the case.  I spoke with the patient's father in the postop consultation room to let him know the case was completed without complication and the patient was stable in the recovery room.    Timoteo Gaul, MD

## 2017-12-08 NOTE — Progress Notes (Signed)
Oxygen level ranges 88 to 93  Dr Ronelle Nigh aware instructed to have floor monitor it via pulse oximetry

## 2017-12-08 NOTE — Clinical Social Work Note (Signed)
Clinical Social Work Assessment  Patient Details  Name: Charlene [name] MRN: [id number] Date of Birth: 1961-01-28  Date of referral:  12/08/17               Reason for consult:  Facility Placement                Permission sought to share information with:    Permission granted to share information::     Name::        Agency::     Relationship::     Contact Information:     Housing/Transportation Living arrangements for the past 2 months:  Single Family Home Source of Information:  Patient Patient Interpreter Needed:  None Criminal Activity/Legal Involvement Pertinent to Current Situation/Hospitalization:  No - Comment as needed Significant Relationships:  Parents Lives with:  Parents Do you feel safe going back to the place where you live?  Yes Need for family participation in patient care:  Yes (Comment)  Care giving concerns:  Patient lives in [place] with her mother Charlene [name] and father Charlene [name].    Social Worker assessment / plan:  Holiday representative (CSW) reviewed chart and noted that patient will have surgery today for a hip fracture. CSW met with patient alone at bedside prior to surgery today. Patient was alert and oriented X4 and was laying in the bed. CSW introduced self and explained role of CSW department. Patient reported that she lives in [place] with her parents and receives $800 in disability per month. CSW explained that after surgery PT will evaluate patient and make a recommendation of home health or SNF. Patient prefers to go home and stated that she has been at a family care home and SNF before. Patient reported that she has been living with her parents for 1 year and prior to that was living in a family care home. CSW explained that under medicaid patient would have to stay at the SNF for 30 days, sign over her disability check to the facility and likely go outside of [facility]. Patient verbalized her understanding and prefers to go home. CSW will continue to  follow and assist as needed.   Employment status:  Disabled (Comment on whether or not currently receiving Disability) Insurance information:  Medicaid [place] PT Recommendations:  Not assessed at this time Information / Referral to community resources:  Other (Comment Required)(SNF vs. home health )  Patient/Family's Response to care:  Patient prefers to go home.   Patient/Family's Understanding of and Emotional Response to Diagnosis, Current Treatment, and Prognosis:  Patient was very pleasant and thanked CSW for assistance.   Emotional Assessment Appearance:  Appears stated age Attitude/Demeanor/Rapport:    Affect (typically observed):  Accepting, Adaptable, Pleasant Orientation:  Oriented to Self, Oriented to Place, Oriented to  Time, Oriented to Situation Alcohol / Substance use:  Not Applicable Psych involvement (Current and /or in the community):  No (Comment)  Discharge Needs  Concerns to be addressed:  Discharge Planning Concerns Readmission within the last 30 days:  No Current discharge risk:  Dependent with Mobility Barriers to Discharge:  Continued Medical Work up   [employer], Veronia Beets, LCSW 12/08/2017, 5:40 PM

## 2017-12-08 NOTE — Progress Notes (Signed)
Pt returned to room 135 from PACU around 1730. Pt is A&Ox4, flexes both thighs. Pt does report pain 7/10 in right hip, prn hydrocodone given. Honeycomd dressing CDI to right hip, ice pack in place. Continuous pulse ox monitoring initiated. Pt and family updated on plan of care.   [place], Jerry Caras

## 2017-12-08 NOTE — Consult Note (Signed)
ORTHOPAEDIC CONSULTATION  REQUESTING PHYSICIAN: Bettey Costa, MD  Chief Complaint: Right hip pain  HPI: Charlene [name] is a 57 y.o. female who complains of right hip pain after mechanical fall walking up concrete steps at home.  Patient tripped and landed on her right side injuring her right hip and the right side of her face.  Patient was brought to [facility] emergency department where x-rays confirmed an impacted minimally displaced right femoral neck hip fracture.  Patient explains that she at home with her parents and brother.  She does not use an assist device at baseline.  Past Medical History:  Diagnosis Date  . Asthma   . Bipolar 2 disorder ([place])   . Borderline schizophrenia ([place])   . COPD (chronic obstructive pulmonary disease) ([place])   . Falls infrequently   . Hypertension   . Primary cancer of right lower lobe of lung ([place]) 07/12/2015   Small cell undifferentiated carcinoma of lung.  Diagnosis at [facility] by fine-needle aspiration of lymph node (January, 2017)  . Ulcer    Past Surgical History:  Procedure Laterality Date  . ABDOMINAL HYSTERECTOMY    . APPENDECTOMY    . borderline schizophrenia    . chronic back pain    . NASAL SINUS SURGERY     x2  . PERIPHERAL VASCULAR CATHETERIZATION N/A 07/30/2015   Procedure: Glori Luis Cath Insertion;  Surgeon: Algernon Huxley, MD;  Location: [place] [facility];  Service: Cardiovascular;  Laterality: N/A;  . STOMACH SURGERY     Social History   Socioeconomic History  . Marital status: Divorced    Spouse name: Not on file  . Number of children: Not on file  . Years of education: Not on file  . Highest education level: Not on file  Occupational History  . Not on file  Social Needs  . Financial resource strain: Not on file  . Food insecurity:    Worry: Not on file    Inability: Not on file  . Transportation needs:    Medical: Not on file    Non-medical: Not on file  Tobacco Use  . Smoking status: Former Smoker     Packs/day: 0.50    Years: 30.00    Pack years: 15.00    Last attempt to quit: 06/17/2015    Years since quitting: 2.4  . Smokeless tobacco: Never Used  Substance and Sexual Activity  . Alcohol use: No    Alcohol/week: 0.0 oz  . Drug use: Yes    Types: Cocaine    Comment: States due to cocaine in mouthwash  . Sexual activity: Not on file  Lifestyle  . Physical activity:    Days per week: Not on file    Minutes per session: Not on file  . Stress: Not on file  Relationships  . Social connections:    Talks on phone: Not on file    Gets together: Not on file    Attends religious service: Not on file    Active member of club or organization: Not on file    Attends meetings of clubs or organizations: Not on file    Relationship status: Not on file  Other Topics Concern  . Not on file  Social History Narrative  . Not on file   Family History  Problem Relation Age of Onset  . Hypertension Father    Allergies  Allergen Reactions  . Amoxicillin-Pot Clavulanate Hives    Also vomiting  . Septra [Sulfamethoxazole-Trimethoprim] Hives  and Nausea And Vomiting   Prior to Admission medications   Medication Sig Start Date End Date Taking? Authorizing Provider  acetaminophen (TYLENOL) 500 MG tablet Take 500 mg by mouth every 6 (six) hours as needed.   Yes [provider]  albuterol (PROVENTIL HFA;VENTOLIN HFA) 108 (90 Base) MCG/ACT inhaler Inhale 2 puffs into the lungs every 4 (four) hours as needed for wheezing or shortness of breath.   Yes [provider]  cyclobenzaprine (FLEXERIL) 10 MG tablet Take 10 mg by mouth 2 (two) times daily.  06/26/15  Yes [provider]  diltiazem (CARDIZEM CD) 120 MG 24 hr capsule Take 1 capsule (120 mg total) by mouth daily. 01/11/16  Yes Max Sane, MD  divalproex (DEPAKOTE) 500 MG DR tablet Take 2 tablets (1,000 mg total) by mouth every 12 (twelve) hours. 01/11/16  Yes Max Sane, MD  fluticasone (FLONASE) 50 MCG/ACT nasal  spray 1 spray by Each Nare route daily. 07/03/15 08/23/66 Yes [provider]  Fluticasone-Salmeterol (ADVAIR DISKUS) 500-50 MCG/DOSE AEPB Inhale 1 puff into the lungs 2 (two) times daily. 12/26/16  Yes Cammie Sickle, MD  ipratropium-albuterol (DUONEB) 0.5-2.5 (3) MG/3ML SOLN Take 3 mLs by nebulization every 4 (four) hours as needed. 12/26/16  Yes Cammie Sickle, MD  loratadine (CLARITIN) 10 MG tablet Take 10 mg by mouth daily as needed.    Yes [provider]  Multiple Vitamin (MULTIVITAMIN) tablet Take 1 tablet by mouth daily.   Yes [provider]  omeprazole (PRILOSEC) 20 MG capsule Take 20 mg by mouth daily.   Yes [provider]  risperiDONE (RISPERDAL) 1 MG tablet Take 1 tablet (1 mg total) by mouth at bedtime. 11/24/16  Yes Mody, Ulice Bold, MD  salmeterol (SEREVENT) 50 MCG/DOSE diskus inhaler Inhale 1 puff into the lungs 2 (two) times daily. 07/20/17 07/20/18 Yes [provider]  sucralfate (CARAFATE) 1 g tablet Take 1 tablet (1 g total) by mouth 4 (four) times daily. Dissolve in 2-3 tbsp warm water, swish and swallow. 11/01/15  Yes Choksi, Delorise Shiner, MD  tiotropium (SPIRIVA HANDIHALER) 18 MCG inhalation capsule Place 18 mcg into inhaler and inhale daily.   Yes [provider]  montelukast (SINGULAIR) 10 MG tablet Take 1 tablet (10 mg total) by mouth at bedtime. Patient not taking: Reported on 12/07/2017 11/06/17   Cammie Sickle, MD   Ct Head Wo Contrast  Result Date: 12/07/2017 CLINICAL DATA:  Fall down stairs.  Right facial pain. EXAM: CT HEAD WITHOUT CONTRAST CT MAXILLOFACIAL WITHOUT CONTRAST CT CERVICAL SPINE WITHOUT CONTRAST TECHNIQUE: Multidetector CT imaging of the head, cervical spine, and maxillofacial structures were performed using the standard protocol without intravenous contrast. Multiplanar CT image reconstructions of the cervical spine and maxillofacial structures were also generated. COMPARISON:  Head CT 12/31/2015  FINDINGS: CT HEAD FINDINGS Brain: There is no mass, hemorrhage or extra-axial collection. The size and configuration of the ventricles and extra-axial CSF spaces are normal. There is no acute or chronic infarction. There is hypoattenuation of the periventricular white matter, most commonly indicating chronic ischemic microangiopathy. Vascular: No abnormal hyperdensity of the major intracranial arteries or dural venous sinuses. No intracranial atherosclerosis. Skull: The visualized skull base, calvarium and extracranial soft tissues are normal. CT MAXILLOFACIAL FINDINGS Osseous: --Complex facial fracture types: No LeFort, zygomaticomaxillary complex or nasoorbitoethmoidal fracture. --Simple fracture types: None. --Mandible: No fracture or dislocation. Orbits: The globes are intact. Normal appearance of the intra- and extraconal fat. Symmetric extraocular muscles and optic nerves. Sinuses: Chronic right  maxillary sinusitis. Soft tissues: Right facial hematoma and subcutaneous edema overlying the zygomatic arch. CT CERVICAL SPINE FINDINGS Alignment: No static subluxation. Facets are aligned. Occipital condyles and the lateral masses of C1-C2 are aligned. Skull base and vertebrae: No acute fracture. Soft tissues and spinal canal: No prevertebral fluid or swelling. No visible canal hematoma. Disc levels: Disc space narrowing is greatest at C5-C6. Upper chest: No pneumothorax, pulmonary nodule or pleural effusion. Other: Normal visualized paraspinal cervical soft tissues. IMPRESSION: 1. Advanced sequelae of chronic ischemic microangiopathy without acute intracranial abnormality. Chronic ischemic white matter changes and markedly progressed compared to 12/31/2015. 2. No calvarial or facial fracture. 3. Large right facial hematoma. 4. No acute fracture or static subluxation of the cervical spine. Electronically Signed   By: Ulyses Jarred M.D.   On: 12/07/2017 21:21   Ct Cervical Spine Wo Contrast  Result Date:  12/07/2017 CLINICAL DATA:  Fall down stairs.  Right facial pain. EXAM: CT HEAD WITHOUT CONTRAST CT MAXILLOFACIAL WITHOUT CONTRAST CT CERVICAL SPINE WITHOUT CONTRAST TECHNIQUE: Multidetector CT imaging of the head, cervical spine, and maxillofacial structures were performed using the standard protocol without intravenous contrast. Multiplanar CT image reconstructions of the cervical spine and maxillofacial structures were also generated. COMPARISON:  Head CT 12/31/2015 FINDINGS: CT HEAD FINDINGS Brain: There is no mass, hemorrhage or extra-axial collection. The size and configuration of the ventricles and extra-axial CSF spaces are normal. There is no acute or chronic infarction. There is hypoattenuation of the periventricular white matter, most commonly indicating chronic ischemic microangiopathy. Vascular: No abnormal hyperdensity of the major intracranial arteries or dural venous sinuses. No intracranial atherosclerosis. Skull: The visualized skull base, calvarium and extracranial soft tissues are normal. CT MAXILLOFACIAL FINDINGS Osseous: --Complex facial fracture types: No LeFort, zygomaticomaxillary complex or nasoorbitoethmoidal fracture. --Simple fracture types: None. --Mandible: No fracture or dislocation. Orbits: The globes are intact. Normal appearance of the intra- and extraconal fat. Symmetric extraocular muscles and optic nerves. Sinuses: Chronic right maxillary sinusitis. Soft tissues: Right facial hematoma and subcutaneous edema overlying the zygomatic arch. CT CERVICAL SPINE FINDINGS Alignment: No static subluxation. Facets are aligned. Occipital condyles and the lateral masses of C1-C2 are aligned. Skull base and vertebrae: No acute fracture. Soft tissues and spinal canal: No prevertebral fluid or swelling. No visible canal hematoma. Disc levels: Disc space narrowing is greatest at C5-C6. Upper chest: No pneumothorax, pulmonary nodule or pleural effusion. Other: Normal visualized paraspinal cervical  soft tissues. IMPRESSION: 1. Advanced sequelae of chronic ischemic microangiopathy without acute intracranial abnormality. Chronic ischemic white matter changes and markedly progressed compared to 12/31/2015. 2. No calvarial or facial fracture. 3. Large right facial hematoma. 4. No acute fracture or static subluxation of the cervical spine. Electronically Signed   By: Ulyses Jarred M.D.   On: 12/07/2017 21:21   Ct Hip Right Wo Contrast  Result Date: 12/07/2017 CLINICAL DATA:  Right hip pain after mechanical fall. EXAM: CT OF THE RIGHT HIP WITHOUT CONTRAST TECHNIQUE: Multidetector CT imaging of the right hip was performed according to the standard protocol. Multiplanar CT image reconstructions were also generated. 3D reconstructions were also provided of the right hip. COMPARISON:  Same day radiographs FINDINGS: Bones/Joint/Cartilage There is an acute, closed, subcapital right femoral neck fracture with impaction of the femoral neck on femoral head along its superolateral aspect. Slight caudal displacement of the femoral neck relative to the femoral head. No joint dislocation. Pubic rami appear intact. Ligaments Suboptimally assessed by CT. Muscles and Tendons No intramuscular hemorrhage.  No atrophy.  Soft tissues No subcutaneous soft tissue fluid collection or hematoma. Mild soft tissue contusion along the lateral aspect of the right hip. IMPRESSION: Acute, closed, slightly impacted subcapital right femoral neck fracture with minimal caudal displacement of the femoral neck relative to the head. Electronically Signed   By: Ashley Royalty M.D.   On: 12/07/2017 23:10   Ct 3d Recon At Scanner  Result Date: 12/07/2017 CLINICAL DATA:  Right hip pain after mechanical fall. EXAM: CT OF THE RIGHT HIP WITHOUT CONTRAST TECHNIQUE: Multidetector CT imaging of the right hip was performed according to the standard protocol. Multiplanar CT image reconstructions were also generated. 3D reconstructions were also provided of the  right hip. COMPARISON:  Same day radiographs FINDINGS: Bones/Joint/Cartilage There is an acute, closed, subcapital right femoral neck fracture with impaction of the femoral neck on femoral head along its superolateral aspect. Slight caudal displacement of the femoral neck relative to the femoral head. No joint dislocation. Pubic rami appear intact. Ligaments Suboptimally assessed by CT. Muscles and Tendons No intramuscular hemorrhage.  No atrophy. Soft tissues No subcutaneous soft tissue fluid collection or hematoma. Mild soft tissue contusion along the lateral aspect of the right hip. IMPRESSION: Acute, closed, slightly impacted subcapital right femoral neck fracture with minimal caudal displacement of the femoral neck relative to the head. Electronically Signed   By: Ashley Royalty M.D.   On: 12/07/2017 23:10   Dg Chest Portable 1 View  Result Date: 12/07/2017 CLINICAL DATA:  57 year old female with fall and right hip pain. History of lung cancer. EXAM: PORTABLE CHEST 1 VIEW COMPARISON:  Chest CT dated 11/19/2017 FINDINGS: Right pectoral Port-A-Cath with tip in the region of the cavoatrial junction. Patchy areas of density in the right hilar region similar to prior CT most consistent with post radiation changes. No new consolidative changes. There is no pleural effusion or pneumothorax. The cardiac silhouette is within normal limits. No acute osseous pathology. IMPRESSION: No acute cardiopulmonary process. Electronically Signed   By: Anner Crete M.D.   On: 12/07/2017 21:27   Dg Hip Unilat W Or Wo Pelvis 2-3 Views Right  Result Date: 12/07/2017 CLINICAL DATA:  57 year old female with fall and right hip pain. EXAM: DG HIP (WITH OR WITHOUT PELVIS) 2-3V RIGHT COMPARISON:  None. FINDINGS: There is a minimally displaced fracture of the right femoral neck. There is no dislocation. The bones are mildly osteopenic. Mild arthritic changes of the hips. There is degenerative changes of the lower lumbar spine. The  soft tissues appear unremarkable. IMPRESSION: Minimally displaced fracture of the right femoral neck. Electronically Signed   By: Anner Crete M.D.   On: 12/07/2017 21:25   Ct Maxillofacial Wo Contrast  Result Date: 12/07/2017 CLINICAL DATA:  Fall down stairs.  Right facial pain. EXAM: CT HEAD WITHOUT CONTRAST CT MAXILLOFACIAL WITHOUT CONTRAST CT CERVICAL SPINE WITHOUT CONTRAST TECHNIQUE: Multidetector CT imaging of the head, cervical spine, and maxillofacial structures were performed using the standard protocol without intravenous contrast. Multiplanar CT image reconstructions of the cervical spine and maxillofacial structures were also generated. COMPARISON:  Head CT 12/31/2015 FINDINGS: CT HEAD FINDINGS Brain: There is no mass, hemorrhage or extra-axial collection. The size and configuration of the ventricles and extra-axial CSF spaces are normal. There is no acute or chronic infarction. There is hypoattenuation of the periventricular white matter, most commonly indicating chronic ischemic microangiopathy. Vascular: No abnormal hyperdensity of the major intracranial arteries or dural venous sinuses. No intracranial atherosclerosis. Skull: The visualized skull base, calvarium and extracranial soft tissues  are normal. CT MAXILLOFACIAL FINDINGS Osseous: --Complex facial fracture types: No LeFort, zygomaticomaxillary complex or nasoorbitoethmoidal fracture. --Simple fracture types: None. --Mandible: No fracture or dislocation. Orbits: The globes are intact. Normal appearance of the intra- and extraconal fat. Symmetric extraocular muscles and optic nerves. Sinuses: Chronic right maxillary sinusitis. Soft tissues: Right facial hematoma and subcutaneous edema overlying the zygomatic arch. CT CERVICAL SPINE FINDINGS Alignment: No static subluxation. Facets are aligned. Occipital condyles and the lateral masses of C1-C2 are aligned. Skull base and vertebrae: No acute fracture. Soft tissues and spinal canal: No  prevertebral fluid or swelling. No visible canal hematoma. Disc levels: Disc space narrowing is greatest at C5-C6. Upper chest: No pneumothorax, pulmonary nodule or pleural effusion. Other: Normal visualized paraspinal cervical soft tissues. IMPRESSION: 1. Advanced sequelae of chronic ischemic microangiopathy without acute intracranial abnormality. Chronic ischemic white matter changes and markedly progressed compared to 12/31/2015. 2. No calvarial or facial fracture. 3. Large right facial hematoma. 4. No acute fracture or static subluxation of the cervical spine. Electronically Signed   By: Ulyses Jarred M.D.   On: 12/07/2017 21:21    Positive ROS: All other systems have been reviewed and were otherwise negative with the exception of those mentioned in the HPI and as above.  Physical Exam: General: Alert, no acute distress  MUSCULOSKELETAL: Right hip: Patient's skin overlying the right hip is intact.  There is no erythema ecchymosis or significant swelling.  Her thigh and leg compartments are soft and compressible.  She has palpable pedal pulses, intact sensation light touch and intact motor function distally.  Patient has foot pumps in place.  There is no shortening or external rotation of the right lower extremity.  Assessment: Impacted minimally displaced fracture of the right femoral neck  Plan: I reviewed the details of the injury with the patient.  She understands that she has a minimally displaced and impacted right femoral neck hip fracture.  Given her age and activity level at baseline I am recommending percutaneous fixation for this fracture.  We discussed the details of this operation as well as the postoperative course.  We also discussed the potential complications which include but are not limited to infection, bleeding requiring blood transfusion, nerve or blood vessel injury, malunion, nonunion, avascular necrosis, hardware failure and the need for further surgery including potential  conversion to a hemi-versus total arthroplasty.  Medical risks include but are not limited to DVT and pulmonary embolism, myocardial infarction, stroke, pneumonia, respiratory failure and death.  Patient understood these risks and wished to proceed.  She has been cleared by the hospitalist service for surgery.  I reviewed her x-rays, CT scan of the right hip and labs operation for this case.    Thornton Park, MD    12/08/2017 1:52 PM

## 2017-12-08 NOTE — Anesthesia Preprocedure Evaluation (Signed)
Anesthesia Evaluation  Patient identified by MRN, date of birth, ID band Patient awake    Reviewed: Allergy & Precautions, H&P , NPO status , reviewed documented beta blocker date and time   Airway Mallampati: II  TM Distance: >3 FB Neck ROM: full    Dental  (+) Upper Dentures, Edentulous Lower   Pulmonary asthma , pneumonia, resolved, COPD, former smoker,     + decreased breath sounds      Cardiovascular hypertension,  Rhythm:regular     Neuro/Psych PSYCHIATRIC DISORDERS Anxiety Bipolar Disorder Schizophrenia    GI/Hepatic   Endo/Other    Renal/GU      Musculoskeletal   Abdominal   Peds  Hematology  (+) anemia ,   Anesthesia Other Findings Past Medical History: No date: Asthma No date: Bipolar 2 disorder ([facility]) No date: Borderline schizophrenia ([place]) No date: COPD (chronic obstructive pulmonary disease) ([place]) No date: Falls infrequently No date: Hypertension 07/12/2015: Primary cancer of right lower lobe of lung ([place])     Comment:  Small cell undifferentiated carcinoma of lung.                Diagnosis at [facility] by fine-needle aspiration of              lymph node (January, 2017) No date: Ulcer  Past Surgical History: No date: ABDOMINAL HYSTERECTOMY No date: APPENDECTOMY No date: borderline schizophrenia No date: chronic back pain No date: NASAL SINUS SURGERY     Comment:  x2 07/30/2015: PERIPHERAL VASCULAR CATHETERIZATION; N/A     Comment:  Procedure: Porta Cath Insertion;  Surgeon: Algernon Huxley,               MD;  Location: [place] [facility];  Service:               Cardiovascular;  Laterality: N/A; No date: STOMACH SURGERY  BMI    Body Mass Index:  25.85 kg/m      Reproductive/Obstetrics                             Anesthesia Physical Anesthesia Plan  ASA: III  Anesthesia Plan: Spinal   Post-op Pain Management:    Induction:   PONV Risk Score and Plan:  Treatment may vary due to age or medical condition, TIVA, Ondansetron and Midazolam  Airway Management Planned:   Additional Equipment:   Intra-op Plan:   Post-operative Plan:   Informed Consent: I have reviewed the patients History and Physical, chart, labs and discussed the procedure including the risks, benefits and alternatives for the proposed anesthesia with the patient or authorized representative who has indicated his/her understanding and acceptance.   Dental Advisory Given  Plan Discussed with: CRNA  Anesthesia Plan Comments:         Anesthesia Quick Evaluation

## 2017-12-08 NOTE — Progress Notes (Signed)
Subjective:  POST-OP CHECK:  Patient reports right hip pain as mild to moderate.  Spinal block is still working.  Objective:   VITALS:   Vitals:   12/08/17 1653 12/08/17 1702 12/08/17 1703 12/08/17 1746  BP: 104/76  112/78 119/80  Pulse: 83 84 84 98  Resp: 13 19 13 14   Temp:   (!) 97.3 F (36.3 C)   TempSrc:      SpO2: 93% 94% 94% 93%  Weight:      Height:        PHYSICAL EXAM: Right lower extremity: Motor and sensory function still diminished due to spinal block. Intact pulses distally Incision: dressing C/D/I No cellulitis present Compartment soft  LABS  Results for orders placed or performed during the hospital encounter of 12/07/17 (from the past 24 hour(s))  CBC with Differential/Platelet     Status: Abnormal   Collection Time: 12/07/17  8:13 PM  Result Value Ref Range   WBC 8.0 3.6 - 11.0 K/uL   RBC 3.84 3.80 - 5.20 MIL/uL   Hemoglobin 12.6 12.0 - 16.0 g/dL   HCT 36.5 35.0 - 47.0 %   MCV 95.1 80.0 - 100.0 fL   MCH 32.7 26.0 - 34.0 pg   MCHC 34.4 32.0 - 36.0 g/dL   RDW 15.0 (H) 11.5 - 14.5 %   Platelets 210 150 - 440 K/uL   Neutrophils Relative % 73 %   Neutro Abs 6.0 1.4 - 6.5 K/uL   Lymphocytes Relative 18 %   Lymphs Abs 1.4 1.0 - 3.6 K/uL   Monocytes Relative 7 %   Monocytes Absolute 0.6 0.2 - 0.9 K/uL   Eosinophils Relative 1 %   Eosinophils Absolute 0.1 0 - 0.7 K/uL   Basophils Relative 1 %   Basophils Absolute 0.0 0 - 0.1 K/uL  Basic metabolic panel     Status: Abnormal   Collection Time: 12/07/17  8:13 PM  Result Value Ref Range   Sodium 132 (L) 135 - 145 mmol/L   Potassium 4.4 3.5 - 5.1 mmol/L   Chloride 99 (L) 101 - 111 mmol/L   CO2 23 22 - 32 mmol/L   Glucose, Bld 106 (H) 65 - 99 mg/dL   BUN 14 6 - 20 mg/dL   Creatinine, Ser 0.91 0.44 - 1.00 mg/dL   Calcium 8.6 (L) 8.9 - 10.3 mg/dL   GFR calc non Af Amer >60 >60 mL/min   GFR calc Af Amer >60 >60 mL/min   Anion gap 10 5 - 15  Protime-INR     Status: None   Collection Time: 12/07/17   8:13 PM  Result Value Ref Range   Prothrombin Time 13.2 11.4 - 15.2 seconds   INR 1.01   APTT     Status: None   Collection Time: 12/07/17  8:13 PM  Result Value Ref Range   aPTT 33 24 - 36 seconds  Type and screen Claude     Status: None   Collection Time: 12/07/17  8:38 PM  Result Value Ref Range   ABO/RH(D) O POS    Antibody Screen NEG    Sample Expiration      12/10/2017 Performed at [facility], [address] [place], [place] [address]   Basic metabolic panel     Status: Abnormal   Collection Time: 12/08/17  3:10 AM  Result Value Ref Range   Sodium 134 (L) 135 - 145 mmol/L   Potassium 4.1 3.5 - 5.1 mmol/L  Chloride 99 98 - 111 mmol/L   CO2 27 22 - 32 mmol/L   Glucose, Bld 110 (H) 70 - 99 mg/dL   BUN 12 6 - 20 mg/dL   Creatinine, Ser 0.66 0.44 - 1.00 mg/dL   Calcium 8.7 (L) 8.9 - 10.3 mg/dL   GFR calc non Af Amer >60 >60 mL/min   GFR calc Af Amer >60 >60 mL/min   Anion gap 8 5 - 15  CBC     Status: Abnormal   Collection Time: 12/08/17  3:10 AM  Result Value Ref Range   WBC 11.0 3.6 - 11.0 K/uL   RBC 3.75 (L) 3.80 - 5.20 MIL/uL   Hemoglobin 12.1 12.0 - 16.0 g/dL   HCT 35.7 35.0 - 47.0 %   MCV 95.1 80.0 - 100.0 fL   MCH 32.3 26.0 - 34.0 pg   MCHC 33.9 32.0 - 36.0 g/dL   RDW 14.9 (H) 11.5 - 14.5 %   Platelets 202 150 - 440 K/uL  Surgical pcr screen     Status: Abnormal   Collection Time: 12/08/17  3:16 AM  Result Value Ref Range   MRSA, PCR POSITIVE (A) NEGATIVE   Staphylococcus aureus POSITIVE (A) NEGATIVE  Urinalysis, Complete w Microscopic     Status: Abnormal   Collection Time: 12/08/17  5:56 AM  Result Value Ref Range   Color, Urine YELLOW (A) YELLOW   APPearance CLEAR (A) CLEAR   Specific Gravity, Urine 1.011 1.005 - 1.030   pH 8.0 5.0 - 8.0   Glucose, UA NEGATIVE NEGATIVE mg/dL   Hgb urine dipstick NEGATIVE NEGATIVE   Bilirubin Urine NEGATIVE NEGATIVE   Ketones, ur NEGATIVE NEGATIVE mg/dL   Protein, ur NEGATIVE  NEGATIVE mg/dL   Nitrite NEGATIVE NEGATIVE   Leukocytes, UA NEGATIVE NEGATIVE   RBC / HPF 0-5 0 - 5 RBC/hpf   WBC, UA 0-5 0 - 5 WBC/hpf   Bacteria, UA NONE SEEN NONE SEEN   Squamous Epithelial / LPF NONE SEEN 0 - 5   Mucus PRESENT   Glucose, capillary     Status: Abnormal   Collection Time: 12/08/17  8:30 AM  Result Value Ref Range   Glucose-Capillary 101 (H) 70 - 99 mg/dL    Ct Head Wo Contrast  Result Date: 12/07/2017 CLINICAL DATA:  Fall down stairs.  Right facial pain. EXAM: CT HEAD WITHOUT CONTRAST CT MAXILLOFACIAL WITHOUT CONTRAST CT CERVICAL SPINE WITHOUT CONTRAST TECHNIQUE: Multidetector CT imaging of the head, cervical spine, and maxillofacial structures were performed using the standard protocol without intravenous contrast. Multiplanar CT image reconstructions of the cervical spine and maxillofacial structures were also generated. COMPARISON:  Head CT 12/31/2015 FINDINGS: CT HEAD FINDINGS Brain: There is no mass, hemorrhage or extra-axial collection. The size and configuration of the ventricles and extra-axial CSF spaces are normal. There is no acute or chronic infarction. There is hypoattenuation of the periventricular white matter, most commonly indicating chronic ischemic microangiopathy. Vascular: No abnormal hyperdensity of the major intracranial arteries or dural venous sinuses. No intracranial atherosclerosis. Skull: The visualized skull base, calvarium and extracranial soft tissues are normal. CT MAXILLOFACIAL FINDINGS Osseous: --Complex facial fracture types: No LeFort, zygomaticomaxillary complex or nasoorbitoethmoidal fracture. --Simple fracture types: None. --Mandible: No fracture or dislocation. Orbits: The globes are intact. Normal appearance of the intra- and extraconal fat. Symmetric extraocular muscles and optic nerves. Sinuses: Chronic right maxillary sinusitis. Soft tissues: Right facial hematoma and subcutaneous edema overlying the zygomatic arch. CT CERVICAL SPINE  FINDINGS Alignment: No static  subluxation. Facets are aligned. Occipital condyles and the lateral masses of C1-C2 are aligned. Skull base and vertebrae: No acute fracture. Soft tissues and spinal canal: No prevertebral fluid or swelling. No visible canal hematoma. Disc levels: Disc space narrowing is greatest at C5-C6. Upper chest: No pneumothorax, pulmonary nodule or pleural effusion. Other: Normal visualized paraspinal cervical soft tissues. IMPRESSION: 1. Advanced sequelae of chronic ischemic microangiopathy without acute intracranial abnormality. Chronic ischemic white matter changes and markedly progressed compared to 12/31/2015. 2. No calvarial or facial fracture. 3. Large right facial hematoma. 4. No acute fracture or static subluxation of the cervical spine. Electronically Signed   By: Ulyses Jarred M.D.   On: 12/07/2017 21:21   Ct Cervical Spine Wo Contrast  Result Date: 12/07/2017 CLINICAL DATA:  Fall down stairs.  Right facial pain. EXAM: CT HEAD WITHOUT CONTRAST CT MAXILLOFACIAL WITHOUT CONTRAST CT CERVICAL SPINE WITHOUT CONTRAST TECHNIQUE: Multidetector CT imaging of the head, cervical spine, and maxillofacial structures were performed using the standard protocol without intravenous contrast. Multiplanar CT image reconstructions of the cervical spine and maxillofacial structures were also generated. COMPARISON:  Head CT 12/31/2015 FINDINGS: CT HEAD FINDINGS Brain: There is no mass, hemorrhage or extra-axial collection. The size and configuration of the ventricles and extra-axial CSF spaces are normal. There is no acute or chronic infarction. There is hypoattenuation of the periventricular white matter, most commonly indicating chronic ischemic microangiopathy. Vascular: No abnormal hyperdensity of the major intracranial arteries or dural venous sinuses. No intracranial atherosclerosis. Skull: The visualized skull base, calvarium and extracranial soft tissues are normal. CT MAXILLOFACIAL FINDINGS  Osseous: --Complex facial fracture types: No LeFort, zygomaticomaxillary complex or nasoorbitoethmoidal fracture. --Simple fracture types: None. --Mandible: No fracture or dislocation. Orbits: The globes are intact. Normal appearance of the intra- and extraconal fat. Symmetric extraocular muscles and optic nerves. Sinuses: Chronic right maxillary sinusitis. Soft tissues: Right facial hematoma and subcutaneous edema overlying the zygomatic arch. CT CERVICAL SPINE FINDINGS Alignment: No static subluxation. Facets are aligned. Occipital condyles and the lateral masses of C1-C2 are aligned. Skull base and vertebrae: No acute fracture. Soft tissues and spinal canal: No prevertebral fluid or swelling. No visible canal hematoma. Disc levels: Disc space narrowing is greatest at C5-C6. Upper chest: No pneumothorax, pulmonary nodule or pleural effusion. Other: Normal visualized paraspinal cervical soft tissues. IMPRESSION: 1. Advanced sequelae of chronic ischemic microangiopathy without acute intracranial abnormality. Chronic ischemic white matter changes and markedly progressed compared to 12/31/2015. 2. No calvarial or facial fracture. 3. Large right facial hematoma. 4. No acute fracture or static subluxation of the cervical spine. Electronically Signed   By: Ulyses Jarred M.D.   On: 12/07/2017 21:21   Ct Hip Right Wo Contrast  Result Date: 12/07/2017 CLINICAL DATA:  Right hip pain after mechanical fall. EXAM: CT OF THE RIGHT HIP WITHOUT CONTRAST TECHNIQUE: Multidetector CT imaging of the right hip was performed according to the standard protocol. Multiplanar CT image reconstructions were also generated. 3D reconstructions were also provided of the right hip. COMPARISON:  Same day radiographs FINDINGS: Bones/Joint/Cartilage There is an acute, closed, subcapital right femoral neck fracture with impaction of the femoral neck on femoral head along its superolateral aspect. Slight caudal displacement of the femoral neck  relative to the femoral head. No joint dislocation. Pubic rami appear intact. Ligaments Suboptimally assessed by CT. Muscles and Tendons No intramuscular hemorrhage.  No atrophy. Soft tissues No subcutaneous soft tissue fluid collection or hematoma. Mild soft tissue contusion along the lateral aspect of the right  hip. IMPRESSION: Acute, closed, slightly impacted subcapital right femoral neck fracture with minimal caudal displacement of the femoral neck relative to the head. Electronically Signed   By: Ashley Royalty M.D.   On: 12/07/2017 23:10   Ct 3d Recon At Scanner  Result Date: 12/07/2017 CLINICAL DATA:  Right hip pain after mechanical fall. EXAM: CT OF THE RIGHT HIP WITHOUT CONTRAST TECHNIQUE: Multidetector CT imaging of the right hip was performed according to the standard protocol. Multiplanar CT image reconstructions were also generated. 3D reconstructions were also provided of the right hip. COMPARISON:  Same day radiographs FINDINGS: Bones/Joint/Cartilage There is an acute, closed, subcapital right femoral neck fracture with impaction of the femoral neck on femoral head along its superolateral aspect. Slight caudal displacement of the femoral neck relative to the femoral head. No joint dislocation. Pubic rami appear intact. Ligaments Suboptimally assessed by CT. Muscles and Tendons No intramuscular hemorrhage.  No atrophy. Soft tissues No subcutaneous soft tissue fluid collection or hematoma. Mild soft tissue contusion along the lateral aspect of the right hip. IMPRESSION: Acute, closed, slightly impacted subcapital right femoral neck fracture with minimal caudal displacement of the femoral neck relative to the head. Electronically Signed   By: Ashley Royalty M.D.   On: 12/07/2017 23:10   Dg Chest Portable 1 View  Result Date: 12/07/2017 CLINICAL DATA:  57 year old female with fall and right hip pain. History of lung cancer. EXAM: PORTABLE CHEST 1 VIEW COMPARISON:  Chest CT dated 11/19/2017 FINDINGS:  Right pectoral Port-A-Cath with tip in the region of the cavoatrial junction. Patchy areas of density in the right hilar region similar to prior CT most consistent with post radiation changes. No new consolidative changes. There is no pleural effusion or pneumothorax. The cardiac silhouette is within normal limits. No acute osseous pathology. IMPRESSION: No acute cardiopulmonary process. Electronically Signed   By: Anner Crete M.D.   On: 12/07/2017 21:27   Dg Hip Port Unilat With Pelvis 1v Right  Result Date: 12/08/2017 CLINICAL DATA:  Hip fracture EXAM: DG HIP (WITH OR WITHOUT PELVIS) 1V PORT RIGHT COMPARISON:  12/07/2017 FINDINGS: Interval ORIF right subcapital femoral neck fracture. Normal alignment. Three screws across the fracture in good position. IMPRESSION: Satisfactory fixation of right femoral neck fracture. Electronically Signed   By: Franchot Gallo M.D.   On: 12/08/2017 16:42   Dg Hip Operative Unilat W Or W/o Pelvis Right  Result Date: 12/08/2017 CLINICAL DATA:  57 year old female with right femoral neck/subcapital fracture. Subsequent encounter. EXAM: OPERATIVE right HIP (WITH PELVIS IF PERFORMED) four VIEWS TECHNIQUE: Fluoroscopic spot image(s) were submitted for interpretation post-operatively. Fluoroscopic time: 1 minutes and 28 seconds. COMPARISON:  12/07/2017 CT and plain film exam. FINDINGS: Three screws transfix right subcapital/femoral neck fracture. No complication noted. IMPRESSION: Three screws transfix right subcapital/femoral neck fracture. No complication noted. Electronically Signed   By: Genia Del M.D.   On: 12/08/2017 15:47   Dg Hip Unilat W Or Wo Pelvis 2-3 Views Right  Result Date: 12/07/2017 CLINICAL DATA:  57 year old female with fall and right hip pain. EXAM: DG HIP (WITH OR WITHOUT PELVIS) 2-3V RIGHT COMPARISON:  None. FINDINGS: There is a minimally displaced fracture of the right femoral neck. There is no dislocation. The bones are mildly osteopenic. Mild  arthritic changes of the hips. There is degenerative changes of the lower lumbar spine. The soft tissues appear unremarkable. IMPRESSION: Minimally displaced fracture of the right femoral neck. Electronically Signed   By: Anner Crete M.D.   On: 12/07/2017  21:25   Ct Maxillofacial Wo Contrast  Result Date: 12/07/2017 CLINICAL DATA:  Fall down stairs.  Right facial pain. EXAM: CT HEAD WITHOUT CONTRAST CT MAXILLOFACIAL WITHOUT CONTRAST CT CERVICAL SPINE WITHOUT CONTRAST TECHNIQUE: Multidetector CT imaging of the head, cervical spine, and maxillofacial structures were performed using the standard protocol without intravenous contrast. Multiplanar CT image reconstructions of the cervical spine and maxillofacial structures were also generated. COMPARISON:  Head CT 12/31/2015 FINDINGS: CT HEAD FINDINGS Brain: There is no mass, hemorrhage or extra-axial collection. The size and configuration of the ventricles and extra-axial CSF spaces are normal. There is no acute or chronic infarction. There is hypoattenuation of the periventricular white matter, most commonly indicating chronic ischemic microangiopathy. Vascular: No abnormal hyperdensity of the major intracranial arteries or dural venous sinuses. No intracranial atherosclerosis. Skull: The visualized skull base, calvarium and extracranial soft tissues are normal. CT MAXILLOFACIAL FINDINGS Osseous: --Complex facial fracture types: No LeFort, zygomaticomaxillary complex or nasoorbitoethmoidal fracture. --Simple fracture types: None. --Mandible: No fracture or dislocation. Orbits: The globes are intact. Normal appearance of the intra- and extraconal fat. Symmetric extraocular muscles and optic nerves. Sinuses: Chronic right maxillary sinusitis. Soft tissues: Right facial hematoma and subcutaneous edema overlying the zygomatic arch. CT CERVICAL SPINE FINDINGS Alignment: No static subluxation. Facets are aligned. Occipital condyles and the lateral masses of C1-C2 are  aligned. Skull base and vertebrae: No acute fracture. Soft tissues and spinal canal: No prevertebral fluid or swelling. No visible canal hematoma. Disc levels: Disc space narrowing is greatest at C5-C6. Upper chest: No pneumothorax, pulmonary nodule or pleural effusion. Other: Normal visualized paraspinal cervical soft tissues. IMPRESSION: 1. Advanced sequelae of chronic ischemic microangiopathy without acute intracranial abnormality. Chronic ischemic white matter changes and markedly progressed compared to 12/31/2015. 2. No calvarial or facial fracture. 3. Large right facial hematoma. 4. No acute fracture or static subluxation of the cervical spine. Electronically Signed   By: Ulyses Jarred M.D.   On: 12/07/2017 21:21    Assessment/Plan: Day of Surgery   Active Problems:   Hip fx [facility])  Patient doing well postop.  Patient will receive 24 hours postop antibiotics.  She will begin Lovenox for DVT prophylaxis tomorrow.  I reviewed the postop x-rays which show the cannulated screws are well-positioned.  The fracture is also well positioned.  Labs will be rechecked in the morning.  Physical therapy will be started in the morning.   Thornton Park , MD 12/08/2017, 6:07 PM

## 2017-12-09 ENCOUNTER — Encounter: Payer: Self-pay | Admitting: Orthopedic Surgery

## 2017-12-09 LAB — BASIC METABOLIC PANEL
ANION GAP: 6 (ref 5–15)
BUN: 7 mg/dL (ref 6–20)
CALCIUM: 8.1 mg/dL — AB (ref 8.9–10.3)
CO2: 25 mmol/L (ref 22–32)
CREATININE: 0.52 mg/dL (ref 0.44–1.00)
Chloride: 102 mmol/L (ref 98–111)
GFR calc non Af Amer: >60 mL/min (ref >60)
Glucose, Bld: 107 mg/dL — ABNORMAL HIGH (ref 70–99)
Potassium: 4.2 mmol/L (ref 3.5–5.1)
Sodium: 133 mmol/L — ABNORMAL LOW (ref 135–145)

## 2017-12-09 LAB — CBC
HCT: 33.7 % — ABNORMAL LOW (ref 35.0–47.0)
HEMOGLOBIN: 11.4 g/dL — AB (ref 12.0–16.0)
MCH: 32.8 pg (ref 26.0–34.0)
MCHC: 33.9 g/dL (ref 32.0–36.0)
MCV: 96.8 fL (ref 80.0–100.0)
Platelets: 165 10*3/uL (ref 150–440)
RBC: 3.48 MIL/uL — ABNORMAL LOW (ref 3.80–5.20)
RDW: 14.6 % — AB (ref 11.5–14.5)
WBC: 8.2 10*3/uL (ref 3.6–11.0)

## 2017-12-09 LAB — HIV ANTIBODY (ROUTINE TESTING W REFLEX): HIV Screen 4th Generation wRfx: NONREACTIVE

## 2017-12-09 LAB — GLUCOSE, CAPILLARY: GLUCOSE-CAPILLARY: 95 mg/dL (ref 70–99)

## 2017-12-09 NOTE — Progress Notes (Signed)
Subjective:  POD #1 s/p percutaneous excision of right impacted femoral neck hip fracture.  Patient reports right hip pain as mild to moderate.    Objective:   VITALS:   Vitals:   12/09/17 0818 12/09/17 1528 12/09/17 1529 12/09/17 1540  BP: 120/74   126/84  Pulse: 92   92  Resp: 18   18  Temp: (!) 97.3 F (36.3 C)   97.7 F (36.5 C)  TempSrc: Oral   Oral  SpO2: 99% (!) 82% 95% 91%  Weight:      Height:        PHYSICAL EXAM: Right lower extremity: Neurovascular intact Sensation intact distally Intact pulses distally Dorsiflexion/Plantar flexion intact Incision: scant drainage No cellulitis present Compartment soft  LABS  Results for orders placed or performed during the hospital encounter of 12/07/17 (from the past 24 hour(s))  CBC     Status: Abnormal   Collection Time: 12/09/17  4:09 AM  Result Value Ref Range   WBC 8.2 3.6 - 11.0 K/uL   RBC 3.48 (L) 3.80 - 5.20 MIL/uL   Hemoglobin 11.4 (L) 12.0 - 16.0 g/dL   HCT 33.7 (L) 35.0 - 47.0 %   MCV 96.8 80.0 - 100.0 fL   MCH 32.8 26.0 - 34.0 pg   MCHC 33.9 32.0 - 36.0 g/dL   RDW 14.6 (H) 11.5 - 14.5 %   Platelets 165 150 - 440 K/uL  Basic metabolic panel     Status: Abnormal   Collection Time: 12/09/17  4:09 AM  Result Value Ref Range   Sodium 133 (L) 135 - 145 mmol/L   Potassium 4.2 3.5 - 5.1 mmol/L   Chloride 102 98 - 111 mmol/L   CO2 25 22 - 32 mmol/L   Glucose, Bld 107 (H) 70 - 99 mg/dL   BUN 7 6 - 20 mg/dL   Creatinine, Ser 0.52 0.44 - 1.00 mg/dL   Calcium 8.1 (L) 8.9 - 10.3 mg/dL   GFR calc non Af Amer >60 >60 mL/min   GFR calc Af Amer >60 >60 mL/min   Anion gap 6 5 - 15  Glucose, capillary     Status: None   Collection Time: 12/09/17  8:17 AM  Result Value Ref Range   Glucose-Capillary 95 70 - 99 mg/dL    Ct Head Wo Contrast  Result Date: 12/07/2017 CLINICAL DATA:  Fall down stairs.  Right facial pain. EXAM: CT HEAD WITHOUT CONTRAST CT MAXILLOFACIAL WITHOUT CONTRAST CT CERVICAL SPINE WITHOUT  CONTRAST TECHNIQUE: Multidetector CT imaging of the head, cervical spine, and maxillofacial structures were performed using the standard protocol without intravenous contrast. Multiplanar CT image reconstructions of the cervical spine and maxillofacial structures were also generated. COMPARISON:  Head CT 12/31/2015 FINDINGS: CT HEAD FINDINGS Brain: There is no mass, hemorrhage or extra-axial collection. The size and configuration of the ventricles and extra-axial CSF spaces are normal. There is no acute or chronic infarction. There is hypoattenuation of the periventricular white matter, most commonly indicating chronic ischemic microangiopathy. Vascular: No abnormal hyperdensity of the major intracranial arteries or dural venous sinuses. No intracranial atherosclerosis. Skull: The visualized skull base, calvarium and extracranial soft tissues are normal. CT MAXILLOFACIAL FINDINGS Osseous: --Complex facial fracture types: No LeFort, zygomaticomaxillary complex or nasoorbitoethmoidal fracture. --Simple fracture types: None. --Mandible: No fracture or dislocation. Orbits: The globes are intact. Normal appearance of the intra- and extraconal fat. Symmetric extraocular muscles and optic nerves. Sinuses: Chronic right maxillary sinusitis. Soft tissues: Right facial hematoma and subcutaneous edema  overlying the zygomatic arch. CT CERVICAL SPINE FINDINGS Alignment: No static subluxation. Facets are aligned. Occipital condyles and the lateral masses of C1-C2 are aligned. Skull base and vertebrae: No acute fracture. Soft tissues and spinal canal: No prevertebral fluid or swelling. No visible canal hematoma. Disc levels: Disc space narrowing is greatest at C5-C6. Upper chest: No pneumothorax, pulmonary nodule or pleural effusion. Other: Normal visualized paraspinal cervical soft tissues. IMPRESSION: 1. Advanced sequelae of chronic ischemic microangiopathy without acute intracranial abnormality. Chronic ischemic white matter  changes and markedly progressed compared to 12/31/2015. 2. No calvarial or facial fracture. 3. Large right facial hematoma. 4. No acute fracture or static subluxation of the cervical spine. Electronically Signed   By: Ulyses Jarred M.D.   On: 12/07/2017 21:21   Ct Cervical Spine Wo Contrast  Result Date: 12/07/2017 CLINICAL DATA:  Fall down stairs.  Right facial pain. EXAM: CT HEAD WITHOUT CONTRAST CT MAXILLOFACIAL WITHOUT CONTRAST CT CERVICAL SPINE WITHOUT CONTRAST TECHNIQUE: Multidetector CT imaging of the head, cervical spine, and maxillofacial structures were performed using the standard protocol without intravenous contrast. Multiplanar CT image reconstructions of the cervical spine and maxillofacial structures were also generated. COMPARISON:  Head CT 12/31/2015 FINDINGS: CT HEAD FINDINGS Brain: There is no mass, hemorrhage or extra-axial collection. The size and configuration of the ventricles and extra-axial CSF spaces are normal. There is no acute or chronic infarction. There is hypoattenuation of the periventricular white matter, most commonly indicating chronic ischemic microangiopathy. Vascular: No abnormal hyperdensity of the major intracranial arteries or dural venous sinuses. No intracranial atherosclerosis. Skull: The visualized skull base, calvarium and extracranial soft tissues are normal. CT MAXILLOFACIAL FINDINGS Osseous: --Complex facial fracture types: No LeFort, zygomaticomaxillary complex or nasoorbitoethmoidal fracture. --Simple fracture types: None. --Mandible: No fracture or dislocation. Orbits: The globes are intact. Normal appearance of the intra- and extraconal fat. Symmetric extraocular muscles and optic nerves. Sinuses: Chronic right maxillary sinusitis. Soft tissues: Right facial hematoma and subcutaneous edema overlying the zygomatic arch. CT CERVICAL SPINE FINDINGS Alignment: No static subluxation. Facets are aligned. Occipital condyles and the lateral masses of C1-C2 are  aligned. Skull base and vertebrae: No acute fracture. Soft tissues and spinal canal: No prevertebral fluid or swelling. No visible canal hematoma. Disc levels: Disc space narrowing is greatest at C5-C6. Upper chest: No pneumothorax, pulmonary nodule or pleural effusion. Other: Normal visualized paraspinal cervical soft tissues. IMPRESSION: 1. Advanced sequelae of chronic ischemic microangiopathy without acute intracranial abnormality. Chronic ischemic white matter changes and markedly progressed compared to 12/31/2015. 2. No calvarial or facial fracture. 3. Large right facial hematoma. 4. No acute fracture or static subluxation of the cervical spine. Electronically Signed   By: Ulyses Jarred M.D.   On: 12/07/2017 21:21   Ct Hip Right Wo Contrast  Result Date: 12/07/2017 CLINICAL DATA:  Right hip pain after mechanical fall. EXAM: CT OF THE RIGHT HIP WITHOUT CONTRAST TECHNIQUE: Multidetector CT imaging of the right hip was performed according to the standard protocol. Multiplanar CT image reconstructions were also generated. 3D reconstructions were also provided of the right hip. COMPARISON:  Same day radiographs FINDINGS: Bones/Joint/Cartilage There is an acute, closed, subcapital right femoral neck fracture with impaction of the femoral neck on femoral head along its superolateral aspect. Slight caudal displacement of the femoral neck relative to the femoral head. No joint dislocation. Pubic rami appear intact. Ligaments Suboptimally assessed by CT. Muscles and Tendons No intramuscular hemorrhage.  No atrophy. Soft tissues No subcutaneous soft tissue fluid collection or hematoma.  Mild soft tissue contusion along the lateral aspect of the right hip. IMPRESSION: Acute, closed, slightly impacted subcapital right femoral neck fracture with minimal caudal displacement of the femoral neck relative to the head. Electronically Signed   By: Ashley Royalty M.D.   On: 12/07/2017 23:10   Ct 3d Recon At Scanner  Result  Date: 12/07/2017 CLINICAL DATA:  Right hip pain after mechanical fall. EXAM: CT OF THE RIGHT HIP WITHOUT CONTRAST TECHNIQUE: Multidetector CT imaging of the right hip was performed according to the standard protocol. Multiplanar CT image reconstructions were also generated. 3D reconstructions were also provided of the right hip. COMPARISON:  Same day radiographs FINDINGS: Bones/Joint/Cartilage There is an acute, closed, subcapital right femoral neck fracture with impaction of the femoral neck on femoral head along its superolateral aspect. Slight caudal displacement of the femoral neck relative to the femoral head. No joint dislocation. Pubic rami appear intact. Ligaments Suboptimally assessed by CT. Muscles and Tendons No intramuscular hemorrhage.  No atrophy. Soft tissues No subcutaneous soft tissue fluid collection or hematoma. Mild soft tissue contusion along the lateral aspect of the right hip. IMPRESSION: Acute, closed, slightly impacted subcapital right femoral neck fracture with minimal caudal displacement of the femoral neck relative to the head. Electronically Signed   By: Ashley Royalty M.D.   On: 12/07/2017 23:10   Dg Chest Portable 1 View  Result Date: 12/07/2017 CLINICAL DATA:  57 year old female with fall and right hip pain. History of lung cancer. EXAM: PORTABLE CHEST 1 VIEW COMPARISON:  Chest CT dated 11/19/2017 FINDINGS: Right pectoral Port-A-Cath with tip in the region of the cavoatrial junction. Patchy areas of density in the right hilar region similar to prior CT most consistent with post radiation changes. No new consolidative changes. There is no pleural effusion or pneumothorax. The cardiac silhouette is within normal limits. No acute osseous pathology. IMPRESSION: No acute cardiopulmonary process. Electronically Signed   By: Anner Crete M.D.   On: 12/07/2017 21:27   Dg Hip Port Unilat With Pelvis 1v Right  Result Date: 12/08/2017 CLINICAL DATA:  Hip fracture EXAM: DG HIP (WITH OR  WITHOUT PELVIS) 1V PORT RIGHT COMPARISON:  12/07/2017 FINDINGS: Interval ORIF right subcapital femoral neck fracture. Normal alignment. Three screws across the fracture in good position. IMPRESSION: Satisfactory fixation of right femoral neck fracture. Electronically Signed   By: Franchot Gallo M.D.   On: 12/08/2017 16:42   Dg Hip Operative Unilat W Or W/o Pelvis Right  Result Date: 12/08/2017 CLINICAL DATA:  57 year old female with right femoral neck/subcapital fracture. Subsequent encounter. EXAM: OPERATIVE right HIP (WITH PELVIS IF PERFORMED) four VIEWS TECHNIQUE: Fluoroscopic spot image(s) were submitted for interpretation post-operatively. Fluoroscopic time: 1 minutes and 28 seconds. COMPARISON:  12/07/2017 CT and plain film exam. FINDINGS: Three screws transfix right subcapital/femoral neck fracture. No complication noted. IMPRESSION: Three screws transfix right subcapital/femoral neck fracture. No complication noted. Electronically Signed   By: Genia Del M.D.   On: 12/08/2017 15:47   Dg Hip Unilat W Or Wo Pelvis 2-3 Views Right  Result Date: 12/07/2017 CLINICAL DATA:  57 year old female with fall and right hip pain. EXAM: DG HIP (WITH OR WITHOUT PELVIS) 2-3V RIGHT COMPARISON:  None. FINDINGS: There is a minimally displaced fracture of the right femoral neck. There is no dislocation. The bones are mildly osteopenic. Mild arthritic changes of the hips. There is degenerative changes of the lower lumbar spine. The soft tissues appear unremarkable. IMPRESSION: Minimally displaced fracture of the right femoral neck. Electronically Signed  By: Anner Crete M.D.   On: 12/07/2017 21:25   Ct Maxillofacial Wo Contrast  Result Date: 12/07/2017 CLINICAL DATA:  Fall down stairs.  Right facial pain. EXAM: CT HEAD WITHOUT CONTRAST CT MAXILLOFACIAL WITHOUT CONTRAST CT CERVICAL SPINE WITHOUT CONTRAST TECHNIQUE: Multidetector CT imaging of the head, cervical spine, and maxillofacial structures were  performed using the standard protocol without intravenous contrast. Multiplanar CT image reconstructions of the cervical spine and maxillofacial structures were also generated. COMPARISON:  Head CT 12/31/2015 FINDINGS: CT HEAD FINDINGS Brain: There is no mass, hemorrhage or extra-axial collection. The size and configuration of the ventricles and extra-axial CSF spaces are normal. There is no acute or chronic infarction. There is hypoattenuation of the periventricular white matter, most commonly indicating chronic ischemic microangiopathy. Vascular: No abnormal hyperdensity of the major intracranial arteries or dural venous sinuses. No intracranial atherosclerosis. Skull: The visualized skull base, calvarium and extracranial soft tissues are normal. CT MAXILLOFACIAL FINDINGS Osseous: --Complex facial fracture types: No LeFort, zygomaticomaxillary complex or nasoorbitoethmoidal fracture. --Simple fracture types: None. --Mandible: No fracture or dislocation. Orbits: The globes are intact. Normal appearance of the intra- and extraconal fat. Symmetric extraocular muscles and optic nerves. Sinuses: Chronic right maxillary sinusitis. Soft tissues: Right facial hematoma and subcutaneous edema overlying the zygomatic arch. CT CERVICAL SPINE FINDINGS Alignment: No static subluxation. Facets are aligned. Occipital condyles and the lateral masses of C1-C2 are aligned. Skull base and vertebrae: No acute fracture. Soft tissues and spinal canal: No prevertebral fluid or swelling. No visible canal hematoma. Disc levels: Disc space narrowing is greatest at C5-C6. Upper chest: No pneumothorax, pulmonary nodule or pleural effusion. Other: Normal visualized paraspinal cervical soft tissues. IMPRESSION: 1. Advanced sequelae of chronic ischemic microangiopathy without acute intracranial abnormality. Chronic ischemic white matter changes and markedly progressed compared to 12/31/2015. 2. No calvarial or facial fracture. 3. Large right  facial hematoma. 4. No acute fracture or static subluxation of the cervical spine. Electronically Signed   By: Ulyses Jarred M.D.   On: 12/07/2017 21:21    Assessment/Plan: 1 Day Post-Op   Active Problems:   Hip fx ([place])  Patient stable postop.  Patient is going to skilled nursing facility on discharge.  Continue physical therapy.  Patient should receive Lovenox 40 mg daily x4 weeks postop.  She is touchdown weightbearing only on the right lower extremity.  Follow-up in my office in 10 to 14 days for reevaluation and x-ray.    Thornton Park , MD 12/09/2017, 5:56 PM

## 2017-12-09 NOTE — Anesthesia Postprocedure Evaluation (Signed)
Anesthesia Post Note  Patient: Charlene [name]  Procedure(s) Performed: CANNULATED HIP PINNING (Right Hip)  Patient location during evaluation: Nursing Unit Anesthesia Type: Spinal Level of consciousness: awake, awake and alert and oriented Pain management: pain level controlled Vital Signs Assessment: post-procedure vital signs reviewed and stable Respiratory status: spontaneous breathing, nonlabored ventilation and respiratory function stable Cardiovascular status: blood pressure returned to baseline and stable Postop Assessment: no headache and no backache Anesthetic complications: no     Last Vitals:  Vitals:   12/08/17 2308 12/09/17 0440  BP: (!) 136/96 100/62  Pulse: (!) 105 92  Resp: 17 16  Temp:  36.6 C  SpO2: 92% 94%    Last Pain:  Vitals:   12/09/17 0322  TempSrc:   PainSc: 7                  [employer]

## 2017-12-09 NOTE — Progress Notes (Signed)
[facility] NAME: Charlene [name]    MR#:  [id number]  DATE OF BIRTH:  07-Aug-1960  SUBJECTIVE:   Worked with PT this am  REVIEW OF SYSTEMS:    Review of Systems  Constitutional: Negative for fever, chills weight loss HENT: Negative for ear pain, nosebleeds, congestion, facial swelling, rhinorrhea, neck pain, neck stiffness and ear discharge.   Respiratory: Negative for cough, shortness of breath, wheezing  Cardiovascular: Negative for chest pain, palpitations and leg swelling.  Gastrointestinal: Negative for heartburn, abdominal pain, vomiting, diarrhea or consitpation Genitourinary: Negative for dysuria, urgency, frequency, hematuria Musculoskeletal: Negative for back pain + mild joint pain Neurological: Negative for dizziness, seizures, syncope, focal weakness,  numbness and headaches.  Hematological: Does not bruise/bleed easily.  Psychiatric/Behavioral: Negative for hallucinations, confusion, dysphoric mood    Tolerating Diet:  yes     DRUG ALLERGIES:   Allergies  Allergen Reactions  . Amoxicillin-Pot Clavulanate Hives    Also vomiting  . Septra [Sulfamethoxazole-Trimethoprim] Hives and Nausea And Vomiting    VITALS:  Blood pressure 120/74, pulse 92, temperature (!) 97.3 F (36.3 C), temperature source Oral, resp. rate 18, height 5\' 8"  (1.727 m), weight 78.2 kg (172 lb 6.4 oz), SpO2 99 %.  PHYSICAL EXAMINATION:  Constitutional: Appears well-developed and well-nourished. No distress. HENT: Normocephalic. Marland Kitchen Oropharynx is clear and moist.  Eyes: Conjunctivae and EOM are normal. PERRLA, no scleral icterus.  Neck: Normal ROM. Neck supple. No JVD. No tracheal deviation. CVS: RRR, S1/S2 +, no murmurs, no gallops, no carotid bruit.  Pulmonary: Effort and breath sounds normal, no stridor, rhonchi, wheezes, rales.  Abdominal: Soft. BS +,  no distension, tenderness, rebound or guarding.  Musculoskeletal: . No edema and no  tenderness.  Neuro: Alert. CN 2-12 grossly intact. No focal deficits. Skin: Right facial hematoma  psychiatric: Normal mood and affect.      LABORATORY PANEL:   CBC Recent Labs  Lab 12/09/17 0409  WBC 8.2  HGB 11.4*  HCT 33.7*  PLT 165   ------------------------------------------------------------------------------------------------------------------  Chemistries  Recent Labs  Lab 12/09/17 0409  NA 133*  K 4.2  CL 102  CO2 25  GLUCOSE 107*  BUN 7  CREATININE 0.52  CALCIUM 8.1*   ------------------------------------------------------------------------------------------------------------------  Cardiac Enzymes No results for input(s): TROPONINI in the last 168 hours. ------------------------------------------------------------------------------------------------------------------  RADIOLOGY:  Ct Head Wo Contrast  Result Date: 12/07/2017 CLINICAL DATA:  Fall down stairs.  Right facial pain. EXAM: CT HEAD WITHOUT CONTRAST CT MAXILLOFACIAL WITHOUT CONTRAST CT CERVICAL SPINE WITHOUT CONTRAST TECHNIQUE: Multidetector CT imaging of the head, cervical spine, and maxillofacial structures were performed using the standard protocol without intravenous contrast. Multiplanar CT image reconstructions of the cervical spine and maxillofacial structures were also generated. COMPARISON:  Head CT 12/31/2015 FINDINGS: CT HEAD FINDINGS Brain: There is no mass, hemorrhage or extra-axial collection. The size and configuration of the ventricles and extra-axial CSF spaces are normal. There is no acute or chronic infarction. There is hypoattenuation of the periventricular white matter, most commonly indicating chronic ischemic microangiopathy. Vascular: No abnormal hyperdensity of the major intracranial arteries or dural venous sinuses. No intracranial atherosclerosis. Skull: The visualized skull base, calvarium and extracranial soft tissues are normal. CT MAXILLOFACIAL FINDINGS Osseous: --Complex  facial fracture types: No LeFort, zygomaticomaxillary complex or nasoorbitoethmoidal fracture. --Simple fracture types: None. --Mandible: No fracture or dislocation. Orbits: The globes are intact. Normal appearance of the intra- and extraconal fat. Symmetric extraocular muscles and optic nerves. Sinuses: Chronic  right maxillary sinusitis. Soft tissues: Right facial hematoma and subcutaneous edema overlying the zygomatic arch. CT CERVICAL SPINE FINDINGS Alignment: No static subluxation. Facets are aligned. Occipital condyles and the lateral masses of C1-C2 are aligned. Skull base and vertebrae: No acute fracture. Soft tissues and spinal canal: No prevertebral fluid or swelling. No visible canal hematoma. Disc levels: Disc space narrowing is greatest at C5-C6. Upper chest: No pneumothorax, pulmonary nodule or pleural effusion. Other: Normal visualized paraspinal cervical soft tissues. IMPRESSION: 1. Advanced sequelae of chronic ischemic microangiopathy without acute intracranial abnormality. Chronic ischemic white matter changes and markedly progressed compared to 12/31/2015. 2. No calvarial or facial fracture. 3. Large right facial hematoma. 4. No acute fracture or static subluxation of the cervical spine. Electronically Signed   By: Ulyses Jarred M.D.   On: 12/07/2017 21:21   Ct Cervical Spine Wo Contrast  Result Date: 12/07/2017 CLINICAL DATA:  Fall down stairs.  Right facial pain. EXAM: CT HEAD WITHOUT CONTRAST CT MAXILLOFACIAL WITHOUT CONTRAST CT CERVICAL SPINE WITHOUT CONTRAST TECHNIQUE: Multidetector CT imaging of the head, cervical spine, and maxillofacial structures were performed using the standard protocol without intravenous contrast. Multiplanar CT image reconstructions of the cervical spine and maxillofacial structures were also generated. COMPARISON:  Head CT 12/31/2015 FINDINGS: CT HEAD FINDINGS Brain: There is no mass, hemorrhage or extra-axial collection. The size and configuration of the  ventricles and extra-axial CSF spaces are normal. There is no acute or chronic infarction. There is hypoattenuation of the periventricular white matter, most commonly indicating chronic ischemic microangiopathy. Vascular: No abnormal hyperdensity of the major intracranial arteries or dural venous sinuses. No intracranial atherosclerosis. Skull: The visualized skull base, calvarium and extracranial soft tissues are normal. CT MAXILLOFACIAL FINDINGS Osseous: --Complex facial fracture types: No LeFort, zygomaticomaxillary complex or nasoorbitoethmoidal fracture. --Simple fracture types: None. --Mandible: No fracture or dislocation. Orbits: The globes are intact. Normal appearance of the intra- and extraconal fat. Symmetric extraocular muscles and optic nerves. Sinuses: Chronic right maxillary sinusitis. Soft tissues: Right facial hematoma and subcutaneous edema overlying the zygomatic arch. CT CERVICAL SPINE FINDINGS Alignment: No static subluxation. Facets are aligned. Occipital condyles and the lateral masses of C1-C2 are aligned. Skull base and vertebrae: No acute fracture. Soft tissues and spinal canal: No prevertebral fluid or swelling. No visible canal hematoma. Disc levels: Disc space narrowing is greatest at C5-C6. Upper chest: No pneumothorax, pulmonary nodule or pleural effusion. Other: Normal visualized paraspinal cervical soft tissues. IMPRESSION: 1. Advanced sequelae of chronic ischemic microangiopathy without acute intracranial abnormality. Chronic ischemic white matter changes and markedly progressed compared to 12/31/2015. 2. No calvarial or facial fracture. 3. Large right facial hematoma. 4. No acute fracture or static subluxation of the cervical spine. Electronically Signed   By: Ulyses Jarred M.D.   On: 12/07/2017 21:21   Ct Hip Right Wo Contrast  Result Date: 12/07/2017 CLINICAL DATA:  Right hip pain after mechanical fall. EXAM: CT OF THE RIGHT HIP WITHOUT CONTRAST TECHNIQUE: Multidetector CT  imaging of the right hip was performed according to the standard protocol. Multiplanar CT image reconstructions were also generated. 3D reconstructions were also provided of the right hip. COMPARISON:  Same day radiographs FINDINGS: Bones/Joint/Cartilage There is an acute, closed, subcapital right femoral neck fracture with impaction of the femoral neck on femoral head along its superolateral aspect. Slight caudal displacement of the femoral neck relative to the femoral head. No joint dislocation. Pubic rami appear intact. Ligaments Suboptimally assessed by CT. Muscles and Tendons No intramuscular hemorrhage.  No  atrophy. Soft tissues No subcutaneous soft tissue fluid collection or hematoma. Mild soft tissue contusion along the lateral aspect of the right hip. IMPRESSION: Acute, closed, slightly impacted subcapital right femoral neck fracture with minimal caudal displacement of the femoral neck relative to the head. Electronically Signed   By: Ashley Royalty M.D.   On: 12/07/2017 23:10   Ct 3d Recon At Scanner  Result Date: 12/07/2017 CLINICAL DATA:  Right hip pain after mechanical fall. EXAM: CT OF THE RIGHT HIP WITHOUT CONTRAST TECHNIQUE: Multidetector CT imaging of the right hip was performed according to the standard protocol. Multiplanar CT image reconstructions were also generated. 3D reconstructions were also provided of the right hip. COMPARISON:  Same day radiographs FINDINGS: Bones/Joint/Cartilage There is an acute, closed, subcapital right femoral neck fracture with impaction of the femoral neck on femoral head along its superolateral aspect. Slight caudal displacement of the femoral neck relative to the femoral head. No joint dislocation. Pubic rami appear intact. Ligaments Suboptimally assessed by CT. Muscles and Tendons No intramuscular hemorrhage.  No atrophy. Soft tissues No subcutaneous soft tissue fluid collection or hematoma. Mild soft tissue contusion along the lateral aspect of the right hip.  IMPRESSION: Acute, closed, slightly impacted subcapital right femoral neck fracture with minimal caudal displacement of the femoral neck relative to the head. Electronically Signed   By: Ashley Royalty M.D.   On: 12/07/2017 23:10   Dg Chest Portable 1 View  Result Date: 12/07/2017 CLINICAL DATA:  57 year old female with fall and right hip pain. History of lung cancer. EXAM: PORTABLE CHEST 1 VIEW COMPARISON:  Chest CT dated 11/19/2017 FINDINGS: Right pectoral Port-A-Cath with tip in the region of the cavoatrial junction. Patchy areas of density in the right hilar region similar to prior CT most consistent with post radiation changes. No new consolidative changes. There is no pleural effusion or pneumothorax. The cardiac silhouette is within normal limits. No acute osseous pathology. IMPRESSION: No acute cardiopulmonary process. Electronically Signed   By: Anner Crete M.D.   On: 12/07/2017 21:27   Dg Hip Unilat W Or Wo Pelvis 2-3 Views Right  Result Date: 12/07/2017 CLINICAL DATA:  57 year old female with fall and right hip pain. EXAM: DG HIP (WITH OR WITHOUT PELVIS) 2-3V RIGHT COMPARISON:  None. FINDINGS: There is a minimally displaced fracture of the right femoral neck. There is no dislocation. The bones are mildly osteopenic. Mild arthritic changes of the hips. There is degenerative changes of the lower lumbar spine. The soft tissues appear unremarkable. IMPRESSION: Minimally displaced fracture of the right femoral neck. Electronically Signed   By: Anner Crete M.D.   On: 12/07/2017 21:25   Ct Maxillofacial Wo Contrast  Result Date: 12/07/2017 CLINICAL DATA:  Fall down stairs.  Right facial pain. EXAM: CT HEAD WITHOUT CONTRAST CT MAXILLOFACIAL WITHOUT CONTRAST CT CERVICAL SPINE WITHOUT CONTRAST TECHNIQUE: Multidetector CT imaging of the head, cervical spine, and maxillofacial structures were performed using the standard protocol without intravenous contrast. Multiplanar CT image reconstructions of  the cervical spine and maxillofacial structures were also generated. COMPARISON:  Head CT 12/31/2015 FINDINGS: CT HEAD FINDINGS Brain: There is no mass, hemorrhage or extra-axial collection. The size and configuration of the ventricles and extra-axial CSF spaces are normal. There is no acute or chronic infarction. There is hypoattenuation of the periventricular white matter, most commonly indicating chronic ischemic microangiopathy. Vascular: No abnormal hyperdensity of the major intracranial arteries or dural venous sinuses. No intracranial atherosclerosis. Skull: The visualized skull base, calvarium and extracranial soft  tissues are normal. CT MAXILLOFACIAL FINDINGS Osseous: --Complex facial fracture types: No LeFort, zygomaticomaxillary complex or nasoorbitoethmoidal fracture. --Simple fracture types: None. --Mandible: No fracture or dislocation. Orbits: The globes are intact. Normal appearance of the intra- and extraconal fat. Symmetric extraocular muscles and optic nerves. Sinuses: Chronic right maxillary sinusitis. Soft tissues: Right facial hematoma and subcutaneous edema overlying the zygomatic arch. CT CERVICAL SPINE FINDINGS Alignment: No static subluxation. Facets are aligned. Occipital condyles and the lateral masses of C1-C2 are aligned. Skull base and vertebrae: No acute fracture. Soft tissues and spinal canal: No prevertebral fluid or swelling. No visible canal hematoma. Disc levels: Disc space narrowing is greatest at C5-C6. Upper chest: No pneumothorax, pulmonary nodule or pleural effusion. Other: Normal visualized paraspinal cervical soft tissues. IMPRESSION: 1. Advanced sequelae of chronic ischemic microangiopathy without acute intracranial abnormality. Chronic ischemic white matter changes and markedly progressed compared to 12/31/2015. 2. No calvarial or facial fracture. 3. Large right facial hematoma. 4. No acute fracture or static subluxation of the cervical spine. Electronically Signed   By:  Ulyses Jarred M.D.   On: 12/07/2017 21:21     ASSESSMENT AND PLAN:   57 year old female with a history of COPD who presents after mechanical fall.   1. Acute, closed, slightly impacted subcapital right femoral neck fracture with minimal caudal displacement of the femoral neck relative to the head: POD # 1 CANNULATED HIP PINNING, RIGHT HIP DVT prophylaxis and pain control as per orthopedic surgery.  2.  COPD without signs exacerbation: continue Continue PRN albuterol  3.  Facial hematoma from mechanical fall:    4.  Essential hypertension: Continue diltiazem  5. Mild hyponatremia: Follow BMP in am Continue IVF for now  Management plans discussed with the patient and She is in agreement.  CODE STATUS: Full  TOTAL TIME TAKING CARE OF THIS PATIENT: 4 minutes.     POSSIBLE D/C tomorrow, DEPENDING ON CLINICAL CONDITION.   Toyna Erisman M.D on 12/09/2017 at 11:38 AM  Between 7am to 6pm - Pager - [phone] After 6pm go to [email or web address] - password [email or web address] [facility]  Office  [phone]  CC: Primary care physician; Sison, Adele Dan, MD  Note: This dictation was prepared with Dragon dictation along with smaller phrase technology. Any transcriptional errors that result from this process are unintentional.

## 2017-12-09 NOTE — Progress Notes (Signed)
Called by RN to give prn breathing treatment.  Went to pt room and pt states her breathing is fine and does not want a breathing treatment.  PT was in there working with her and also heard the conversation.

## 2017-12-09 NOTE — Progress Notes (Addendum)
PT is recommending SNF. Clinical Social Worker (CSW) met with patient and made her aware of above. Patient is agreeable to SNF and to stay for 30 days, sign over her disability check to the facility and is willing to go outside of [facility]. FL2 complete and faxed out. PASARR has been received, [id number] E expires on 01/08/2018.   CSW presented only bed offer to patient, [facility]. Patient accepted bed offer. [facility] admissions coordinator at [facility] is aware of accepted bed offer.   [employer], LCSW [phone]

## 2017-12-09 NOTE — Progress Notes (Signed)
Physical Therapy Treatment Patient Details Name: Charlene [name] MRN: [id number] DOB: [birth year] Today's Date: 12/09/2017    History of Present Illness pt presents to ED via EMS after a mechanical fall complaining of R hip pain. Imaging revealed R femoral neck fracture that was fixed via pinning by Dr. Mack Guise on 12/08/17. Pt has a past medical history that includes asthma, bipolar disorder, borderline schizophrenia, falls, lung cancer and HTN.    PT Comments    Pt states that she is doing well this afternoon. Pt has tolerated being up in chair since previous session. Pt states that pain is 6/10 at onset of session and remained similar throughout. Pt instructed in LE there-ex with CGA to mod assist which she tolerated well. Pt transferred sit<>stand with min to mod assist and amb 3' using RW with mod assist. Pt demonstrates safety maintaining touchdown weight bearing throughout session. Pt O2 saturation monitored throughout session began at 92 percent on 2L via nasal canula, desaturated into high 80s during transfer and amb however quickly recovered to WNL once in bed. Nursing staff notified that bed alarm not working at conclusion of session. Pt educated to not attempt to get out of bed without assistance and verbalizes understanding. Pt could benefit from continued skilled therapy at this time to improve deficits toward PLOF. PT will continue to work with pt BID while admitted. D/c recommendations continue to be SNF.    Follow Up Recommendations  SNF     Equipment Recommendations  Rolling walker with 5" wheels;3in1 (PT)    Recommendations for Other Services       Precautions / Restrictions Precautions Precautions: Fall Restrictions Weight Bearing Restrictions: Yes RLE Weight Bearing: Touchdown weight bearing    Mobility  Bed Mobility Overal bed mobility: Needs Assistance Bed Mobility: Sit to Supine     Supine to sit: Min assist     General bed mobility comments: Pt min  assist for sit>supine to manage R LE and prevent increase in pain. Pt required increased time and UE support to perform safely.  Transfers Overall transfer level: Needs assistance Equipment used: Rolling walker (2 wheeled) Transfers: Sit to/from Stand Sit to Stand: Min assist;Mod assist         General transfer comment: pt min to mod assist sit<>stand at [facility] however requires only min assist to maintain upright posture once standing. Pt displays safety adhearing to touchdown weight bearing during transfer.  Ambulation/Gait Ambulation/Gait assistance: Mod assist Gait Distance (Feet): 3 Feet Assistive device: Rolling walker (2 wheeled)       General Gait Details: pt amb 3' using RW from chair to bed with mod assistance maintaining weight bearing precautions throughout.   Stairs             Wheelchair Mobility    Modified Rankin (Stroke Patients Only)       Balance                                            Cognition Arousal/Alertness: Awake/alert Behavior During Therapy: WFL for tasks assessed/performed;Flat affect Overall Cognitive Status: Within Functional Limits for tasks assessed                                        Exercises Other Exercises Other Exercises: Pt instructed in supine  and seated ther-ex to R LE requiring CGA to mod assistance to perform safely including ankle pumps, LAQ, quad sets, glut sets, SLR, hip abd, and heel slides x12. Pt fatigued easy during exercise. Required mod assist with SLR and hip abd.    General Comments        Pertinent Vitals/Pain Pain Assessment: 0-10 Pain Score: 6  Pain Location: R hip Pain Descriptors / Indicators: Aching;Operative site guarding Pain Intervention(s): Limited activity within patient's tolerance;Monitored during session;Ice applied    Home Living                      Prior Function            PT Goals (current goals can now be found in the care plan  section) Acute Rehab PT Goals Patient Stated Goal: to go back home PT Goal Formulation: With patient Time For Goal Achievement: 12/23/17 Potential to Achieve Goals: Good Progress towards PT goals: Progressing toward goals    Frequency    BID      PT Plan Current plan remains appropriate    Co-evaluation              AM-PAC PT "6 Clicks" Daily Activity  Outcome Measure  Difficulty turning over in bed (including adjusting bedclothes, sheets and blankets)?: Unable Difficulty moving from lying on back to sitting on the side of the bed? : Unable Difficulty sitting down on and standing up from a chair with arms (e.g., wheelchair, bedside commode, etc,.)?: Unable Help needed moving to and from a bed to chair (including a wheelchair)?: A Lot Help needed walking in hospital room?: A Lot Help needed climbing 3-5 steps with a railing? : Total 6 Click Score: 8    End of Session Equipment Utilized During Treatment: Gait belt Activity Tolerance: Patient tolerated treatment well;Patient limited by fatigue;Patient limited by pain Patient left: in bed;with call bell/phone within reach;with SCD's reapplied(nursing notified bed alarm not working. ) Nurse Communication: Other (comment);Mobility status(bed alarm not working. pt educated to stay in bed.) PT Visit Diagnosis: Unsteadiness on feet (R26.81);Other abnormalities of gait and mobility (R26.89);Repeated falls (R29.6);Muscle weakness (generalized) (M62.81);History of falling (Z91.81);Difficulty in walking, not elsewhere classified (R26.2);Pain Pain - Right/Left: Right Pain - part of body: Hip     Time: 1683-7290 PT Time Calculation (min) (ACUTE ONLY): 26 min  Charges:                       G Codes:       [name] [name], SPT    [name] 12/09/2017, 2:39 PM

## 2017-12-09 NOTE — Evaluation (Signed)
Physical Therapy Evaluation Patient Details Name: Charlene [name] MRN: [id number] DOB: [birth year] Today's Date: 12/09/2017   History of Present Illness  pt presents to ED via EMS after a mechanical fall complaining of R hip pain. Imaging revealed R femoral neck fracture that was fixed via pinning by Dr. Mack Guise on 12/08/17. Pt has a past medical history that includes asthma, bipolar disorder, borderline schizophrenia, falls, lung cancer and HTN.    Clinical Impression  Pt is a pleasant 57 year old female who was admitted for a R femoral neck fracture after mechanical fall that was subsequently fixed surgically by Dr. Mack Guise. Pt performs bed mobility, transfers, and ambulation with mod to max assist and a RW. Pt demonstrates deficits with strength, ROM, and mobility. Pt is touchdown weight bearing on R LE per MD orders, pt verbalizes understanding of precaution and demonstrates safety with continual cuing. Pt states that pain was 7/10 at the onset of therapy and remained similar throughout. Pt was transferred with mod assistance sit>stand at Rw from elevated surface. Pt amb 3' with max assist and RW while maintaining toe touch weight bearing precautions, transferred stand>sit in chair at bedside.  Pt was instructed verbally and through demonstration HEP. Pt tolerated treatment well however fatigues easily, pt states that poor activity tolerance is consistent with baseline. PT would benefit from continued skilled therapy to improve toward PLOF. PT will continue to work with pt BID during admission. At this time PT recommends d/c to SNF.     Follow Up Recommendations SNF    Equipment Recommendations  Rolling walker with 5" wheels;3in1 (PT)    Recommendations for Other Services       Precautions / Restrictions Precautions Precautions: Fall Restrictions Weight Bearing Restrictions: Yes RLE Weight Bearing: Touchdown weight bearing      Mobility  Bed Mobility Overal bed mobility: Needs  Assistance Bed Mobility: Supine to Sit     Supine to sit: Mod assist     General bed mobility comments: Pt mod assist for supine>sit to manage R LE and prevent increase in pain. Pt required increased time and UE support to perform safely.  Transfers Overall transfer level: Needs assistance Equipment used: Rolling walker (2 wheeled) Transfers: Sit to/from Stand Sit to Stand: Mod assist;From elevated surface         General transfer comment: Pt mod assist from elevated surface sit>stand at RW. pt instructed in safety to not break weight bearing precautions during transfer. Pt does safely without dizziness. able to maintain upright posture with min to mod assist.  Ambulation/Gait Ambulation/Gait assistance: Max assist Gait Distance (Feet): 3 Feet Assistive device: Rolling walker (2 wheeled)       General Gait Details: pt amb 3 ft to chair beside bed with max assist using RW. Max assist required to prevent pt from breaking weight bearing precautions. Pt touch down weight bearing hopping on non operative leg using RW.  Stairs            Wheelchair Mobility    Modified Rankin (Stroke Patients Only)       Balance Overall balance assessment: Needs assistance   Sitting balance-Leahy Scale: Good Sitting balance - Comments: pt able to maintain sitting EOB with B feet on floor and no UE support.     Standing balance-Leahy Scale: Fair Standing balance comment: Pt requires mod assist to maintain upright posture with B UE supported on RW  Pertinent Vitals/Pain Pain Assessment: 0-10 Pain Score: 7  Pain Location: R hip Pain Descriptors / Indicators: Aching;Operative site guarding Pain Intervention(s): Limited activity within patient's tolerance;Monitored during session;Premedicated before session;Ice applied    Home Living Family/patient expects to be discharged to:: Private residence Living Arrangements: Parent;Other  relatives Available Help at Discharge: Family(parents "may" be able to help "a little") Type of Home: House Home Access: Level entry     Home Layout: One level Home Equipment: None      Prior Function Level of Independence: Independent         Comments: pt states that she was independent prior to admission however had frequent falls and became fatigued very easily.     Hand Dominance        Extremity/Trunk Assessment   Upper Extremity Assessment Upper Extremity Assessment: Overall WFL for tasks assessed    Lower Extremity Assessment Lower Extremity Assessment: RLE deficits/detail;LLE deficits/detail RLE Deficits / Details: Not fully assessed due to pain. DF 4/5 RLE Sensation: WNL LLE Deficits / Details: Grossly at least 4/5 isolated DF, knee flexion and extension 4/5 LLE Sensation: WNL       Communication   Communication: No difficulties  Cognition Arousal/Alertness: Awake/alert Behavior During Therapy: WFL for tasks assessed/performed Overall Cognitive Status: Within Functional Limits for tasks assessed                                        General Comments      Exercises Other Exercises Other Exercises: Pt instructed in supine and seated ther-ex to R LE requiring CGA to mod assistance to perform safely including ankle pumps, quad sets, glut sets, SLR, hip abd, and heel slides x10. Pt fatigued easy during exercise.   Assessment/Plan    PT Assessment Patient needs continued PT services  PT Problem List Decreased range of motion;Decreased strength;Decreased activity tolerance;Decreased balance;Decreased mobility;Decreased coordination;Decreased cognition;Decreased knowledge of use of DME;Pain;Decreased knowledge of precautions;Decreased safety awareness       PT Treatment Interventions DME instruction;Gait training;Stair training;Functional mobility training;Therapeutic activities;Therapeutic exercise;Balance training;Neuromuscular  re-education;Patient/family education    PT Goals (Current goals can be found in the Care Plan section)  Acute Rehab PT Goals Patient Stated Goal: to go back home PT Goal Formulation: With patient Time For Goal Achievement: 12/23/17 Potential to Achieve Goals: Good    Frequency BID   Barriers to discharge        Co-evaluation               AM-PAC PT "6 Clicks" Daily Activity  Outcome Measure Difficulty turning over in bed (including adjusting bedclothes, sheets and blankets)?: Unable Difficulty moving from lying on back to sitting on the side of the bed? : Unable Difficulty sitting down on and standing up from a chair with arms (e.g., wheelchair, bedside commode, etc,.)?: Unable Help needed moving to and from a bed to chair (including a wheelchair)?: A Lot Help needed walking in hospital room?: A Lot Help needed climbing 3-5 steps with a railing? : Total 6 Click Score: 8    End of Session Equipment Utilized During Treatment: Gait belt Activity Tolerance: Patient tolerated treatment well;Patient limited by fatigue;Patient limited by pain Patient left: in chair;with call bell/phone within reach;with chair alarm set;with SCD's reapplied   PT Visit Diagnosis: Unsteadiness on feet (R26.81);Other abnormalities of gait and mobility (R26.89);Repeated falls (R29.6);Muscle weakness (generalized) (M62.81);History of falling (Z91.81);Difficulty in walking, not  elsewhere classified (R26.2);Pain Pain - Right/Left: Right Pain - part of body: Hip    Time: 0071-2197 PT Time Calculation (min) (ACUTE ONLY): 40 min   Charges:   PT Evaluation $PT Eval Moderate Complexity: 1 Mod PT Treatments $Therapeutic Exercise: 23-37 mins   PT G Codes:        Bellany Elbaum, SPT   Adaysha Dubinsky 12/09/2017, 11:03 AM

## 2017-12-09 NOTE — Evaluation (Addendum)
Occupational Therapy Evaluation Patient Details Name: Charlene [name] MRN: [id number] DOB: 06-Nov-1960 Today's Date: 12/09/2017    History of Present Illness pt presents to ED via EMS after a mechanical fall complaining of R hip pain. Imaging revealed R femoral neck fracture that was fixed via pinning by Dr. Mack Guise on 12/08/17. Pt has a past medical history that includes asthma, bipolar disorder, borderline schizophrenia, falls, lung cancer and HTN.   Clinical Impression   Met pt lying in bed, agreeable to OT this date. 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 Pt on 2L [place] throughout entirety of session. Pt completed UB bathing while sitting in chair position with set up A using bath wipes. Deodorant applied with set up A and teeth/dentures brushed and cleaned with set up A while sitting in chair position in the bed. Pt needing min A to don new gown to fasten behind back. Bed <> BSC t/f completed with mod A and 2WW and VC's to maintain TTWB precautions. RN staff notified of one occurrence of urine. Pt completed toilet hygiene with set up A and wipes, needing VC's to wipe in the correct direction. Increase of pain from 6-8/9 out of 10 with functional t/f, resolved once back in bed. BUE generalized weakness in shoulders noted, shoulder exercises given (see exercise section).     Follow Up Recommendations  SNF    Equipment Recommendations       Recommendations for Other Services       Precautions / Restrictions Precautions Precautions: Fall Restrictions Weight Bearing Restrictions: Yes RLE Weight Bearing: Touchdown weight bearing      Mobility Bed Mobility Overal bed mobility: Needs Assistance Bed Mobility: Supine to Sit     Supine to sit: Min assist     General bed mobility comments: min A needed to guide RLE 2/2 increase of pain  Transfers Overall transfer  level: Needs assistance Equipment used: Rolling walker (2 wheeled) Transfers: Sit to/from Stand Sit to Stand: Mod assist         General transfer comment: mod A needed for sit <> stand and VC's to maintain TTWB, pt reports she is aware of precaution    Balance Overall balance assessment: Needs assistance Sitting-balance support: Feet supported Sitting balance-Leahy Scale: Good     Standing balance support: Bilateral upper extremity supported Standing balance-Leahy Scale: Fair Standing balance comment: mod A and 2WW                           ADL either performed or assessed with clinical judgement   ADL Overall ADL's : Needs assistance/impaired Eating/Feeding: Set up   Grooming: Set up Grooming Details (indicate cue type and reason): pt applied deodorant and brushed teeth/dentures with set up A Upper Body Bathing: Set up;Bed level Upper Body Bathing Details (indicate cue type and reason): Pt compeleted UB bathing with set up A while in chair position in bed Lower Body Bathing: Total assistance Lower Body Bathing Details (indicate cue type and reason): 2/2 R hip pain, limited ROM Upper Body Dressing : Minimal assistance Upper Body Dressing Details (indicate cue type and reason): to fasten gown on back Lower Body Dressing: Total assistance Lower Body Dressing Details (indicate cue type and reason): 2/2 R hip pain, limited ROM Toilet Transfer: Moderate assistance;RW;BSC;Adhering to hip precautions;Cueing for safety Toilet Transfer Details (indicate cue type and  reason): pt completed BSC t/f with mod A using 2WW, VC's needed to maintain TTWB Toileting- Clothing Manipulation and Hygiene: Set up;Sitting/lateral lean Toileting - Clothing Manipulation Details (indicate cue type and reason): needed VC's to wipe appropriate direction             Vision Baseline Vision/History: No visual deficits Patient Visual Report: No change from baseline       Perception      Praxis      Pertinent Vitals/Pain Pain Assessment: 0-10 Pain Score: 6  Pain Location: R hip Pain Descriptors / Indicators: Aching;Operative site guarding Pain Intervention(s): Limited activity within patient's tolerance;Monitored during session;Ice applied;Repositioned     Hand Dominance     Extremity/Trunk Assessment Upper Extremity Assessment Upper Extremity Assessment: RUE deficits/detail;LUE deficits/detail RUE Deficits / Details: grossly 3/5 in shoulders, decreased B grip strength LUE Deficits / Details: grossly 3/5 in shoulders, decreased B grip strength   Lower Extremity Assessment Lower Extremity Assessment: RLE deficits/detail RLE Deficits / Details: TTWB RLE: Unable to fully assess due to pain       Communication Communication Communication: No difficulties   Cognition Arousal/Alertness: Awake/alert Behavior During Therapy: WFL for tasks assessed/performed;Flat affect Overall Cognitive Status: Within Functional Limits for tasks assessed                                     General Comments  noted hematoma on R side of face with scab, bruising noted on R breast, scattered bruises on arms    Exercises Exercises: Other exercises Other Exercises Other Exercises: ceiling punches 3 sets x10, shoulder abduction with elbows bent 3 sets x10   Shoulder Instructions      Home Living Family/patient expects to be discharged to:: Private residence Living Arrangements: Parent Available Help at Discharge: Family(pt states she helps out parents at baseline, not sure assist they can give) Type of Home: House Home Access: Level entry     Home Layout: One level     Bathroom Shower/Tub: Teacher, early years/pre: Standard     Home Equipment: Tub bench          Prior Functioning/Environment Level of Independence: Independent        Comments: pt states she was independent in ADL within the home, not driving needing some assist with IADL         OT Problem List: Decreased strength;Decreased knowledge of use of DME or AE;Decreased range of motion;Decreased knowledge of precautions;Decreased activity tolerance;Pain      OT Treatment/Interventions: Self-care/ADL training;Therapeutic activities;Energy conservation;DME and/or AE instruction;Patient/family education    OT Goals(Current goals can be found in the care plan section) Acute Rehab OT Goals Patient Stated Goal: to leave the hospital soon and 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 OT Goal Formulation: With patient Time For Goal Achievement: 12/23/17 Potential to Achieve Goals: Good  OT Frequency: Min 2X/week   Barriers to D/C:            Co-evaluation              AM-PAC PT "6 Clicks" Daily Activity     Outcome Measure Help from another person eating meals?: A Little Help from another person taking care of personal grooming?: A Little Help from another person toileting, which includes using toliet, bedpan, or urinal?: A Lot Help from another person bathing (including washing, rinsing, drying)?: Total Help from another person to put  on and taking off regular upper body clothing?: A Little Help from another person to put on and taking off regular lower body clothing?: Total 6 Click Score: 13   End of Session Equipment Utilized During Treatment: Gait belt;Rolling walker;Oxygen Nurse Communication: Mobility status;Other (comment)(bed alarm not working)  Activity Tolerance: Patient limited by pain Patient left: in bed;with call bell/phone within reach;with SCD's reapplied  OT Visit Diagnosis: Other abnormalities of gait and mobility (R26.89);Muscle weakness (generalized) (M62.81);History of falling (Z91.81);Pain Pain - Right/Left: Right Pain - part of body: Hip;Leg                Time: 4098-1191 OT Time Calculation (min): 38 min Charges:  OT General Charges $OT Visit: 1 Visit OT Treatments $Self Care/Home Management : 23-37 mins G-Codes:      Zenovia Jarred, MSOT, OTR/L  [place] 12/09/2017, 6:08 PM

## 2017-12-10 ENCOUNTER — Ambulatory Visit: Payer: Medicaid Other | Admitting: Radiation Oncology

## 2017-12-10 LAB — CBC
HCT: 37.9 % (ref 35.0–47.0)
Hemoglobin: 12.7 g/dL (ref 12.0–16.0)
MCH: 32.8 pg (ref 26.0–34.0)
MCHC: 33.5 g/dL (ref 32.0–36.0)
MCV: 97.8 fL (ref 80.0–100.0)
Platelets: 165 10*3/uL (ref 150–440)
RBC: 3.87 MIL/uL (ref 3.80–5.20)
RDW: 15.1 % — ABNORMAL HIGH (ref 11.5–14.5)
WBC: 8.4 10*3/uL (ref 3.6–11.0)

## 2017-12-10 LAB — BASIC METABOLIC PANEL
ANION GAP: 9 (ref 5–15)
BUN: 9 mg/dL (ref 6–20)
CO2: 25 mmol/L (ref 22–32)
Calcium: 8.6 mg/dL — ABNORMAL LOW (ref 8.9–10.3)
Chloride: 101 mmol/L (ref 98–111)
Creatinine, Ser: 0.53 mg/dL (ref 0.44–1.00)
GFR calc Af Amer: >60 mL/min (ref >60)
Glucose, Bld: 81 mg/dL (ref 70–99)
Potassium: 4.4 mmol/L (ref 3.5–5.1)
SODIUM: 135 mmol/L (ref 135–145)

## 2017-12-10 LAB — GLUCOSE, CAPILLARY: GLUCOSE-CAPILLARY: 78 mg/dL (ref 70–99)

## 2017-12-10 MED ORDER — ENOXAPARIN SODIUM 40 MG/0.4ML ~~LOC~~ SOLN: 40.0000 mg | SUBCUTANEOUS | 0 refills | Status: DC

## 2017-12-10 MED ORDER — HYDROCODONE-ACETAMINOPHEN 5-325 MG PO TABS: 1.0000 | ORAL_TABLET | ORAL | 0 refills | Status: DC | PRN

## 2017-12-10 NOTE — Clinical Social Work Placement (Signed)
   CLINICAL SOCIAL WORK PLACEMENT  NOTE  Date:  12/10/2017  Patient Details  Name: Charlene [name] MRN: [id number] Date of Birth: [birth year]  Clinical Social Work is seeking post-discharge placement for this patient at the [place] level of care (*CSW will initial, date and re-position this form in  chart as items are completed):  Yes   Patient/family provided with [facility] Work Department's list of facilities offering this level of care within the geographic area requested by the patient (or if unable, by the patient's family).  Yes   Patient/family informed of their freedom to choose among providers that offer the needed level of care, that participate in Medicare, Medicaid or managed care program needed by the patient, have an available bed and are willing to accept the patient.  Yes   Patient/family informed of [facility]'s ownership interest in [facility] and [facility], as well as of the fact that they are under no obligation to receive care at these facilities.  PASRR submitted to EDS on 12/08/17     PASRR number received on 12/09/17     Existing PASRR number confirmed on       FL2 transmitted to all facilities in geographic area requested by pt/family on 12/08/17     FL2 transmitted to all facilities within larger geographic area on       Patient informed that his/her managed care company has contracts with or will negotiate with certain facilities, including the following:        Yes   Patient/family informed of bed offers received.  Patient chooses bed at ([facility] )     Physician recommends and patient chooses bed at      Patient to be transferred to ([facility] ) on 12/10/17.  Patient to be transferred to facility by [facility] )     Patient family notified on 12/10/17 of transfer.  Name of family member notified:  (CSW tried to call patient's father [name] however he did  not answer and a voicemail could not be left. )     PHYSICIAN       Additional Comment:    _______________________________________________ Steele Ledonne, Veronia Beets, LCSW 12/10/2017, 11:37 AM

## 2017-12-10 NOTE — Progress Notes (Signed)
Physical Therapy Treatment Patient Details Name: Charlene [name] MRN: [id number] DOB: 31-Mar-1961 Today's Date: 12/10/2017    History of Present Illness pt presents to ED via EMS after a mechanical fall complaining of R hip pain. Imaging revealed R femoral neck fracture that was fixed via pinning by Dr. Mack Guise on 12/08/17. Pt has a past medical history that includes asthma, bipolar disorder, borderline schizophrenia, falls, lung cancer and HTN.    PT Comments    Pt states this morning that she is doing well despite 7/10 pain. Pt continues to be limited by pain and fatigue. O2 saturation monitored throughout, at beginning was 92 percent on 2L via [place] which remained on throughout session. Pt desaturated to the mid 80s with activity however recovered with rest and cuing for breathing. Pt mod assist for transfers and amb of 3'. Pt able to verbalize touchdown weight bearing precautions demonstrating understanding. Touchdown weight bearing maintained throughout session. Pt instructed in there-ex requiring less physical assistance to perform than previous sessions. Pt could benefit from continued skilled therapy at this time to improve deficits toward PLOF. PT will continue to work with pt BID while admitted. D/c recommendations continue to be SNF.   Follow Up Recommendations  SNF     Equipment Recommendations  Rolling walker with 5" wheels;3in1 (PT)    Recommendations for Other Services       Precautions / Restrictions Precautions Precautions: Fall Restrictions Weight Bearing Restrictions: Yes RLE Weight Bearing: Touchdown weight bearing    Mobility  Bed Mobility Overal bed mobility: Needs Assistance Bed Mobility: Supine to Sit     Supine to sit: Min assist     General bed mobility comments: min assist for R LE to prevent increase in pain  Transfers Overall transfer level: Needs assistance Equipment used: Rolling walker (2 wheeled) Transfers: Sit to/from Stand Sit to Stand:  Mod assist         General transfer comment: mod assist sit<>stand with verbal and tactile cuing as well as demonstration for safe technique to maintain touchdown weight bearing precautions  Ambulation/Gait Ambulation/Gait assistance: Mod assist Gait Distance (Feet): 3 Feet Assistive device: Rolling walker (2 wheeled)       General Gait Details: pt amb 3' using RW from chair to bed with mod assistance maintaining weight bearing precautions throughout. Unable to amb further at this time secondary to pain and fatigue   Stairs             Wheelchair Mobility    Modified Rankin (Stroke Patients Only)       Balance                                            Cognition Arousal/Alertness: Awake/alert Behavior During Therapy: WFL for tasks assessed/performed;Flat affect Overall Cognitive Status: Within Functional Limits for tasks assessed                                        Exercises Other Exercises Other Exercises: Pt instructed in supine and seated ther-ex x12 with verbal cuing and tactile cuing for safety and technique including ankle pumps, quad sets, glut sets, hip abd, isometric hip adduction, SLR and heel slides. SLR and heel slides required min assist.    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 : Unable Difficulty sitting down on and standing up from a chair with arms (e.g., wheelchair, bedside commode, etc,.)?: Unable Help needed moving to and from a bed to chair (including a wheelchair)?: A Lot Help needed walking in hospital room?: A Lot Help needed climbing 3-5 steps with a railing? : Total 6 Click Score: 8    End of Session Equipment Utilized During Treatment: Gait belt Activity Tolerance: Patient tolerated treatment well;Patient limited by fatigue;Patient limited by pain Patient left: in chair;with call bell/phone within reach;with chair alarm set;with SCD's reapplied Nurse Communication: Mobility status;Other (comment)(need for continuous O2 monitoring) PT Visit Diagnosis: Unsteadiness on feet (R26.81);Other abnormalities of gait and mobility (R26.89);Repeated falls (R29.6);Muscle weakness (generalized) (M62.81);History of falling (Z91.81);Difficulty in walking, not elsewhere classified (R26.2);Pain Pain - Right/Left: Right Pain - part of body: Hip     Time: 3557-3220 PT Time Calculation (min) (ACUTE ONLY): 28 min  Charges:                       G Codes:       [name] [name], SPT    [name] 12/10/2017, 10:25 AM

## 2017-12-10 NOTE — Progress Notes (Addendum)
Pt oxygen desats to 81% on room air at rest. Pt oxygen saturation on nasal cannula at 2 L is 93-94%.

## 2017-12-10 NOTE — Discharge Summary (Signed)
[facility] [place] NAME: Charlene [name]    MR#:  [id number]  DATE OF BIRTH:  [birth year]  DATE OF ADMISSION:  12/07/2017 ADMITTING PHYSICIAN: Amelia Jo, MD  DATE OF DISCHARGE: 12/10/2017  PRIMARY CARE PHYSICIAN: Sison, Adele Dan, MD    ADMISSION DIAGNOSIS:  Hip fracture ([place]) [S72.009A] Closed fracture of neck of right femur, initial encounter ([place]) [S72.001A] Contusion of face, initial encounter [S00.83XA] Fall, initial encounter [W19.XXXA]  DISCHARGE DIAGNOSIS:  Active Problems:   Hip fx ([place])   SECONDARY DIAGNOSIS:   Past Medical History:  Diagnosis Date  . Asthma   . Bipolar 2 disorder ([place])   . Borderline schizophrenia ([place])   . COPD (chronic obstructive pulmonary disease) ([place])   . Falls infrequently   . Hypertension   . 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  Diagnosis at [facility] by fine-needle aspiration of lymph node (January, 2017)  . Ulcer     HOSPITAL COURSE:   57 year old female with a history of COPD who presents after mechanical fall.   1. Acute, closed, slightly impacted subcapital right femoral neck fracture with minimal caudal displacement of the femoral neck relative to the head: POD # 2 CANNULATED HIP PINNING, RIGHT HIP DVT prophylaxis with Lovenox for a total of 14 days and pain control PRN..  2.  COPD without signs exacerbation: Continue PRN albuterol She will require O2 at discahrge. She is amouth breather and this may contribute to her O2 requirement. She has no wheezing.  3.  Facial hematoma from mechanical fall:ICE     4.  Essential hypertension: Continue diltiazem  5. Mild hyponatremia:Resolved   DISCHARGE CONDITIONS AND DIET:   Stable Regular diet  CONSULTS OBTAINED:  Treatment Team:  Thornton Park, MD  DRUG ALLERGIES:   Allergies  Allergen Reactions  . Amoxicillin-Pot Clavulanate Hives     Also vomiting  . Septra [Sulfamethoxazole-Trimethoprim] Hives and Nausea And Vomiting    DISCHARGE MEDICATIONS:   Allergies as of 12/10/2017      Reactions   Amoxicillin-pot Clavulanate Hives   Also vomiting   Septra [sulfamethoxazole-trimethoprim] Hives, Nausea And Vomiting      Medication List    TAKE these medications   acetaminophen 500 MG tablet Commonly known as:  TYLENOL Take 500 mg by mouth every 6 (six) hours as needed.   albuterol 108 (90 Base) MCG/ACT inhaler Commonly known as:  PROVENTIL HFA;VENTOLIN HFA Inhale 2 puffs into the lungs every 4 (four) hours as needed for wheezing or shortness of breath.   cyclobenzaprine 10 MG tablet Commonly known as:  FLEXERIL Take 10 mg by mouth 2 (two) times daily.   diltiazem 120 MG 24 hr capsule Commonly known as:  CARDIZEM CD Take 1 capsule (120 mg total) by mouth daily.   divalproex 500 MG DR tablet Commonly known as:  DEPAKOTE Take 2 tablets (1,000 mg total) by mouth every 12 (twelve) hours.   enoxaparin 40 MG/0.4ML injection Commonly known as:  LOVENOX Inject 0.4 mLs (40 mg total) into the skin daily for 13 days. Start taking on:  12/11/2017   fluticasone 50 MCG/ACT nasal spray Commonly known as:  FLONASE 1 spray by Each Nare route daily.   Fluticasone-Salmeterol 500-50 MCG/DOSE Aepb Commonly known as:  ADVAIR DISKUS Inhale 1 puff into the lungs 2 (two) times daily.   HYDROcodone-acetaminophen 5-325 MG tablet Commonly known as:  NORCO/VICODIN Take 1-2 tablets by  mouth every 4 (four) hours as needed for moderate pain (pain score 4-6).   ipratropium-albuterol 0.5-2.5 (3) MG/3ML Soln Commonly known as:  DUONEB Take 3 mLs by nebulization every 4 (four) hours as needed.   loratadine 10 MG tablet Commonly known as:  CLARITIN Take 10 mg by mouth daily as needed.   montelukast 10 MG tablet Commonly known as:  SINGULAIR Take 1 tablet (10 mg total) by mouth at bedtime.   multivitamin tablet Take 1 tablet by  mouth daily.   omeprazole 20 MG capsule Commonly known as:  PRILOSEC Take 20 mg by mouth daily.   risperiDONE 1 MG tablet Commonly known as:  RISPERDAL Take 1 tablet (1 mg total) by mouth at bedtime.   salmeterol 50 MCG/DOSE diskus inhaler Commonly known as:  SEREVENT Inhale 1 puff into the lungs 2 (two) times daily.   SPIRIVA HANDIHALER 18 MCG inhalation capsule Generic drug:  tiotropium Place 18 mcg into inhaler and inhale daily.   sucralfate 1 g tablet Commonly known as:  CARAFATE Take 1 tablet (1 g total) by mouth 4 (four) times daily. Dissolve in 2-3 tbsp warm water, swish and swallow.         Today   CHIEF COMPLAINT:  Doing well no SOB this am no chest pain   VITAL SIGNS:  Blood pressure 132/80, pulse 93, temperature 98.3 F (36.8 C), temperature source Oral, resp. rate 16, height 5\' 8"  (1.727 m), weight 78.2 kg (172 lb 6.4 oz), SpO2 94 %.   REVIEW OF SYSTEMS:  Review of Systems  Constitutional: Negative.  Negative for chills, fever and malaise/fatigue.  HENT: Negative.  Negative for ear discharge, ear pain, hearing loss, nosebleeds and sore throat.   Eyes: Negative.  Negative for blurred vision and pain.  Respiratory: Negative.  Negative for cough, hemoptysis, shortness of breath and wheezing.   Cardiovascular: Negative.  Negative for chest pain, palpitations and leg swelling.  Gastrointestinal: Negative.  Negative for abdominal pain, blood in stool, diarrhea, nausea and vomiting.  Genitourinary: Negative.  Negative for dysuria.  Musculoskeletal: Negative.  Negative for back pain.  Skin: Negative.   Neurological: Negative for dizziness, tremors, speech change, focal weakness, seizures and headaches.  Endo/Heme/Allergies: Negative.  Does not bruise/bleed easily.  Psychiatric/Behavioral: Negative.  Negative for depression, hallucinations and suicidal ideas.     PHYSICAL EXAMINATION:  GENERAL:  57 y.o.-year-old patient lying in the bed with no acute  distress.  NECK:  Supple, no jugular venous distention. No thyroid enlargement, no tenderness.  LUNGS: Normal breath sounds bilaterally, no wheezing, rales,rhonchi  No use of accessory muscles of respiration.  CARDIOVASCULAR: S1, S2 normal. No murmurs, rubs, or gallops.  ABDOMEN: Soft, non-tender, non-distended. Bowel sounds present. No organomegaly or mass.  EXTREMITIES: No pedal edema, cyanosis, or clubbing.  PSYCHIATRIC: The patient is alert and oriented x 3.  SKIN: No obvious rash, lesion, or ulcer.   DATA REVIEW:   CBC Recent Labs  Lab 12/10/17 0403  WBC 8.4  HGB 12.7  HCT 37.9  PLT 165    Chemistries  Recent Labs  Lab 12/10/17 0403  NA 135  K 4.4  CL 101  CO2 25  GLUCOSE 81  BUN 9  CREATININE 0.53  CALCIUM 8.6*    Cardiac Enzymes No results for input(s): TROPONINI in the last 168 hours.  Microbiology Results  @MICRORSLT48 @  RADIOLOGY:  Dg Hip Port Unilat With Pelvis 1v Right  Result Date: 12/08/2017 CLINICAL DATA:  Hip fracture EXAM: DG HIP (WITH OR  WITHOUT PELVIS) 1V PORT RIGHT COMPARISON:  12/07/2017 FINDINGS: Interval ORIF right subcapital femoral neck fracture. Normal alignment. Three screws across the fracture in good position. IMPRESSION: Satisfactory fixation of right femoral neck fracture. Electronically Signed   By: Franchot Gallo M.D.   On: 12/08/2017 16:42   Dg Hip Operative Unilat W Or W/o Pelvis Right  Result Date: 12/08/2017 CLINICAL DATA:  57 year old female with right femoral neck/subcapital fracture. Subsequent encounter. EXAM: OPERATIVE right HIP (WITH PELVIS IF PERFORMED) four VIEWS TECHNIQUE: Fluoroscopic spot image(s) were submitted for interpretation post-operatively. Fluoroscopic time: 1 minutes and 28 seconds. COMPARISON:  12/07/2017 CT and plain film exam. FINDINGS: Three screws transfix right subcapital/femoral neck fracture. No complication noted. IMPRESSION: Three screws transfix right subcapital/femoral neck fracture. No complication  noted. Electronically Signed   By: Genia Del M.D.   On: 12/08/2017 15:47      Allergies as of 12/10/2017      Reactions   Amoxicillin-pot Clavulanate Hives   Also vomiting   Septra [sulfamethoxazole-trimethoprim] Hives, Nausea And Vomiting      Medication List    TAKE these medications   acetaminophen 500 MG tablet Commonly known as:  TYLENOL Take 500 mg by mouth every 6 (six) hours as needed.   albuterol 108 (90 Base) MCG/ACT inhaler Commonly known as:  PROVENTIL HFA;VENTOLIN HFA Inhale 2 puffs into the lungs every 4 (four) hours as needed for wheezing or shortness of breath.   cyclobenzaprine 10 MG tablet Commonly known as:  FLEXERIL Take 10 mg by mouth 2 (two) times daily.   diltiazem 120 MG 24 hr capsule Commonly known as:  CARDIZEM CD Take 1 capsule (120 mg total) by mouth daily.   divalproex 500 MG DR tablet Commonly known as:  DEPAKOTE Take 2 tablets (1,000 mg total) by mouth every 12 (twelve) hours.   enoxaparin 40 MG/0.4ML injection Commonly known as:  LOVENOX Inject 0.4 mLs (40 mg total) into the skin daily for 13 days. Start taking on:  12/11/2017   fluticasone 50 MCG/ACT nasal spray Commonly known as:  FLONASE 1 spray by Each Nare route daily.   Fluticasone-Salmeterol 500-50 MCG/DOSE Aepb Commonly known as:  ADVAIR DISKUS Inhale 1 puff into the lungs 2 (two) times daily.   HYDROcodone-acetaminophen 5-325 MG tablet Commonly known as:  NORCO/VICODIN Take 1-2 tablets by mouth every 4 (four) hours as needed for moderate pain (pain score 4-6).   ipratropium-albuterol 0.5-2.5 (3) MG/3ML Soln Commonly known as:  DUONEB Take 3 mLs by nebulization every 4 (four) hours as needed.   loratadine 10 MG tablet Commonly known as:  CLARITIN Take 10 mg by mouth daily as needed.   montelukast 10 MG tablet Commonly known as:  SINGULAIR Take 1 tablet (10 mg total) by mouth at bedtime.   multivitamin tablet Take 1 tablet by mouth daily.   omeprazole 20 MG  capsule Commonly known as:  PRILOSEC Take 20 mg by mouth daily.   risperiDONE 1 MG tablet Commonly known as:  RISPERDAL Take 1 tablet (1 mg total) by mouth at bedtime.   salmeterol 50 MCG/DOSE diskus inhaler Commonly known as:  SEREVENT Inhale 1 puff into the lungs 2 (two) times daily.   SPIRIVA HANDIHALER 18 MCG inhalation capsule Generic drug:  tiotropium Place 18 mcg into inhaler and inhale daily.   sucralfate 1 g tablet Commonly known as:  CARAFATE Take 1 tablet (1 g total) by mouth 4 (four) times daily. Dissolve in 2-3 tbsp warm water, swish and swallow.  Management plans discussed with the patient and she is in agreement. Stable for discharge   Patient should follow up with ortho  CODE STATUS:     Code Status Orders  (From admission, onward)        Start     Ordered   12/08/17 0115  Full code  Continuous     12/08/17 0114    Code Status History    Date Active Date Inactive Code Status Order ID Comments User Context   11/19/2016 1054 11/24/2016 1649 DNR [id number]  [name], MD Inpatient   11/16/2016 1612 11/19/2016 1054 Full Code [id number]  Gladstone Lighter, MD Inpatient   12/31/2015 0020 01/11/2016 1411 Full Code [id number]  Lance Coon, MD Inpatient      TOTAL TIME TAKING CARE OF THIS PATIENT: 38 minutes.    Note: This dictation was prepared with Dragon dictation along with smaller phrase technology. Any transcriptional errors that result from this process are unintentional.  Ryne Mctigue M.D on 12/10/2017 at 10:36 AM  Between 7am to 6pm - Pager - [phone] After 6pm go to [email or web address] - password [email or web address] [place] [facility]  Office  [phone]  CC: Primary care physician; Sison, Adele Dan, MD

## 2017-12-10 NOTE — Progress Notes (Signed)
Patient is medically stable for D/C to [facility] today. Per [facility] admissions coordinator at [facility] patient can come today. RN will call report and arrange EMS for transport. Clinical Education officer, museum (CSW) sent D/C orders to [facility] via [place]. Patient is aware of above. CSW tried to call patient's father [name] however he did not answer and a voicemail could not be left. Please reconsult if future social work needs arise. CSW signing off.   [employer], LCSW [phone]

## 2017-12-10 NOTE — Progress Notes (Signed)
Patient's father Charlene [name] called [place] representative (CSW) to see if [name] had any medicaid beds. Per [facility] admissions coordinator at [facility] they do not have any medicaid beds. Charlene [name] is aware of above and agreeable for patient to D/C to [facility].   [employer], LCSW [phone]

## 2017-12-10 NOTE — Progress Notes (Signed)
Pt had a small bm. In no acute distress. VSS. IV removed. Report called to [facility] [place]. EMS called to transport the pt.

## 2017-12-10 NOTE — Progress Notes (Signed)
Subjective:  Postop day #1 status post percutaneous fixation of right femoral neck hip fracture.  Patient reports right hip pain as mild to moderate.    Objective:   VITALS:   Vitals:   12/09/17 1529 12/09/17 1540 12/10/17 0034 12/10/17 0831  BP:  126/84 125/89 132/80  Pulse:  92 94 93  Resp:  18 18 16   Temp:  97.7 F (36.5 C) 98.5 F (36.9 C) 98.3 F (36.8 C)  TempSrc:  Oral Oral Oral  SpO2: 95% 91% 100% 94%  Weight:      Height:        PHYSICAL EXAM: Right lower extremity Neurovascular intact Sensation intact distally Intact pulses distally Dorsiflexion/Plantar flexion intact Incision: scant drainage No cellulitis present Compartment soft  LABS  Results for orders placed or performed during the hospital encounter of 12/07/17 (from the past 24 hour(s))  CBC     Status: Abnormal   Collection Time: 12/10/17  4:03 AM  Result Value Ref Range   WBC 8.4 3.6 - 11.0 K/uL   RBC 3.87 3.80 - 5.20 MIL/uL   Hemoglobin 12.7 12.0 - 16.0 g/dL   HCT 37.9 35.0 - 47.0 %   MCV 97.8 80.0 - 100.0 fL   MCH 32.8 26.0 - 34.0 pg   MCHC 33.5 32.0 - 36.0 g/dL   RDW 15.1 (H) 11.5 - 14.5 %   Platelets 165 150 - 440 K/uL  Basic metabolic panel     Status: Abnormal   Collection Time: 12/10/17  4:03 AM  Result Value Ref Range   Sodium 135 135 - 145 mmol/L   Potassium 4.4 3.5 - 5.1 mmol/L   Chloride 101 98 - 111 mmol/L   CO2 25 22 - 32 mmol/L   Glucose, Bld 81 70 - 99 mg/dL   BUN 9 6 - 20 mg/dL   Creatinine, Ser 0.53 0.44 - 1.00 mg/dL   Calcium 8.6 (L) 8.9 - 10.3 mg/dL   GFR calc non Af Amer >60 >60 mL/min   GFR calc Af Amer >60 >60 mL/min   Anion gap 9 5 - 15  Glucose, capillary     Status: None   Collection Time: 12/10/17  8:08 AM  Result Value Ref Range   Glucose-Capillary 78 70 - 99 mg/dL    Dg Hip Port Unilat With Pelvis 1v Right  Result Date: 12/08/2017 CLINICAL DATA:  Hip fracture EXAM: DG HIP (WITH OR WITHOUT PELVIS) 1V PORT RIGHT COMPARISON:  12/07/2017 FINDINGS:  Interval ORIF right subcapital femoral neck fracture. Normal alignment. Three screws across the fracture in good position. IMPRESSION: Satisfactory fixation of right femoral neck fracture. Electronically Signed   By: Franchot Gallo M.D.   On: 12/08/2017 16:42   Dg Hip Operative Unilat W Or W/o Pelvis Right  Result Date: 12/08/2017 CLINICAL DATA:  57 year old female with right femoral neck/subcapital fracture. Subsequent encounter. EXAM: OPERATIVE right HIP (WITH PELVIS IF PERFORMED) four VIEWS TECHNIQUE: Fluoroscopic spot image(s) were submitted for interpretation post-operatively. Fluoroscopic time: 1 minutes and 28 seconds. COMPARISON:  12/07/2017 CT and plain film exam. FINDINGS: Three screws transfix right subcapital/femoral neck fracture. No complication noted. IMPRESSION: Three screws transfix right subcapital/femoral neck fracture. No complication noted. Electronically Signed   By: Genia Del M.D.   On: 12/08/2017 15:47    Assessment/Plan: 2 Days Post-Op   Active Problems:   Hip fx ([place])  Patient stable postop.   I spoke with ENT, Dr. Tami Ribas, who recommended patient apply ice to the hematoma of her face.  He does not foresee any surgical intervention necessary.  Patient will remain nonweightbearing on the right lower extremity with use of her walker x6 weeks postop.  Recommend Lovenox 40 mg daily x4 weeks.  Patient will follow-up with me in 14 days.    Thornton Park , MD 12/10/2017, 12:43 PM

## 2018-03-08 ENCOUNTER — Ambulatory Visit
Admission: RE | Admit: 2018-03-08 | Discharge: 2018-03-08 | Disposition: A | Discharge status: Home / Self Care | Payer: Medicare Other | Source: Ambulatory Visit | Attending: Internal Medicine | Admitting: Internal Medicine

## 2018-03-08 ENCOUNTER — Telehealth: Payer: Self-pay | Admitting: Internal Medicine

## 2018-03-08 ENCOUNTER — Encounter: Payer: Self-pay | Admitting: Internal Medicine

## 2018-03-08 ENCOUNTER — Other Ambulatory Visit: Payer: Self-pay

## 2018-03-08 ENCOUNTER — Inpatient Hospital Stay: Payer: Medicare Other | Attending: Internal Medicine

## 2018-03-08 ENCOUNTER — Inpatient Hospital Stay (HOSPITAL_BASED_OUTPATIENT_CLINIC_OR_DEPARTMENT_OTHER): Payer: Medicare Other | Admitting: Internal Medicine

## 2018-03-08 VITALS — BP 116/81 | HR 108 | Temp 97.9°F | Ht 68.0 in | Wt 190.0 lb

## 2018-03-08 DIAGNOSIS — C3431 Malignant neoplasm of lower lobe, right bronchus or lung: Secondary | ICD-10-CM | POA: Diagnosis present

## 2018-03-08 DIAGNOSIS — R05 Cough: Secondary | ICD-10-CM | POA: Insufficient documentation

## 2018-03-08 DIAGNOSIS — R059 Cough, unspecified: Secondary | ICD-10-CM

## 2018-03-08 DIAGNOSIS — R5383 Other fatigue: Secondary | ICD-10-CM

## 2018-03-08 DIAGNOSIS — R0609 Other forms of dyspnea: Secondary | ICD-10-CM

## 2018-03-08 DIAGNOSIS — R062 Wheezing: Secondary | ICD-10-CM | POA: Insufficient documentation

## 2018-03-08 DIAGNOSIS — Z9181 History of falling: Secondary | ICD-10-CM

## 2018-03-08 DIAGNOSIS — Z87891 Personal history of nicotine dependence: Secondary | ICD-10-CM | POA: Insufficient documentation

## 2018-03-08 DIAGNOSIS — R42 Dizziness and giddiness: Secondary | ICD-10-CM

## 2018-03-08 DIAGNOSIS — J449 Chronic obstructive pulmonary disease, unspecified: Secondary | ICD-10-CM | POA: Insufficient documentation

## 2018-03-08 LAB — CBC WITH DIFFERENTIAL/PLATELET
BASOS PCT: 1 %
Basophils Absolute: 0 10*3/uL (ref 0–0.1)
EOS ABS: 0.2 10*3/uL (ref 0–0.7)
Eosinophils Relative: 3 %
HCT: 37.9 % (ref 35.0–47.0)
HEMOGLOBIN: 12.8 g/dL (ref 12.0–16.0)
Lymphocytes Relative: 23 %
Lymphs Abs: 1.8 10*3/uL (ref 1.0–3.6)
MCH: 31.9 pg (ref 26.0–34.0)
MCHC: 33.7 g/dL (ref 32.0–36.0)
MCV: 94.7 fL (ref 80.0–100.0)
MONOS PCT: 8 %
Monocytes Absolute: 0.6 10*3/uL (ref 0.2–0.9)
NEUTROS PCT: 65 %
Neutro Abs: 5.1 10*3/uL (ref 1.4–6.5)
Platelets: 233 10*3/uL (ref 150–440)
RBC: 4.01 MIL/uL (ref 3.80–5.20)
RDW: 15.3 % — AB (ref 11.5–14.5)
WBC: 7.7 10*3/uL (ref 3.6–11.0)

## 2018-03-08 LAB — BASIC METABOLIC PANEL
ANION GAP: 10 (ref 5–15)
BUN: 12 mg/dL (ref 6–20)
CALCIUM: 9 mg/dL (ref 8.9–10.3)
CHLORIDE: 106 mmol/L (ref 98–111)
CO2: 22 mmol/L (ref 22–32)
CREATININE: 0.71 mg/dL (ref 0.44–1.00)
GFR calc non Af Amer: >60 mL/min (ref >60)
Glucose, Bld: 121 mg/dL — ABNORMAL HIGH (ref 70–99)
Potassium: 3.8 mmol/L (ref 3.5–5.1)
SODIUM: 138 mmol/L (ref 135–145)

## 2018-03-08 NOTE — Telephone Encounter (Signed)
Please inform pt that cxr does not show any pneumonia or any evidence of cancer getting worse; recommend using nebulizer at least three or four times a day. Follow up as planned.

## 2018-03-08 NOTE — Telephone Encounter (Signed)
Contacted patient with cxr results. Patient gave verbal understanding of the plan of care. She will use the nebulizer tx as directed.

## 2018-03-08 NOTE — Progress Notes (Signed)
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  Needle aspiration of lymph node was positive for small cell carcinoma of lung. Clinically  Staged  As  T1 N2 M0 tumor.    2.PET scan shows localized disease. MRI of brain is negative for any metastases  3.  Chemotherapy with cis-platinum and VP-16 has been started on July 18, 2015 4.starting radiation therapy to the chest from August 23 2015 .  5.Last chemotherapy (fourth cycle) October 03, 2015 patient has finished radiation therapy.  # AUG 2017- CT C/A/P- NED- stable RML- scarring.   ---------------------------------------------------------------    DIAGNOSIS: [ ]  SMALL CELL LUNG CA  STAGE: LIMITED  ;GOALS: CURATIVE  CURRENT/MOST RECENT THERAPY [ CIS-RT; feb-March 2017] SURVEILLANCE       Primary cancer of right lower lobe of lung ([place])   07/12/2015 Initial Diagnosis    Primary cancer of right lower lobe of lung ([place])       INTERVAL HISTORY:  Charlene [name] 57 y.o.  female pleasant patient above history of small cell lung cancer limited stage is here for follow-up.  Patient had a recent fall.  Fractured right hip.  Traumatic fall.  Patient was in rehab but currently home.  Patient admits to chronic shortness of breath chronic cough.  Chronic wheezing.  Denies any worsening headaches.  Chronic back pain.  Review of Systems  Constitutional: Positive for malaise/fatigue. Negative for chills, diaphoresis, fever and weight loss.  HENT: Positive for congestion. Negative for nosebleeds and sore throat.   Eyes: Negative for double vision.   Respiratory: Positive for cough and shortness of breath. Negative for hemoptysis, sputum production and wheezing.   Cardiovascular: Negative for chest pain, palpitations, orthopnea and leg swelling.  Gastrointestinal: Negative for abdominal pain, blood in stool, constipation, diarrhea, heartburn, melena, nausea and vomiting.  Genitourinary: Negative for dysuria, frequency and urgency.  Musculoskeletal: Positive for back pain and joint pain.  Skin: Negative.  Negative for itching and rash.  Neurological: Positive for dizziness, weakness and headaches. Negative for tingling and focal weakness.  Endo/Heme/Allergies: Does not bruise/bleed easily.  Psychiatric/Behavioral: Negative for depression. The patient is not nervous/anxious and does not have insomnia.       PAST MEDICAL HISTORY :  Past Medical History:  Diagnosis Date  . Asthma   . Bipolar 2 disorder ([place])   . Borderline schizophrenia ([place])   . COPD (chronic obstructive pulmonary disease) ([place])   . Falls infrequently   . Hypertension   . Primary cancer of right lower lobe of lung ([place]) 07/12/2015   Small cell undifferentiated carcinoma of lung.  Diagnosis at [facility] by fine-needle aspiration of lymph node (January, 2017)  . Ulcer     PAST SURGICAL HISTORY :   Past Surgical History:  Procedure Laterality Date  . ABDOMINAL HYSTERECTOMY    . APPENDECTOMY    . borderline schizophrenia    . chronic back pain    . HIP PINNING,CANNULATED Right 12/08/2017   Procedure: CANNULATED HIP PINNING;  Surgeon: Thornton Park, MD;  Location: [facility] ORS;  Service: Orthopedics;  Laterality:  Right;  Marland Kitchen NASAL SINUS SURGERY     x2  . PERIPHERAL VASCULAR CATHETERIZATION N/A 07/30/2015   Procedure: Glori Luis Cath Insertion;  Surgeon: Algernon Huxley, MD;  Location: [place] CV LAB;  Service: Cardiovascular;  Laterality: N/A;  . STOMACH SURGERY      FAMILY HISTORY :   Family History  Problem Relation Age of Onset  . Hypertension Father      SOCIAL HISTORY:   Social History   Tobacco Use  . Smoking status: Former Smoker    Packs/day: 0.50    Years: 30.00    Pack years: 15.00    Last attempt to quit: 06/17/2015    Years since quitting: 2.7  . Smokeless tobacco: Never Used  Substance Use Topics  . Alcohol use: No    Alcohol/week: 0.0 standard drinks  . Drug use: Yes    Types: Cocaine    Comment: States due to cocaine in mouthwash    ALLERGIES:  is allergic to amoxicillin-pot clavulanate and sulfamethoxazole-trimethoprim.  MEDICATIONS:  Current Outpatient Medications  Medication Sig Dispense Refill  . albuterol (PROVENTIL HFA;VENTOLIN HFA) 108 (90 Base) MCG/ACT inhaler Inhale 2 puffs into the lungs every 4 (four) hours as needed for wheezing or shortness of breath.    . cyclobenzaprine (FLEXERIL) 10 MG tablet Take 10 mg by mouth 2 (two) times daily.     Marland Kitchen diltiazem (CARDIZEM CD) 120 MG 24 hr capsule Take 1 capsule (120 mg total) by mouth daily. 30 capsule 0  . divalproex (DEPAKOTE) 500 MG DR tablet Take 2 tablets (1,000 mg total) by mouth every 12 (twelve) hours. 60 tablet 0  . fluticasone (FLONASE) 50 MCG/ACT nasal spray 1 spray by Each Nare route daily.    . Fluticasone-Salmeterol (ADVAIR DISKUS) 500-50 MCG/DOSE AEPB Inhale 1 puff into the lungs 2 (two) times daily. 1 each 3  . ipratropium-albuterol (DUONEB) 0.5-2.5 (3) MG/3ML SOLN Take 3 mLs by nebulization every 4 (four) hours as needed. 360 mL 3  . loratadine (CLARITIN) 10 MG tablet Take 10 mg by mouth daily as needed.     . Multiple Vitamin (MULTIVITAMIN) tablet Take 1 tablet by mouth daily.    Marland Kitchen omeprazole (PRILOSEC) 20 MG capsule Take 20 mg by mouth daily.    . risperiDONE (RISPERDAL) 1 MG tablet Take 1 tablet (1 mg total) by mouth at bedtime. 30 tablet 0  . salmeterol (SEREVENT) 50 MCG/DOSE diskus inhaler Inhale 1 puff into the lungs 2 (two) times daily.    . sucralfate (CARAFATE) 1 g tablet Take 1 tablet (1 g total) by mouth 4 (four) times daily. Dissolve  in 2-3 tbsp warm water, swish and swallow. 120 tablet 3  . tiotropium (SPIRIVA HANDIHALER) 18 MCG inhalation capsule Place 18 mcg into inhaler and inhale daily.    . traMADol (ULTRAM) 50 MG tablet Take 1 tablet by mouth every 6 (six) hours as needed for pain.    Marland Kitchen acetaminophen (TYLENOL) 500 MG tablet Take 500 mg by mouth every 6 (six) hours as needed.     No current facility-administered medications for this visit.     PHYSICAL EXAMINATION: ECOG PERFORMANCE STATUS: 1 - Symptomatic but completely ambulatory  BP 116/81   Pulse (!) 108   Temp 97.9 F (36.6 C) (Tympanic)   Ht 5\' 8"  (1.727 m)   Wt 190 lb (86.2 kg)   BMI 28.89 kg/m   Filed Weights   03/08/18 1013  Weight: 190 lb (86.2 kg)    Physical Exam  Constitutional:  She is oriented to person, place, and time and well-developed, well-nourished, and in no distress.  She is walking by herself.  Accompanied by her brother.  HENT:  Head: Normocephalic and atraumatic.  Mouth/Throat: Oropharynx is clear and moist. No oropharyngeal exudate.  Eyes: Pupils are equal, round, and reactive to light.  Neck: Normal range of motion. Neck supple.  Cardiovascular: Normal rate and regular rhythm.  Pulmonary/Chest: No respiratory distress. She has wheezes.  Decreased air entry bilaterally;   Abdominal: Soft. Bowel sounds are normal. She exhibits no distension and no mass. There is no tenderness. There is no rebound and no guarding.  Musculoskeletal: Normal range of motion. She exhibits no edema or tenderness.  Neurological: She is alert and oriented to person, place, and time.  Skin: Skin is warm.  Chronic ecchymosis bilateral upper lower extremities.  Psychiatric: Affect normal.      LABORATORY DATA:  I have reviewed the data as listed    Component Value Date/Time   NA 138 03/08/2018 0939   K 3.8 03/08/2018 0939   CL 106 03/08/2018 0939   CO2 22 03/08/2018 0939   GLUCOSE 121 (H) 03/08/2018 0939   BUN 12 03/08/2018 0939    CREATININE 0.71 03/08/2018 0939   CALCIUM 9.0 03/08/2018 0939   PROT 6.0 (L) 11/06/2017 1403   ALBUMIN 3.0 (L) 11/06/2017 1403   AST 17 11/06/2017 1403   ALT 8 (L) 11/06/2017 1403   ALKPHOS 66 11/06/2017 1403   BILITOT 0.3 11/06/2017 1403   GFRNONAA >60 03/08/2018 0939   GFRAA >60 03/08/2018 0939    No results found for: SPEP, UPEP  Lab Results  Component Value Date   WBC 7.7 03/08/2018   NEUTROABS 5.1 03/08/2018   HGB 12.8 03/08/2018   HCT 37.9 03/08/2018   MCV 94.7 03/08/2018   PLT 233 03/08/2018      Chemistry      Component Value Date/Time   NA 138 03/08/2018 0939   K 3.8 03/08/2018 0939   CL 106 03/08/2018 0939   CO2 22 03/08/2018 0939   BUN 12 03/08/2018 0939   CREATININE 0.71 03/08/2018 0939      Component Value Date/Time   CALCIUM 9.0 03/08/2018 0939   ALKPHOS 66 11/06/2017 1403   AST 17 11/06/2017 1403   ALT 8 (L) 11/06/2017 1403   BILITOT 0.3 11/06/2017 1403       RADIOGRAPHIC STUDIES: I have personally reviewed the radiological images as listed and agreed with the findings in the report. Dg Chest 2 View  Result Date: 03/08/2018 CLINICAL DATA:  Productive cough for 1 month EXAM: CHEST - 2 VIEW COMPARISON:  12/07/2017 FINDINGS: Right jugular Port-A-Cath is stable. Normal heart size. Right perihilar and lower lobe post radiation scarring. No new pulmonary opacity is present. No pneumothorax or pleural effusion. IMPRESSION: No active cardiopulmonary disease. Electronically Signed   By: Marybelle Killings M.D.   On: 03/08/2018 11:26     ASSESSMENT & PLAN:  Primary cancer of right lower lobe of lung ([place]) #Limited stage small cell lung cancer-status post chemoradiation [April 2017]; CT scan June 2019-negative for any recurrence.  Chronic radiation changes.  Clinically stable/see discussion below.  #Worsening shortness of breath cough wheezing-likely poorly controlled COPD.  Chest x-ray.  Recommend nebulizer at least 3 times a day.  #Chronic  fatigue/dizziness-secondary to whole brain radiation.  Stable.  #Follow-up in 4 months labs; prior CT chest.  Addendum: Chest x-ray reviewed personally no evidence of acute process; patient informed.   Orders  Placed This Encounter  Procedures  . DG Chest 2 View    Standing Status:   Future    Number of Occurrences:   1    Standing Expiration Date:   05/08/2019    Order Specific Question:   Reason for Exam (SYMPTOM  OR DIAGNOSIS REQUIRED)    Answer:   cough; copd; lung cancer    Order Specific Question:   Is the patient pregnant?    Answer:   No    Order Specific Question:   Preferred imaging location?    Answer:   [facility]  . CT CHEST W CONTRAST    Standing Status:   Future    Standing Expiration Date:   03/09/2019    Order Specific Question:   If indicated for the ordered procedure, I authorize the administration of contrast media per Radiology protocol    Answer:   Yes    Order Specific Question:   Preferred imaging location?    Answer:   [facility]    Order Specific Question:   Radiology Contrast Protocol - do NOT remove file path    Answer:   [email or web address]    Order Specific Question:   ** REASON FOR EXAM (FREE TEXT)    Answer:   lung cancer    Order Specific Question:   Is patient pregnant?    Answer:   No  . CBC with Differential/Platelet    Standing Status:   Future    Standing Expiration Date:   03/09/2019  . Basic metabolic panel    Standing Status:   Future    Standing Expiration Date:   03/09/2019   All questions were answered. The patient knows to call the clinic with any problems, questions or concerns.      Cammie Sickle, MD 03/08/2018 9:54 PM

## 2018-03-08 NOTE — Assessment & Plan Note (Addendum)
#  Limited stage small cell lung cancer-status post chemoradiation [April 2017]; CT scan June 2019-negative for any recurrence.  Chronic radiation changes.  Clinically stable/see discussion below.  #Worsening shortness of breath cough wheezing-likely poorly controlled COPD.  Chest x-ray.  Recommend nebulizer at least 3 times a day.  #Chronic fatigue/dizziness-secondary to whole brain radiation.  Stable.  #Follow-up in 4 months labs; prior CT chest.  Addendum: Chest x-ray reviewed personally no evidence of acute process; patient informed.

## 2018-05-19 ENCOUNTER — Inpatient Hospital Stay: Payer: Medicare Other | Attending: Internal Medicine

## 2018-05-19 DIAGNOSIS — C3431 Malignant neoplasm of lower lobe, right bronchus or lung: Secondary | ICD-10-CM | POA: Diagnosis present

## 2018-05-19 DIAGNOSIS — Z95828 Presence of other vascular implants and grafts: Secondary | ICD-10-CM

## 2018-05-19 DIAGNOSIS — Z452 Encounter for adjustment and management of vascular access device: Secondary | ICD-10-CM | POA: Diagnosis present

## 2018-05-19 MED ORDER — HEPARIN SOD (PORK) LOCK FLUSH 100 UNIT/ML IV SOLN
INTRAVENOUS | Status: AC
  Filled 2018-05-19: qty 5

## 2018-05-19 MED ORDER — SODIUM CHLORIDE 0.9% FLUSH
10.0000 mL | Freq: Once | INTRAVENOUS | Status: AC
  Administered 2018-05-19: 10 mL via INTRAVENOUS
  Filled 2018-05-19: qty 10

## 2018-05-19 MED ORDER — HEPARIN SOD (PORK) LOCK FLUSH 100 UNIT/ML IV SOLN
500.0000 [IU] | Freq: Once | INTRAVENOUS | Status: AC
  Administered 2018-05-19: 500 [IU] via INTRAVENOUS

## 2018-06-30 ENCOUNTER — Ambulatory Visit
Admission: RE | Admit: 2018-06-30 | Discharge: 2018-06-30 | Disposition: A | Discharge status: Home / Self Care | Payer: Medicare Other | Source: Ambulatory Visit | Attending: Internal Medicine | Admitting: Internal Medicine

## 2018-06-30 DIAGNOSIS — C3431 Malignant neoplasm of lower lobe, right bronchus or lung: Secondary | ICD-10-CM | POA: Diagnosis present

## 2018-06-30 LAB — POCT I-STAT CREATININE: Creatinine, Ser: 0.8 mg/dL (ref 0.44–1.00)

## 2018-06-30 MED ORDER — IOHEXOL 300 MG/ML  SOLN
75.0000 mL | Freq: Once | INTRAMUSCULAR | Status: AC | PRN
  Administered 2018-06-30: 75 mL via INTRAVENOUS

## 2018-07-05 ENCOUNTER — Encounter: Payer: Self-pay | Admitting: Internal Medicine

## 2018-07-05 ENCOUNTER — Inpatient Hospital Stay: Payer: Medicare Other | Attending: Internal Medicine

## 2018-07-05 ENCOUNTER — Inpatient Hospital Stay (HOSPITAL_BASED_OUTPATIENT_CLINIC_OR_DEPARTMENT_OTHER): Payer: Medicare Other | Admitting: Internal Medicine

## 2018-07-05 VITALS — BP 114/79 | HR 109 | Temp 98.1°F | Resp 16 | Wt 200.6 lb

## 2018-07-05 DIAGNOSIS — R5382 Chronic fatigue, unspecified: Secondary | ICD-10-CM | POA: Diagnosis not present

## 2018-07-05 DIAGNOSIS — J449 Chronic obstructive pulmonary disease, unspecified: Secondary | ICD-10-CM | POA: Diagnosis not present

## 2018-07-05 DIAGNOSIS — R0602 Shortness of breath: Secondary | ICD-10-CM

## 2018-07-05 DIAGNOSIS — C3431 Malignant neoplasm of lower lobe, right bronchus or lung: Secondary | ICD-10-CM

## 2018-07-05 DIAGNOSIS — Z87891 Personal history of nicotine dependence: Secondary | ICD-10-CM | POA: Diagnosis not present

## 2018-07-05 DIAGNOSIS — G8929 Other chronic pain: Secondary | ICD-10-CM | POA: Insufficient documentation

## 2018-07-05 DIAGNOSIS — M545 Low back pain: Secondary | ICD-10-CM | POA: Diagnosis not present

## 2018-07-05 DIAGNOSIS — R05 Cough: Secondary | ICD-10-CM

## 2018-07-05 DIAGNOSIS — R062 Wheezing: Secondary | ICD-10-CM

## 2018-07-05 LAB — BASIC METABOLIC PANEL
Anion gap: 8 (ref 5–15)
BUN: 12 mg/dL (ref 6–20)
CHLORIDE: 104 mmol/L (ref 98–111)
CO2: 28 mmol/L (ref 22–32)
Calcium: 8.6 mg/dL — ABNORMAL LOW (ref 8.9–10.3)
Creatinine, Ser: 0.8 mg/dL (ref 0.44–1.00)
GFR calc Af Amer: >60 mL/min (ref >60)
Glucose, Bld: 93 mg/dL (ref 70–99)
POTASSIUM: 4 mmol/L (ref 3.5–5.1)
SODIUM: 140 mmol/L (ref 135–145)

## 2018-07-05 LAB — CBC WITH DIFFERENTIAL/PLATELET
Abs Immature Granulocytes: 0.07 10*3/uL (ref 0.00–0.07)
Basophils Absolute: 0 10*3/uL (ref 0.0–0.1)
Basophils Relative: 0 %
EOS ABS: 0.2 10*3/uL (ref 0.0–0.5)
EOS PCT: 2 %
HEMATOCRIT: 37.8 % (ref 36.0–46.0)
Hemoglobin: 11.6 g/dL — ABNORMAL LOW (ref 12.0–15.0)
IMMATURE GRANULOCYTES: 1 %
LYMPHS ABS: 1.8 10*3/uL (ref 0.7–4.0)
LYMPHS PCT: 22 %
MCH: 30.3 pg (ref 26.0–34.0)
MCHC: 30.7 g/dL (ref 30.0–36.0)
MCV: 98.7 fL (ref 80.0–100.0)
MONO ABS: 0.7 10*3/uL (ref 0.1–1.0)
MONOS PCT: 9 %
NEUTROS PCT: 66 %
Neutro Abs: 5.4 10*3/uL (ref 1.7–7.7)
PLATELETS: 240 10*3/uL (ref 150–400)
RBC: 3.83 MIL/uL — ABNORMAL LOW (ref 3.87–5.11)
RDW: 14.6 % (ref 11.5–15.5)
WBC: 8.1 10*3/uL (ref 4.0–10.5)
nRBC: 0 % (ref 0.0–0.2)

## 2018-07-05 NOTE — Assessment & Plan Note (Addendum)
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  Chronic radiation changes.  STABLE.  #Continue surveillance at this time.  We will plan to get CT scans every 6 months.  Will order CT scan at next visit.  #poorly controlled COPD-  Recommend nebulizer at least 3 times a day.  Stable  #Chronic fatigue/dizziness-secondary to whole brain radiation.  Stable  # port flush every 6-8 weeks-stable  # DISPOSITION:  # port flush in 1 month # Follow-up in 3 months-MD-cbc.cmp/port flush-Dr.B

## 2018-07-05 NOTE — Progress Notes (Signed)
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  Needle aspiration of lymph node was positive for small cell carcinoma of lung. Clinically  Staged  As  T1 N2 M0 tumor.    2.PET scan shows localized disease. MRI of brain is negative for any metastases  3.  Chemotherapy with cis-platinum and VP-16 has been started on July 18, 2015 4.starting radiation therapy to the chest from August 23 2015 .  5.Last chemotherapy (fourth cycle) October 03, 2015 patient has finished radiation therapy.  # AUG 2017- CT C/A/P- NED- stable RML- scarring.   ---------------------------------------------------------------    DIAGNOSIS:  SMALL CELL LUNG CA  STAGE: LIMITED  ;GOALS: CURATIVE  CURRENT/MOST RECENT THERAPY [ CIS-RT; feb-March 2017] SURVEILLANCE       Primary cancer of right lower lobe of lung ([place])   07/12/2015 Initial Diagnosis    Primary cancer of right lower lobe of lung ([place])       INTERVAL HISTORY:  Charlene [name] 58 y.o.  female pleasant patient above history of small cell lung cancer limited stage is here for follow-up/review the results of the restaging CAT scan.  Patient appetite is good.  No weight loss.   Continues to have chronic shortness of breath chronic cough.  Chronic wheezing.  Denies any headaches.  Complains of chronic fatigue and chronic back pain.  Denies any new onset of headaches.   Review of Systems  Constitutional: Positive for malaise/fatigue. Negative for chills, diaphoresis, fever and weight loss.  HENT: Negative for nosebleeds and sore throat.   Eyes: Negative for double  vision.  Respiratory: Positive for cough, shortness of breath and wheezing. Negative for hemoptysis and sputum production.   Cardiovascular: Negative for chest pain, palpitations, orthopnea and leg swelling.  Gastrointestinal: Negative for abdominal pain, blood in stool, constipation, diarrhea, heartburn, melena, nausea and vomiting.  Genitourinary: Negative for dysuria, frequency and urgency.  Musculoskeletal: Positive for back pain and joint pain.  Skin: Negative.  Negative for itching and rash.  Neurological: Negative for tingling and focal weakness.  Endo/Heme/Allergies: Does not bruise/bleed easily.  Psychiatric/Behavioral: Negative for depression. The patient is not nervous/anxious and does not have insomnia.       PAST MEDICAL HISTORY :  Past Medical History:  Diagnosis Date  . Asthma   . Bipolar 2 disorder ([place])   . Borderline schizophrenia ([place])   . COPD (chronic obstructive pulmonary disease) ([place])   . Falls infrequently   . Hypertension   . Primary cancer of right lower lobe of lung ([place]) 07/12/2015   Small cell undifferentiated carcinoma of lung.  Diagnosis at [facility] by fine-needle aspiration of lymph node (January, 2017)  . Ulcer     PAST SURGICAL HISTORY :   Past Surgical History:  Procedure Laterality Date  . ABDOMINAL HYSTERECTOMY    . APPENDECTOMY    . borderline schizophrenia    . chronic back pain    . HIP PINNING,CANNULATED Right 12/08/2017   Procedure: CANNULATED HIP PINNING;  Surgeon: Thornton Park, MD;  Location: [facility] ORS;  Service: Orthopedics;  Laterality: Right;  .  NASAL SINUS SURGERY     x2  . PERIPHERAL VASCULAR CATHETERIZATION N/A 07/30/2015   Procedure: Glori Luis Cath Insertion;  Surgeon: Algernon Huxley, MD;  Location: [place] [facility];  Service: Cardiovascular;  Laterality: N/A;  . STOMACH SURGERY      FAMILY HISTORY :   Family History  Problem Relation Age of Onset  . Hypertension Father     SOCIAL HISTORY:   Social History    Tobacco Use  . Smoking status: Former Smoker    Packs/day: 0.50    Years: 30.00    Pack years: 15.00    Last attempt to quit: 06/17/2015    Years since quitting: 3.0  . Smokeless tobacco: Never Used  Substance Use Topics  . Alcohol use: No    Alcohol/week: 0.0 standard drinks  . Drug use: Yes    Types: Cocaine    Comment: States due to cocaine in mouthwash    ALLERGIES:  is allergic to amoxicillin-pot clavulanate and sulfamethoxazole-trimethoprim.  MEDICATIONS:  Current Outpatient Medications  Medication Sig Dispense Refill  . acetaminophen (TYLENOL) 500 MG tablet Take 500 mg by mouth every 6 (six) hours as needed.    Marland Kitchen albuterol (PROVENTIL HFA;VENTOLIN HFA) 108 (90 Base) MCG/ACT inhaler Inhale 2 puffs into the lungs every 4 (four) hours as needed for wheezing or shortness of breath.    . cyclobenzaprine (FLEXERIL) 10 MG tablet Take 10 mg by mouth 2 (two) times daily.     Marland Kitchen diltiazem (CARDIZEM CD) 120 MG 24 hr capsule Take 1 capsule (120 mg total) by mouth daily. 30 capsule 0  . divalproex (DEPAKOTE) 500 MG DR tablet Take 2 tablets (1,000 mg total) by mouth every 12 (twelve) hours. 60 tablet 0  . fluticasone (FLONASE) 50 MCG/ACT nasal spray 1 spray by Each Nare route daily.    . Fluticasone-Salmeterol (ADVAIR DISKUS) 500-50 MCG/DOSE AEPB Inhale 1 puff into the lungs 2 (two) times daily. 1 each 3  . ipratropium-albuterol (DUONEB) 0.5-2.5 (3) MG/3ML SOLN Take 3 mLs by nebulization every 4 (four) hours as needed. 360 mL 3  . loratadine (CLARITIN) 10 MG tablet Take 10 mg by mouth daily as needed.     . Multiple Vitamin (MULTIVITAMIN) tablet Take 1 tablet by mouth daily.    Marland Kitchen omeprazole (PRILOSEC) 20 MG capsule Take 20 mg by mouth daily.    . risperiDONE (RISPERDAL) 1 MG tablet Take 1 tablet (1 mg total) by mouth at bedtime. 30 tablet 0  . salmeterol (SEREVENT) 50 MCG/DOSE diskus inhaler Inhale 1 puff into the lungs 2 (two) times daily.    . sucralfate (CARAFATE) 1 g tablet Take 1  tablet (1 g total) by mouth 4 (four) times daily. Dissolve in 2-3 tbsp warm water, swish and swallow. 120 tablet 3  . tiotropium (SPIRIVA HANDIHALER) 18 MCG inhalation capsule Place 18 mcg into inhaler and inhale daily.    . traMADol (ULTRAM) 50 MG tablet Take 1 tablet by mouth every 6 (six) hours as needed for pain.     No current facility-administered medications for this visit.     PHYSICAL EXAMINATION: ECOG PERFORMANCE STATUS: 1 - Symptomatic but completely ambulatory  BP 114/79 (BP Location: Left Arm, Patient Position: Sitting, Cuff Size: Normal)   Pulse (!) 109   Temp 98.1 F (36.7 C) (Tympanic)   Resp 16   Wt 200 lb 9.6 oz (91 kg)   BMI 30.50 kg/m   Filed Weights   07/05/18 0958  Weight: 200 lb 9.6 oz (91  kg)    Physical Exam  Constitutional: She is oriented to person, place, and time and well-developed, well-nourished, and in no distress.  She is walking by herself.  Accompanied by her brother.  HENT:  Head: Normocephalic and atraumatic.  Mouth/Throat: Oropharynx is clear and moist. No oropharyngeal exudate.  Eyes: Pupils are equal, round, and reactive to light.  Neck: Normal range of motion. Neck supple.  Cardiovascular: Normal rate and regular rhythm.  Pulmonary/Chest: No respiratory distress. She has wheezes.  Decreased air entry bilaterally;   Abdominal: Soft. Bowel sounds are normal. She exhibits no distension and no mass. There is no abdominal tenderness. There is no rebound and no guarding.  Musculoskeletal: Normal range of motion.        General: No tenderness or edema.  Neurological: She is alert and oriented to person, place, and time.  Skin: Skin is warm.  Chronic ecchymosis bilateral upper lower extremities.  Psychiatric: Affect normal.      LABORATORY DATA:  I have reviewed the data as listed    Component Value Date/Time   NA 140 07/05/2018 0928   K 4.0 07/05/2018 0928   CL 104 07/05/2018 0928   CO2 28 07/05/2018 0928   GLUCOSE 93 07/05/2018  0928   BUN 12 07/05/2018 0928   CREATININE 0.80 07/05/2018 0928   CALCIUM 8.6 (L) 07/05/2018 0928   PROT 6.0 (L) 11/06/2017 1403   ALBUMIN 3.0 (L) 11/06/2017 1403   AST 17 11/06/2017 1403   ALT 8 (L) 11/06/2017 1403   ALKPHOS 66 11/06/2017 1403   BILITOT 0.3 11/06/2017 1403   GFRNONAA >60 07/05/2018 0928   GFRAA >60 07/05/2018 0928    No results found for: SPEP, UPEP  Lab Results  Component Value Date   WBC 8.1 07/05/2018   NEUTROABS 5.4 07/05/2018   HGB 11.6 (L) 07/05/2018   HCT 37.8 07/05/2018   MCV 98.7 07/05/2018   PLT 240 07/05/2018      Chemistry      Component Value Date/Time   NA 140 07/05/2018 0928   K 4.0 07/05/2018 0928   CL 104 07/05/2018 0928   CO2 28 07/05/2018 0928   BUN 12 07/05/2018 0928   CREATININE 0.80 07/05/2018 0928      Component Value Date/Time   CALCIUM 8.6 (L) 07/05/2018 0928   ALKPHOS 66 11/06/2017 1403   AST 17 11/06/2017 1403   ALT 8 (L) 11/06/2017 1403   BILITOT 0.3 11/06/2017 1403       RADIOGRAPHIC STUDIES: I have personally reviewed the radiological images as listed and agreed with the findings in the report. No results found.   ASSESSMENT & PLAN:  Primary cancer of right lower lobe of lung ([place]) #Limited stage small cell lung cancer-status post chemoradiation [April 2017]; CT scan Jan 2020-negative for any recurrence.  Chronic radiation changes.  STABLE.  #Continue surveillance at this time.  We will plan to get CT scans every 6 months.  Will order CT scan at next visit.  #poorly controlled COPD-  Recommend nebulizer at least 3 times a day.  Stable  #Chronic fatigue/dizziness-secondary to whole brain radiation.  Stable  # port flush every 6-8 weeks-stable  # DISPOSITION:  # port flush in 1 month # Follow-up in 3 months-MD-cbc.cmp/port flush-Dr.B    Orders Placed This Encounter  Procedures  . CBC with Differential/Platelet    Standing Status:   Future    Standing Expiration Date:   07/06/2019  . Comprehensive  metabolic panel    Standing  Status:   Future    Standing Expiration Date:   07/06/2019   All questions were answered. The patient knows to call the clinic with any problems, questions or concerns.      Cammie Sickle, MD 07/05/2018 11:46 AM

## 2018-08-02 ENCOUNTER — Inpatient Hospital Stay: Payer: Medicare Other | Attending: Internal Medicine

## 2018-08-02 DIAGNOSIS — C3431 Malignant neoplasm of lower lobe, right bronchus or lung: Secondary | ICD-10-CM | POA: Diagnosis present

## 2018-08-02 DIAGNOSIS — Z452 Encounter for adjustment and management of vascular access device: Secondary | ICD-10-CM | POA: Diagnosis present

## 2018-08-02 DIAGNOSIS — Z95828 Presence of other vascular implants and grafts: Secondary | ICD-10-CM

## 2018-08-02 MED ORDER — HEPARIN SOD (PORK) LOCK FLUSH 100 UNIT/ML IV SOLN
500.0000 [IU] | Freq: Once | INTRAVENOUS | Status: AC
  Administered 2018-08-02: 500 [IU] via INTRAVENOUS

## 2018-08-02 MED ORDER — SODIUM CHLORIDE 0.9% FLUSH
10.0000 mL | Freq: Once | INTRAVENOUS | Status: AC
  Administered 2018-08-02: 10 mL via INTRAVENOUS
  Filled 2018-08-02: qty 10

## 2018-08-02 MED ORDER — HEPARIN SOD (PORK) LOCK FLUSH 100 UNIT/ML IV SOLN
INTRAVENOUS | Status: AC
  Filled 2018-08-02: qty 5

## 2018-09-28 ENCOUNTER — Telehealth: Payer: Self-pay | Admitting: *Deleted

## 2018-09-28 NOTE — Telephone Encounter (Signed)
Unable to reach this patient to discuss telehealth visit next week.

## 2018-09-30 NOTE — Addendum Note (Signed)
Addended by: Sabino Gasser on: 09/30/2018 11:33 AM   Modules accepted: Orders

## 2018-09-30 NOTE — Telephone Encounter (Addendum)
4'th attempt to reach patient.  Patient finally reached at home phone #. Medications reconciled while patient on phone. Patient does not have a smart phone. She only has a mobile phone. verified that phone number is correct on mobile. I explained that I previously tried to reach out on 4 different occasions and the phone would ring but then would give a busy signal and finally the call was dropped.  Pt states that she was outside when I attempted to call. She was informed to come for her lab/port flush apts and then Dr. Rogue Bussing would contact her later that morning with the telephone telehealth visit.

## 2018-10-04 ENCOUNTER — Inpatient Hospital Stay: Payer: Medicare Other | Admitting: Internal Medicine

## 2018-10-04 ENCOUNTER — Inpatient Hospital Stay: Payer: Medicare Other | Attending: Internal Medicine

## 2018-10-04 ENCOUNTER — Other Ambulatory Visit: Payer: Self-pay

## 2018-10-04 DIAGNOSIS — C3431 Malignant neoplasm of lower lobe, right bronchus or lung: Secondary | ICD-10-CM | POA: Diagnosis present

## 2018-10-04 LAB — CBC WITH DIFFERENTIAL/PLATELET
Abs Immature Granulocytes: 0.01 10*3/uL (ref 0.00–0.07)
Basophils Absolute: 0 10*3/uL (ref 0.0–0.1)
Basophils Relative: 0 %
Eosinophils Absolute: 0.1 10*3/uL (ref 0.0–0.5)
Eosinophils Relative: 2 %
HCT: 36.7 % (ref 36.0–46.0)
Hemoglobin: 11.7 g/dL — ABNORMAL LOW (ref 12.0–15.0)
Immature Granulocytes: 0 %
Lymphocytes Relative: 24 %
Lymphs Abs: 1.8 10*3/uL (ref 0.7–4.0)
MCH: 30.5 pg (ref 26.0–34.0)
MCHC: 31.9 g/dL (ref 30.0–36.0)
MCV: 95.6 fL (ref 80.0–100.0)
Monocytes Absolute: 0.6 10*3/uL (ref 0.1–1.0)
Monocytes Relative: 9 %
Neutro Abs: 4.7 10*3/uL (ref 1.7–7.7)
Neutrophils Relative %: 65 %
Platelets: 212 10*3/uL (ref 150–400)
RBC: 3.84 MIL/uL — ABNORMAL LOW (ref 3.87–5.11)
RDW: 13.6 % (ref 11.5–15.5)
WBC: 7.3 10*3/uL (ref 4.0–10.5)
nRBC: 0 % (ref 0.0–0.2)

## 2018-10-04 LAB — COMPREHENSIVE METABOLIC PANEL
ALT: 6 U/L (ref 0–44)
AST: 22 U/L (ref 15–41)
Albumin: 3.2 g/dL — ABNORMAL LOW (ref 3.5–5.0)
Alkaline Phosphatase: 78 U/L (ref 38–126)
Anion gap: 12 (ref 5–15)
BUN: 13 mg/dL (ref 6–20)
CO2: 23 mmol/L (ref 22–32)
Calcium: 8.5 mg/dL — ABNORMAL LOW (ref 8.9–10.3)
Chloride: 103 mmol/L (ref 98–111)
Creatinine, Ser: 0.88 mg/dL (ref 0.44–1.00)
GFR calc Af Amer: >60 mL/min (ref >60)
GFR calc non Af Amer: >60 mL/min (ref >60)
Glucose, Bld: 87 mg/dL (ref 70–99)
Potassium: 4.1 mmol/L (ref 3.5–5.1)
Sodium: 138 mmol/L (ref 135–145)
Total Bilirubin: 0.2 mg/dL — ABNORMAL LOW (ref 0.3–1.2)
Total Protein: 6.6 g/dL (ref 6.5–8.1)

## 2018-10-04 MED ORDER — HEPARIN SOD (PORK) LOCK FLUSH 100 UNIT/ML IV SOLN
INTRAVENOUS | Status: AC
  Filled 2018-10-04: qty 5

## 2018-10-04 MED ORDER — SODIUM CHLORIDE 0.9% FLUSH
10.0000 mL | INTRAVENOUS | Status: DC | PRN
  Administered 2018-10-04: 10 mL via INTRAVENOUS
  Filled 2018-10-04: qty 10

## 2018-10-04 MED ORDER — HEPARIN SOD (PORK) LOCK FLUSH 100 UNIT/ML IV SOLN
500.0000 [IU] | Freq: Once | INTRAVENOUS | Status: AC
  Administered 2018-10-04: 500 [IU] via INTRAVENOUS

## 2018-10-04 NOTE — Progress Notes (Unsigned)
I connected with Charlene [name] on 10/04/18 at 11:15 AM EDT by {Blank single:19197::"video enabled telemedicine visit","telephone visit"} and verified that I am speaking with the correct person using two identifiers.  I discussed the limitations, risks, security and privacy concerns of performing an evaluation and management service by telemedicine and the availability of in-person appointments. I also discussed with the patient that there may be a patient responsible charge related to this service. The patient expressed understanding and agreed to proceed.    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  Needle aspiration of lymph node was positive for small cell carcinoma of lung. Clinically  Staged  As  T1 N2 M0 tumor.    2.PET scan shows localized disease. MRI of brain is negative for any metastases  3.  Chemotherapy with cis-platinum and VP-16 has been started on July 18, 2015 4.starting radiation therapy to the chest from August 23 2015 .  5.Last chemotherapy (fourth cycle) October 03, 2015 patient has finished radiation therapy.  # AUG 2017- CT C/A/P- NED- stable RML- scarring.   ---------------------------------------------------------------    DIAGNOSIS:  SMALL CELL LUNG CA  STAGE: LIMITED  ;GOALS: CURATIVE  CURRENT/MOST RECENT THERAPY [ CIS-RT; feb-March 2017] SURVEILLANCE       Primary cancer of right lower lobe of lung ([place])   07/12/2015 Initial Diagnosis    Primary cancer of right lower lobe of lung ([place])      Chief Complaint: ***    History of present illness:Charlene [name] 58 y.o.  female with history of   Observation/objective:  Assessment and plan: No problem-specific Assessment & Plan notes found for this  encounter.    Follow-up instructions:  I discussed the assessment and treatment plan with the patient.  The patient was provided an opportunity to ask questions and all were answered.  The patient agreed with the plan and demonstrated understanding of instructions.  The patient was advised to call back or seek an in person evaluation if the symptoms worsen or if the condition fails to improve as anticipated.  I provided *** minutes of {Blank single:19197::"face-to-face video visit time","non face-to-face telephone visit time"} during this encounter, and > 50% was spent counseling as documented under my assessment & plan.   Dr. Charlaine Dalton [place] at [facility] 10/04/2018 10:27 AM

## 2018-10-04 NOTE — Assessment & Plan Note (Signed)
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  Chronic radiation changes.  STABLE.  #Continue surveillance at this time.  We will plan to get CT scans every 6 months.  Will order CT scan at next visit.  #poorly controlled COPD-  Recommend nebulizer at least 3 times a day.  Stable  #Chronic fatigue/dizziness-secondary to whole brain radiation.  Stable  # port flush every 6-8 weeks-stable  # DISPOSITION:  # port flush in 1 month # Follow-up in 3 months-MD-cbc.cmp/port flush-Dr.B

## 2018-10-11 ENCOUNTER — Other Ambulatory Visit: Payer: Self-pay

## 2018-10-12 ENCOUNTER — Inpatient Hospital Stay: Payer: Medicare Other | Admitting: Internal Medicine

## 2018-11-22 ENCOUNTER — Other Ambulatory Visit: Payer: Self-pay | Admitting: Internal Medicine

## 2019-03-16 ENCOUNTER — Other Ambulatory Visit: Payer: Self-pay | Admitting: Internal Medicine

## 2019-07-22 ENCOUNTER — Other Ambulatory Visit: Payer: Self-pay | Admitting: *Deleted

## 2019-07-22 ENCOUNTER — Other Ambulatory Visit: Payer: Self-pay | Admitting: Internal Medicine

## 2019-07-22 DIAGNOSIS — C3431 Malignant neoplasm of lower lobe, right bronchus or lung: Secondary | ICD-10-CM

## 2019-07-22 NOTE — Telephone Encounter (Signed)
Spoke with Dr. Rogue Bussing - md recommended a ct scan f/u and port/labs cbc, metc, ldh (md visit in 2 weeks with scan prior). I contacted the patient. She is agreeable. Msg sent to our scheduling team and prior auth team ct scan insurance approval. Pt ask that we contact her cell phone with these new apts. She is doing well and currently has no medical complaints.

## 2019-07-22 NOTE — Telephone Encounter (Signed)
Patient has not been seen since April 2020. She was to have a 3 month follow up per office note, but it does not seem to have been scheduled and she has no future appts scheduled. Do you want to refill this for her?

## 2019-08-05 ENCOUNTER — Other Ambulatory Visit: Payer: Self-pay | Admitting: *Deleted

## 2019-08-05 ENCOUNTER — Other Ambulatory Visit: Payer: Self-pay

## 2019-08-05 ENCOUNTER — Ambulatory Visit
Admission: RE | Admit: 2019-08-05 | Discharge: 2019-08-05 | Disposition: A | Discharge status: Home / Self Care | Payer: Medicare Other | Source: Ambulatory Visit | Attending: Internal Medicine | Admitting: Internal Medicine

## 2019-08-05 DIAGNOSIS — C3431 Malignant neoplasm of lower lobe, right bronchus or lung: Secondary | ICD-10-CM

## 2019-08-05 LAB — POCT I-STAT CREATININE: Creatinine, Ser: 0.7 mg/dL (ref 0.44–1.00)

## 2019-08-05 MED ORDER — IOHEXOL 300 MG/ML  SOLN
75.0000 mL | Freq: Once | INTRAMUSCULAR | Status: AC | PRN
  Administered 2019-08-05: 75 mL via INTRAVENOUS

## 2019-08-08 ENCOUNTER — Encounter: Payer: Self-pay | Admitting: Internal Medicine

## 2019-08-08 ENCOUNTER — Inpatient Hospital Stay: Payer: Medicare Other | Admitting: *Deleted

## 2019-08-08 ENCOUNTER — Other Ambulatory Visit: Payer: Self-pay

## 2019-08-08 ENCOUNTER — Inpatient Hospital Stay: Payer: Medicare Other | Attending: Internal Medicine | Admitting: Internal Medicine

## 2019-08-08 DIAGNOSIS — Z7951 Long term (current) use of inhaled steroids: Secondary | ICD-10-CM | POA: Diagnosis not present

## 2019-08-08 DIAGNOSIS — Z8249 Family history of ischemic heart disease and other diseases of the circulatory system: Secondary | ICD-10-CM | POA: Diagnosis not present

## 2019-08-08 DIAGNOSIS — R42 Dizziness and giddiness: Secondary | ICD-10-CM | POA: Insufficient documentation

## 2019-08-08 DIAGNOSIS — Z95828 Presence of other vascular implants and grafts: Secondary | ICD-10-CM

## 2019-08-08 DIAGNOSIS — Z90710 Acquired absence of both cervix and uterus: Secondary | ICD-10-CM | POA: Diagnosis not present

## 2019-08-08 DIAGNOSIS — Z9221 Personal history of antineoplastic chemotherapy: Secondary | ICD-10-CM | POA: Insufficient documentation

## 2019-08-08 DIAGNOSIS — I10 Essential (primary) hypertension: Secondary | ICD-10-CM | POA: Diagnosis not present

## 2019-08-08 DIAGNOSIS — E871 Hypo-osmolality and hyponatremia: Secondary | ICD-10-CM | POA: Diagnosis not present

## 2019-08-08 DIAGNOSIS — Z79899 Other long term (current) drug therapy: Secondary | ICD-10-CM | POA: Insufficient documentation

## 2019-08-08 DIAGNOSIS — Z87891 Personal history of nicotine dependence: Secondary | ICD-10-CM | POA: Diagnosis not present

## 2019-08-08 DIAGNOSIS — F3181 Bipolar II disorder: Secondary | ICD-10-CM | POA: Diagnosis not present

## 2019-08-08 DIAGNOSIS — Z923 Personal history of irradiation: Secondary | ICD-10-CM | POA: Diagnosis not present

## 2019-08-08 DIAGNOSIS — Z7189 Other specified counseling: Secondary | ICD-10-CM | POA: Diagnosis not present

## 2019-08-08 DIAGNOSIS — R5382 Chronic fatigue, unspecified: Secondary | ICD-10-CM | POA: Insufficient documentation

## 2019-08-08 DIAGNOSIS — C3431 Malignant neoplasm of lower lobe, right bronchus or lung: Secondary | ICD-10-CM

## 2019-08-08 DIAGNOSIS — M549 Dorsalgia, unspecified: Secondary | ICD-10-CM | POA: Insufficient documentation

## 2019-08-08 DIAGNOSIS — J449 Chronic obstructive pulmonary disease, unspecified: Secondary | ICD-10-CM | POA: Insufficient documentation

## 2019-08-08 DIAGNOSIS — R918 Other nonspecific abnormal finding of lung field: Secondary | ICD-10-CM | POA: Diagnosis not present

## 2019-08-08 LAB — COMPREHENSIVE METABOLIC PANEL
ALT: 10 U/L (ref 0–44)
AST: 23 U/L (ref 15–41)
Albumin: 3.4 g/dL — ABNORMAL LOW (ref 3.5–5.0)
Alkaline Phosphatase: 84 U/L (ref 38–126)
Anion gap: 10 (ref 5–15)
BUN: <5 mg/dL — ABNORMAL LOW (ref 6–20)
CO2: 25 mmol/L (ref 22–32)
Calcium: 8.6 mg/dL — ABNORMAL LOW (ref 8.9–10.3)
Chloride: 93 mmol/L — ABNORMAL LOW (ref 98–111)
Creatinine, Ser: 0.59 mg/dL (ref 0.44–1.00)
GFR calc Af Amer: >60 mL/min (ref >60)
GFR calc non Af Amer: >60 mL/min (ref >60)
Glucose, Bld: 102 mg/dL — ABNORMAL HIGH (ref 70–99)
Potassium: 3.9 mmol/L (ref 3.5–5.1)
Sodium: 128 mmol/L — ABNORMAL LOW (ref 135–145)
Total Bilirubin: 0.3 mg/dL (ref 0.3–1.2)
Total Protein: 6.5 g/dL (ref 6.5–8.1)

## 2019-08-08 LAB — CBC WITH DIFFERENTIAL/PLATELET
Abs Immature Granulocytes: 0.03 10*3/uL (ref 0.00–0.07)
Basophils Absolute: 0.1 10*3/uL (ref 0.0–0.1)
Basophils Relative: 1 %
Eosinophils Absolute: 0.2 10*3/uL (ref 0.0–0.5)
Eosinophils Relative: 2 %
HCT: 37.2 % (ref 36.0–46.0)
Hemoglobin: 12 g/dL (ref 12.0–15.0)
Immature Granulocytes: 0 %
Lymphocytes Relative: 14 %
Lymphs Abs: 1.5 10*3/uL (ref 0.7–4.0)
MCH: 29.1 pg (ref 26.0–34.0)
MCHC: 32.3 g/dL (ref 30.0–36.0)
MCV: 90.1 fL (ref 80.0–100.0)
Monocytes Absolute: 0.8 10*3/uL (ref 0.1–1.0)
Monocytes Relative: 7 %
Neutro Abs: 7.8 10*3/uL — ABNORMAL HIGH (ref 1.7–7.7)
Neutrophils Relative %: 76 %
Platelets: 268 10*3/uL (ref 150–400)
RBC: 4.13 MIL/uL (ref 3.87–5.11)
RDW: 16.2 % — ABNORMAL HIGH (ref 11.5–15.5)
WBC: 10.2 10*3/uL (ref 4.0–10.5)
nRBC: 0 % (ref 0.0–0.2)

## 2019-08-08 MED ORDER — HEPARIN SOD (PORK) LOCK FLUSH 100 UNIT/ML IV SOLN
500.0000 [IU] | Freq: Once | INTRAVENOUS | Status: AC
  Administered 2019-08-08: 500 [IU] via INTRAVENOUS
  Filled 2019-08-08: qty 5

## 2019-08-08 MED ORDER — SODIUM CHLORIDE 0.9% FLUSH
10.0000 mL | Freq: Once | INTRAVENOUS | Status: AC
  Administered 2019-08-08: 10 mL via INTRAVENOUS
  Filled 2019-08-08: qty 10

## 2019-08-08 MED ORDER — TRAMADOL HCL 50 MG PO TABS: 100.0000 mg | ORAL_TABLET | Freq: Four times a day (QID) | ORAL | 0 refills | Status: DC | PRN

## 2019-08-08 NOTE — Progress Notes (Signed)
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  Needle aspiration of lymph node was positive for small cell carcinoma of lung. Clinically  Staged  As  T1 N2 M0 tumor.     2.PET scan shows localized disease. MRI of brain is negative for any metastases   3.  Chemotherapy with cis-platinum and VP-16 has been started on July 18, 2015 4.starting radiation therapy to the chest from August 23 2015 .   5.Last chemotherapy (fourth cycle) October 03, 2015 patient has finished radiation therapy.  # AUG 2017- CT C/A/P- NED- stable RML- scarring.   ---------------------------------------------------------------    DIAGNOSIS:  SMALL CELL LUNG CA  STAGE: LIMITED ; GOALS: CURATIVE  CURRENT/MOST RECENT THERAPY [CIS-RT; feb-March 2017] SURVEILLANCE     Primary cancer of right lower lobe of lung ([place])  07/12/2015 Initial Diagnosis   Primary cancer of right lower lobe of lung ([place])   08/08/2019 -  Chemotherapy   The patient had PALONOSETRON HCL INJECTION 0.25 MG/5ML, 0.25 mg, Intravenous,  Once, 0 of 4 cycles pegfilgrastim-jmdb (FULPHILA) injection 6 mg, 6 mg, Subcutaneous,  Once, 0 of 4 cycles CARBOplatin (PARAPLATIN) in sodium chloride 0.9 % 100 mL chemo infusion, , Intravenous,  Once, 0 of 4 cycles etoposide (VEPESID) 210 mg in sodium chloride 0.9 % 1,000 mL chemo infusion, 100 mg/m2, Intravenous,  Once, 0 of 4 cycles FOSAPREPITANT IV INFUSION 150 MG, 150 mg, Intravenous,  Once, 0 of 4 cycles atezolizumab (TECENTRIQ) 1,200 mg in sodium chloride 0.9 % 250 mL chemo infusion, 1,200 mg, Intravenous, Once,  0 of 8 cycles  for chemotherapy treatment.        INTERVAL HISTORY:  Charlene [name] 59 y.o.  female pleasant patient above history of small cell lung cancer limited stage is here for follow-up/review the results of the restaging CAT scan.  Patient complains of worsening cough worsening shortness of breath.  Also complains of worsening wheezing.  She also complains of poor appetite.   Chronic dizziness.  Not any worse.   Review of Systems  Constitutional: Positive for malaise/fatigue. Negative for chills, diaphoresis, fever and weight loss.  HENT: Negative for nosebleeds and sore throat.   Eyes: Negative for double vision.  Respiratory: Positive for cough, shortness of breath and wheezing. Negative for hemoptysis and sputum production.   Cardiovascular: Negative for chest pain, palpitations, orthopnea and leg swelling.  Gastrointestinal: Negative for abdominal pain, blood in stool, constipation, diarrhea, heartburn, melena, nausea and vomiting.  Genitourinary: Negative for dysuria, frequency and urgency.  Musculoskeletal: Positive for back pain and joint pain.  Skin: Negative.  Negative for itching and rash.  Neurological: Negative for tingling and focal weakness.  Endo/Heme/Allergies: Does not bruise/bleed easily.  Psychiatric/Behavioral: Negative for depression. The patient is not nervous/anxious and does not have insomnia.       PAST MEDICAL HISTORY :  Past Medical History:  Diagnosis Date  . Asthma   . Bipolar 2 disorder ([place])   . Borderline schizophrenia ([place])   . COPD (chronic obstructive pulmonary disease) ([place])   . Falls infrequently   . Hypertension   .  Primary cancer of right lower lobe of lung ([place]) 07/12/2015   Small cell undifferentiated carcinoma of lung.  Diagnosis at [facility] by fine-needle aspiration of lymph node (January, 2017)  . Ulcer     PAST SURGICAL HISTORY :   Past Surgical History:  Procedure Laterality Date  . ABDOMINAL HYSTERECTOMY     . APPENDECTOMY    . borderline schizophrenia    . chronic back pain    . HIP PINNING,CANNULATED Right 12/08/2017   Procedure: CANNULATED HIP PINNING;  Surgeon: Thornton Park, MD;  Location: [facility] ORS;  Service: Orthopedics;  Laterality: Right;  . NASAL SINUS SURGERY     x2  . PERIPHERAL VASCULAR CATHETERIZATION N/A 07/30/2015   Procedure: Glori Luis Cath Insertion;  Surgeon: Algernon Huxley, MD;  Location: [facility];  Service: Cardiovascular;  Laterality: N/A;  . STOMACH SURGERY      FAMILY HISTORY :   Family History  Problem Relation Age of Onset  . Hypertension Father     SOCIAL HISTORY:   Social History   Tobacco Use  . Smoking status: Former Smoker    Packs/day: 0.50    Years: 30.00    Pack years: 15.00    Quit date: 06/17/2015    Years since quitting: 4.1  . Smokeless tobacco: Never Used  Substance Use Topics  . Alcohol use: No    Alcohol/week: 0.0 standard drinks  . Drug use: Yes    Types: Cocaine    Comment: States due to cocaine in mouthwash    ALLERGIES:  is allergic to amoxicillin-pot clavulanate and sulfamethoxazole-trimethoprim.  MEDICATIONS:  Current Outpatient Medications  Medication Sig Dispense Refill  . albuterol (PROVENTIL HFA;VENTOLIN HFA) 108 (90 Base) MCG/ACT inhaler Inhale 2 puffs into the lungs every 4 (four) hours as needed for wheezing or shortness of breath.    . ATROVENT HFA 17 MCG/ACT inhaler Inhale 1 puff into the lungs daily.    . cyclobenzaprine (FLEXERIL) 10 MG tablet Take 10 mg by mouth 2 (two) times daily.     Marland Kitchen diltiazem (CARDIZEM CD) 120 MG 24 hr capsule Take 1 capsule (120 mg total) by mouth daily. 30 capsule 0  . divalproex (DEPAKOTE) 500 MG DR tablet Take 2 tablets (1,000 mg total) by mouth every 12 (twelve) hours. 60 tablet 0  . fluticasone (FLONASE) 50 MCG/ACT nasal spray 1 spray by Each Nare route daily.    Marland Kitchen loratadine (CLARITIN) 10 MG tablet Take 10 mg by mouth daily as needed.     . montelukast (SINGULAIR) 10 MG tablet  TAKE 1 TABLET BY MOUTH AT BEDTIME 60 tablet 1  . Multiple Vitamin (MULTIVITAMIN) tablet Take 1 tablet by mouth daily.    Marland Kitchen omeprazole (PRILOSEC) 20 MG capsule Take 20 mg by mouth daily.    . risperiDONE (RISPERDAL) 1 MG tablet Take 1 tablet (1 mg total) by mouth at bedtime. 30 tablet 0  . traMADol (ULTRAM) 50 MG tablet Take 2 tablets (100 mg total) by mouth every 6 (six) hours as needed. 60 tablet 0  . salmeterol (SEREVENT) 50 MCG/DOSE diskus inhaler Inhale 1 puff into the lungs 2 (two) times daily.     No current facility-administered medications for this visit.    PHYSICAL EXAMINATION: ECOG PERFORMANCE STATUS: 1 - Symptomatic but completely ambulatory  BP 121/85 (BP Location: Left Arm, Patient Position: Sitting, Cuff Size: Normal)   Pulse (!) 105   Temp (!) 97.1 F (36.2 C) (Tympanic)   Wt 207 lb (93.9 kg)  BMI 31.47 kg/m   Filed Weights   08/08/19 1053  Weight: 207 lb (93.9 kg)    Physical Exam  Constitutional: She is oriented to person, place, and time and well-developed, well-nourished, and in no distress.  She is walking by herself.  Accompanied by her father.   HENT:  Head: Normocephalic and atraumatic.  Mouth/Throat: Oropharynx is clear and moist. No oropharyngeal exudate.  Eyes: Pupils are equal, round, and reactive to light.  Cardiovascular: Normal rate and regular rhythm.  Pulmonary/Chest: No respiratory distress. She has wheezes.  Decreased air entry bilaterally;   Abdominal: Soft. Bowel sounds are normal. She exhibits no distension and no mass. There is no abdominal tenderness. There is no rebound and no guarding.  Musculoskeletal:        General: No tenderness or edema. Normal range of motion.     Cervical back: Normal range of motion and neck supple.  Neurological: She is alert and oriented to person, place, and time.  Skin: Skin is warm.  Chronic ecchymosis bilateral upper lower extremities.  Psychiatric: Affect normal.      LABORATORY DATA:  I have  reviewed the data as listed    Component Value Date/Time   NA 128 (L) 08/08/2019 1016   K 3.9 08/08/2019 1016   CL 93 (L) 08/08/2019 1016   CO2 25 08/08/2019 1016   GLUCOSE 102 (H) 08/08/2019 1016   BUN <5 (L) 08/08/2019 1016   CREATININE 0.59 08/08/2019 1016   CALCIUM 8.6 (L) 08/08/2019 1016   PROT 6.5 08/08/2019 1016   ALBUMIN 3.4 (L) 08/08/2019 1016   AST 23 08/08/2019 1016   ALT 10 08/08/2019 1016   ALKPHOS 84 08/08/2019 1016   BILITOT 0.3 08/08/2019 1016   GFRNONAA >60 08/08/2019 1016   GFRAA >60 08/08/2019 1016    No results found for: SPEP, UPEP  Lab Results  Component Value Date   WBC 10.2 08/08/2019   NEUTROABS 7.8 (H) 08/08/2019   HGB 12.0 08/08/2019   HCT 37.2 08/08/2019   MCV 90.1 08/08/2019   PLT 268 08/08/2019      Chemistry      Component Value Date/Time   NA 128 (L) 08/08/2019 1016   K 3.9 08/08/2019 1016   CL 93 (L) 08/08/2019 1016   CO2 25 08/08/2019 1016   BUN <5 (L) 08/08/2019 1016   CREATININE 0.59 08/08/2019 1016      Component Value Date/Time   CALCIUM 8.6 (L) 08/08/2019 1016   ALKPHOS 84 08/08/2019 1016   AST 23 08/08/2019 1016   ALT 10 08/08/2019 1016   BILITOT 0.3 08/08/2019 1016       RADIOGRAPHIC STUDIES: I have personally reviewed the radiological images as listed and agreed with the findings in the report. No results found.   ASSESSMENT & PLAN:  Primary cancer of right lower lobe of lung ([place]) #History of small cell lung cancer- [s/p chemoradiation April 2017]; therapy 2021 CT scan shows-progressive cavitary lesion-right lower lobe/worsening metastatic neuropathy.  Highly concerning for recurrence.  Recommend PET scan ASAP.  Also reviewed the tumor conference regarding biopsy planning.  #Based on imaging/symptoms/hyponatremia-highly concerning for progressive/recurrent cancer.  I would recommend starting chemotherapy ASAP given the aggressive nature of the recurrence.  # #Discussed chemo immunotherapy for extensive stage  small cell lung cancer includes carbo etoposide-Tecentriq every 3 weeks x 4 cycles.  Response would be evaluated with interim imaging.  We will also recommend maintenance Tecentriq post chemotherapy.  I discussed the schedule in detail.  Discussed  treatments are palliative not curative ~discussed the median survival is in the range of around 8- 12 months.  Discussed the potential side effects including but not limited to-increasing fatigue, nausea vomiting, diarrhea, hair loss, sores in the mouth, increase risk of infection and also neuropathy.    I discussed the mechanism of action; The goal of therapy is palliative; and length of treatments are likely ongoing/based upon the results of the scans. Discussed the potential side effects of immunotherapy including but not limited to diarrhea; skin rash; elevated LFTs/endocrine abnormalities etc.  # Poorly controlled COPD; worse/complicated by worsening malignancy.  Recommend nebulizer at least 3 times a day.  # Chronic fatigue/dizziness-secondary to whole brain radiation; will also plan MRI brain down the line.  # # Palliative care evaluation: Introduced palliative care philosophy and services. I discussed the need for palliative care evaluation/symptom management to help quality of life in the context of incurable disease.  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  Again need to get started on chemotherapy ASAP.  If worse would consider adding ADH antagonist.   # back pain- on tramadol; increased to 100 every 6 hours- new script sent  # DISPOSITION:  # PET scan Asap # 1 week- Josh  # in 1 week- MD; labs- cbc/bmp- D-1 carbo-etop- tecentriq; d-2 & d-3- Etop-Dr.B  # I reviewed the blood work- with the patient in detail; also reviewed the imaging independently [as summarized above]; and with the patient in detail.    Orders Placed This Encounter  Procedures  .  NM PET Image Restag (PS) Skull Base To Thigh    Standing Status:   Future    Standing Expiration Date:   08/07/2020    Order Specific Question:   ** REASON FOR EXAM (FREE TEXT)    Answer:   lung cancer; small cell ? recurrence    Order Specific Question:   If indicated for the ordered procedure, I authorize the administration of a radiopharmaceutical per Radiology protocol    Answer:   Yes    Order Specific Question:   Is the patient pregnant?    Answer:   No    Order Specific Question:   Radiology Contrast Protocol - do NOT remove file path    Answer:   [email or web address]   All questions were answered. The patient knows to call the clinic with any problems, questions or concerns.      Cammie Sickle, MD 08/08/2019 12:35 PM

## 2019-08-08 NOTE — Progress Notes (Signed)
START ON PATHWAY REGIMEN - Small Cell Lung     Cycles 1 through 4, every 21 days:     Atezolizumab      Carboplatin      Etoposide    Cycles 5 and beyond, every 21 days:     Atezolizumab   **Always confirm dose/schedule in your pharmacy ordering system**  Patient Characteristics: Relapsed or Progressive Disease, Second Line, Relapse > 6 Months Therapeutic Status: Relapsed or Progressive Disease Line of Therapy: Second Line Time to Relapse: Relapse > 6 Months Intent of Therapy: Non-Curative / Palliative Intent, Discussed with Patient

## 2019-08-08 NOTE — Assessment & Plan Note (Addendum)
#  History of small cell lung cancer- [s/p chemoradiation April 2017]; therapy 2021 CT scan shows-progressive cavitary lesion-right lower lobe/worsening metastatic neuropathy.  Highly concerning for recurrence.  Recommend PET scan ASAP.  Also reviewed the tumor conference regarding biopsy planning.  #Based on imaging/symptoms/hyponatremia-highly concerning for progressive/recurrent cancer.  I would recommend starting chemotherapy ASAP given the aggressive nature of the recurrence.  # #Discussed chemo immunotherapy for extensive stage small cell lung cancer includes carbo etoposide-Tecentriq every 3 weeks x 4 cycles.  Response would be evaluated with interim imaging.  We will also recommend maintenance Tecentriq post chemotherapy.  I discussed the schedule in detail.  Discussed treatments are palliative not curative ~discussed the median survival is in the range of around 8- 12 months.  Discussed the potential side effects including but not limited to-increasing fatigue, nausea vomiting, diarrhea, hair loss, sores in the mouth, increase risk of infection and also neuropathy.    I discussed the mechanism of action; The goal of therapy is palliative; and length of treatments are likely ongoing/based upon the results of the scans. Discussed the potential side effects of immunotherapy including but not limited to diarrhea; skin rash; elevated LFTs/endocrine abnormalities etc.  # Poorly controlled COPD; worse/complicated by worsening malignancy.  Recommend nebulizer at least 3 times a day.  # Chronic fatigue/dizziness-secondary to whole brain radiation; will also plan MRI brain down the line.  # # Palliative care evaluation: Introduced palliative care philosophy and services. I discussed the need for palliative care evaluation/symptom management to help quality of life in the context of incurable disease.  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  Again need to get started on chemotherapy ASAP.  If worse would consider adding ADH antagonist.   # back pain- on tramadol; increased to 100 every 6 hours- new script sent  # DISPOSITION:  # PET scan Asap # 1 week- Josh  # in 1 week- MD; labs- cbc/bmp- D-1 carbo-etop- tecentriq; d-2 & d-3- Etop-Dr.B  # I reviewed the blood work- with the patient in detail; also reviewed the imaging independently [as summarized above]; and with the patient in detail.

## 2019-08-11 ENCOUNTER — Other Ambulatory Visit: Payer: Medicare Other

## 2019-08-11 ENCOUNTER — Other Ambulatory Visit: Payer: Self-pay

## 2019-08-11 ENCOUNTER — Encounter
Admission: RE | Admit: 2019-08-11 | Discharge: 2019-08-11 | Disposition: A | Discharge status: Home / Self Care | Payer: Medicare Other | Source: Ambulatory Visit | Attending: Internal Medicine | Admitting: Internal Medicine

## 2019-08-11 DIAGNOSIS — K769 Liver disease, unspecified: Secondary | ICD-10-CM | POA: Diagnosis not present

## 2019-08-11 DIAGNOSIS — Z79899 Other long term (current) drug therapy: Secondary | ICD-10-CM | POA: Insufficient documentation

## 2019-08-11 DIAGNOSIS — C771 Secondary and unspecified malignant neoplasm of intrathoracic lymph nodes: Secondary | ICD-10-CM | POA: Diagnosis not present

## 2019-08-11 DIAGNOSIS — C3431 Malignant neoplasm of lower lobe, right bronchus or lung: Secondary | ICD-10-CM | POA: Diagnosis not present

## 2019-08-11 LAB — GLUCOSE, CAPILLARY: Glucose-Capillary: 82 mg/dL (ref 70–99)

## 2019-08-11 MED ORDER — FLUDEOXYGLUCOSE F - 18 (FDG) INJECTION
10.7000 | Freq: Once | INTRAVENOUS | Status: AC | PRN
  Administered 2019-08-11: 10:00:00 10.85 via INTRAVENOUS

## 2019-08-11 NOTE — Progress Notes (Signed)
Tumor Board Documentation  Charlene [name] was presented by Dr Rogue Bussing at our Tumor Board on 08/11/2019, which included representatives from medical oncology, radiation oncology, navigation, pathology, radiology, surgical, surgical oncology, internal medicine, pharmacy, genetics, pulmonology, palliative care, research.  Charlene [name] currently presents as a current patient, for discussion with history of the following treatments: neoadjuvant chemoradiation.  Additionally, we reviewed previous medical and familial history, history of present illness, and recent lab results along with all available histopathologic and imaging studies. The tumor board considered available treatment options and made the following recommendations: Active surveillance, Biopsy, Additional screening, Immunotherapy, Chemotherapy    The following procedures/referrals were also placed: No orders of the defined types were placed in this encounter.   Clinical Trial Status: not discussed   Staging used: AJCC Stage Group  AJCC Staging: T: 1 N: 2 M: 0 Group: Limited Stage Small ell Carcinoma of Lung  National site-specific guidelines NCCN were discussed with respect to the case.  Tumor board is a meeting of clinicians from various specialty areas who evaluate and discuss patients for whom a multidisciplinary approach is being considered. Final determinations in the plan of care are those of the provider(s). The responsibility for follow up of recommendations given during tumor board is that of the provider.   Today's extended care, comprehensive team conference, Charlene [name] was not present for the discussion and was not examined.   Multidisciplinary Tumor Board is a multidisciplinary case peer review process.  Decisions discussed in the Multidisciplinary Tumor Board reflect the opinions of the specialists present at the conference without having examined the patient.  Ultimately, treatment and diagnostic decisions rest with the  primary provider(s) and the patient.

## 2019-08-12 ENCOUNTER — Other Ambulatory Visit: Payer: Self-pay | Admitting: *Deleted

## 2019-08-12 ENCOUNTER — Telehealth: Payer: Self-pay | Admitting: *Deleted

## 2019-08-12 ENCOUNTER — Other Ambulatory Visit: Payer: Self-pay | Admitting: Radiology

## 2019-08-12 ENCOUNTER — Other Ambulatory Visit: Payer: Self-pay | Admitting: Internal Medicine

## 2019-08-12 DIAGNOSIS — C3490 Malignant neoplasm of unspecified part of unspecified bronchus or lung: Secondary | ICD-10-CM

## 2019-08-12 DIAGNOSIS — K769 Liver disease, unspecified: Secondary | ICD-10-CM

## 2019-08-12 NOTE — Telephone Encounter (Signed)
Spoke with patient and made her aware of US liver biopsy scheduled for Mon 3/1 at 8:30am. Instructed that will need to arrive at 7:30am at the [facility] to check in. Pt informed that another RN will be calling her this evening to review further instructions prior to her biopsy. Also made aware that Dr. B will give her a call to review her recent PET scan results. Pt verbalized understanding. Nothing further needed at this time.

## 2019-08-15 ENCOUNTER — Other Ambulatory Visit: Payer: Self-pay

## 2019-08-15 ENCOUNTER — Telehealth: Payer: Self-pay | Admitting: Internal Medicine

## 2019-08-15 ENCOUNTER — Ambulatory Visit
Admission: RE | Admit: 2019-08-15 | Discharge: 2019-08-15 | Disposition: A | Discharge status: Home / Self Care | Payer: Medicare Other | Source: Ambulatory Visit | Attending: Internal Medicine | Admitting: Internal Medicine

## 2019-08-15 DIAGNOSIS — C7A8 Other malignant neuroendocrine tumors: Secondary | ICD-10-CM | POA: Insufficient documentation

## 2019-08-15 DIAGNOSIS — K769 Liver disease, unspecified: Secondary | ICD-10-CM | POA: Insufficient documentation

## 2019-08-15 DIAGNOSIS — C3431 Malignant neoplasm of lower lobe, right bronchus or lung: Secondary | ICD-10-CM | POA: Diagnosis not present

## 2019-08-15 DIAGNOSIS — C3490 Malignant neoplasm of unspecified part of unspecified bronchus or lung: Secondary | ICD-10-CM

## 2019-08-15 HISTORY — DX: Gastro-esophageal reflux disease without esophagitis: K21.9

## 2019-08-15 LAB — CBC WITH DIFFERENTIAL/PLATELET
Abs Immature Granulocytes: 0.03 10*3/uL (ref 0.00–0.07)
Basophils Absolute: 0 10*3/uL (ref 0.0–0.1)
Basophils Relative: 0 %
Eosinophils Absolute: 0.2 10*3/uL (ref 0.0–0.5)
Eosinophils Relative: 2 %
HCT: 37.1 % (ref 36.0–46.0)
Hemoglobin: 12 g/dL (ref 12.0–15.0)
Immature Granulocytes: 0 %
Lymphocytes Relative: 16 %
Lymphs Abs: 1.4 10*3/uL (ref 0.7–4.0)
MCH: 29.1 pg (ref 26.0–34.0)
MCHC: 32.3 g/dL (ref 30.0–36.0)
MCV: 89.8 fL (ref 80.0–100.0)
Monocytes Absolute: 0.6 10*3/uL (ref 0.1–1.0)
Monocytes Relative: 7 %
Neutro Abs: 6.5 10*3/uL (ref 1.7–7.7)
Neutrophils Relative %: 75 %
Platelets: 274 10*3/uL (ref 150–400)
RBC: 4.13 MIL/uL (ref 3.87–5.11)
RDW: 16.5 % — ABNORMAL HIGH (ref 11.5–15.5)
WBC: 8.8 10*3/uL (ref 4.0–10.5)
nRBC: 0 % (ref 0.0–0.2)

## 2019-08-15 LAB — COMPREHENSIVE METABOLIC PANEL
ALT: 10 U/L (ref 0–44)
AST: 23 U/L (ref 15–41)
Albumin: 3.3 g/dL — ABNORMAL LOW (ref 3.5–5.0)
Alkaline Phosphatase: 80 U/L (ref 38–126)
Anion gap: 10 (ref 5–15)
BUN: 9 mg/dL (ref 6–20)
CO2: 25 mmol/L (ref 22–32)
Calcium: 8.6 mg/dL — ABNORMAL LOW (ref 8.9–10.3)
Chloride: 95 mmol/L — ABNORMAL LOW (ref 98–111)
Creatinine, Ser: 0.45 mg/dL (ref 0.44–1.00)
GFR calc Af Amer: >60 mL/min (ref >60)
GFR calc non Af Amer: >60 mL/min (ref >60)
Glucose, Bld: 102 mg/dL — ABNORMAL HIGH (ref 70–99)
Potassium: 4.1 mmol/L (ref 3.5–5.1)
Sodium: 130 mmol/L — ABNORMAL LOW (ref 135–145)
Total Bilirubin: 0.5 mg/dL (ref 0.3–1.2)
Total Protein: 6.2 g/dL — ABNORMAL LOW (ref 6.5–8.1)

## 2019-08-15 LAB — PROTIME-INR
INR: 0.9 (ref 0.8–1.2)
Prothrombin Time: 12.5 seconds (ref 11.4–15.2)

## 2019-08-15 MED ORDER — FENTANYL CITRATE (PF) 100 MCG/2ML IJ SOLN
INTRAMUSCULAR | Status: AC | PRN
  Administered 2019-08-15 (×2): 25 ug via INTRAVENOUS
  Administered 2019-08-15: 50 ug via INTRAVENOUS

## 2019-08-15 MED ORDER — LORAZEPAM 2 MG/ML IJ SOLN
INTRAMUSCULAR | Status: AC | PRN
  Administered 2019-08-15: 0.5 mg via INTRAVENOUS

## 2019-08-15 MED ORDER — HYDROCODONE-ACETAMINOPHEN 5-325 MG PO TABS
ORAL_TABLET | ORAL | Status: AC
  Filled 2019-08-15: qty 2

## 2019-08-15 MED ORDER — MIDAZOLAM HCL 2 MG/2ML IJ SOLN
INTRAMUSCULAR | Status: AC
  Filled 2019-08-15: qty 4

## 2019-08-15 MED ORDER — IPRATROPIUM-ALBUTEROL 0.5-2.5 (3) MG/3ML IN SOLN
RESPIRATORY_TRACT | Status: AC
  Filled 2019-08-15: qty 3

## 2019-08-15 MED ORDER — MIDAZOLAM HCL 2 MG/2ML IJ SOLN
INTRAMUSCULAR | Status: AC | PRN
  Administered 2019-08-15: 0.5 mg via INTRAVENOUS
  Administered 2019-08-15: 1 mg via INTRAVENOUS

## 2019-08-15 MED ORDER — SODIUM CHLORIDE 0.9 % IV SOLN: INTRAVENOUS | Status: DC

## 2019-08-15 MED ORDER — IPRATROPIUM-ALBUTEROL 0.5-2.5 (3) MG/3ML IN SOLN
3.0000 mL | Freq: Once | RESPIRATORY_TRACT | Status: AC
  Administered 2019-08-15: 3 mL via RESPIRATORY_TRACT

## 2019-08-15 MED ORDER — HYDROCODONE-ACETAMINOPHEN 5-325 MG PO TABS
1.0000 | ORAL_TABLET | ORAL | Status: DC | PRN
  Administered 2019-08-15: 2 via ORAL

## 2019-08-15 MED ORDER — FENTANYL CITRATE (PF) 100 MCG/2ML IJ SOLN
INTRAMUSCULAR | Status: AC
  Filled 2019-08-15: qty 4

## 2019-08-15 NOTE — Procedures (Signed)
  Procedure: [place] core liver lesion 18g x4 EBL:   minimal Complications:  none immediate  See full dictation in [facility].  Dillard Cannon MD Main # [phone] Pager  [phone]

## 2019-08-15 NOTE — Telephone Encounter (Signed)
On 2/26-  Spoke to pt regarding results of the PET scan-confirms recurrent/progressive disease.  Recommend liver biopsy as scheduled for March 1.  Patient is not on anticoagulation or antiplatelet therapy.

## 2019-08-15 NOTE — Discharge Instructions (Signed)
Moderate Conscious Sedation, Adult, Care After °These instructions provide you with information about caring for yourself after your procedure. Your health care provider may also give you more specific instructions. Your treatment has been planned according to current medical practices, but problems sometimes occur. Call your health care provider if you have any problems or questions after your procedure. °What can I expect after the procedure? °After your procedure, it is common: °· To feel sleepy for several hours. °· To feel clumsy and have poor balance for several hours. °· To have poor judgment for several hours. °· To vomit if you eat too soon. °Follow these instructions at home: °For at least 24 hours after the procedure: ° °· Do not: °? Participate in activities where you could fall or become injured. °? Drive. °? Use heavy machinery. °? Drink alcohol. °? Take sleeping pills or medicines that cause drowsiness. °? Make important decisions or sign legal documents. °? Take care of children on your own. °· Rest. °Eating and drinking °· Follow the diet recommended by your health care provider. °· If you vomit: °? Drink water, juice, or soup when you can drink without vomiting. °? Make sure you have little or no nausea before eating solid foods. °General instructions °· Have a responsible adult stay with you until you are awake and alert. °· Take over-the-counter and prescription medicines only as told by your health care provider. °· If you smoke, do not smoke without supervision. °· Keep all follow-up visits as told by your health care provider. This is important. °Contact a health care provider if: °· You keep feeling nauseous or you keep vomiting. °· You feel light-headed. °· You develop a rash. °· You have a fever. °Get help right away if: °· You have trouble breathing. °This information is not intended to replace advice given to you by your health care provider. Make sure you discuss any questions you have  with your health care provider. °Document Revised: 05/15/2017 Document Reviewed: 09/22/2015 °Elsevier Patient Education © 2020 [employer] °Liver Biopsy, Care After °These instructions give you information on caring for yourself after your procedure. Your doctor may also give you more specific instructions. Call your doctor if you have any problems or questions after your procedure. °What can I expect after the procedure? °After the procedure, it is common to have: °· Pain and soreness where the biopsy was done. °· Bruising around the area where the biopsy was done. °· Sleepiness and be tired for a few days. °Follow these instructions at home: °Medicines °· Take over-the-counter and prescription medicines only as told by your doctor. °· If you were prescribed an antibiotic medicine, take it as told by your doctor. Do not stop taking the antibiotic even if you start to feel better. °· Do not take medicines such as aspirin and ibuprofen. These medicines can thin your blood. Do not take these medicines unless your doctor tells you to take them. °· If you are taking prescription pain medicine, take actions to prevent or treat constipation. Your doctor may recommend that you: °? Drink enough fluid to keep your pee (urine) clear or pale yellow. °? Take over-the-counter or prescription medicines. °? Eat foods that are high in fiber, such as fresh fruits and vegetables, whole grains, and beans. °? Limit foods that are high in fat and processed sugars, such as fried and sweet foods. °Caring for your cut °· Follow instructions from your doctor about how to take care of your cuts from surgery (incisions).   Make sure you: °? Wash your hands with soap and water before you change your bandage (dressing). If you cannot use soap and water, use hand sanitizer. °? Change your bandage as told by your doctor. °? Leave stitches (sutures), skin glue, or skin tape (adhesive) strips in place. They may need to stay in place for 2 weeks or  longer. If tape strips get loose and curl up, you may trim the loose edges. Do not remove tape strips completely unless your doctor says it is okay. °· Check your cuts every day for signs of infection. Check for: °? Redness, swelling, or more pain. °? Fluid or blood. °? Pus or a bad smell. °? Warmth. °· Do not take baths, swim, or use a hot tub until your doctor says it is okay to do so. °Activity ° °· Rest at home for 1-2 days or as told by your doctor. °? Avoid sitting for a long time without moving. Get up to take short walks every 1-2 hours. °· Return to your normal activities as told by your doctor. Ask what activities are safe for you. °· Do not do these things in the first 24 hours: °? Drive. °? Use machinery. °? Take a bath or shower. °· Do not lift more than 10 pounds (4.5 kg) or play contact sports for the first 2 weeks. °General instructions ° °· Do not drink alcohol in the first week after the procedure. °· Have someone stay with you for at least 24 hours after the procedure. °· Get your test results. Ask your doctor or the department that is doing the test: °? When will my results be ready? °? How will I get my results? °? What are my treatment options? °? What other tests do I need? °? What are my next steps? °· Keep all follow-up visits as told by your doctor. This is important. °Contact a doctor if: °· A cut bleeds and leaves more than just a small spot of blood. °· A cut is red, puffs up (swells), or hurts more than before. °· Fluid or something else comes from a cut. °· A cut smells bad. °· You have a fever or chills. °Get help right away if: °· You have swelling, bloating, or pain in your belly (abdomen). °· You get dizzy or faint. °· You have a rash. °· You feel sick to your stomach (nauseous) or throw up (vomit). °· You have trouble breathing, feel short of breath, or feel faint. °· Your chest hurts. °· You have problems talking or seeing. °· You have trouble with your balance or moving your  arms or legs. °Summary °· After the procedure, it is common to have pain, soreness, bruising, and tiredness. °· Your doctor will tell you how to take care of yourself at home. Change your bandage, take your medicines, and limit your activities as told by your doctor. °· Call your doctor if you have symptoms of infection. Get help right away if your belly swells, your cut bleeds a lot, or you have trouble talking or breathing. °This information is not intended to replace advice given to you by your health care provider. Make sure you discuss any questions you have with your health care provider. °Document Revised: 06/12/2017 Document Reviewed: 06/12/2017 °Elsevier Patient Education © 2020 [employer] Inc. ° °

## 2019-08-15 NOTE — Telephone Encounter (Signed)
On 03/01-spoke to patient's father regarding results of the PET scan;  need for the liver biopsy.  Follow-up as planned on 3/3 start chemotherapy.

## 2019-08-16 ENCOUNTER — Other Ambulatory Visit: Payer: Self-pay | Admitting: *Deleted

## 2019-08-16 DIAGNOSIS — C3490 Malignant neoplasm of unspecified part of unspecified bronchus or lung: Secondary | ICD-10-CM

## 2019-08-17 ENCOUNTER — Inpatient Hospital Stay (HOSPITAL_BASED_OUTPATIENT_CLINIC_OR_DEPARTMENT_OTHER): Payer: Medicare Other | Admitting: Internal Medicine

## 2019-08-17 ENCOUNTER — Other Ambulatory Visit: Payer: Self-pay

## 2019-08-17 ENCOUNTER — Inpatient Hospital Stay: Payer: Medicare Other | Attending: Internal Medicine

## 2019-08-17 ENCOUNTER — Inpatient Hospital Stay (HOSPITAL_BASED_OUTPATIENT_CLINIC_OR_DEPARTMENT_OTHER): Payer: Medicare Other | Admitting: Hospice and Palliative Medicine

## 2019-08-17 ENCOUNTER — Encounter: Payer: Self-pay | Admitting: *Deleted

## 2019-08-17 ENCOUNTER — Inpatient Hospital Stay: Payer: Medicare Other

## 2019-08-17 VITALS — BP 120/79 | HR 92 | Temp 97.0°F | Resp 18

## 2019-08-17 VITALS — BP 125/69 | HR 111 | Temp 96.6°F | Ht 68.0 in | Wt 205.6 lb

## 2019-08-17 DIAGNOSIS — I951 Orthostatic hypotension: Secondary | ICD-10-CM | POA: Insufficient documentation

## 2019-08-17 DIAGNOSIS — F3181 Bipolar II disorder: Secondary | ICD-10-CM | POA: Insufficient documentation

## 2019-08-17 DIAGNOSIS — C3431 Malignant neoplasm of lower lobe, right bronchus or lung: Secondary | ICD-10-CM

## 2019-08-17 DIAGNOSIS — Z5112 Encounter for antineoplastic immunotherapy: Secondary | ICD-10-CM | POA: Insufficient documentation

## 2019-08-17 DIAGNOSIS — Z7951 Long term (current) use of inhaled steroids: Secondary | ICD-10-CM | POA: Diagnosis not present

## 2019-08-17 DIAGNOSIS — R197 Diarrhea, unspecified: Secondary | ICD-10-CM | POA: Diagnosis not present

## 2019-08-17 DIAGNOSIS — M25559 Pain in unspecified hip: Secondary | ICD-10-CM

## 2019-08-17 DIAGNOSIS — R5382 Chronic fatigue, unspecified: Secondary | ICD-10-CM | POA: Insufficient documentation

## 2019-08-17 DIAGNOSIS — Z5111 Encounter for antineoplastic chemotherapy: Secondary | ICD-10-CM | POA: Insufficient documentation

## 2019-08-17 DIAGNOSIS — R42 Dizziness and giddiness: Secondary | ICD-10-CM | POA: Diagnosis not present

## 2019-08-17 DIAGNOSIS — E222 Syndrome of inappropriate secretion of antidiuretic hormone: Secondary | ICD-10-CM | POA: Insufficient documentation

## 2019-08-17 DIAGNOSIS — Z9221 Personal history of antineoplastic chemotherapy: Secondary | ICD-10-CM | POA: Diagnosis not present

## 2019-08-17 DIAGNOSIS — M549 Dorsalgia, unspecified: Secondary | ICD-10-CM | POA: Insufficient documentation

## 2019-08-17 DIAGNOSIS — Z923 Personal history of irradiation: Secondary | ICD-10-CM | POA: Diagnosis not present

## 2019-08-17 DIAGNOSIS — Z90710 Acquired absence of both cervix and uterus: Secondary | ICD-10-CM | POA: Diagnosis not present

## 2019-08-17 DIAGNOSIS — I10 Essential (primary) hypertension: Secondary | ICD-10-CM | POA: Diagnosis not present

## 2019-08-17 DIAGNOSIS — Z515 Encounter for palliative care: Secondary | ICD-10-CM

## 2019-08-17 DIAGNOSIS — C3490 Malignant neoplasm of unspecified part of unspecified bronchus or lung: Secondary | ICD-10-CM

## 2019-08-17 DIAGNOSIS — Z79899 Other long term (current) drug therapy: Secondary | ICD-10-CM | POA: Insufficient documentation

## 2019-08-17 DIAGNOSIS — J449 Chronic obstructive pulmonary disease, unspecified: Secondary | ICD-10-CM | POA: Insufficient documentation

## 2019-08-17 DIAGNOSIS — Z87891 Personal history of nicotine dependence: Secondary | ICD-10-CM | POA: Insufficient documentation

## 2019-08-17 DIAGNOSIS — Z7189 Other specified counseling: Secondary | ICD-10-CM

## 2019-08-17 LAB — BASIC METABOLIC PANEL
Anion gap: 11 (ref 5–15)
BUN: 8 mg/dL (ref 6–20)
CO2: 24 mmol/L (ref 22–32)
Calcium: 8.7 mg/dL — ABNORMAL LOW (ref 8.9–10.3)
Chloride: 93 mmol/L — ABNORMAL LOW (ref 98–111)
Creatinine, Ser: 0.63 mg/dL (ref 0.44–1.00)
GFR calc Af Amer: >60 mL/min (ref >60)
GFR calc non Af Amer: >60 mL/min (ref >60)
Glucose, Bld: 129 mg/dL — ABNORMAL HIGH (ref 70–99)
Potassium: 3.9 mmol/L (ref 3.5–5.1)
Sodium: 128 mmol/L — ABNORMAL LOW (ref 135–145)

## 2019-08-17 LAB — CBC WITH DIFFERENTIAL/PLATELET
Abs Immature Granulocytes: 0.03 10*3/uL (ref 0.00–0.07)
Basophils Absolute: 0 10*3/uL (ref 0.0–0.1)
Basophils Relative: 0 %
Eosinophils Absolute: 0.2 10*3/uL (ref 0.0–0.5)
Eosinophils Relative: 2 %
HCT: 36.4 % (ref 36.0–46.0)
Hemoglobin: 11.6 g/dL — ABNORMAL LOW (ref 12.0–15.0)
Immature Granulocytes: 0 %
Lymphocytes Relative: 15 %
Lymphs Abs: 1.2 10*3/uL (ref 0.7–4.0)
MCH: 28.9 pg (ref 26.0–34.0)
MCHC: 31.9 g/dL (ref 30.0–36.0)
MCV: 90.8 fL (ref 80.0–100.0)
Monocytes Absolute: 0.5 10*3/uL (ref 0.1–1.0)
Monocytes Relative: 7 %
Neutro Abs: 6.2 10*3/uL (ref 1.7–7.7)
Neutrophils Relative %: 76 %
Platelets: 265 10*3/uL (ref 150–400)
RBC: 4.01 MIL/uL (ref 3.87–5.11)
RDW: 15.9 % — ABNORMAL HIGH (ref 11.5–15.5)
WBC: 8.1 10*3/uL (ref 4.0–10.5)
nRBC: 0 % (ref 0.0–0.2)

## 2019-08-17 LAB — SURGICAL PATHOLOGY

## 2019-08-17 LAB — TSH: TSH: 4.773 u[IU]/mL — ABNORMAL HIGH (ref 0.350–4.500)

## 2019-08-17 MED ORDER — HYDROCODONE-ACETAMINOPHEN 5-325 MG PO TABS
1.0000 | ORAL_TABLET | Freq: Once | ORAL | Status: AC
  Administered 2019-08-17: 1 via ORAL
  Filled 2019-08-17: qty 1

## 2019-08-17 MED ORDER — SODIUM CHLORIDE 0.9 % IV SOLN
150.0000 mg | Freq: Once | INTRAVENOUS | Status: AC
  Administered 2019-08-17: 150 mg via INTRAVENOUS
  Filled 2019-08-17: qty 150

## 2019-08-17 MED ORDER — SODIUM CHLORIDE 0.9% FLUSH
10.0000 mL | Freq: Once | INTRAVENOUS | Status: AC
  Administered 2019-08-17: 10 mL via INTRAVENOUS
  Filled 2019-08-17: qty 10

## 2019-08-17 MED ORDER — SODIUM CHLORIDE 0.9 % IV SOLN
1200.0000 mg | Freq: Once | INTRAVENOUS | Status: AC
  Administered 2019-08-17: 1200 mg via INTRAVENOUS
  Filled 2019-08-17: qty 20

## 2019-08-17 MED ORDER — SODIUM CHLORIDE 0.9 % IV SOLN
Freq: Once | INTRAVENOUS | Status: AC
  Filled 2019-08-17: qty 250

## 2019-08-17 MED ORDER — ONDANSETRON HCL 8 MG PO TABS: 8.0000 mg | ORAL_TABLET | Freq: Three times a day (TID) | ORAL | 3 refills | Status: AC | PRN

## 2019-08-17 MED ORDER — HEPARIN SOD (PORK) LOCK FLUSH 100 UNIT/ML IV SOLN
500.0000 [IU] | Freq: Once | INTRAVENOUS | Status: DC | PRN
  Filled 2019-08-17: qty 5

## 2019-08-17 MED ORDER — PROCHLORPERAZINE MALEATE 10 MG PO TABS: 10.0000 mg | ORAL_TABLET | Freq: Four times a day (QID) | ORAL | 3 refills | Status: AC | PRN

## 2019-08-17 MED ORDER — SODIUM CHLORIDE 0.9 % IV SOLN
100.0000 mg/m2 | Freq: Once | INTRAVENOUS | Status: AC
  Administered 2019-08-17: 210 mg via INTRAVENOUS
  Filled 2019-08-17: qty 10.5

## 2019-08-17 MED ORDER — LIDOCAINE-PRILOCAINE 2.5-2.5 % EX CREA: 1.0000 "application " | TOPICAL_CREAM | CUTANEOUS | 3 refills | Status: DC | PRN

## 2019-08-17 MED ORDER — HEPARIN SOD (PORK) LOCK FLUSH 100 UNIT/ML IV SOLN
500.0000 [IU] | Freq: Once | INTRAVENOUS | Status: AC
  Administered 2019-08-17: 500 [IU] via INTRAVENOUS
  Filled 2019-08-17: qty 5

## 2019-08-17 MED ORDER — PALONOSETRON HCL INJECTION 0.25 MG/5ML
0.2500 mg | Freq: Once | INTRAVENOUS | Status: AC
  Administered 2019-08-17: 10:00:00 0.25 mg via INTRAVENOUS
  Filled 2019-08-17: qty 5

## 2019-08-17 MED ORDER — SODIUM CHLORIDE 0.9 % IV SOLN
10.0000 mg | Freq: Once | INTRAVENOUS | Status: AC
  Administered 2019-08-17: 10 mg via INTRAVENOUS
  Filled 2019-08-17: qty 10

## 2019-08-17 MED ORDER — SODIUM CHLORIDE 0.9 % IV SOLN
693.0000 mg | Freq: Once | INTRAVENOUS | Status: AC
  Administered 2019-08-17: 13:00:00 690 mg via INTRAVENOUS
  Filled 2019-08-17: qty 69

## 2019-08-17 MED ORDER — HYDROCODONE-ACETAMINOPHEN 5-325 MG PO TABS: 1.0000 | ORAL_TABLET | Freq: Once | ORAL | Status: AC

## 2019-08-17 NOTE — Assessment & Plan Note (Addendum)
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   # Proceed with carbo-Etoposide- Tecentriq today; Labs today reviewed;  acceptable for treatment today.  We will proceed with growth factor support.  #Understands treatments are palliative not curative.  Again reviewed the potential side effects in detail.  Agreement to proceed with treatment.  # Poorly controlled COPD; stable-/complicated by worsening malignancy.  Recommend nebulizer at least 3 times a day.  # Chronic fatigue/dizziness-secondary to whole brain radiation; will also plan MRI brain down the line.  Will order at next visit.  # # Palliative care evaluation: Awaiting evaluation today.  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  Overall stable not any worse.   # back pain/hip pain- on tramadol; increased to 100 every 6 hours; remidended to take at home.   # DISPOSITION:  # chemo today; d-2/d-3; on 3/8- fulphila # in 10 days- [facility]- labs- cbc/bmp; Possible fluids over 1 hour # in 3 week- MD; labs- cbc/bmp- D-1 carbo-etop- tecentriq; d-2 & d-3- Etop; D-6 fluphila--Dr.B

## 2019-08-17 NOTE — Progress Notes (Signed)
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  Needle aspiration of lymph node was positive for small cell carcinoma of lung. Clinically  Staged  As  T1 N2 M0 tumor.     2.PET scan shows localized disease. MRI of brain is negative for any metastases   3.  Chemotherapy with cis-platinum and VP-16 has been started on July 18, 2015 4.starting radiation therapy to the chest from August 23 2015 .   5.Last chemotherapy (fourth cycle) October 03, 2015 patient has finished radiation therapy.  # AUG 2017- CT C/A/P- NED- stable RML- scarring;   # FEB 2021- RECURRENCE; liver bx- pending; MARCH 3rd 2021- CARBO-ETOP-TECEN  ---------------------------------------------------------------    DIAGNOSIS:  SMALL CELL LUNG CA  STAGE: IV ; GOALS: PALLIATIVE  CURRENT/MOST RECENT THERAPY - CARBO-ETOP-TECENTRIQ [C]    Primary cancer of right lower lobe of lung ([place])  07/12/2015 Initial Diagnosis   Primary cancer of right lower lobe of lung ([place])   08/17/2019 -  Chemotherapy   The patient had palonosetron (ALOXI) injection 0.25 mg, 0.25 mg, Intravenous,  Once, 1 of 4 cycles pegfilgrastim-jmdb (FULPHILA) injection 6 mg, 6 mg, Subcutaneous,  Once, 1 of 4 cycles CARBOplatin (PARAPLATIN) 690 mg in sodium chloride 0.9 % 250 mL chemo infusion, 690 mg (100 % of original dose 693 mg), Intravenous,  Once, 1 of 4 cycles Dose modification:   (original dose 693 mg, Cycle 1) etoposide (VEPESID) 210 mg in sodium chloride 0.9 % 1,000 mL chemo infusion, 100 mg/m2 = 210 mg, Intravenous,  Once, 1 of 4 cycles fosaprepitant  (EMEND) 150 mg in sodium chloride 0.9 % 145 mL IVPB, 150 mg, Intravenous,  Once, 1 of 4 cycles atezolizumab (TECENTRIQ) 1,200 mg in sodium chloride 0.9 % 250 mL chemo infusion, 1,200 mg, Intravenous, Once, 1 of 8 cycles  for chemotherapy treatment.        INTERVAL HISTORY:  Charlene [name] 59 y.o.  female pleasant patient above history of small cell lung cancer is here for follow-up/review the results of the restaging PET scan/proceed with chemotherapy.  In the interim based upon the results of the PET scan patient thought to have liver lesion for which she underwent a biopsy.  Patient denies any discomfort from her liver biopsy.  Complains of chronic worsening shortness of breath.  Chronic cough.  Poor appetite.  Chronic dizziness.  Complains of worsening pain in her hip/back.  She has been taking tramadol up to 2 pills a day.  Review of Systems  Constitutional: Positive for malaise/fatigue. Negative for chills, diaphoresis, fever and weight loss.  HENT: Negative for nosebleeds and sore throat.   Eyes: Negative for double vision.  Respiratory: Positive for cough, shortness of breath and wheezing. Negative for hemoptysis and sputum production.   Cardiovascular: Negative for chest pain, palpitations, orthopnea and leg swelling.  Gastrointestinal: Negative for abdominal pain, blood in stool, constipation, diarrhea, heartburn, melena, nausea and vomiting.  Genitourinary: Negative for dysuria, frequency and urgency.  Musculoskeletal: Positive for back pain and joint pain.  Skin: Negative.  Negative for itching and rash.  Neurological:  Negative for tingling and focal weakness.  Endo/Heme/Allergies: Does not bruise/bleed easily.  Psychiatric/Behavioral: Negative for depression. The patient is not nervous/anxious and does not have insomnia.       PAST MEDICAL HISTORY :  Past Medical History:  Diagnosis Date  . Asthma   . Bipolar 2 disorder ([place])   . Borderline schizophrenia ([place])    . COPD (chronic obstructive pulmonary disease) ([place])   . Falls infrequently   . GERD (gastroesophageal reflux disease)   . Hypertension   . Port-A-Cath in place   . Primary cancer of right lower lobe of lung ([place]) 07/12/2015   Small cell undifferentiated carcinoma of lung.  Diagnosis at [facility] by fine-needle aspiration of lymph node (January, 2017)  . Ulcer     PAST SURGICAL HISTORY :   Past Surgical History:  Procedure Laterality Date  . ABDOMINAL HYSTERECTOMY    . APPENDECTOMY    . borderline schizophrenia    . chronic back pain    . HIP PINNING,CANNULATED Right 12/08/2017   Procedure: CANNULATED HIP PINNING;  Surgeon: Thornton Park, MD;  Location: [facility] ORS;  Service: Orthopedics;  Laterality: Right;  . NASAL SINUS SURGERY     x2  . PERIPHERAL VASCULAR CATHETERIZATION N/A 07/30/2015   Procedure: Glori Luis Cath Insertion;  Surgeon: Algernon Huxley, MD;  Location: [place] CV LAB;  Service: Cardiovascular;  Laterality: N/A;  . STOMACH SURGERY      FAMILY HISTORY :   Family History  Problem Relation Age of Onset  . Hypertension Father     SOCIAL HISTORY:   Social History   Tobacco Use  . Smoking status: Former Smoker    Packs/day: 0.50    Years: 30.00    Pack years: 15.00    Quit date: 06/17/2015    Years since quitting: 4.1  . Smokeless tobacco: Never Used  Substance Use Topics  . Alcohol use: No    Alcohol/week: 0.0 standard drinks  . Drug use: Yes    Types: Cocaine    Comment: States due to cocaine in mouthwash    ALLERGIES:  is allergic to amoxicillin-pot clavulanate and sulfamethoxazole-trimethoprim.  MEDICATIONS:  Current Outpatient Medications  Medication Sig Dispense Refill  . albuterol (PROVENTIL HFA;VENTOLIN HFA) 108 (90 Base) MCG/ACT inhaler Inhale 2 puffs into the lungs every 4 (four) hours as needed for wheezing or shortness of breath.    . ATROVENT HFA 17 MCG/ACT inhaler Inhale 1 puff into the lungs daily.    . cyclobenzaprine (FLEXERIL) 10  MG tablet Take 10 mg by mouth 2 (two) times daily.     Marland Kitchen diltiazem (CARDIZEM CD) 120 MG 24 hr capsule Take 1 capsule (120 mg total) by mouth daily. 30 capsule 0  . divalproex (DEPAKOTE) 500 MG DR tablet Take 2 tablets (1,000 mg total) by mouth every 12 (twelve) hours. 60 tablet 0  . fluticasone (FLONASE) 50 MCG/ACT nasal spray 1 spray by Each Nare route daily.    Marland Kitchen loratadine (CLARITIN) 10 MG tablet Take 10 mg by mouth daily as needed.     . montelukast (SINGULAIR) 10 MG tablet TAKE 1 TABLET BY MOUTH AT BEDTIME 60 tablet 1  . Multiple Vitamin (MULTIVITAMIN) tablet Take 1 tablet by mouth daily.    Marland Kitchen omeprazole (PRILOSEC) 20 MG capsule Take 20 mg by mouth daily.    . risperiDONE (RISPERDAL) 1 MG tablet Take 1 tablet (1 mg total) by mouth at bedtime. (Patient taking differently: Take 1 mg by mouth 2 (two) times  daily. ) 30 tablet 0  . salmeterol (SEREVENT) 50 MCG/DOSE diskus inhaler Inhale 1 puff into the lungs 2 (two) times daily.    . traMADol (ULTRAM) 50 MG tablet Take 2 tablets (100 mg total) by mouth every 6 (six) hours as needed. 60 tablet 0  . lidocaine-prilocaine (EMLA) cream Apply 1 application topically as needed. 30 g 3  . ondansetron (ZOFRAN) 8 MG tablet Take 1 tablet (8 mg total) by mouth every 8 (eight) hours as needed for nausea or vomiting. 20 tablet 3  . prochlorperazine (COMPAZINE) 10 MG tablet Take 1 tablet (10 mg total) by mouth every 6 (six) hours as needed for nausea or vomiting. 30 tablet 3   No current facility-administered medications for this visit.   Facility-Administered Medications Ordered in Other Visits  Medication Dose Route Frequency Provider Last Rate Last Admin  . CARBOplatin (PARAPLATIN) 690 mg in sodium chloride 0.9 % 250 mL chemo infusion  690 mg Intravenous Once Cammie Sickle, MD 638 mL/hr at 08/17/19 1249 690 mg at 08/17/19 1249  . etoposide (VEPESID) 210 mg in sodium chloride 0.9 % 1,000 mL chemo infusion  100 mg/m2 (Order-Specific) Intravenous Once  Charlaine Dalton R, MD      . heparin lock flush 100 unit/mL  500 Units Intravenous Once Charlaine Dalton R, MD      . heparin lock flush 100 unit/mL  500 Units Intracatheter Once PRN Cammie Sickle, MD        PHYSICAL EXAMINATION: ECOG PERFORMANCE STATUS: 1 - Symptomatic but completely ambulatory  BP 125/69 (BP Location: Right Arm, Patient Position: Sitting, Cuff Size: Normal)   Pulse (!) 111   Temp (!) 96.6 F (35.9 C) (Tympanic)   Ht 5\' 8"  (1.727 m)   Wt 205 lb 9.6 oz (93.3 kg)   BMI 31.26 kg/m   Filed Weights   08/17/19 0903  Weight: 205 lb 9.6 oz (93.3 kg)    Physical Exam  Constitutional: She is oriented to person, place, and time and well-developed, well-nourished, and in no distress.  She is walking by herself she is alone.  HENT:  Head: Normocephalic and atraumatic.  Mouth/Throat: Oropharynx is clear and moist. No oropharyngeal exudate.  Eyes: Pupils are equal, round, and reactive to light.  Cardiovascular: Normal rate and regular rhythm.  Pulmonary/Chest: No respiratory distress.  Decreased air entry bilaterally;   Abdominal: Soft. Bowel sounds are normal. She exhibits no distension and no mass. There is no abdominal tenderness. There is no rebound and no guarding.  Musculoskeletal:        General: No tenderness or edema. Normal range of motion.     Cervical back: Normal range of motion and neck supple.  Neurological: She is alert and oriented to person, place, and time.  Skin: Skin is warm.  Chronic ecchymosis bilateral upper lower extremities.  Psychiatric: Affect normal.      LABORATORY DATA:  I have reviewed the data as listed    Component Value Date/Time   NA 128 (L) 08/17/2019 0845   K 3.9 08/17/2019 0845   CL 93 (L) 08/17/2019 0845   CO2 24 08/17/2019 0845   GLUCOSE 129 (H) 08/17/2019 0845   BUN 8 08/17/2019 0845   CREATININE 0.63 08/17/2019 0845   CALCIUM 8.7 (L) 08/17/2019 0845   PROT 6.2 (L) 08/15/2019 0739   ALBUMIN 3.3 (L)  08/15/2019 0739   AST 23 08/15/2019 0739   ALT 10 08/15/2019 0739   ALKPHOS 80 08/15/2019 0739   BILITOT 0.5  08/15/2019 0739   GFRNONAA >60 08/17/2019 0845   GFRAA >60 08/17/2019 0845    No results found for: SPEP, UPEP  Lab Results  Component Value Date   WBC 8.1 08/17/2019   NEUTROABS 6.2 08/17/2019   HGB 11.6 (L) 08/17/2019   HCT 36.4 08/17/2019   MCV 90.8 08/17/2019   PLT 265 08/17/2019      Chemistry      Component Value Date/Time   NA 128 (L) 08/17/2019 0845   K 3.9 08/17/2019 0845   CL 93 (L) 08/17/2019 0845   CO2 24 08/17/2019 0845   BUN 8 08/17/2019 0845   CREATININE 0.63 08/17/2019 0845      Component Value Date/Time   CALCIUM 8.7 (L) 08/17/2019 0845   ALKPHOS 80 08/15/2019 0739   AST 23 08/15/2019 0739   ALT 10 08/15/2019 0739   BILITOT 0.5 08/15/2019 0739       RADIOGRAPHIC STUDIES: I have personally reviewed the radiological images as listed and agreed with the findings in the report. No results found.   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   # Proceed with carbo-Etoposide- Tecentriq today; Labs today reviewed;  acceptable for treatment today.  We will proceed with growth factor support.  #Understands treatments are palliative not curative.  Again reviewed the potential side effects in detail.  Agreement to proceed with treatment.  # Poorly controlled COPD; stable-/complicated by worsening malignancy.  Recommend nebulizer at least 3 times a day.  # Chronic fatigue/dizziness-secondary to whole brain radiation; will also plan MRI brain down the line.  Will order at next visit.  # # Palliative care evaluation: Awaiting evaluation today.  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  Overall stable not any worse.   # back pain/hip pain- on tramadol; increased to 100 every 6 hours; remidended to take at home.    # DISPOSITION:  # chemo today; d-2/d-3; on 3/8- fulphila # in 10 days- [facility]- labs- cbc/bmp; Possible fluids over 1 hour # in 3 week- MD; labs- cbc/bmp- D-1 carbo-etop- tecentriq; d-2 & d-3- Etop; D-6 fluphila--Dr.B     Orders Placed This Encounter  Procedures  . TSH    Standing Status:   Future    Number of Occurrences:   1    Standing Expiration Date:   08/16/2020   All questions were answered. The patient knows to call the clinic with any problems, questions or concerns.      Cammie Sickle, MD 08/17/2019 1:09 PM

## 2019-08-17 NOTE — Progress Notes (Signed)
[place]  Telephone:([phone] [phone]   Name: Charlene [name] Date: 08/17/2019 MRN: [id number]  DOB: June 27, 1960  Patient Care Team: System, Pcp Not In as PCP - General    REASON FOR CONSULTATION: [name] is a 59 y.o. 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  Patient was referred to palliative care to help address goals and manage ongoing symptoms.  SOCIAL HISTORY:     reports that she quit smoking about 4 years ago. She has a 15.00 pack-year smoking history. She has never used smokeless tobacco. She reports current drug use. Drug: Cocaine. She reports that she does not drink alcohol.   Patient is unmarried.  She lives at home with her parents and brother.  She has a daughter who lives nearby.  Patient has been disabled many years but previously worked as a Educational psychologist.  ADVANCE DIRECTIVES:  Does not have  CODE STATUS:  PAST MEDICAL HISTORY: Past Medical History:  Diagnosis Date  . Asthma   . Bipolar 2 disorder ([place])   . Borderline schizophrenia ([place])   . COPD (chronic obstructive pulmonary disease) ([place])   . Falls infrequently   . GERD (gastroesophageal reflux disease)   . Hypertension   . Port-A-Cath in place   . Primary cancer of right lower lobe of lung ([place]) 07/12/2015   Small cell undifferentiated carcinoma of lung.  Diagnosis at [facility] by fine-needle aspiration of lymph node (January, 2017)  . Ulcer     PAST SURGICAL HISTORY:  Past Surgical History:  Procedure Laterality Date  . ABDOMINAL HYSTERECTOMY    . APPENDECTOMY    . borderline schizophrenia    . chronic back pain    . HIP PINNING,CANNULATED Right 12/08/2017   Procedure: CANNULATED HIP PINNING;  Surgeon: Thornton Park, MD;  Location: [facility] ORS;  Service: Orthopedics;  Laterality: Right;    . NASAL SINUS SURGERY     x2  . PERIPHERAL VASCULAR CATHETERIZATION N/A 07/30/2015   Procedure: Glori Luis Cath Insertion;  Surgeon: Algernon Huxley, MD;  Location: [place] [facility];  Service: Cardiovascular;  Laterality: N/A;  . STOMACH SURGERY      HEMATOLOGY/ONCOLOGY HISTORY:  Oncology History Overview Note   cancer of right lower lobe of lung (small cell undifferentiated tumor) diagnosis on July 05 2015) at [facility] by bronchoscopy.  Needle aspiration of lymph node was positive for small cell carcinoma of lung. Clinically  Staged  As  T1 N2 M0 tumor.     2.PET scan shows localized disease. MRI of brain is negative for any metastases   3.  Chemotherapy with cis-platinum and VP-16 has been started on July 18, 2015 4.starting radiation therapy to the chest from August 23 2015 .   5.Last chemotherapy (fourth cycle) October 03, 2015 patient has finished radiation therapy.  # AUG 2017- CT C/A/P- NED- stable RML- scarring;   # FEB 2021- RECURRENCE; liver bx- pending; MARCH 3rd 2021- CARBO-ETOP-TECEN  ---------------------------------------------------------------    DIAGNOSIS:  SMALL CELL LUNG CA  STAGE: IV ; GOALS: PALLIATIVE  CURRENT/MOST RECENT THERAPY - CARBO-ETOP-TECENTRIQ [C]    Primary cancer of right lower lobe of lung ([place])  07/12/2015 Initial Diagnosis   Primary cancer of right lower lobe of lung ([place])   08/17/2019 -  Chemotherapy   The patient had palonosetron (ALOXI) injection 0.25 mg, 0.25 mg, Intravenous,  Once, 1 of 4 cycles pegfilgrastim-jmdb (FULPHILA) injection 6 mg, 6 mg, Subcutaneous,  Once, 1 of 4 cycles CARBOplatin (PARAPLATIN) 690 mg in sodium chloride 0.9 % 250 mL chemo infusion, 690 mg (100 % of original dose 693 mg), Intravenous,  Once, 1 of 4 cycles Dose modification:   (original dose 693 mg, Cycle 1) etoposide (VEPESID) 210 mg in sodium chloride 0.9 % 1,000 mL chemo infusion, 100 mg/m2 = 210 mg, Intravenous,  Once, 1 of 4 cycles fosaprepitant  (EMEND) 150 mg in sodium chloride 0.9 % 145 mL IVPB, 150 mg, Intravenous,  Once, 1 of 4 cycles atezolizumab (TECENTRIQ) 1,200 mg in sodium chloride 0.9 % 250 mL chemo infusion, 1,200 mg, Intravenous, Once, 1 of 8 cycles  for chemotherapy treatment.      ALLERGIES:  is allergic to amoxicillin-pot clavulanate and sulfamethoxazole-trimethoprim.  MEDICATIONS:  Current Outpatient Medications  Medication Sig Dispense Refill  . albuterol (PROVENTIL HFA;VENTOLIN HFA) 108 (90 Base) MCG/ACT inhaler Inhale 2 puffs into the lungs every 4 (four) hours as needed for wheezing or shortness of breath.    . ATROVENT HFA 17 MCG/ACT inhaler Inhale 1 puff into the lungs daily.    . cyclobenzaprine (FLEXERIL) 10 MG tablet Take 10 mg by mouth 2 (two) times daily.     Marland Kitchen diltiazem (CARDIZEM CD) 120 MG 24 hr capsule Take 1 capsule (120 mg total) by mouth daily. 30 capsule 0  . divalproex (DEPAKOTE) 500 MG DR tablet Take 2 tablets (1,000 mg total) by mouth every 12 (twelve) hours. 60 tablet 0  . fluticasone (FLONASE) 50 MCG/ACT nasal spray 1 spray by Each Nare route daily.    Marland Kitchen lidocaine-prilocaine (EMLA) cream Apply 1 application topically as needed. 30 g 3  . loratadine (CLARITIN) 10 MG tablet Take 10 mg by mouth daily as needed.     . montelukast (SINGULAIR) 10 MG tablet TAKE 1 TABLET BY MOUTH AT BEDTIME 60 tablet 1  . Multiple Vitamin (MULTIVITAMIN) tablet Take 1 tablet by mouth daily.    Marland Kitchen omeprazole (PRILOSEC) 20 MG capsule Take 20 mg by mouth daily.    . ondansetron (ZOFRAN) 8 MG tablet Take 1 tablet (8 mg total) by mouth every 8 (eight) hours as needed for nausea or vomiting. 20 tablet 3  . prochlorperazine (COMPAZINE) 10 MG tablet Take 1 tablet (10 mg total) by mouth every 6 (six) hours as needed for nausea or vomiting. 30 tablet 3  . risperiDONE (RISPERDAL) 1 MG tablet Take 1 tablet (1 mg total) by mouth at bedtime. (Patient taking differently: Take 1 mg by mouth 2 (two) times daily. ) 30 tablet 0  .  salmeterol (SEREVENT) 50 MCG/DOSE diskus inhaler Inhale 1 puff into the lungs 2 (two) times daily.    . traMADol (ULTRAM) 50 MG tablet Take 2 tablets (100 mg total) by mouth every 6 (six) hours as needed. 60 tablet 0   No current facility-administered medications for this visit.   Facility-Administered Medications Ordered in Other Visits  Medication Dose Route Frequency Provider Last Rate Last Admin  . heparin lock flush 100 unit/mL  500 Units Intracatheter Once PRN Cammie Sickle, MD        VITAL SIGNS: There were no vitals taken for this visit. There were no vitals filed for this visit.  Estimated body mass index is 31.26 kg/m as calculated from the following:   Height as of an earlier encounter on 08/17/19: _0  (1.727 m).   Weight as of an earlier encounter on  08/17/19: 205 lb 9.6 oz (93.3 kg).  LABS: CBC:    Component Value Date/Time   WBC 8.1 08/17/2019 0845   HGB 11.6 (L) 08/17/2019 0845   HCT 36.4 08/17/2019 0845   PLT 265 08/17/2019 0845   MCV 90.8 08/17/2019 0845   NEUTROABS 6.2 08/17/2019 0845   LYMPHSABS 1.2 08/17/2019 0845   MONOABS 0.5 08/17/2019 0845   EOSABS 0.2 08/17/2019 0845   BASOSABS 0.0 08/17/2019 0845   Comprehensive Metabolic Panel:    Component Value Date/Time   NA 128 (L) 08/17/2019 0845   K 3.9 08/17/2019 0845   CL 93 (L) 08/17/2019 0845   CO2 24 08/17/2019 0845   BUN 8 08/17/2019 0845   CREATININE 0.63 08/17/2019 0845   GLUCOSE 129 (H) 08/17/2019 0845   CALCIUM 8.7 (L) 08/17/2019 0845   AST 23 08/15/2019 0739   ALT 10 08/15/2019 0739   ALKPHOS 80 08/15/2019 0739   BILITOT 0.5 08/15/2019 0739   PROT 6.2 (L) 08/15/2019 0739   ALBUMIN 3.3 (L) 08/15/2019 0739    RADIOGRAPHIC STUDIES: CT Chest W Contrast  Result Date: 08/05/2019 CLINICAL DATA:  Lung cancer restaging. EXAM: CT CHEST WITH CONTRAST TECHNIQUE: Multidetector CT imaging of the chest was performed during intravenous contrast administration. CONTRAST:  48m OMNIPAQUE IOHEXOL  300 MG/ML  SOLN COMPARISON:  06/30/2018 FINDINGS: Cardiovascular: Normal heart size.  No pericardial effusion. Mediastinum/Nodes: Normal appearance of the thyroid gland. The trachea appears patent and is midline. Normal appearance of the esophagus. No axillary or supraclavicular adenopathy. Right paratracheal lymph node measures 1.2 cm, image 49/2. This is new from previous exam. New 1.1 cm low right paratracheal lymph node, image 59/2. Lungs/Pleura: No pleural effusion. Centrilobular and paraseptal emphysema with diffuse bronchial wall thickening. Right upper lobe and right middle lobe cavitary lung mass is identified within the distribution of previously noted radiation change. On today's study this measures 8.5 x 3.7 cm, image 67/3. This invades the right hilum with narrowing of the distal right mainstem bronchus as well as the right upper lobe lobar and segmental airways. Similar appearance of radiation change involving the superior segment of right lower lobe, image 81/3 Upper Abdomen: No acute abnormality. Musculoskeletal: No chest wall abnormality. No acute or significant osseous findings. IMPRESSION: 1. Imaging findings compatible with recurrence of disease involving the right upper lobe, right middle lobe. Tumor involves the right hilum and narrows the right upper lobe airway. 2. Interval enlargement of right paratracheal lymph nodes concerning for metastatic adenopathy. Aortic Atherosclerosis (ICD10-I70.0) and Emphysema (ICD10-J43.9). Electronically Signed   By: TKerby MoorsM.D.   On: 08/05/2019 13:02   NM PET Image Restag (PS) Skull Base To Thigh  Result Date: 08/11/2019 CLINICAL DATA:  Subsequent treatment strategy for small cell lung cancer. EXAM: NUCLEAR MEDICINE PET SKULL BASE TO THIGH TECHNIQUE: 10.85 mCi F-18 FDG was injected intravenously. Full-ring PET imaging was performed from the skull base to thigh after the radiotracer. CT data was obtained and used for attenuation correction and  anatomic localization. Fasting blood glucose: 82 mg/dl COMPARISON:  Chest CT 08/05/2019.  Remote PET-CT 07/16/2015 FINDINGS: Mediastinal blood pool activity: SUV max 2.32 Liver activity: SUV max NA NECK: No hypermetabolic lymph nodes in the neck. Incidental CT findings: none CHEST: 6.5 x 5.5 cm cavitary mass in the right upper lobe invading the right hilum is hypermetabolic with SUV max of 162.03consistent with recurrent tumor. Enlarged right paratracheal lymph nodes are also hypermetabolic and consistent with metastatic adenopathy. 8 mm node on image 72/3  has an SUV max of 4.99 and slightly more inferiorly there is a 12 mm node on image 78/3 which has an SUV max of 7.77. No supraclavicular or axillary adenopathy. No breast masses. Areas of hypermetabolism in the supraclavicular fossas bilaterally without associated CT correlate most consistent with hypermetabolic brown fat. No obvious pulmonary nodules to suggest pulmonary metastatic disease. Incidental CT findings: none ABDOMEN/PELVIS: Hypermetabolic lesion in the left hepatic lobe is suspicious for a new hepatic metastatic lesion. This appears to correlate with a vague low-attenuation lesion measuring 14 mm on image 126/3. No other definite hepatic lesions. No adrenal gland lesions are identified. The pancreas, spleen and both kidneys are unremarkable. No abdominal or pelvic lymphadenopathy. Incidental CT findings: none SKELETON: No focal hypermetabolic activity to suggest skeletal metastasis. Incidental CT findings: none IMPRESSION: 1. Recurrent right upper lobe/right hilar tumor and associated adjacent metastatic mediastinal adenopathy. 2. Suspect new left hepatic lobe metastatic lesion. 3. No definite metastatic pulmonary nodules. 4. No findings for osseous metastatic disease. Electronically Signed   By: Marijo Sanes M.D.   On: 08/11/2019 15:15   [place] CORE BIOPSY (LIVER)  Result Date: 08/15/2019 CLINICAL DATA:  Right lower lobe lung small-cell carcinoma.  Hypermetabolic liver lesion on recent PET-CT. EXAM: ULTRASOUND-GUIDED CORE LIVER BIOPSY TECHNIQUE: An ultrasound guided liver biopsy was thoroughly discussed with the patient and questions were answered. The benefits, risks, alternatives, and complications were also discussed. The patient understands and wishes to proceed with the procedure. A verbal as well as written consent was obtained. Survey ultrasound of the liver was performed. At least 3 low-attenuation lesions measuring up to 1.5 cm were identified, in both lobes. The left lesion was inaccessible due to overlying costal cartilage. Therefore, a right anterior lesion was approached. An appropriate skin entry site was determined. Skin site was marked, prepped with chlorhexidine , and draped in usual sterile fashion, and infiltrated locally with 1% lidocaine. Intravenous Fentanyl 163mg and Versed 271mwere administered as conscious sedation during continuous monitoring of the patient's level of consciousness and physiological / cardiorespiratory status by the radiology RN, with a total moderate sedation time of 35 minutes. A 17 gauge trocar needle was advanced under ultrasound guidance into the liver. 4 coaxial 18gauge core samples were then obtained through the guide needle. The guide needle was removed. Post procedure scans demonstrate no apparent complication. COMPLICATIONS: COMPLICATIONS None immediate FINDINGS: At least 3 hypoechoic liver lesions were identified. Representative core biopsy samples obtained as above. IMPRESSION: 1. Technically successful ultrasound guided core liver lesion biopsy. Electronically Signed   By: D Lucrezia Europe.D.   On: 08/15/2019 12:08    PERFORMANCE STATUS (ECOG) : 2 - Symptomatic, <50% confined to bed  Review of Systems Unless otherwise noted, a complete review of systems is negative.  Physical Exam General: NAD Pulmonary: Unlabored Extremities: no edema, no joint deformities Skin: no rashes Neurological: Weakness  but otherwise nonfocal  IMPRESSION: I met with patient in the infusion area.  Introduced palliative care services and attempted to establish therapeutic rapport.  Patient seems to have a reasonable understanding of her current cancer diagnosis.  She says that she has been told by Dr. BrRogue Bussinghat her life expectancy is likely measured in the order of months.  However, [facility] would probably only live a year.  She says "only God knows."  She tells me that her prognosis has "not set in yet."  Symptomatically, patient has chronic  joint pains for which she has been taking tramadol.  Her dose was increased today to 100 mg 4 times daily by Dr. Rogue Bussing.  Patient has now reached a therapeutic filling of this drug.  We will need to rotate to a new opioid if pain remains poorly controlled.  Patient's performance status is somewhat tenuous at baseline.  She says that she ambulates with use of a walker at home.  She requires assistance from her daughter at times to help with bathing and dressing.  Will refer to clinic OT given underlying frailty.  Patient has both Medicare and Medicaid, so could consider home resources if needed.  We will also refer to home palliative care.  I introduced the idea of establishing advanced directives.  Patient says that she would need to think about it.  I will plan to readdress and hopefully complete a MOST form at time of future visit.  PLAN: -Continue current scope of treatment -We will need to address ACP/MOST form -Continue tramadol -Prophylactic bowel regimen -Follow weight trends -Will refer to OT for underlying frailty -Home PC -RTC in 3 weeks   Patient expressed understanding and was in agreement with this plan. She also understands that She can call the clinic at any time with any questions, concerns, or complaints.     Time Total: 20 minutes  Visit consisted of counseling and education  dealing with the complex and emotionally intense issues of symptom management and palliative care in the setting of serious and potentially life-threatening illness.Greater than 50%  of this time was spent counseling and coordinating care related to the above assessment and plan.  Signed by: Altha Harm, PhD, NP-C

## 2019-08-17 NOTE — Progress Notes (Signed)
Pa submitted for patient's emla cream Charlene [name] (Key: [id number]) - [id number] Lidocaine-Prilocaine 2.5-2.5% cream Status: PA Request Created: March 3rd, 2021 [id number] Sent: March 3rd, 2021

## 2019-08-18 ENCOUNTER — Ambulatory Visit: Payer: Medicare Other

## 2019-08-18 ENCOUNTER — Ambulatory Visit: Payer: Medicare Other | Admitting: Internal Medicine

## 2019-08-18 ENCOUNTER — Telehealth: Payer: Self-pay | Admitting: Primary Care

## 2019-08-18 ENCOUNTER — Encounter: Payer: Medicare Other | Admitting: Hospice and Palliative Medicine

## 2019-08-18 ENCOUNTER — Inpatient Hospital Stay: Payer: Medicare Other

## 2019-08-18 ENCOUNTER — Other Ambulatory Visit: Payer: Self-pay

## 2019-08-18 ENCOUNTER — Other Ambulatory Visit: Payer: Medicare Other

## 2019-08-18 VITALS — BP 122/76 | HR 98 | Temp 97.2°F | Resp 20

## 2019-08-18 DIAGNOSIS — Z5112 Encounter for antineoplastic immunotherapy: Secondary | ICD-10-CM | POA: Diagnosis not present

## 2019-08-18 DIAGNOSIS — M25559 Pain in unspecified hip: Secondary | ICD-10-CM

## 2019-08-18 DIAGNOSIS — Z7189 Other specified counseling: Secondary | ICD-10-CM

## 2019-08-18 DIAGNOSIS — C3431 Malignant neoplasm of lower lobe, right bronchus or lung: Secondary | ICD-10-CM

## 2019-08-18 MED ORDER — HYDROCODONE-ACETAMINOPHEN 5-325 MG PO TABS
1.0000 | ORAL_TABLET | Freq: Once | ORAL | Status: AC
  Administered 2019-08-18: 1 via ORAL
  Filled 2019-08-18: qty 1

## 2019-08-18 MED ORDER — HEPARIN SOD (PORK) LOCK FLUSH 100 UNIT/ML IV SOLN
INTRAVENOUS | Status: AC
  Filled 2019-08-18: qty 5

## 2019-08-18 MED ORDER — SODIUM CHLORIDE 0.9% FLUSH
10.0000 mL | INTRAVENOUS | Status: DC | PRN
  Administered 2019-08-18: 10 mL
  Filled 2019-08-18: qty 10

## 2019-08-18 MED ORDER — HEPARIN SOD (PORK) LOCK FLUSH 100 UNIT/ML IV SOLN
500.0000 [IU] | Freq: Once | INTRAVENOUS | Status: AC | PRN
  Administered 2019-08-18: 500 [IU]
  Filled 2019-08-18: qty 5

## 2019-08-18 MED ORDER — SODIUM CHLORIDE 0.9 % IV SOLN
100.0000 mg/m2 | Freq: Once | INTRAVENOUS | Status: AC
  Administered 2019-08-18: 210 mg via INTRAVENOUS
  Filled 2019-08-18: qty 10.5

## 2019-08-18 MED ORDER — SODIUM CHLORIDE 0.9 % IV SOLN
10.0000 mg | Freq: Once | INTRAVENOUS | Status: AC
  Administered 2019-08-18: 10 mg via INTRAVENOUS
  Filled 2019-08-18: qty 10

## 2019-08-18 MED ORDER — SODIUM CHLORIDE 0.9 % IV SOLN
Freq: Once | INTRAVENOUS | Status: AC
  Filled 2019-08-18: qty 250

## 2019-08-18 NOTE — Telephone Encounter (Signed)
Spoke with patient regarding Palliative services and she was in agreement with starting services.  I have scheduled an In-person Consult for 09/01/19 @ 10:30 AM

## 2019-08-19 ENCOUNTER — Other Ambulatory Visit: Payer: Self-pay | Admitting: Internal Medicine

## 2019-08-19 ENCOUNTER — Inpatient Hospital Stay: Payer: Medicare Other

## 2019-08-19 ENCOUNTER — Other Ambulatory Visit: Payer: Self-pay

## 2019-08-19 VITALS — BP 136/79 | HR 97 | Temp 97.0°F | Resp 20

## 2019-08-19 DIAGNOSIS — Z7189 Other specified counseling: Secondary | ICD-10-CM

## 2019-08-19 DIAGNOSIS — C3431 Malignant neoplasm of lower lobe, right bronchus or lung: Secondary | ICD-10-CM

## 2019-08-19 DIAGNOSIS — Z5112 Encounter for antineoplastic immunotherapy: Secondary | ICD-10-CM | POA: Diagnosis not present

## 2019-08-19 MED ORDER — SODIUM CHLORIDE 0.9 % IV SOLN
10.0000 mg | Freq: Once | INTRAVENOUS | Status: AC
  Administered 2019-08-19: 10 mg via INTRAVENOUS
  Filled 2019-08-19: qty 10

## 2019-08-19 MED ORDER — SODIUM CHLORIDE 0.9 % IV SOLN
100.0000 mg/m2 | Freq: Once | INTRAVENOUS | Status: AC
  Administered 2019-08-19: 210 mg via INTRAVENOUS
  Filled 2019-08-19: qty 10.5

## 2019-08-19 MED ORDER — SODIUM CHLORIDE 0.9 % IV SOLN
Freq: Once | INTRAVENOUS | Status: AC
  Filled 2019-08-19: qty 250

## 2019-08-19 MED ORDER — HEPARIN SOD (PORK) LOCK FLUSH 100 UNIT/ML IV SOLN
500.0000 [IU] | Freq: Once | INTRAVENOUS | Status: AC | PRN
  Administered 2019-08-19: 500 [IU]
  Filled 2019-08-19: qty 5

## 2019-08-19 MED ORDER — HYDROCODONE-ACETAMINOPHEN 5-325 MG PO TABS: 1.0000 | ORAL_TABLET | Freq: Two times a day (BID) | ORAL | 0 refills | Status: DC | PRN

## 2019-08-19 MED ORDER — HEPARIN SOD (PORK) LOCK FLUSH 100 UNIT/ML IV SOLN
INTRAVENOUS | Status: AC
  Filled 2019-08-19: qty 5

## 2019-08-19 MED ORDER — HYDROCODONE-ACETAMINOPHEN 5-325 MG PO TABS
1.0000 | ORAL_TABLET | Freq: Once | ORAL | Status: AC
  Administered 2019-08-19: 1 via ORAL
  Filled 2019-08-19: qty 1

## 2019-08-19 NOTE — Progress Notes (Signed)
Pt expressed RN that her back is really bothering her and would like something for her pain. She rates her pain a 8 out of 10 and states "Norco seems to help my pain." MD notified, per MD to order a one time dose of Norco 5/325mg  at this time. Pt updated.   Tomie Elko [employer]

## 2019-08-19 NOTE — Progress Notes (Signed)
Pt asked for pain meds- for back pain. New script sent.

## 2019-08-22 ENCOUNTER — Other Ambulatory Visit: Payer: Self-pay | Admitting: Internal Medicine

## 2019-08-22 ENCOUNTER — Inpatient Hospital Stay: Payer: Medicare Other

## 2019-08-22 ENCOUNTER — Ambulatory Visit
Admission: RE | Admit: 2019-08-22 | Discharge: 2019-08-22 | Disposition: A | Discharge status: Home / Self Care | Payer: Medicare Other | Source: Ambulatory Visit | Attending: Internal Medicine | Admitting: Internal Medicine

## 2019-08-22 ENCOUNTER — Inpatient Hospital Stay (HOSPITAL_BASED_OUTPATIENT_CLINIC_OR_DEPARTMENT_OTHER): Payer: Medicare Other | Admitting: Internal Medicine

## 2019-08-22 ENCOUNTER — Other Ambulatory Visit: Payer: Self-pay

## 2019-08-22 VITALS — BP 106/77 | HR 116 | Temp 96.1°F | Resp 18

## 2019-08-22 DIAGNOSIS — Z7189 Other specified counseling: Secondary | ICD-10-CM

## 2019-08-22 DIAGNOSIS — R296 Repeated falls: Secondary | ICD-10-CM

## 2019-08-22 DIAGNOSIS — R42 Dizziness and giddiness: Secondary | ICD-10-CM

## 2019-08-22 DIAGNOSIS — C3431 Malignant neoplasm of lower lobe, right bronchus or lung: Secondary | ICD-10-CM

## 2019-08-22 DIAGNOSIS — K909 Intestinal malabsorption, unspecified: Secondary | ICD-10-CM

## 2019-08-22 DIAGNOSIS — E860 Dehydration: Secondary | ICD-10-CM

## 2019-08-22 MED ORDER — FLUCONAZOLE 100 MG PO TABS: 100.0000 mg | ORAL_TABLET | Freq: Every day | ORAL | 0 refills | Status: AC

## 2019-08-22 MED ORDER — PEGFILGRASTIM-JMDB 6 MG/0.6ML ~~LOC~~ SOSY
6.0000 mg | PREFILLED_SYRINGE | Freq: Once | SUBCUTANEOUS | Status: AC
  Administered 2019-08-22: 6 mg via SUBCUTANEOUS
  Filled 2019-08-22: qty 0.6

## 2019-08-22 MED ORDER — HEPARIN SOD (PORK) LOCK FLUSH 100 UNIT/ML IV SOLN
INTRAVENOUS | Status: AC
  Filled 2019-08-22: qty 5

## 2019-08-22 MED ORDER — LOPERAMIDE HCL 2 MG PO CAPS
4.0000 mg | ORAL_CAPSULE | Freq: Once | ORAL | Status: AC
  Administered 2019-08-22: 4 mg via ORAL
  Filled 2019-08-22: qty 2

## 2019-08-22 MED ORDER — HEPARIN SOD (PORK) LOCK FLUSH 100 UNIT/ML IV SOLN
500.0000 [IU] | Freq: Once | INTRAVENOUS | Status: AC
  Administered 2019-08-22: 500 [IU]
  Filled 2019-08-22: qty 5

## 2019-08-22 MED ORDER — SODIUM CHLORIDE 0.9% FLUSH
10.0000 mL | Freq: Once | INTRAVENOUS | Status: AC
  Administered 2019-08-22: 10 mL via INTRAVENOUS
  Filled 2019-08-22: qty 10

## 2019-08-22 MED ORDER — SODIUM CHLORIDE 0.9 % IV SOLN
INTRAVENOUS | Status: DC
  Filled 2019-08-22 (×2): qty 250

## 2019-08-22 MED ORDER — GADOBUTROL 1 MMOL/ML IV SOLN
7.0000 mL | Freq: Once | INTRAVENOUS | Status: AC | PRN
  Administered 2019-08-22: 7 mL via INTRAVENOUS

## 2019-08-22 NOTE — Progress Notes (Signed)
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  Needle aspiration of lymph node was positive for small cell carcinoma of lung. Clinically  Staged  As  T1 N2 M0 tumor.     2.PET scan shows localized disease. MRI of brain is negative for any metastases   3.  Chemotherapy with cis-platinum and VP-16 has been started on July 18, 2015 4.starting radiation therapy to the chest from August 23 2015 .   5.Last chemotherapy (fourth cycle) October 03, 2015 patient has finished radiation therapy.  # AUG 2017- CT C/A/P- NED- stable RML- scarring;   # FEB 2021- RECURRENCE; liver bx- pending; MARCH 3rd 2021- CARBO-ETOP-TECEN  ---------------------------------------------------------------    DIAGNOSIS:  SMALL CELL LUNG CA  STAGE: IV ; GOALS: PALLIATIVE  CURRENT/MOST RECENT THERAPY - CARBO-ETOP-TECENTRIQ [C]    Primary cancer of right lower lobe of lung ([place])  07/12/2015 Initial Diagnosis   Primary cancer of right lower lobe of lung ([place])   08/17/2019 -  Chemotherapy   The patient had palonosetron (ALOXI) injection 0.25 mg, 0.25 mg, Intravenous,  Once, 1 of 4 cycles Administration: 0.25 mg (08/17/2019) pegfilgrastim-jmdb (FULPHILA) injection 6 mg, 6 mg, Subcutaneous,  Once, 1 of 4 cycles Administration: 6 mg (08/22/2019) CARBOplatin (PARAPLATIN) 690 mg in sodium chloride 0.9 % 250 mL chemo infusion, 690 mg (100 % of original dose 693 mg), Intravenous,  Once, 1 of 4 cycles Dose modification:   (original dose 693 mg, Cycle 1) Administration: 690 mg (08/17/2019) etoposide (VEPESID) 210 mg in sodium chloride  0.9 % 1,000 mL chemo infusion, 100 mg/m2 = 210 mg, Intravenous,  Once, 1 of 4 cycles Administration: 210 mg (08/17/2019), 210 mg (08/18/2019), 210 mg (08/19/2019) fosaprepitant (EMEND) 150 mg in sodium chloride 0.9 % 145 mL IVPB, 150 mg, Intravenous,  Once, 1 of 4 cycles Administration: 150 mg (08/17/2019) atezolizumab (TECENTRIQ) 1,200 mg in sodium chloride 0.9 % 250 mL chemo infusion, 1,200 mg, Intravenous, Once, 1 of 8 cycles Administration: 1,200 mg (08/17/2019)  for chemotherapy treatment.        INTERVAL HISTORY:  Charlene [name] 59 y.o.  female pleasant patient above history of small cell lung cancer is here for follow-up/review the results of the liver biopsy.  Patient in the interim underwent chemotherapy last week.  Patient is due due to get her growth factor today.  Patient noted to have diarrhea that started about 3 days ago.  3-4 loose stools a day.  She feels dizzy.  Had a fall.  Worsening cough.  She has been using nebulizer 2-3 times a day.  Review of Systems  Constitutional: Positive for malaise/fatigue. Negative for chills, diaphoresis, fever and weight loss.  HENT: Negative for nosebleeds and sore throat.   Eyes: Negative for double vision.  Respiratory: Positive for cough, shortness of breath and wheezing. Negative for hemoptysis and sputum production.   Cardiovascular: Negative for chest pain, palpitations, orthopnea and leg swelling.  Gastrointestinal: Negative for abdominal pain, blood in stool, constipation, diarrhea, heartburn, melena, nausea and vomiting.  Genitourinary: Negative for dysuria, frequency and urgency.  Musculoskeletal: Positive for  back pain and joint pain.  Skin: Negative.  Negative for itching and rash.  Neurological: Negative for tingling and focal weakness.  Endo/Heme/Allergies: Does not bruise/bleed easily.  Psychiatric/Behavioral: Negative for depression. The patient is not nervous/anxious and does not have insomnia.       PAST MEDICAL HISTORY  :  Past Medical History:  Diagnosis Date  . Asthma   . Bipolar 2 disorder ([place])   . Borderline schizophrenia ([place])   . COPD (chronic obstructive pulmonary disease) ([place])   . Falls infrequently   . GERD (gastroesophageal reflux disease)   . Hypertension   . Port-A-Cath in place   . Primary cancer of right lower lobe of lung ([place]) 07/12/2015   Small cell undifferentiated carcinoma of lung.  Diagnosis at [facility] by fine-needle aspiration of lymph node (January, 2017)  . Ulcer     PAST SURGICAL HISTORY :   Past Surgical History:  Procedure Laterality Date  . ABDOMINAL HYSTERECTOMY    . APPENDECTOMY    . borderline schizophrenia    . chronic back pain    . HIP PINNING,CANNULATED Right 12/08/2017   Procedure: CANNULATED HIP PINNING;  Surgeon: Thornton Park, MD;  Location: [facility] ORS;  Service: Orthopedics;  Laterality: Right;  . NASAL SINUS SURGERY     x2  . PERIPHERAL VASCULAR CATHETERIZATION N/A 07/30/2015   Procedure: Glori Luis Cath Insertion;  Surgeon: Algernon Huxley, MD;  Location: [place] [facility];  Service: Cardiovascular;  Laterality: N/A;  . STOMACH SURGERY      FAMILY HISTORY :   Family History  Problem Relation Age of Onset  . Hypertension Father     SOCIAL HISTORY:   Social History   Tobacco Use  . Smoking status: Former Smoker    Packs/day: 0.50    Years: 30.00    Pack years: 15.00    Quit date: 06/17/2015    Years since quitting: 4.1  . Smokeless tobacco: Never Used  Substance Use Topics  . Alcohol use: No    Alcohol/week: 0.0 standard drinks  . Drug use: Yes    Types: Cocaine    Comment: States due to cocaine in mouthwash    ALLERGIES:  is allergic to amoxicillin-pot clavulanate and sulfamethoxazole-trimethoprim.  MEDICATIONS:  Current Outpatient Medications  Medication Sig Dispense Refill  . albuterol (PROVENTIL HFA;VENTOLIN HFA) 108 (90 Base) MCG/ACT inhaler Inhale 2 puffs into the lungs every 4 (four) hours as needed for wheezing or shortness  of breath.    . ATROVENT HFA 17 MCG/ACT inhaler Inhale 1 puff into the lungs daily.    . cyclobenzaprine (FLEXERIL) 10 MG tablet Take 10 mg by mouth 2 (two) times daily.     Marland Kitchen diltiazem (CARDIZEM CD) 120 MG 24 hr capsule Take 1 capsule (120 mg total) by mouth daily. 30 capsule 0  . divalproex (DEPAKOTE) 500 MG DR tablet Take 2 tablets (1,000 mg total) by mouth every 12 (twelve) hours. 60 tablet 0  . fluticasone (FLONASE) 50 MCG/ACT nasal spray 1 spray by Each Nare route daily.    Marland Kitchen HYDROcodone-acetaminophen (NORCO/VICODIN) 5-325 MG tablet Take 1 tablet by mouth every 12 (twelve) hours as needed for moderate pain. 45 tablet 0  . lidocaine-prilocaine (EMLA) cream Apply 1 application topically as needed. 30 g 3  . loratadine (CLARITIN) 10 MG tablet Take 10 mg by mouth daily as needed.     . montelukast (SINGULAIR) 10 MG tablet TAKE 1 TABLET BY MOUTH AT BEDTIME 60 tablet 1  . Multiple  Vitamin (MULTIVITAMIN) tablet Take 1 tablet by mouth daily.    Marland Kitchen omeprazole (PRILOSEC) 20 MG capsule Take 20 mg by mouth daily.    . ondansetron (ZOFRAN) 8 MG tablet Take 1 tablet (8 mg total) by mouth every 8 (eight) hours as needed for nausea or vomiting. 20 tablet 3  . prochlorperazine (COMPAZINE) 10 MG tablet Take 1 tablet (10 mg total) by mouth every 6 (six) hours as needed for nausea or vomiting. 30 tablet 3  . risperiDONE (RISPERDAL) 1 MG tablet Take 1 tablet (1 mg total) by mouth at bedtime. (Patient taking differently: Take 1 mg by mouth 2 (two) times daily. ) 30 tablet 0  . salmeterol (SEREVENT) 50 MCG/DOSE diskus inhaler Inhale 1 puff into the lungs 2 (two) times daily.    . traMADol (ULTRAM) 50 MG tablet Take 2 tablets (100 mg total) by mouth every 6 (six) hours as needed. (Patient not taking: Reported on 08/22/2019) 60 tablet 0   No current facility-administered medications for this visit.   Facility-Administered Medications Ordered in Other Visits  Medication Dose Route Frequency Provider Last Rate Last  Admin  . 0.9 %  sodium chloride infusion   Intravenous Continuous Charlaine Dalton R, MD      . heparin lock flush 100 unit/mL  500 Units Intracatheter Once Cammie Sickle, MD      . loperamide (IMODIUM) capsule 4 mg  4 mg Oral Once Charlaine Dalton R, MD      . sodium chloride flush (NS) 0.9 % injection 10 mL  10 mL Intravenous Once Cammie Sickle, MD        PHYSICAL EXAMINATION: ECOG PERFORMANCE STATUS: 1 - Symptomatic but completely ambulatory  BP 106/77   Pulse (!) 116   Temp (!) 96.1 F (35.6 C) (Tympanic)   Resp 18   SpO2 100%   There were no vitals filed for this visit.  Physical Exam  Constitutional: She is oriented to person, place, and time and well-developed, well-nourished, and in no distress.  She is walking by herself she is alone.  HENT:  Head: Normocephalic and atraumatic.  Mouth/Throat: Oropharynx is clear and moist. No oropharyngeal exudate.  Eyes: Pupils are equal, round, and reactive to light.  Cardiovascular: Normal rate and regular rhythm.  Pulmonary/Chest: No respiratory distress.  Decreased air entry bilaterally;   Abdominal: Soft. Bowel sounds are normal. She exhibits no distension and no mass. There is no abdominal tenderness. There is no rebound and no guarding.  Musculoskeletal:        General: No tenderness or edema. Normal range of motion.     Cervical back: Normal range of motion and neck supple.  Neurological: She is alert and oriented to person, place, and time.  Skin: Skin is warm.  Chronic ecchymosis bilateral upper lower extremities.  Psychiatric: Affect normal.      LABORATORY DATA:  I have reviewed the data as listed    Component Value Date/Time   NA 128 (L) 08/17/2019 0845   K 3.9 08/17/2019 0845   CL 93 (L) 08/17/2019 0845   CO2 24 08/17/2019 0845   GLUCOSE 129 (H) 08/17/2019 0845   BUN 8 08/17/2019 0845   CREATININE 0.63 08/17/2019 0845   CALCIUM 8.7 (L) 08/17/2019 0845   PROT 6.2 (L) 08/15/2019 0739    ALBUMIN 3.3 (L) 08/15/2019 0739   AST 23 08/15/2019 0739   ALT 10 08/15/2019 0739   ALKPHOS 80 08/15/2019 0739   BILITOT 0.5 08/15/2019 0739   GFRNONAA >  60 08/17/2019 0845   GFRAA >60 08/17/2019 0845    No results found for: SPEP, UPEP  Lab Results  Component Value Date   WBC 8.1 08/17/2019   NEUTROABS 6.2 08/17/2019   HGB 11.6 (L) 08/17/2019   HCT 36.4 08/17/2019   MCV 90.8 08/17/2019   PLT 265 08/17/2019      Chemistry      Component Value Date/Time   NA 128 (L) 08/17/2019 0845   K 3.9 08/17/2019 0845   CL 93 (L) 08/17/2019 0845   CO2 24 08/17/2019 0845   BUN 8 08/17/2019 0845   CREATININE 0.63 08/17/2019 0845      Component Value Date/Time   CALCIUM 8.7 (L) 08/17/2019 0845   ALKPHOS 80 08/15/2019 0739   AST 23 08/15/2019 0739   ALT 10 08/15/2019 0739   BILITOT 0.5 08/15/2019 0739       RADIOGRAPHIC STUDIES: I have personally reviewed the radiological images as listed and agreed with the findings in the report. No results found.   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  I reviewed the pathology and stage of cancer with the patient and father in detail.  #Discussed this is unfortunately aggressive cancer stage IV incurable.  Treatments are palliative; median survival in the order of 8 to 12 months.  Given patient's poor tolerance to chemotherapy/borderline performance status high risk of side effects/less than average survival.  # s/p carbo-Etoposide- Tecentriq- #1.  Diarrhea likely secondary to chemotherapy.  Recommend Imodium.  Hold off steroids.  #Diarrhea grade 3/orthostatic hypotension-recommend IV fluids over 1 hour.  # Poorly controlled COPD; stable-/complicated by worsening malignancy.  Recommend nebulizer at least 3 times a day.  # Chronic fatigue/dizziness-worsening-s/p fall.  Concerning for malignancy involving the  brain.   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  Monitor closely.  # back pain/hip pain- not controlled likely arthritis not malignancy.  Continue hydrocodone as needed.  # DISPOSITION:  # IVFs over 1hour today. # follow up in [facility] in 2 days- labs- cbc/bmp;IVFs over 1 hour # MRI brain ASAP # keep appts as planned-Dr.B     Orders Placed This Encounter  Procedures  . MR Brain W Contrast    Standing Status:   Future    Standing Expiration Date:   08/21/2020    Order Specific Question:   ** REASON FOR EXAM (FREE TEXT)    Answer:   dizzyness- falls; small cell lung cancer;    Order Specific Question:   If indicated for the ordered procedure, I authorize the administration of contrast media per Radiology protocol    Answer:   Yes    Order Specific Question:   What is the patient's sedation requirement?    Answer:   No Sedation    Order Specific Question:   Does the patient have a pacemaker or implanted devices?    Answer:   No    Order Specific Question:   Use SRS Protocol?    Answer:   No    Order Specific Question:   Radiology Contrast Protocol - do NOT remove file path    Answer:   [email or web address]    Order Specific Question:   Preferred imaging location?    Answer:   [facility] (table limit-400lbs)   All questions were answered. The patient knows to call the clinic with any problems, questions or concerns.  Cammie Sickle, MD 08/22/2019 10:36 AM

## 2019-08-22 NOTE — Progress Notes (Signed)
Patient presented to clinic today for her fulphila injection. She requested to be added on to Dr. Sharmaine Base schedule to discuss her liver biopsy results. Patient c/o intermittent dizziness and fatigue and 4+ loose stools a day. Last dose of imodium last evening.

## 2019-08-22 NOTE — Assessment & Plan Note (Addendum)
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  I reviewed the pathology and stage of cancer with the patient and father in detail.  #Discussed this is unfortunately aggressive cancer stage IV incurable.  Treatments are palliative; median survival in the order of 8 to 12 months.  Given patient's poor tolerance to chemotherapy/borderline performance status high risk of side effects/less than average survival.  # s/p carbo-Etoposide- Tecentriq- #1.  Diarrhea likely secondary to chemotherapy.  Recommend Imodium.  Hold off steroids.  #Diarrhea grade 3/orthostatic hypotension-recommend IV fluids over 1 hour.  # Poorly controlled COPD; stable-/complicated by worsening malignancy.  Recommend nebulizer at least 3 times a day.  # Chronic fatigue/dizziness-worsening-s/p fall.  Concerning for malignancy involving the brain.   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  Monitor closely.  # back pain/hip pain- not controlled likely arthritis not malignancy.  Continue hydrocodone as needed.  #Debility/falls-secondary to underlying malignancy/side effects of chemotherapy.  Patient needing help with ADLs.  Recommend home health.  If continued decline-nursing home placement was also discussed.   # DISPOSITION:  # IVFs over 1hour today. # follow up in [facility] in 2 days- labs- cbc/bmp;IVFs over 1 hour # MRI brain ASAP # keep appts as planned-Dr.B

## 2019-08-22 NOTE — Progress Notes (Signed)
Patient had 3 loose stools while in clinic today.

## 2019-08-23 ENCOUNTER — Other Ambulatory Visit: Payer: Self-pay | Admitting: *Deleted

## 2019-08-23 DIAGNOSIS — C3490 Malignant neoplasm of unspecified part of unspecified bronchus or lung: Secondary | ICD-10-CM

## 2019-08-23 DIAGNOSIS — C3431 Malignant neoplasm of lower lobe, right bronchus or lung: Secondary | ICD-10-CM

## 2019-08-23 DIAGNOSIS — R29898 Other symptoms and signs involving the musculoskeletal system: Secondary | ICD-10-CM

## 2019-08-23 DIAGNOSIS — R296 Repeated falls: Secondary | ICD-10-CM

## 2019-08-24 ENCOUNTER — Inpatient Hospital Stay (HOSPITAL_BASED_OUTPATIENT_CLINIC_OR_DEPARTMENT_OTHER): Payer: Medicare Other | Admitting: Hospice and Palliative Medicine

## 2019-08-24 ENCOUNTER — Inpatient Hospital Stay: Payer: Medicare Other

## 2019-08-24 ENCOUNTER — Other Ambulatory Visit: Payer: Self-pay | Admitting: *Deleted

## 2019-08-24 ENCOUNTER — Inpatient Hospital Stay
Admission: EM | Admit: 2019-08-24 | Discharge: 2019-08-30 | DRG: 644 | Disposition: A | Discharge status: Home Health | Payer: Medicare Other | Attending: Internal Medicine | Admitting: Internal Medicine

## 2019-08-24 ENCOUNTER — Other Ambulatory Visit: Payer: Self-pay

## 2019-08-24 ENCOUNTER — Telehealth: Payer: Self-pay | Admitting: *Deleted

## 2019-08-24 ENCOUNTER — Inpatient Hospital Stay: Payer: Medicare Other | Admitting: Hospice and Palliative Medicine

## 2019-08-24 ENCOUNTER — Inpatient Hospital Stay: Payer: Medicare Other | Admitting: *Deleted

## 2019-08-24 ENCOUNTER — Encounter: Payer: Self-pay | Admitting: Medical Oncology

## 2019-08-24 ENCOUNTER — Inpatient Hospital Stay: Payer: Medicare Other | Admitting: Occupational Therapy

## 2019-08-24 VITALS — BP 118/70 | HR 103 | Temp 96.3°F | Resp 18

## 2019-08-24 DIAGNOSIS — I10 Essential (primary) hypertension: Secondary | ICD-10-CM | POA: Diagnosis present

## 2019-08-24 DIAGNOSIS — K5900 Constipation, unspecified: Secondary | ICD-10-CM | POA: Diagnosis not present

## 2019-08-24 DIAGNOSIS — C3431 Malignant neoplasm of lower lobe, right bronchus or lung: Secondary | ICD-10-CM | POA: Diagnosis present

## 2019-08-24 DIAGNOSIS — C3490 Malignant neoplasm of unspecified part of unspecified bronchus or lung: Secondary | ICD-10-CM

## 2019-08-24 DIAGNOSIS — N390 Urinary tract infection, site not specified: Secondary | ICD-10-CM | POA: Diagnosis present

## 2019-08-24 DIAGNOSIS — Z87891 Personal history of nicotine dependence: Secondary | ICD-10-CM

## 2019-08-24 DIAGNOSIS — E222 Syndrome of inappropriate secretion of antidiuretic hormone: Secondary | ICD-10-CM | POA: Diagnosis present

## 2019-08-24 DIAGNOSIS — F429 Obsessive-compulsive disorder, unspecified: Secondary | ICD-10-CM | POA: Diagnosis present

## 2019-08-24 DIAGNOSIS — G893 Neoplasm related pain (acute) (chronic): Secondary | ICD-10-CM | POA: Diagnosis present

## 2019-08-24 DIAGNOSIS — E876 Hypokalemia: Secondary | ICD-10-CM | POA: Diagnosis present

## 2019-08-24 DIAGNOSIS — Z515 Encounter for palliative care: Secondary | ICD-10-CM

## 2019-08-24 DIAGNOSIS — Z66 Do not resuscitate: Secondary | ICD-10-CM | POA: Diagnosis present

## 2019-08-24 DIAGNOSIS — Z880 Allergy status to penicillin: Secondary | ICD-10-CM

## 2019-08-24 DIAGNOSIS — F3181 Bipolar II disorder: Secondary | ICD-10-CM | POA: Diagnosis present

## 2019-08-24 DIAGNOSIS — K219 Gastro-esophageal reflux disease without esophagitis: Secondary | ICD-10-CM | POA: Diagnosis present

## 2019-08-24 DIAGNOSIS — R531 Weakness: Secondary | ICD-10-CM | POA: Diagnosis not present

## 2019-08-24 DIAGNOSIS — N3000 Acute cystitis without hematuria: Secondary | ICD-10-CM | POA: Diagnosis not present

## 2019-08-24 DIAGNOSIS — Z881 Allergy status to other antibiotic agents status: Secondary | ICD-10-CM

## 2019-08-24 DIAGNOSIS — Z20822 Contact with and (suspected) exposure to covid-19: Secondary | ICD-10-CM | POA: Diagnosis present

## 2019-08-24 DIAGNOSIS — D72829 Elevated white blood cell count, unspecified: Secondary | ICD-10-CM | POA: Diagnosis present

## 2019-08-24 DIAGNOSIS — D649 Anemia, unspecified: Secondary | ICD-10-CM | POA: Diagnosis present

## 2019-08-24 DIAGNOSIS — I959 Hypotension, unspecified: Secondary | ICD-10-CM | POA: Diagnosis not present

## 2019-08-24 DIAGNOSIS — R0602 Shortness of breath: Secondary | ICD-10-CM

## 2019-08-24 DIAGNOSIS — F4310 Post-traumatic stress disorder, unspecified: Secondary | ICD-10-CM | POA: Diagnosis present

## 2019-08-24 DIAGNOSIS — J449 Chronic obstructive pulmonary disease, unspecified: Secondary | ICD-10-CM | POA: Diagnosis present

## 2019-08-24 DIAGNOSIS — E871 Hypo-osmolality and hyponatremia: Secondary | ICD-10-CM | POA: Diagnosis present

## 2019-08-24 DIAGNOSIS — R29898 Other symptoms and signs involving the musculoskeletal system: Secondary | ICD-10-CM

## 2019-08-24 DIAGNOSIS — Z79899 Other long term (current) drug therapy: Secondary | ICD-10-CM | POA: Diagnosis not present

## 2019-08-24 DIAGNOSIS — D61818 Other pancytopenia: Secondary | ICD-10-CM | POA: Diagnosis present

## 2019-08-24 DIAGNOSIS — Z95828 Presence of other vascular implants and grafts: Secondary | ICD-10-CM

## 2019-08-24 LAB — BASIC METABOLIC PANEL
Anion gap: 11 (ref 5–15)
Anion gap: 12 (ref 5–15)
BUN: 6 mg/dL (ref 6–20)
BUN: 7 mg/dL (ref 6–20)
CO2: 23 mmol/L (ref 22–32)
CO2: 25 mmol/L (ref 22–32)
Calcium: 8.1 mg/dL — ABNORMAL LOW (ref 8.9–10.3)
Calcium: 8.4 mg/dL — ABNORMAL LOW (ref 8.9–10.3)
Chloride: 77 mmol/L — ABNORMAL LOW (ref 98–111)
Chloride: 75 mmol/L — ABNORMAL LOW (ref 98–111)
Creatinine, Ser: 0.34 mg/dL — ABNORMAL LOW (ref 0.44–1.00)
Creatinine, Ser: <0.3 mg/dL — ABNORMAL LOW (ref 0.44–1.00)
GFR calc Af Amer: >60 mL/min (ref >60)
GFR calc non Af Amer: >60 mL/min (ref >60)
Glucose, Bld: 122 mg/dL — ABNORMAL HIGH (ref 70–99)
Glucose, Bld: 125 mg/dL — ABNORMAL HIGH (ref 70–99)
Potassium: 3.3 mmol/L — ABNORMAL LOW (ref 3.5–5.1)
Potassium: 3.1 mmol/L — ABNORMAL LOW (ref 3.5–5.1)
Sodium: 111 mmol/L — CL (ref 135–145)
Sodium: 112 mmol/L — CL (ref 135–145)

## 2019-08-24 LAB — SODIUM
Sodium: 112 mmol/L — CL (ref 135–145)
Sodium: 112 mmol/L — CL (ref 135–145)

## 2019-08-24 LAB — URINALYSIS, COMPLETE (UACMP) WITH MICROSCOPIC
Bilirubin Urine: NEGATIVE
Glucose, UA: NEGATIVE mg/dL
Hgb urine dipstick: NEGATIVE
Ketones, ur: 5 mg/dL — AB
Nitrite: NEGATIVE
Protein, ur: 30 mg/dL — AB
Specific Gravity, Urine: 1.014 (ref 1.005–1.030)
pH: 9 — ABNORMAL HIGH (ref 5.0–8.0)

## 2019-08-24 LAB — CBC WITH DIFFERENTIAL/PLATELET
Abs Immature Granulocytes: 0.4 10*3/uL — ABNORMAL HIGH (ref 0.00–0.07)
Band Neutrophils: 4 %
Basophils Absolute: 0.1 10*3/uL (ref 0.0–0.1)
Basophils Relative: 1 %
Eosinophils Absolute: 0.3 10*3/uL (ref 0.0–0.5)
Eosinophils Relative: 2 %
HCT: 31.7 % — ABNORMAL LOW (ref 36.0–46.0)
Hemoglobin: 11.2 g/dL — ABNORMAL LOW (ref 12.0–15.0)
Lymphocytes Relative: 1 %
Lymphs Abs: 0.1 10*3/uL — ABNORMAL LOW (ref 0.7–4.0)
MCH: 30.1 pg (ref 26.0–34.0)
MCHC: 35.3 g/dL (ref 30.0–36.0)
MCV: 85.2 fL (ref 80.0–100.0)
Metamyelocytes Relative: 1 %
Monocytes Absolute: 0.3 10*3/uL (ref 0.1–1.0)
Monocytes Relative: 2 %
Myelocytes: 2 %
Neutro Abs: 13.5 10*3/uL — ABNORMAL HIGH (ref 1.7–7.7)
Neutrophils Relative %: 87 %
Platelets: 225 10*3/uL (ref 150–400)
RBC: 3.72 MIL/uL — ABNORMAL LOW (ref 3.87–5.11)
RDW: 14.7 % (ref 11.5–15.5)
Smear Review: ADEQUATE
WBC: 14.8 10*3/uL — ABNORMAL HIGH (ref 4.0–10.5)
nRBC: 0 % (ref 0.0–0.2)

## 2019-08-24 LAB — RESPIRATORY PANEL BY RT PCR (FLU A&B, COVID)
Influenza A by PCR: NEGATIVE
Influenza B by PCR: NEGATIVE
SARS Coronavirus 2 by RT PCR: NEGATIVE

## 2019-08-24 LAB — CBC
HCT: 32.2 % — ABNORMAL LOW (ref 36.0–46.0)
Hemoglobin: 11.4 g/dL — ABNORMAL LOW (ref 12.0–15.0)
MCH: 30 pg (ref 26.0–34.0)
MCHC: 35.4 g/dL (ref 30.0–36.0)
MCV: 84.7 fL (ref 80.0–100.0)
Platelets: 211 10*3/uL (ref 150–400)
RBC: 3.8 MIL/uL — ABNORMAL LOW (ref 3.87–5.11)
RDW: 14.6 % (ref 11.5–15.5)
WBC: 15.6 10*3/uL — ABNORMAL HIGH (ref 4.0–10.5)
nRBC: 0 % (ref 0.0–0.2)

## 2019-08-24 LAB — GLUCOSE, CAPILLARY: Glucose-Capillary: 151 mg/dL — ABNORMAL HIGH (ref 70–99)

## 2019-08-24 LAB — OSMOLALITY: Osmolality: 233 mOsm/kg — CL (ref 275–295)

## 2019-08-24 LAB — MRSA PCR SCREENING: MRSA by PCR: POSITIVE — AB

## 2019-08-24 LAB — SARS CORONAVIRUS 2 (TAT 6-24 HRS): SARS Coronavirus 2: NEGATIVE

## 2019-08-24 LAB — HIV ANTIBODY (ROUTINE TESTING W REFLEX): HIV Screen 4th Generation wRfx: NONREACTIVE

## 2019-08-24 LAB — OSMOLALITY, URINE: Osmolality, Ur: 528 mOsm/kg (ref 300–900)

## 2019-08-24 LAB — MAGNESIUM: Magnesium: 2 mg/dL (ref 1.7–2.4)

## 2019-08-24 LAB — POCT PREGNANCY, URINE: Preg Test, Ur: NEGATIVE

## 2019-08-24 LAB — SODIUM, URINE, RANDOM: Sodium, Ur: 92 mmol/L

## 2019-08-24 MED ORDER — AZITHROMYCIN 500 MG PO TABS: 250.0000 mg | ORAL_TABLET | Freq: Every day | ORAL | Status: DC

## 2019-08-24 MED ORDER — SODIUM CHLORIDE 0.9 % IV SOLN
1.0000 g | INTRAVENOUS | Status: DC
  Administered 2019-08-25 – 2019-08-26 (×2): 1 g via INTRAVENOUS
  Filled 2019-08-24 (×2): qty 1

## 2019-08-24 MED ORDER — MONTELUKAST SODIUM 10 MG PO TABS
10.0000 mg | ORAL_TABLET | Freq: Every day | ORAL | Status: DC
  Administered 2019-08-24 – 2019-08-29 (×6): 10 mg via ORAL
  Filled 2019-08-24 (×6): qty 1

## 2019-08-24 MED ORDER — HYDROCODONE-ACETAMINOPHEN 5-325 MG PO TABS
1.0000 | ORAL_TABLET | Freq: Once | ORAL | Status: AC
  Administered 2019-08-24: 1 via ORAL
  Filled 2019-08-24: qty 1

## 2019-08-24 MED ORDER — ADULT MULTIVITAMIN W/MINERALS CH
1.0000 | ORAL_TABLET | Freq: Every day | ORAL | Status: DC
  Administered 2019-08-25 – 2019-08-30 (×6): 1 via ORAL
  Filled 2019-08-24 (×6): qty 1

## 2019-08-24 MED ORDER — ACETAMINOPHEN 325 MG PO TABS: 650.0000 mg | ORAL_TABLET | Freq: Four times a day (QID) | ORAL | Status: DC | PRN

## 2019-08-24 MED ORDER — FLUTICASONE PROPIONATE 50 MCG/ACT NA SUSP
1.0000 | Freq: Every day | NASAL | Status: DC | PRN
  Filled 2019-08-24: qty 16

## 2019-08-24 MED ORDER — CHLORHEXIDINE GLUCONATE CLOTH 2 % EX PADS
6.0000 | MEDICATED_PAD | Freq: Every day | CUTANEOUS | Status: DC
  Administered 2019-08-25 – 2019-08-30 (×5): 6 via TOPICAL

## 2019-08-24 MED ORDER — ONDANSETRON HCL 4 MG PO TABS
4.0000 mg | ORAL_TABLET | Freq: Four times a day (QID) | ORAL | Status: DC | PRN
  Filled 2019-08-24: qty 1

## 2019-08-24 MED ORDER — ACETAMINOPHEN 650 MG RE SUPP: 650.0000 mg | Freq: Four times a day (QID) | RECTAL | Status: DC | PRN

## 2019-08-24 MED ORDER — PANTOPRAZOLE SODIUM 40 MG PO TBEC
40.0000 mg | DELAYED_RELEASE_TABLET | Freq: Every day | ORAL | Status: DC
  Administered 2019-08-25 – 2019-08-30 (×6): 40 mg via ORAL
  Filled 2019-08-24 (×6): qty 1

## 2019-08-24 MED ORDER — ONDANSETRON HCL 4 MG/2ML IJ SOLN
4.0000 mg | Freq: Four times a day (QID) | INTRAMUSCULAR | Status: DC | PRN
  Administered 2019-08-24: 4 mg via INTRAVENOUS
  Filled 2019-08-24: qty 2

## 2019-08-24 MED ORDER — POTASSIUM CHLORIDE CRYS ER 20 MEQ PO TBCR
40.0000 meq | EXTENDED_RELEASE_TABLET | Freq: Once | ORAL | Status: AC
  Administered 2019-08-24: 40 meq via ORAL
  Filled 2019-08-24: qty 2

## 2019-08-24 MED ORDER — DILTIAZEM HCL ER COATED BEADS 120 MG PO CP24
120.0000 mg | ORAL_CAPSULE | Freq: Every day | ORAL | Status: DC
  Administered 2019-08-25 – 2019-08-26 (×2): 120 mg via ORAL
  Filled 2019-08-24 (×4): qty 1

## 2019-08-24 MED ORDER — HYDRALAZINE HCL 25 MG PO TABS: 25.0000 mg | ORAL_TABLET | Freq: Three times a day (TID) | ORAL | Status: DC | PRN

## 2019-08-24 MED ORDER — LORATADINE 10 MG PO TABS: 10.0000 mg | ORAL_TABLET | Freq: Every day | ORAL | Status: DC | PRN

## 2019-08-24 MED ORDER — SODIUM CHLORIDE 3 % IV SOLN
INTRAVENOUS | Status: DC
  Administered 2019-08-24: 30 mL/h via INTRAVENOUS
  Filled 2019-08-24 (×2): qty 500

## 2019-08-24 MED ORDER — SODIUM CHLORIDE 3 % IV SOLN
INTRAVENOUS | Status: DC
  Administered 2019-08-24 – 2019-08-25 (×2): 30 mL/h via INTRAVENOUS
  Filled 2019-08-24 (×2): qty 500

## 2019-08-24 MED ORDER — RISPERIDONE 1 MG PO TABS
1.0000 mg | ORAL_TABLET | Freq: Two times a day (BID) | ORAL | Status: DC
  Administered 2019-08-24 – 2019-08-30 (×12): 1 mg via ORAL
  Filled 2019-08-24 (×15): qty 1

## 2019-08-24 MED ORDER — DM-GUAIFENESIN ER 30-600 MG PO TB12
1.0000 | ORAL_TABLET | Freq: Two times a day (BID) | ORAL | Status: DC
  Administered 2019-08-24 – 2019-08-26 (×5): 1 via ORAL
  Filled 2019-08-24 (×5): qty 1

## 2019-08-24 MED ORDER — IPRATROPIUM BROMIDE 0.02 % IN SOLN
2.5000 mL | Freq: Three times a day (TID) | RESPIRATORY_TRACT | Status: DC
  Administered 2019-08-24 – 2019-08-27 (×8): 0.5 mg via RESPIRATORY_TRACT
  Filled 2019-08-24 (×8): qty 2.5

## 2019-08-24 MED ORDER — MUPIROCIN 2 % EX OINT
TOPICAL_OINTMENT | Freq: Two times a day (BID) | CUTANEOUS | Status: DC
  Administered 2019-08-27: 1 via NASAL
  Filled 2019-08-24 (×2): qty 22

## 2019-08-24 MED ORDER — FLUCONAZOLE 100 MG PO TABS
100.0000 mg | ORAL_TABLET | Freq: Every day | ORAL | Status: DC
  Administered 2019-08-25 – 2019-08-30 (×6): 100 mg via ORAL
  Filled 2019-08-24 (×7): qty 1

## 2019-08-24 MED ORDER — AZITHROMYCIN 500 MG PO TABS: 500.0000 mg | ORAL_TABLET | Freq: Every day | ORAL | Status: DC

## 2019-08-24 MED ORDER — ARFORMOTEROL TARTRATE 15 MCG/2ML IN NEBU
15.0000 ug | INHALATION_SOLUTION | Freq: Two times a day (BID) | RESPIRATORY_TRACT | Status: DC
  Administered 2019-08-24 – 2019-08-27 (×6): 15 ug via RESPIRATORY_TRACT
  Filled 2019-08-24 (×6): qty 2

## 2019-08-24 MED ORDER — DIVALPROEX SODIUM 500 MG PO DR TAB
500.0000 mg | DELAYED_RELEASE_TABLET | Freq: Two times a day (BID) | ORAL | Status: DC
  Administered 2019-08-24 – 2019-08-30 (×12): 500 mg via ORAL
  Filled 2019-08-24 (×16): qty 1

## 2019-08-24 MED ORDER — ENOXAPARIN SODIUM 40 MG/0.4ML ~~LOC~~ SOLN
40.0000 mg | SUBCUTANEOUS | Status: DC
  Administered 2019-08-24 – 2019-08-28 (×5): 40 mg via SUBCUTANEOUS
  Filled 2019-08-24 (×5): qty 0.4

## 2019-08-24 MED ORDER — SODIUM CHLORIDE 0.9% FLUSH
10.0000 mL | Freq: Once | INTRAVENOUS | Status: AC
  Administered 2019-08-24: 10 mL via INTRAVENOUS
  Filled 2019-08-24: qty 10

## 2019-08-24 MED ORDER — CYCLOBENZAPRINE HCL 10 MG PO TABS
10.0000 mg | ORAL_TABLET | Freq: Three times a day (TID) | ORAL | Status: DC
  Administered 2019-08-24 – 2019-08-30 (×16): 10 mg via ORAL
  Filled 2019-08-24 (×22): qty 1

## 2019-08-24 MED ORDER — HYDROCODONE-ACETAMINOPHEN 5-325 MG PO TABS
1.0000 | ORAL_TABLET | Freq: Four times a day (QID) | ORAL | Status: DC | PRN
  Administered 2019-08-24 – 2019-08-27 (×9): 1 via ORAL
  Filled 2019-08-24 (×9): qty 1

## 2019-08-24 MED ORDER — SODIUM CHLORIDE 0.9 % IV SOLN: 1.0000 g | INTRAVENOUS | Status: DC

## 2019-08-24 MED ORDER — ALBUTEROL SULFATE (2.5 MG/3ML) 0.083% IN NEBU: 2.5000 mg | INHALATION_SOLUTION | RESPIRATORY_TRACT | Status: DC | PRN

## 2019-08-24 NOTE — ED Notes (Signed)
MD at bedside. 

## 2019-08-24 NOTE — H&P (Signed)
History and Physical    Charlene [name] BHA:[id number] DOB: Nov 01, 1960 DOA: 08/24/2019  Referring MD/NP/PA:   PCP: System, Pcp Not In   Patient coming from:  The patient is coming from home.  At baseline, pt is independent for most of ADL.        Chief Complaint: Abnormal lab  HPI: Charlene [name] is a 59 y.o. 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  Pt was in cancer center for labs drawn before plan to have chemotherapy today. She was found to have hyponatremia with sodium of 111.  Patient states that she has generalized weakness and dizziness.  No confusion or seizure.  She has chronic cough and mild shortness breath, no chest pain, fever or chills.  She had diarrhea in the past few days, which has resolved.  Currently no nausea, vomiting, diarrhea or abdominal pain.  Patient has mild dysuria in the past several days.   ED Course: pt was found to have sodium 112, potassium 3.1, positive urinalysis for UTI, renal function okay, negative Covid PCR, WBC 15.6, temperature 98.4, tachycardia, oxygen saturation 97% on room air, RR 20, blood pressure 139/90.  Patient is admitted to stepdown as inpatient.  Renal, Dr. Holley Raring is consulted.  Review of Systems:   General: no fevers, chills, no body weight gain, has poor appetite, has fatigue HEENT: no blurry vision, hearing changes or sore throat Respiratory: has dyspnea, coughing, wheezing CV: no chest pain, no palpitations GI: currently no nausea, vomiting, abdominal pain, diarrhea, constipation GU: has dysuria, no burning on urination, increased urinary frequency, hematuria  Ext: no leg edema Neuro: no unilateral weakness, numbness, or tingling, no vision change or hearing loss Skin: no rash, no skin tear. MSK: No muscle spasm, no deformity, no limitation of range of movement in spin Heme: No easy bruising.    Travel history: No recent long distant travel.  Allergy:  Allergies  Allergen Reactions  . Amoxicillin-Pot Clavulanate Hives    Also vomiting  . Sulfamethoxazole-Trimethoprim Hives and Nausea And Vomiting    Past Medical History:  Diagnosis Date  . Asthma   . Bipolar 2 disorder ([place])   . Borderline schizophrenia ([place])   . COPD (chronic obstructive pulmonary disease) ([place])   . Falls infrequently   . GERD (gastroesophageal reflux disease)   . Hypertension   . Port-A-Cath in place   . Primary cancer of right lower lobe of lung ([place]) 07/12/2015   Small cell undifferentiated carcinoma of lung.  Diagnosis at [facility] by fine-needle aspiration of lymph node (January, 2017)  . Ulcer     Past Surgical History:  Procedure Laterality Date  . ABDOMINAL HYSTERECTOMY    . APPENDECTOMY    . borderline schizophrenia    . chronic back pain    . HIP PINNING,CANNULATED Right 12/08/2017   Procedure: CANNULATED HIP PINNING;  Surgeon: Thornton Park, MD;  Location: [facility] ORS;  Service: Orthopedics;  Laterality: Right;  . NASAL SINUS SURGERY     x2  . PERIPHERAL VASCULAR CATHETERIZATION N/A 07/30/2015   Procedure: Glori Luis Cath Insertion;  Surgeon: Algernon Huxley, MD;  Location: [facility];  Service: Cardiovascular;  Laterality: N/A;  . STOMACH SURGERY      Social History:  reports that she quit smoking about 4 years ago. She has a 15.00 pack-year smoking history. She has never used smokeless tobacco. She reports current  drug use. Drug: Cocaine. She reports that she does not drink alcohol.  Family History:  Family History  Problem Relation Age of Onset  . Hypertension Father      Prior to Admission medications   Medication Sig Start Date End Date Taking? Authorizing Provider  albuterol (PROVENTIL HFA;VENTOLIN HFA) 108 (90 Base) MCG/ACT inhaler Inhale 2 puffs into the lungs every 4 (four) hours as needed for wheezing or shortness of breath.    [provider]  ATROVENT  HFA 17 MCG/ACT inhaler Inhale 1 puff into the lungs daily. 09/14/18   [provider]  cyclobenzaprine (FLEXERIL) 10 MG tablet Take 10 mg by mouth 2 (two) times daily.  06/26/15   [provider]  diltiazem (CARDIZEM CD) 120 MG 24 hr capsule Take 1 capsule (120 mg total) by mouth daily. 01/11/16   Max Sane, MD  divalproex (DEPAKOTE) 500 MG DR tablet Take 2 tablets (1,000 mg total) by mouth every 12 (twelve) hours. 01/11/16   Max Sane, MD  fluconazole (DIFLUCAN) 100 MG tablet Take 1 tablet (100 mg total) by mouth daily. 08/22/19   Cammie Sickle, MD  fluticasone (FLONASE) 50 MCG/ACT nasal spray 1 spray by Each Nare route daily. 07/03/15 08/23/66  [provider]  HYDROcodone-acetaminophen (NORCO/VICODIN) 5-325 MG tablet Take 1 tablet by mouth every 12 (twelve) hours as needed for moderate pain. 08/19/19   Cammie Sickle, MD  lidocaine-prilocaine (EMLA) cream Apply 1 application topically as needed. 08/17/19   Cammie Sickle, MD  loratadine (CLARITIN) 10 MG tablet Take 10 mg by mouth daily as needed.     [provider]  montelukast (SINGULAIR) 10 MG tablet TAKE 1 TABLET BY MOUTH AT BEDTIME 07/22/19   Cammie Sickle, MD  Multiple Vitamin (MULTIVITAMIN) tablet Take 1 tablet by mouth daily.    [provider]  omeprazole (PRILOSEC) 20 MG capsule Take 20 mg by mouth daily.    [provider]  ondansetron (ZOFRAN) 8 MG tablet Take 1 tablet (8 mg total) by mouth every 8 (eight) hours as needed for nausea or vomiting. 08/17/19   Cammie Sickle, MD  prochlorperazine (COMPAZINE) 10 MG tablet Take 1 tablet (10 mg total) by mouth every 6 (six) hours as needed for nausea or vomiting. 08/17/19   Cammie Sickle, MD  risperiDONE (RISPERDAL) 1 MG tablet Take 1 tablet (1 mg total) by mouth at bedtime. Patient taking differently: Take 1 mg by mouth 2 (two) times daily.  11/24/16   Bettey Costa, MD  salmeterol (SEREVENT) 50 MCG/DOSE diskus  inhaler Inhale 1 puff into the lungs 2 (two) times daily. 07/20/17 08/16/24  [provider]  traMADol (ULTRAM) 50 MG tablet Take 2 tablets (100 mg total) by mouth every 6 (six) hours as needed. Patient not taking: Reported on 08/22/2019 08/08/19   Cammie Sickle, MD    Physical Exam: Vitals:   08/24/19 1400 08/24/19 1445 08/24/19 1700 08/24/19 1806  BP: 125/84  120/86 134/74  Pulse: 98 95 97 100  Resp: 16 13 14 17   Temp:    (!) 97.2 F (36.2 C)  TempSrc:    Axillary  SpO2: 96% 96% 97% 98%  Weight:    93.9 kg  Height:    5\' 8"  (1.727 m)   General: Not in acute distress HEENT:       Eyes: PERRL, EOMI, no scleral icterus.       ENT: No discharge from the ears and nose, no pharynx injection, no tonsillar  enlargement.        Neck: No JVD, no bruit, no mass felt. Heme: No neck lymph node enlargement. Cardiac: S1/S2, RRR, No murmurs, No gallops or rubs. Respiratory: Has mild wheezing bilaterally GI: Soft, nondistended, nontender, no rebound pain, no organomegaly, BS present. GU: No hematuria Ext: No pitting leg edema bilaterally. 2+DP/PT pulse bilaterally. Musculoskeletal: No joint deformities, No joint redness or warmth, no limitation of ROM in spin. Skin: No rashes.  Neuro: Alert, oriented X3, cranial nerves II-XII grossly intact, moves all extremities normally.  Psych: Patient is not psychotic, no suicidal or hemocidal ideation.  Labs on Admission: I have personally reviewed following labs and imaging studies  CBC: Recent Labs  Lab 08/24/19 0936 08/24/19 1044  WBC 14.8* 15.6*  NEUTROABS 13.5*  --   HGB 11.2* 11.4*  HCT 31.7* 32.2*  MCV 85.2 84.7  PLT 225 161   Basic Metabolic Panel: Recent Labs  Lab 08/24/19 0936 08/24/19 1044 08/24/19 1445 08/24/19 1700  NA 111* 112* 112* 112*  K 3.3* 3.1*  --   --   CL 77* 75*  --   --   CO2 23 25  --   --   GLUCOSE 122* 125*  --   --   BUN 6 7  --   --   CREATININE 0.34* <0.30*  --   --   CALCIUM 8.1* 8.4*  --    --   MG  --   --  2.0  --    GFR: CrCl cannot be calculated (This lab value cannot be used to calculate CrCl because it is not a number: <0.30). Liver Function Tests: No results for input(s): AST, ALT, ALKPHOS, BILITOT, PROT, ALBUMIN in the last 168 hours. No results for input(s): LIPASE, AMYLASE in the last 168 hours. No results for input(s): AMMONIA in the last 168 hours. Coagulation Profile: No results for input(s): INR, PROTIME in the last 168 hours. Cardiac Enzymes: No results for input(s): CKTOTAL, CKMB, CKMBINDEX, TROPONINI in the last 168 hours. BNP (last 3 results) No results for input(s): PROBNP in the last 8760 hours. HbA1C: No results for input(s): HGBA1C in the last 72 hours. CBG: Recent Labs  Lab 08/24/19 1807  GLUCAP 151*   Lipid Profile: No results for input(s): CHOL, HDL, LDLCALC, TRIG, CHOLHDL, LDLDIRECT in the last 72 hours. Thyroid Function Tests: No results for input(s): TSH, T4TOTAL, FREET4, T3FREE, THYROIDAB in the last 72 hours. Anemia Panel: No results for input(s): VITAMINB12, FOLATE, FERRITIN, TIBC, IRON, RETICCTPCT in the last 72 hours. Urine analysis:    Component Value Date/Time   COLORURINE AMBER (A) 08/24/2019 1337   APPEARANCEUR CLOUDY (A) 08/24/2019 1337   LABSPEC 1.014 08/24/2019 1337   PHURINE 9.0 (H) 08/24/2019 1337   GLUCOSEU NEGATIVE 08/24/2019 1337   HGBUR NEGATIVE 08/24/2019 1337   BILIRUBINUR NEGATIVE 08/24/2019 1337   KETONESUR 5 (A) 08/24/2019 1337   PROTEINUR 30 (A) 08/24/2019 1337   NITRITE NEGATIVE 08/24/2019 1337   LEUKOCYTESUR TRACE (A) 08/24/2019 1337   Sepsis Labs: @LABRCNTIP (procalcitonin:4,lacticidven:4) ) Recent Results (from the past 240 hour(s))  Respiratory Panel by RT PCR (Flu A&B, Covid) - Nasopharyngeal Swab     Status: None   Collection Time: 08/24/19  4:13 PM   Specimen: Nasopharyngeal Swab  Result Value Ref Range Status   SARS Coronavirus 2 by RT PCR NEGATIVE NEGATIVE Final    Comment:  (NOTE) SARS-CoV-2 target nucleic acids are NOT DETECTED. The SARS-CoV-2 RNA is generally detectable in upper respiratoy specimens during  the acute phase of infection. The lowest concentration of SARS-CoV-2 viral copies this assay can detect is 131 copies/mL. A negative result does not preclude SARS-Cov-2 infection and should not be used as the sole basis for treatment or other patient management decisions. A negative result may occur with  improper specimen collection/handling, submission of specimen other than nasopharyngeal swab, presence of viral mutation(s) within the areas targeted by this assay, and inadequate number of viral copies (<131 copies/mL). A negative result must be combined with clinical observations, patient history, and epidemiological information. The expected result is Negative. Fact Sheet for Patients:  PinkCheek.be Fact Sheet for Healthcare Providers:  GravelBags.it This test is not yet ap proved or cleared by the [place] FDA and  has been authorized for detection and/or diagnosis of SARS-CoV-2 by FDA under an Emergency Use Authorization (EUA). This EUA will remain  in effect (meaning this test can be used) for the duration of the COVID-19 declaration under Section 564(b)(1) of the Act, 21 U.S.C. section 360bbb-3(b)(1), unless the authorization is terminated or revoked sooner.    Influenza A by PCR NEGATIVE NEGATIVE Final   Influenza B by PCR NEGATIVE NEGATIVE Final    Comment: (NOTE) The Xpert Xpress SARS-CoV-2/FLU/RSV assay is intended as an aid in  the diagnosis of influenza from Nasopharyngeal swab specimens and  should not be used as a sole basis for treatment. Nasal washings and  aspirates are unacceptable for Xpert Xpress SARS-CoV-2/FLU/RSV  testing. Fact Sheet for Patients: PinkCheek.be Fact Sheet for Healthcare  Providers: GravelBags.it This test is not yet approved or cleared by the [place] FDA and  has been authorized for detection and/or diagnosis of SARS-CoV-2 by  FDA under an Emergency Use Authorization (EUA). This EUA will remain  in effect (meaning this test can be used) for the duration of the  Covid-19 declaration under Section 564(b)(1) of the Act, 21  U.S.C. section 360bbb-3(b)(1), unless the authorization is  terminated or revoked. Performed at [facility], [address], [place], [place] [address]      Radiological Exams on Admission: No results found.   EKG: Independently reviewed.  447 QTC, sinus rhythm, early R wave progression  Assessment/Plan Principal Problem:   Hyponatremia Active Problems:   Chronic obstructive pulmonary disease (HCC)   Essential (primary) hypertension   Primary cancer of right lower lobe of lung (HCC)   Anemia   GERD (gastroesophageal reflux disease)   Hypokalemia   Leukocytosis   UTI (urinary tract infection)  Hyponatremia: Differential diagnosis include poor oral intake, SIADH, thyroid dysfunction -will admit to SDU as inpt - Hypertonic saline 3% at 30 cc/h as recommended by renal, Dr. Holley Raring - Will check urine sodium, urine osmolality, serum osmolality. 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 - Repleted - Check Mg  level  Leukocytosis: May be due to UTI -Follow-up of blood culture  UTI (urinary tract infection): Patient has positive urinalysis and dysuria -rocephin -f/u blood culture and urine culture  Inpatient status:  # Patient requires inpatient status due to high intensity of service, high risk for further deterioration and high frequency of surveillance required.  I certify that at the point of admission it is my clinical judgment that the patient will require inpatient hospital care spanning beyond 2 midnights from the point of admission.  . 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 . Now patient has presenting with hyponatremia and UTI . The initial radiographic and laboratory data are worrisome because of severe hyponatremia with sodium 112 and positive urinalysis for UTI . Current medical needs: please see my assessment and plan . Predictability of an adverse outcome (risk): Patient has multiple comorbidities as listed above. Now presents with severe hyponatremia and UTI. Patient's presentation is highly complicated.  Patient is at high risk of deteriorating.  Will need to be treated in hospital for at least 2 days.              DVT ppx:  SQ Lovenox Code Status: Full code Family Communication: not done, no family member is at bed side.   Disposition Plan:  Anticipate discharge back to previous home environment Consults called:  Renal, Dr. Holley Raring is consulted. Admission status: DU/inpation       Date of Service 08/24/2019    Ivor Costa [facility]   If 7PM-7AM, please contact night-coverage [email or web address] 08/24/2019, 6:45 PM

## 2019-08-24 NOTE — Consult Note (Signed)
[facility] for Sodium Monitoring   Recent Labs: Potassium (mmol/L)  Date Value  08/24/2019 3.1 (L)   Magnesium (mg/dL)  Date Value  08/24/2019 2.0   Calcium (mg/dL)  Date Value  08/24/2019 8.4 (L)   Albumin (g/dL)  Date Value  08/15/2019 3.3 (L)   Sodium (mmol/L)  Date Value  08/24/2019 112 (LL)     Assessment: Pharmacy has been consulted to monitor sodium levels and follow up with provider. ED physician order Hypertonic saline 3% @ 30 cc/hr. Confirmed with ED nurse patient does have a central line.   Na 111 > 112  Goal: Increase Na by 4-6 mEq in 4-6 hours.   Plan:  Contact pharmacy/provider if Na increases > 4 mEq/ in 2 hours or > 6 mEq/L in 4 hours. If Na rises by > 6 mEq in 6 ours, stop 3% NaCl.   Will monitor Na Q2H x2 occurrences, then Q4H thereafter.  Update:Hypertonic saline given at 1342. Na@ 1445 112 (~ 1 hr after the start of the infusion). Pharmacy will continue to monitor.   Rowland Lathe ,PharmD Clinical Pharmacist 08/24/2019 12:49 PM

## 2019-08-24 NOTE — ED Provider Notes (Signed)
[facility] Emergency Department Provider Note   ____________________________________________   First MD Initiated Contact with Patient 08/24/19 1253     (approximate)  I have reviewed the triage vital signs and the nursing notes.   HISTORY  Chief Complaint Weakness and Abnormal Lab    HPI Charlene [name] is a 59 y.o. female  here for evaluation of low sodium  Patient reports that  she had labs drawn before plan to have chemotherapy today and they referred her here due to low sodium  Patient reports she has been feeling fatigue generally weak for several days now, no confusion but feeling a bit late she describes as lightheaded feeling specially with walking.  Also feeling like her muscles feel very weak  No seizures.  Currently undergoing treatment for lung cancer  Poor appetite, frequent fatigue.  No pain.  Mild shortness of breath which is days been present for a long time related to her cancer no new changes  Denies Covid exposure.  None Past Medical History:  Diagnosis Date  . Asthma   . Bipolar 2 disorder ([place])   . Borderline schizophrenia ([place])   . COPD (chronic obstructive pulmonary disease) ([place])   . Falls infrequently   . GERD (gastroesophageal reflux disease)   . Hypertension   . Port-A-Cath in place   . Primary cancer of right lower lobe of lung ([place]) 07/12/2015   Small cell undifferentiated carcinoma of lung.  Diagnosis at [facility] by fine-needle aspiration of lymph node (January, 2017)  . Ulcer     Patient Active Problem List   Diagnosis Date Noted  . GERD (gastroesophageal reflux disease) 08/24/2019  . Hyponatremia 08/24/2019  . Hypokalemia 08/24/2019  . Leukocytosis 08/24/2019  . Goals of care, counseling/discussion 08/08/2019  . Hip fx ([place]) 12/07/2017  . Abdominal distension 08/28/2017  . Respiratory failure ([place])   . Lethargy   . COPD exacerbation ([place]) 11/16/2016  . HCAP (healthcare-associated pneumonia)  12/31/2015  . Acute encephalopathy 12/31/2015  . Herpes zoster 12/31/2015  . Anemia 12/31/2015  . Acute psychosis associated with endocrine, metabolic, or cerebrovascular disorder 12/21/2015  . Cocaine use disorder, moderate, dependence ([place]) 12/21/2015  . Bipolar I disorder, most recent episode manic, severe with psychotic features ([place]) 12/21/2015  . OCD (obsessive compulsive disorder) 12/21/2015  . PTSD (post-traumatic stress disorder) 12/21/2015  . Abnormal cells of cervix 07/30/2015  . Encounter for general adult medical examination without abnormal findings 07/28/2015  . Primary cancer of right lower lobe of lung ([place]) 07/12/2015  . At risk for noncompliance 06/29/2015  . Spasm 06/26/2015  . Essential (primary) hypertension 06/21/2015  . Lung mass 06/21/2015  . Candida infection of mouth 06/21/2015  . Episodic mood disorder ([place]) 10/14/2014  . Chronic obstructive pulmonary disease ([place]) 10/06/2014  . Pulmonary emphysema ([place]) 10/06/2014  . LBP (low back pain) 09/27/2014  . Tobacco use disorder 09/27/2014    Past Surgical History:  Procedure Laterality Date  . ABDOMINAL HYSTERECTOMY    . APPENDECTOMY    . borderline schizophrenia    . chronic back pain    . HIP PINNING,CANNULATED Right 12/08/2017   Procedure: CANNULATED HIP PINNING;  Surgeon: Thornton Park, MD;  Location: [facility] ORS;  Service: Orthopedics;  Laterality: Right;  . NASAL SINUS SURGERY     x2  . PERIPHERAL VASCULAR CATHETERIZATION N/A 07/30/2015   Procedure: Glori Luis Cath Insertion;  Surgeon: Algernon Huxley, MD;  Location: [place] [facility];  Service: Cardiovascular;  Laterality: N/A;  .  STOMACH SURGERY      Prior to Admission medications   Medication Sig Start Date End Date Taking? Authorizing Provider  albuterol (PROVENTIL HFA;VENTOLIN HFA) 108 (90 Base) MCG/ACT inhaler Inhale 2 puffs into the lungs every 4 (four) hours as needed for wheezing or shortness of breath.    [provider]  ATROVENT HFA  17 MCG/ACT inhaler Inhale 1 puff into the lungs daily. 09/14/18   [provider]  cyclobenzaprine (FLEXERIL) 10 MG tablet Take 10 mg by mouth 2 (two) times daily.  06/26/15   [provider]  diltiazem (CARDIZEM CD) 120 MG 24 hr capsule Take 1 capsule (120 mg total) by mouth daily. 01/11/16   Max Sane, MD  divalproex (DEPAKOTE) 500 MG DR tablet Take 2 tablets (1,000 mg total) by mouth every 12 (twelve) hours. 01/11/16   Max Sane, MD  fluconazole (DIFLUCAN) 100 MG tablet Take 1 tablet (100 mg total) by mouth daily. 08/22/19   Cammie Sickle, MD  fluticasone (FLONASE) 50 MCG/ACT nasal spray 1 spray by Each Nare route daily. 07/03/15 08/23/66  [provider]  HYDROcodone-acetaminophen (NORCO/VICODIN) 5-325 MG tablet Take 1 tablet by mouth every 12 (twelve) hours as needed for moderate pain. 08/19/19   Cammie Sickle, MD  lidocaine-prilocaine (EMLA) cream Apply 1 application topically as needed. 08/17/19   Cammie Sickle, MD  loratadine (CLARITIN) 10 MG tablet Take 10 mg by mouth daily as needed.     [provider]  montelukast (SINGULAIR) 10 MG tablet TAKE 1 TABLET BY MOUTH AT BEDTIME 07/22/19   Cammie Sickle, MD  Multiple Vitamin (MULTIVITAMIN) tablet Take 1 tablet by mouth daily.    [provider]  omeprazole (PRILOSEC) 20 MG capsule Take 20 mg by mouth daily.    [provider]  ondansetron (ZOFRAN) 8 MG tablet Take 1 tablet (8 mg total) by mouth every 8 (eight) hours as needed for nausea or vomiting. 08/17/19   Cammie Sickle, MD  prochlorperazine (COMPAZINE) 10 MG tablet Take 1 tablet (10 mg total) by mouth every 6 (six) hours as needed for nausea or vomiting. 08/17/19   Cammie Sickle, MD  risperiDONE (RISPERDAL) 1 MG tablet Take 1 tablet (1 mg total) by mouth at bedtime. Patient taking differently: Take 1 mg by mouth 2 (two) times daily.  11/24/16   Bettey Costa, MD  salmeterol (SEREVENT) 50 MCG/DOSE diskus  inhaler Inhale 1 puff into the lungs 2 (two) times daily. 07/20/17 08/16/24  [provider]  traMADol (ULTRAM) 50 MG tablet Take 2 tablets (100 mg total) by mouth every 6 (six) hours as needed. Patient not taking: Reported on 08/22/2019 08/08/19   Cammie Sickle, MD    Allergies Amoxicillin-pot clavulanate and Sulfamethoxazole-trimethoprim  Family History  Problem Relation Age of Onset  . Hypertension Father     Social History Social History   Tobacco Use  . Smoking status: Former Smoker    Packs/day: 0.50    Years: 30.00    Pack years: 15.00    Quit date: 06/17/2015    Years since quitting: 4.1  . Smokeless tobacco: Never Used  Substance Use Topics  . Alcohol use: No    Alcohol/week: 0.0 standard drinks  . Drug use: Yes    Types: Cocaine    Comment: States due to cocaine in mouthwash    Review of Systems Constitutional: No fever/chills feeling very fatigued Eyes: No visual changes. ENT: No sore throat. Cardiovascular: Denies chest pain.  Right upper  chest port Respiratory: Denies shortness of breath. Gastrointestinal: No abdominal pain.   Genitourinary: Negative for dysuria. Musculoskeletal: Negative for back pain. Skin: Negative for rash. Neurological: Negative for headaches, areas of focal weakness or numbness.  Feels generally weak and fatigued her muscles.    ____________________________________________   PHYSICAL EXAM:  VITAL SIGNS: ED Triage Vitals  Enc Vitals Group     BP 08/24/19 1041 (!) 149/82     Pulse Rate 08/24/19 1041 98     Resp 08/24/19 1041 20     Temp 08/24/19 1041 98.4 F (36.9 C)     Temp Source 08/24/19 1041 Oral     SpO2 08/24/19 1041 98 %     Weight 08/24/19 1042 205 lb 0.4 oz (93 kg)     Height 08/24/19 1042 5\' 8"  (1.727 m)     Head Circumference --      Peak Flow --      Pain Score 08/24/19 1042 7     Pain Loc --      Pain Edu? --      Excl. in [place]? --     Constitutional: Alert and oriented.  Chronically  ill-appearing, but pleasant and in no distress Eyes: Conjunctivae are normal. Head: Atraumatic. Nose: No congestion/rhinnorhea. Mouth/Throat: Mucous membranes are moist. Neck: No stridor.  Cardiovascular: Normal rate, regular rhythm. Grossly normal heart sounds.  Good peripheral circulation.  Right upper chest Port-A-Cath site clean dry intact Respiratory: Normal respiratory effort.  No retractions. Lungs CTAB. Gastrointestinal: Soft and nontender. No distention. Musculoskeletal: No lower extremity tenderness nor edema. Neurologic:  Normal speech and language. No gross focal neurologic deficits are appreciated.  Skin:  Skin is warm, dry and intact. No rash noted. Psychiatric: Mood and affect are normal. Speech and behavior are normal.  ____________________________________________   LABS (all labs ordered are listed, but only abnormal results are displayed)  Labs Reviewed  BASIC METABOLIC PANEL - Abnormal; Notable for the following components:      Result Value   Sodium 112 (*)    Potassium 3.1 (*)    Chloride 75 (*)    Glucose, Bld 125 (*)    Creatinine, Ser <0.30 (*)    Calcium 8.4 (*)    All other components within normal limits  CBC - Abnormal; Notable for the following components:   WBC 15.6 (*)    RBC 3.80 (*)    Hemoglobin 11.4 (*)    HCT 32.2 (*)    All other components within normal limits  SARS CORONAVIRUS 2 (TAT 6-24 HRS)  URINALYSIS, COMPLETE (UACMP) WITH MICROSCOPIC  SODIUM  OSMOLALITY  OSMOLALITY, URINE  SODIUM, URINE, RANDOM  SODIUM  SODIUM  MAGNESIUM  POC URINE PREG, ED   ____________________________________________  EKG  Reviewed inter by me at 1245 Heart rate 95 QRS 100 QTc 450 Normal sinus rhythm, no evidence of acute ischemia or ectopy denoted. ____________________________________________  RADIOLOGY  No results found.  ____________________________________________   PROCEDURES  Procedure(s) performed: None  Procedures  Critical  Care performed: Yes, see critical care note(s)  CRITICAL CARE Performed by: Delman Kitten   Total critical care time: 25 minutes  Critical care time was exclusive of separately billable procedures and treating other patients.  Critical care was necessary to treat or prevent imminent or life-threatening deterioration.  Critical care was time spent personally by me on the following activities: development of treatment plan with patient and/or surrogate as well as nursing, discussions with consultants, evaluation of patient's response to treatment, examination  of patient, obtaining history from patient or surrogate, ordering and performing treatments and interventions, ordering and review of laboratory studies, ordering and review of radiographic studies, pulse oximetry and re-evaluation of patient's condition.  ____________________________________________   INITIAL IMPRESSION / ASSESSMENT AND PLAN / ED COURSE  Pertinent labs & imaging results that were available during my care of the patient were reviewed by me and considered in my medical decision making (see chart for details).   Patient presents for concerns of generalized fatigue associated with hyponatremia.  She denies other acute symptoms except she does report feeling lightheaded at times, generalized muscle weakness but no focal neurologic deficits.  She does have a history of lung cancer, this may be contributing.  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 are all considered.  I discussed her case with Dr. Holley Raring of nephrology, he recommends that he will see her in consult today and start her on slow infusion of 30 mL/h of 3% saline.  Clinical Course as of Aug 23 1312  Wed Aug 24, 2019  1153 We will check back on patient, currently with nurse usually toilet   [MQ]    Clinical Course User Index [MQ] Delman Kitten, MD   [name] was evaluated in Emergency Department on 08/24/2019 for the symptoms described in the history of present illness. She was evaluated in the context of the global COVID-19 pandemic, which necessitated consideration that the patient might be at risk for infection with the SARS-CoV-2 virus that causes COVID-19. Institutional protocols and algorithms that pertain to the evaluation of patients at risk for COVID-19 are in a state of rapid change based on information released by regulatory bodies including the CDC and federal and state organizations. These policies and algorithms were followed during the patient's care in the ED.  Patient agreeable understand plan for admission.  Admission discussed with Dr. Blaine Hamper   ____________________________________________   FINAL CLINICAL IMPRESSION(S) / ED DIAGNOSES  Final diagnoses:  Hyponatremia        Note:  This document was prepared using Dragon voice recognition software and may include unintentional dictation errors       Delman Kitten, MD 08/24/19 1314

## 2019-08-24 NOTE — Telephone Encounter (Signed)
Spoke with front ER triage nurse. Patient has a critical sodium level of 111. H/o lung cancer and hyponatremia per Dr. 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  Patient stable in vitals. She was transported to ER via wheelchair by [place], Therapist, sports.

## 2019-08-24 NOTE — Consult Note (Signed)
[place] for Sodium Monitoring   Recent Labs: Potassium (mmol/L)  Date Value  08/24/2019 3.1 (L)   Magnesium (mg/dL)  Date Value  08/24/2019 2.0   Calcium (mg/dL)  Date Value  08/24/2019 8.4 (L)   Albumin (g/dL)  Date Value  08/15/2019 3.3 (L)   Sodium (mmol/L)  Date Value  08/24/2019 112 (LL)     Assessment: Pharmacy has been consulted to monitor sodium levels and follow up with provider. ED physician order Hypertonic saline 3% @ 30 cc/hr. Confirmed with ED nurse patient does have a central line.   Na 111 > 112  Goal: Increase Na by 4-6 mEq in 4-6 hours.   Plan:  Contact pharmacy/provider if Na increases > 4 mEq/ in 2 hours or > 6 mEq/L in 4 hours. If Na rises by > 6 mEq in 6 ours, stop 3% NaCl.   Will monitor Na Q2H x2 occurrences, then Q4H thereafter.  Update:Hypertonic saline given at 1342. Na@ 1445 112 (~ 1 hr after the start of the infusion). Pharmacy will continue to monitor.   Update:Hypertonic saline given at 1342. Na@ 1700 112 (infusion has been running for ~ 3 hours). Per chart review, nurse notified MD of level and hypertonic saline 3% @ 50 cc/hr was ordered by attending. D/w attending about resuming 30 cc/hr at this time. Pharmacy will continue to monitor.    Rowland Lathe ,PharmD Clinical Pharmacist 08/24/2019 12:49 PM

## 2019-08-24 NOTE — Therapy (Signed)
[facility] [address] [place], [place], [place], [address] Phone: [phone]   Fax:  [phone]  Occupational Therapy cancellation note   Patient Details  Name: Charlene [name] MRN: [id number] Date of Birth: [birth year] No data recorded     Past Medical History:  Diagnosis Date  . Asthma   . Bipolar 2 disorder ([place])   . Borderline schizophrenia ([place])   . COPD (chronic obstructive pulmonary disease) ([place])   . Falls infrequently   . GERD (gastroesophageal reflux disease)   . Hypertension   . Port-A-Cath in place   . Primary cancer of right lower lobe of lung ([place]) 07/12/2015   Small cell undifferentiated carcinoma of lung.  Diagnosis at [facility] by fine-needle aspiration of lymph node (January, 2017)  . Ulcer     Past Surgical History:  Procedure Laterality Date  . ABDOMINAL HYSTERECTOMY    . APPENDECTOMY    . borderline schizophrenia    . chronic back pain    . HIP PINNING,CANNULATED Right 12/08/2017   Procedure: CANNULATED HIP PINNING;  Surgeon: Thornton Park, MD;  Location: [facility] ORS;  Service: Orthopedics;  Laterality: Right;  . NASAL SINUS SURGERY     x2  . PERIPHERAL VASCULAR CATHETERIZATION N/A 07/30/2015   Procedure: Glori Luis Cath Insertion;  Surgeon: Algernon Huxley, MD;  Location: [place] CV LAB;  Service: Cardiovascular;  Laterality: N/A;  . STOMACH SURGERY                      Pt was refer by Palliative care for rehab/OT screen    Patient arrive this date and seen by [facility] as add-on to his clinic schedule today due to progressive weakness.  Labs were checked and revealed a critical sodium of 111.  Patient was sent to the ER and will need to be admitted to the hospital for continued IV fluids with plan for slow correction of hyponatremia.  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  Patient was having diarrhea but this has  resolved with use of Imodium.  Oral intake remains poor.  Overall, she is not doing well.  Patient is in agreement with hospitalization Pt not seen by OT this date - please contact me if OT/Rehab can be off help                          Visit Diagnosis: Weakness of both lower extremities    Problem List Patient Active Problem List   Diagnosis Date Noted  . Goals of care, counseling/discussion 08/08/2019  . Hip fx ([place]) 12/07/2017  . Abdominal distension 08/28/2017  . Respiratory failure ([place])   . Lethargy   . COPD exacerbation ([place]) 11/16/2016  . HCAP (healthcare-associated pneumonia) 12/31/2015  . Acute encephalopathy 12/31/2015  . Herpes zoster 12/31/2015  . Anemia 12/31/2015  . Acute psychosis associated with endocrine, metabolic, or cerebrovascular disorder 12/21/2015  . Cocaine use disorder, moderate, dependence ([place]) 12/21/2015  . Bipolar I disorder, most recent episode manic, severe with psychotic features ([place]) 12/21/2015  . OCD (obsessive compulsive disorder) 12/21/2015  . PTSD (post-traumatic stress disorder) 12/21/2015  . Abnormal cells of cervix 07/30/2015  . Encounter for general adult medical examination without abnormal findings 07/28/2015  . Primary cancer of right lower lobe of lung ([place]) 07/12/2015  . At risk for noncompliance 06/29/2015  . Spasm 06/26/2015  . Essential (  primary) hypertension 06/21/2015  . Lung mass 06/21/2015  . Candida infection of mouth 06/21/2015  . Episodic mood disorder ([place]) 10/14/2014  . Chronic obstructive pulmonary disease ([place]) 10/06/2014  . Pulmonary emphysema ([place]) 10/06/2014  . LBP (low back pain) 09/27/2014  . Tobacco use disorder 09/27/2014    Rosalyn Gess OTR/L, CLT 08/24/2019, 11:30 AM  [facility] [address] [place], [place], [place], [address] Phone: [phone]   Fax:  [phone]  Name: Charlene [name] MRN: [id number] Date  of Birth: [birth year]

## 2019-08-24 NOTE — ED Notes (Signed)
Date and time results received: 08/24/19 3:49 PM  (use smartphrase ".now" to insert current time)  Test: Sodium Critical Value: 112  Name of Provider Notified: Dr. Blaine Hamper  Orders Received? Or Actions Taken?: No new orders at this time

## 2019-08-24 NOTE — ED Notes (Signed)
Date and time results received: 08/24/19 5:27 PM  (use smartphrase ".now" to insert current time)  Test: Sodium Critical Value: 112  Name of Provider Notified: Dr. [name]  Orders Received? Or Actions Taken?: No new orders at this time

## 2019-08-24 NOTE — Consult Note (Addendum)
[place] for Sodium Monitoring   Recent Labs: Potassium (mmol/L)  Date Value  08/24/2019 3.1 (L)   Magnesium (mg/dL)  Date Value  11/01/2015 2.2   Calcium (mg/dL)  Date Value  08/24/2019 8.4 (L)   Albumin (g/dL)  Date Value  08/15/2019 3.3 (L)   Sodium (mmol/L)  Date Value  08/24/2019 112 (LL)     Assessment: Pharmacy has been consulted to monitor sodium levels and follow up with provider. ED physician order Hypertonic saline 3% @ 30 cc/hr. Confirmed with ED nurse patient does have a central line.   Na 111 > 112  Goal: Increase Na by 4-6 mEq in 4-6 hours.   Plan:  Contact pharmacy/provider if Na increases > 4 mEq/ in 2 hours or > 6 mEq/L in 4 hours. If Na rises by > 6 mEq in 6 ours, stop 3% NaCl.   Will monitor Na Q2H x2 occurrences, then Q4H thereafter.  Rowland Lathe ,PharmD Clinical Pharmacist 08/24/2019 12:49 PM

## 2019-08-24 NOTE — ED Notes (Signed)
Date and time results received: 08/24/19 4:59 PM  (use smartphrase ".now" to insert current time)  Test: Osmolality  Critical Value: 233  Name of Provider Notified: Dr. [name]  Orders Received? Or Actions Taken?: No new orders at this time

## 2019-08-24 NOTE — ED Notes (Signed)
Pt encouraged by this RN to provide urine sample when she feels the need to urinate.

## 2019-08-24 NOTE — ED Triage Notes (Signed)
Pt sent over from cancer center- pt went for IV fluids. States that she has been feeling weak and dizzy for a few days. Labs drawn at Cancer center revealed a NA+ of 111. Pt being treated for lung and liver cancer- last chemo treatment was Friday.

## 2019-08-24 NOTE — ED Notes (Signed)
Date and time results received: 08/24/19 12:27 PM  (use smartphrase ".now" to insert current time)  Test: Sodium Critical Value: 112  Name of Provider Notified: Dr. Jacqualine Code   Orders Received? Or Actions Taken?: No new orders at this time

## 2019-08-24 NOTE — ED Notes (Signed)
Pt assisted to the bathroom by this RN. This RN provided assistance due to pt being unsteady and dizzy.

## 2019-08-24 NOTE — Progress Notes (Signed)
[facility]  [phone] [phone]   Name: Charlene [name] Date: 08/24/2019 MRN: [id number]  DOB: 1960/12/29  Patient Care Team: System, Pcp Not In as PCP - General    REASON FOR CONSULTATION: Charlene [name] is a 59 y.o. 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  Status post carbo/etoposide/Tecentriq (last treated on 08/17/2019).  Patient has had diarrhea secondary to treatment.  She has had progressive weakness.  Patient was referred to palliative care to help address goals and manage ongoing symptoms.  SOCIAL HISTORY:     reports that she quit smoking about 4 years ago. She has a 15.00 pack-year smoking history. She has never used smokeless tobacco. She reports current drug use. Drug: Cocaine. She reports that she does not drink alcohol.   Patient is unmarried.  She lives at home with her parents and brother.  She has a daughter who lives nearby.  Patient has been disabled many years but previously worked as a Educational psychologist.  ADVANCE DIRECTIVES:  Does not have  CODE STATUS: DNR/DNI (DNR form completed on 08/24/2019)  PAST MEDICAL HISTORY: Past Medical History:  Diagnosis Date  . Asthma   . Bipolar 2 disorder ([place])   . Borderline schizophrenia ([place])   . COPD (chronic obstructive pulmonary disease) ([place])   . Falls infrequently   . GERD (gastroesophageal reflux disease)   . Hypertension   . Port-A-Cath in place   . Primary cancer of right lower lobe of lung ([place]) 07/12/2015   Small cell undifferentiated carcinoma of lung.  Diagnosis at [facility] by fine-needle aspiration of lymph node (January, 2017)  . Ulcer     PAST SURGICAL HISTORY:  Past Surgical History:  Procedure Laterality Date  . ABDOMINAL HYSTERECTOMY    . APPENDECTOMY    . borderline schizophrenia    .  chronic back pain    . HIP PINNING,CANNULATED Right 12/08/2017   Procedure: CANNULATED HIP PINNING;  Surgeon: Thornton Park, MD;  Location: [facility] ORS;  Service: Orthopedics;  Laterality: Right;  . NASAL SINUS SURGERY     x2  . PERIPHERAL VASCULAR CATHETERIZATION N/A 07/30/2015   Procedure: Glori Luis Cath Insertion;  Surgeon: Algernon Huxley, MD;  Location: [facility];  Service: Cardiovascular;  Laterality: N/A;  . STOMACH SURGERY      HEMATOLOGY/ONCOLOGY HISTORY:  Oncology History Overview Note   cancer of right lower lobe of lung (small cell undifferentiated tumor) diagnosis on July 05 2015) at [facility] by bronchoscopy.  Needle aspiration of lymph node was positive for small cell carcinoma of lung. Clinically  Staged  As  T1 N2 M0 tumor.     2.PET scan shows localized disease. MRI of brain is negative for any metastases   3.  Chemotherapy with cis-platinum and VP-16 has been started on July 18, 2015 4.starting radiation therapy to the chest from August 23 2015 .   5.Last chemotherapy (fourth cycle) October 03, 2015 patient has finished radiation therapy.  # AUG 2017- CT C/A/P- NED- stable RML- scarring;   # FEB 2021- RECURRENCE; liver bx- pending; MARCH 3rd 2021- CARBO-ETOP-TECEN  ---------------------------------------------------------------    DIAGNOSIS:  SMALL CELL LUNG CA  STAGE: IV ; GOALS: PALLIATIVE  CURRENT/MOST RECENT THERAPY - CARBO-ETOP-TECENTRIQ [C]    Primary cancer of right lower lobe of lung ([place])  07/12/2015 Initial Diagnosis   Primary  cancer of right lower lobe of lung ([place])   08/17/2019 -  Chemotherapy   The patient had palonosetron (ALOXI) injection 0.25 mg, 0.25 mg, Intravenous,  Once, 1 of 4 cycles Administration: 0.25 mg (08/17/2019) pegfilgrastim-jmdb (FULPHILA) injection 6 mg, 6 mg, Subcutaneous,  Once, 1 of 4 cycles Administration: 6 mg (08/22/2019) CARBOplatin (PARAPLATIN) 690 mg in sodium chloride 0.9 % 250 mL chemo infusion, 690 mg (100 %  of original dose 693 mg), Intravenous,  Once, 1 of 4 cycles Dose modification:   (original dose 693 mg, Cycle 1) Administration: 690 mg (08/17/2019) etoposide (VEPESID) 210 mg in sodium chloride 0.9 % 1,000 mL chemo infusion, 100 mg/m2 = 210 mg, Intravenous,  Once, 1 of 4 cycles Administration: 210 mg (08/17/2019), 210 mg (08/18/2019), 210 mg (08/19/2019) fosaprepitant (EMEND) 150 mg in sodium chloride 0.9 % 145 mL IVPB, 150 mg, Intravenous,  Once, 1 of 4 cycles Administration: 150 mg (08/17/2019) atezolizumab (TECENTRIQ) 1,200 mg in sodium chloride 0.9 % 250 mL chemo infusion, 1,200 mg, Intravenous, Once, 1 of 8 cycles Administration: 1,200 mg (08/17/2019)  for chemotherapy treatment.      ALLERGIES:  is allergic to amoxicillin-pot clavulanate and sulfamethoxazole-trimethoprim.  MEDICATIONS:  Current Outpatient Medications  Medication Sig Dispense Refill  . albuterol (PROVENTIL HFA;VENTOLIN HFA) 108 (90 Base) MCG/ACT inhaler Inhale 2 puffs into the lungs every 4 (four) hours as needed for wheezing or shortness of breath.    . ATROVENT HFA 17 MCG/ACT inhaler Inhale 1 puff into the lungs daily.    . cyclobenzaprine (FLEXERIL) 10 MG tablet Take 10 mg by mouth 2 (two) times daily.     Marland Kitchen diltiazem (CARDIZEM CD) 120 MG 24 hr capsule Take 1 capsule (120 mg total) by mouth daily. 30 capsule 0  . divalproex (DEPAKOTE) 500 MG DR tablet Take 2 tablets (1,000 mg total) by mouth every 12 (twelve) hours. 60 tablet 0  . fluconazole (DIFLUCAN) 100 MG tablet Take 1 tablet (100 mg total) by mouth daily. 7 tablet 0  . fluticasone (FLONASE) 50 MCG/ACT nasal spray 1 spray by Each Nare route daily.    Marland Kitchen HYDROcodone-acetaminophen (NORCO/VICODIN) 5-325 MG tablet Take 1 tablet by mouth every 12 (twelve) hours as needed for moderate pain. 45 tablet 0  . lidocaine-prilocaine (EMLA) cream Apply 1 application topically as needed. 30 g 3  . loratadine (CLARITIN) 10 MG tablet Take 10 mg by mouth daily as needed.     . montelukast  (SINGULAIR) 10 MG tablet TAKE 1 TABLET BY MOUTH AT BEDTIME 60 tablet 1  . Multiple Vitamin (MULTIVITAMIN) tablet Take 1 tablet by mouth daily.    Marland Kitchen omeprazole (PRILOSEC) 20 MG capsule Take 20 mg by mouth daily.    . ondansetron (ZOFRAN) 8 MG tablet Take 1 tablet (8 mg total) by mouth every 8 (eight) hours as needed for nausea or vomiting. 20 tablet 3  . prochlorperazine (COMPAZINE) 10 MG tablet Take 1 tablet (10 mg total) by mouth every 6 (six) hours as needed for nausea or vomiting. 30 tablet 3  . risperiDONE (RISPERDAL) 1 MG tablet Take 1 tablet (1 mg total) by mouth at bedtime. (Patient taking differently: Take 1 mg by mouth 2 (two) times daily. ) 30 tablet 0  . salmeterol (SEREVENT) 50 MCG/DOSE diskus inhaler Inhale 1 puff into the lungs 2 (two) times daily.    . traMADol (ULTRAM) 50 MG tablet Take 2 tablets (100 mg total) by mouth every 6 (six) hours as needed. (Patient not taking: Reported on 08/22/2019) 60  tablet 0   No current facility-administered medications for this visit.    VITAL SIGNS: BP 118/70 (BP Location: Left Arm, Patient Position: Sitting)   Pulse (!) 103   Temp (!) 96.3 F (35.7 C) (Tympanic)   Resp 18   SpO2 97%  Filed Weights    Estimated body mass index is 31.17 kg/m as calculated from the following:   Height as of an earlier encounter on 08/24/19: 5\' 8"  (1.727 m).   Weight as of an earlier encounter on 08/24/19: 205 lb 0.4 oz (93 kg).  LABS: CBC:    Component Value Date/Time   WBC 14.8 (H) 08/24/2019 0936   HGB 11.2 (L) 08/24/2019 0936   HCT 31.7 (L) 08/24/2019 0936   PLT 225 08/24/2019 0936   MCV 85.2 08/24/2019 0936   NEUTROABS 13.5 (H) 08/24/2019 0936   LYMPHSABS 0.1 (L) 08/24/2019 0936   MONOABS 0.3 08/24/2019 0936   EOSABS 0.3 08/24/2019 0936   BASOSABS 0.1 08/24/2019 0936   Comprehensive Metabolic Panel:    Component Value Date/Time   NA 111 (LL) 08/24/2019 0936   K 3.3 (L) 08/24/2019 0936   CL 77 (L) 08/24/2019 0936   CO2 23 08/24/2019 0936     BUN 6 08/24/2019 0936   CREATININE 0.34 (L) 08/24/2019 0936   GLUCOSE 122 (H) 08/24/2019 0936   CALCIUM 8.1 (L) 08/24/2019 0936   AST 23 08/15/2019 0739   ALT 10 08/15/2019 0739   ALKPHOS 80 08/15/2019 0739   BILITOT 0.5 08/15/2019 0739   PROT 6.2 (L) 08/15/2019 0739   ALBUMIN 3.3 (L) 08/15/2019 0739    RADIOGRAPHIC STUDIES: CT Chest W Contrast  Result Date: 08/05/2019 CLINICAL DATA:  Lung cancer restaging. EXAM: CT CHEST WITH CONTRAST TECHNIQUE: Multidetector CT imaging of the chest was performed during intravenous contrast administration. CONTRAST:  28mL OMNIPAQUE IOHEXOL 300 MG/ML  SOLN COMPARISON:  06/30/2018 FINDINGS: Cardiovascular: Normal heart size.  No pericardial effusion. Mediastinum/Nodes: Normal appearance of the thyroid gland. The trachea appears patent and is midline. Normal appearance of the esophagus. No axillary or supraclavicular adenopathy. Right paratracheal lymph node measures 1.2 cm, image 49/2. This is new from previous exam. New 1.1 cm low right paratracheal lymph node, image 59/2. Lungs/Pleura: No pleural effusion. Centrilobular and paraseptal emphysema with diffuse bronchial wall thickening. Right upper lobe and right middle lobe cavitary lung mass is identified within the distribution of previously noted radiation change. On today's study this measures 8.5 x 3.7 cm, image 67/3. This invades the right hilum with narrowing of the distal right mainstem bronchus as well as the right upper lobe lobar and segmental airways. Similar appearance of radiation change involving the superior segment of right lower lobe, image 81/3 Upper Abdomen: No acute abnormality. Musculoskeletal: No chest wall abnormality. No acute or significant osseous findings. IMPRESSION: 1. Imaging findings compatible with recurrence of disease involving the right upper lobe, right middle lobe. Tumor involves the right hilum and narrows the right upper lobe airway. 2. Interval enlargement of right  paratracheal lymph nodes concerning for metastatic adenopathy. Aortic Atherosclerosis (ICD10-I70.0) and Emphysema (ICD10-J43.9). Electronically Signed   By: Kerby Moors M.D.   On: 08/05/2019 13:02   MR Brain W Wo Contrast  Result Date: 08/22/2019 CLINICAL DATA:  Worsening dizziness, metastatic lung cancer EXAM: MRI HEAD WITHOUT AND WITH CONTRAST TECHNIQUE: Multiplanar, multiecho pulse sequences of the brain and surrounding structures were obtained without and with intravenous contrast. CONTRAST:  36mL GADAVIST GADOBUTROL 1 MMOL/ML IV SOLN COMPARISON:  2017 FINDINGS: Brain:  There is no acute infarction or intracranial hemorrhage. There is no intracranial mass, mass effect, or edema. There is no hydrocephalus or extra-axial fluid collection. Confluent areas of T2 hyperintensity in the supratentorial white matter are nonspecific and probably reflect therapy related changes given significant progression since 2017. No abnormal enhancement. Vascular: Major vessel flow voids at the skull base are preserved. Skull and upper cervical spine: Normal marrow signal is preserved. Sinuses/Orbits: Partial opacification of the maxillary sinuses. There is evidence of chronic right maxillary sinusitis. Orbits are unremarkable. Orbits are unremarkable. Other: Sella is unremarkable.  Mastoid air cells are clear. IMPRESSION: No evidence of intracranial metastatic disease. No acute infarction or hemorrhage. Significant progression of white matter disease since 2017 likely reflecting sequelae of therapy. Electronically Signed   By: Macy Mis M.D.   On: 08/22/2019 14:03   NM PET Image Restag (PS) Skull Base To Thigh  Result Date: 08/11/2019 CLINICAL DATA:  Subsequent treatment strategy for small cell lung cancer. EXAM: NUCLEAR MEDICINE PET SKULL BASE TO THIGH TECHNIQUE: 10.85 mCi F-18 FDG was injected intravenously. Full-ring PET imaging was performed from the skull base to thigh after the radiotracer. CT data was obtained  and used for attenuation correction and anatomic localization. Fasting blood glucose: 82 mg/dl COMPARISON:  Chest CT 08/05/2019.  Remote PET-CT 07/16/2015 FINDINGS: Mediastinal blood pool activity: SUV max 2.32 Liver activity: SUV max NA NECK: No hypermetabolic lymph nodes in the neck. Incidental CT findings: none CHEST: 6.5 x 5.5 cm cavitary mass in the right upper lobe invading the right hilum is hypermetabolic with SUV max of 09.47 consistent with recurrent tumor. Enlarged right paratracheal lymph nodes are also hypermetabolic and consistent with metastatic adenopathy. 8 mm node on image 72/3 has an SUV max of 4.99 and slightly more inferiorly there is a 12 mm node on image 78/3 which has an SUV max of 7.77. No supraclavicular or axillary adenopathy. No breast masses. Areas of hypermetabolism in the supraclavicular fossas bilaterally without associated CT correlate most consistent with hypermetabolic brown fat. No obvious pulmonary nodules to suggest pulmonary metastatic disease. Incidental CT findings: none ABDOMEN/PELVIS: Hypermetabolic lesion in the left hepatic lobe is suspicious for a new hepatic metastatic lesion. This appears to correlate with a vague low-attenuation lesion measuring 14 mm on image 126/3. No other definite hepatic lesions. No adrenal gland lesions are identified. The pancreas, spleen and both kidneys are unremarkable. No abdominal or pelvic lymphadenopathy. Incidental CT findings: none SKELETON: No focal hypermetabolic activity to suggest skeletal metastasis. Incidental CT findings: none IMPRESSION: 1. Recurrent right upper lobe/right hilar tumor and associated adjacent metastatic mediastinal adenopathy. 2. Suspect new left hepatic lobe metastatic lesion. 3. No definite metastatic pulmonary nodules. 4. No findings for osseous metastatic disease. Electronically Signed   By: Marijo Sanes M.D.   On: 08/11/2019 15:15   [place] CORE BIOPSY (LIVER)  Result Date: 08/15/2019 CLINICAL DATA:  Right  lower lobe lung small-cell carcinoma. Hypermetabolic liver lesion on recent PET-CT. EXAM: ULTRASOUND-GUIDED CORE LIVER BIOPSY TECHNIQUE: An ultrasound guided liver biopsy was thoroughly discussed with the patient and questions were answered. The benefits, risks, alternatives, and complications were also discussed. The patient understands and wishes to proceed with the procedure. A verbal as well as written consent was obtained. Survey ultrasound of the liver was performed. At least 3 low-attenuation lesions measuring up to 1.5 cm were identified, in both lobes. The left lesion was inaccessible due to overlying costal cartilage. Therefore, a right anterior lesion was approached. An appropriate skin entry  site was determined. Skin site was marked, prepped with chlorhexidine , and draped in usual sterile fashion, and infiltrated locally with 1% lidocaine. Intravenous Fentanyl 15mcg and Versed 2mg  were administered as conscious sedation during continuous monitoring of the patient's level of consciousness and physiological / cardiorespiratory status by the radiology RN, with a total moderate sedation time of 35 minutes. A 17 gauge trocar needle was advanced under ultrasound guidance into the liver. 4 coaxial 18gauge core samples were then obtained through the guide needle. The guide needle was removed. Post procedure scans demonstrate no apparent complication. COMPLICATIONS: COMPLICATIONS None immediate FINDINGS: At least 3 hypoechoic liver lesions were identified. Representative core biopsy samples obtained as above. IMPRESSION: 1. Technically successful ultrasound guided core liver lesion biopsy. Electronically Signed   By: Lucrezia Europe M.D.   On: 08/15/2019 12:08    PERFORMANCE STATUS (ECOG) : 3  Review of Systems Unless otherwise noted, a complete review of systems is negative.  Physical Exam General: NAD Cards: Regular rate and rhythm Pulmonary: Expiratory wheezing Abdomen: Soft, nontender to  palp Extremities: no edema, no joint deformities Skin: no rashes Neurological: Weakness but otherwise nonfocal  IMPRESSION: Patient was an add-on to my clinic schedule today due to progressive weakness.  Labs were checked and revealed a critical sodium of 111.  Patient is being sent to the ER and will need to be admitted to the hospital for continued IV fluids with plan for slow correction of hyponatremia.  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  Patient was having diarrhea but this has resolved with use of Imodium.  Oral intake remains poor.  Overall, she is not doing well.  Patient is in agreement with hospitalization.  We discussed CODE STATUS.  Patient says that she has decided she would not want to be resuscitated or have her life prolonged artificially on machines.  She would be interested in establishing ACP documents and appointing her father to be her healthcare power of attorney.  I will ask the chaplain to assist with this while she is in the hospital.  At patient's request, I called and spoke with her father.  I updated him.  He is in agreement with hospitalization.  Father states that patient has been declining and he feels she is reaching a point where care will become difficult to manage at home.  He is 59 years old and he says he no longer has the stamina.  He is also the caregiver for his wife and cared for his son prior to his son's death from cancer.   Patient does have both Medicare and Medicaid, which would provide options for resources either at home or in an SNF.  At home, could consider PACE program or CAPS/PCS. Hospice could also be considered and was discussed with father if patient reaches a point where she is no longer a candidate for treatment. SNF could be considered if family cannot care for her at home. Hopefully, she may regain some strength with correction of her hyponatremia.   PLAN: -Patient has been sent to the ER  and will need hospitalization due to hyponatremia -Will benefit from inpatient referrals to oncology and palliative care -Will benefit from inpatient referral to chaplain to help address advanced care planning documents -DNR/DNI  Case and plan discussed with Dr. Rogue Bussing  Patient expressed understanding and was in agreement with this plan. She also understands that She can call the clinic at any time with any questions, concerns,  or complaints.     Time Total: 20 minutes  Visit consisted of counseling and education dealing with the complex and emotionally intense issues of symptom management and palliative care in the setting of serious and potentially life-threatening illness.Greater than 50%  of this time was spent counseling and coordinating care related to the above assessment and plan.  Signed by: Altha Harm, PhD, NP-C

## 2019-08-25 ENCOUNTER — Encounter: Payer: Self-pay | Admitting: Internal Medicine

## 2019-08-25 LAB — CBC
HCT: 27.5 % — ABNORMAL LOW (ref 36.0–46.0)
Hemoglobin: 9.9 g/dL — ABNORMAL LOW (ref 12.0–15.0)
MCH: 30.7 pg (ref 26.0–34.0)
MCHC: 36 g/dL (ref 30.0–36.0)
MCV: 85.1 fL (ref 80.0–100.0)
Platelets: 166 10*3/uL (ref 150–400)
RBC: 3.23 MIL/uL — ABNORMAL LOW (ref 3.87–5.11)
RDW: 14.5 % (ref 11.5–15.5)
WBC: 5.3 10*3/uL (ref 4.0–10.5)
nRBC: 0 % (ref 0.0–0.2)

## 2019-08-25 LAB — BASIC METABOLIC PANEL
Anion gap: 7 (ref 5–15)
BUN: 5 mg/dL — ABNORMAL LOW (ref 6–20)
CO2: 27 mmol/L (ref 22–32)
Calcium: 7.7 mg/dL — ABNORMAL LOW (ref 8.9–10.3)
Chloride: 86 mmol/L — ABNORMAL LOW (ref 98–111)
Creatinine, Ser: 0.34 mg/dL — ABNORMAL LOW (ref 0.44–1.00)
GFR calc Af Amer: >60 mL/min (ref >60)
GFR calc non Af Amer: >60 mL/min (ref >60)
Glucose, Bld: 104 mg/dL — ABNORMAL HIGH (ref 70–99)
Potassium: 3 mmol/L — ABNORMAL LOW (ref 3.5–5.1)
Sodium: 120 mmol/L — ABNORMAL LOW (ref 135–145)

## 2019-08-25 LAB — SODIUM: Sodium: 122 mmol/L — ABNORMAL LOW (ref 135–145)

## 2019-08-25 MED ORDER — SODIUM CHLORIDE 3 % IV SOLN
INTRAVENOUS | Status: DC
  Filled 2019-08-25: qty 500

## 2019-08-25 MED ORDER — SODIUM CHLORIDE 3 % IV SOLN
INTRAVENOUS | Status: DC
  Administered 2019-08-26: 02:00:00 10 mL/h via INTRAVENOUS
  Filled 2019-08-25 (×3): qty 500

## 2019-08-25 MED ORDER — BENZONATATE 100 MG PO CAPS
100.0000 mg | ORAL_CAPSULE | Freq: Three times a day (TID) | ORAL | Status: DC
  Administered 2019-08-25 – 2019-08-30 (×14): 100 mg via ORAL
  Filled 2019-08-25 (×15): qty 1

## 2019-08-25 MED ORDER — GUAIFENESIN 100 MG/5ML PO SOLN
5.0000 mL | ORAL | Status: DC | PRN
  Filled 2019-08-25: qty 5

## 2019-08-25 MED ORDER — GUAIFENESIN-CODEINE 100-10 MG/5ML PO SOLN
10.0000 mL | ORAL | Status: DC | PRN
  Administered 2019-08-25 – 2019-08-27 (×4): 10 mL via ORAL
  Filled 2019-08-25 (×5): qty 10

## 2019-08-25 MED ORDER — ALPRAZOLAM 0.5 MG PO TABS
0.5000 mg | ORAL_TABLET | Freq: Two times a day (BID) | ORAL | Status: DC | PRN
  Administered 2019-08-25 (×2): 0.5 mg via ORAL
  Filled 2019-08-25 (×3): qty 1

## 2019-08-25 MED ORDER — POTASSIUM CHLORIDE CRYS ER 20 MEQ PO TBCR
40.0000 meq | EXTENDED_RELEASE_TABLET | ORAL | Status: AC
  Administered 2019-08-25 (×2): 40 meq via ORAL
  Filled 2019-08-25 (×2): qty 2

## 2019-08-25 NOTE — TOC Initial Note (Signed)
Transition of Care [facility]) - Initial/Assessment Note    Patient Details  Name: Charlene [name] MRN: [id number] Date of Birth: 10-26-1960  Transition of Care [facility]) CM/SW Contact:    Magnus Ivan, LCSW Phone Number: 08/25/2019, 1:44 PM  Clinical Narrative:                CSW met with patient at bedside. Explained CSW role. Patient reported she lives with her parents and brother. Patient's father provides transport to appointments. Current PCP is Dr. Gwenlyn Saran in [place]. Patient reported she is considering looking for a PCP closer to home. CSW will submit request for Nurse Secretary to make PCP appointment within 5-7 days of discharge. Patient uses [facility] and denied issues with obtaining her medications. Patient reported she has a walker and a wheelchair at home. Patient went to [facility] in the past for rehab. She reported she has also used [facility] RN services in the past. Patient was encouraged to reach out with any needs. CSW will continue to follow.    Expected Discharge Plan: Home/Self Care Barriers to Discharge: Continued Medical Work up   Patient Goals and CMS Choice        Expected Discharge Plan and Services Expected Discharge Plan: Home/Self Care       Living arrangements for the past 2 months: Single Family Home                                      Prior Living Arrangements/Services Living arrangements for the past 2 months: Single Family Home Lives with:: Parents, Siblings Patient language and need for interpreter reviewed:: Yes Do you feel safe going back to the place where you live?: Yes      Need for Family Participation in Patient Care: Yes (Comment) Care giver support system in place?: Yes (comment) Current home services: DME Criminal Activity/Legal Involvement Pertinent to Current Situation/Hospitalization: No - Comment as needed  Activities of Daily Living Home Assistive Devices/Equipment: Walker (specify  type) ADL Screening (condition at time of admission) Patient's cognitive ability adequate to safely complete daily activities?: Yes Is the patient deaf or have difficulty hearing?: No Does the patient have difficulty seeing, even when wearing glasses/contacts?: No Does the patient have difficulty concentrating, remembering, or making decisions?: No Patient able to express need for assistance with ADLs?: No Does the patient have difficulty dressing or bathing?: No Independently performs ADLs?: Yes (appropriate for developmental age) Does the patient have difficulty walking or climbing stairs?: No Weakness of Legs: None Weakness of Arms/Hands: None  Permission Sought/Granted                  Emotional Assessment Appearance:: Appears stated age Attitude/Demeanor/Rapport: Engaged Affect (typically observed): Calm Orientation: : Oriented to Self, Oriented to Place, Oriented to Situation, Oriented to  Time Alcohol / Substance Use: Not Applicable Psych Involvement: No (comment)  Admission diagnosis:  Hyponatremia [E87.1] Patient Active Problem List   Diagnosis Date Noted  . GERD (gastroesophageal reflux disease) 08/24/2019  . Hyponatremia 08/24/2019  . Hypokalemia 08/24/2019  . Leukocytosis 08/24/2019  . UTI (urinary tract infection) 08/24/2019  . Goals of care, counseling/discussion 08/08/2019  . Hip fx ([place]) 12/07/2017  . Abdominal distension 08/28/2017  . Respiratory failure ([place])   . Lethargy   . COPD exacerbation ([place]) 11/16/2016  . HCAP (healthcare-associated pneumonia) 12/31/2015  . Acute encephalopathy 12/31/2015  .  Herpes zoster 12/31/2015  . Anemia 12/31/2015  . Acute psychosis associated with endocrine, metabolic, or cerebrovascular disorder 12/21/2015  . Cocaine use disorder, moderate, dependence ([place]) 12/21/2015  . Bipolar I disorder, most recent episode manic, severe with psychotic features ([place]) 12/21/2015  . OCD (obsessive compulsive disorder) 12/21/2015  .  PTSD (post-traumatic stress disorder) 12/21/2015  . Abnormal cells of cervix 07/30/2015  . Encounter for general adult medical examination without abnormal findings 07/28/2015  . Primary cancer of right lower lobe of lung ([place]) 07/12/2015  . At risk for noncompliance 06/29/2015  . Spasm 06/26/2015  . Essential (primary) hypertension 06/21/2015  . Lung mass 06/21/2015  . Candida infection of mouth 06/21/2015  . Episodic mood disorder ([place]) 10/14/2014  . Chronic obstructive pulmonary disease ([place]) 10/06/2014  . Pulmonary emphysema ([place]) 10/06/2014  . LBP (low back pain) 09/27/2014  . Tobacco use disorder 09/27/2014   PCP:  System, Pcp Not In Pharmacy:   [place], [place] [address] Phone: [phone] Fax: [phone]     Social Determinants of Health (SDOH) Interventions    Readmission Risk Interventions Readmission Risk Prevention Plan 08/25/2019  Transportation Screening Complete  PCP or Specialist Appt within 3-5 Days Complete  HRI or Home Care Consult Complete  Medication Review (RN Care Manager) Complete  Some recent data might be hidden

## 2019-08-25 NOTE — Consult Note (Addendum)
[place] for Sodium Monitoring   Recent Labs: Potassium (mmol/L)  Date Value  08/25/2019 3.0 (L)   Magnesium (mg/dL)  Date Value  08/24/2019 2.0   Calcium (mg/dL)  Date Value  08/25/2019 7.7 (L)   Albumin (g/dL)  Date Value  08/15/2019 3.3 (L)   Sodium (mmol/L)  Date Value  08/25/2019 122 (L)     Assessment: Pharmacy has been consulted to monitor sodium levels and follow up with provider. Hypertonic saline held this am and is now ordered to resume.   Goal: Increase Na by 4-6 mEq in 4-6 hours.   Plan:  Hypertonic saline resumed at 1230 at 11mL/hr.   3/10:  Na @ 0511 = 120           Na @ 1837 = 122  Next Sodium level scheduled for 2300 and every 4 hours thereafter.   Pharmacy will continue to monitor per consult.   Kadajah Kjos D 08/24/2019 12:49 PM

## 2019-08-25 NOTE — Consult Note (Addendum)
[place] for Sodium Monitoring   Recent Labs: Potassium (mmol/L)  Date Value  08/25/2019 3.0 (L)   Magnesium (mg/dL)  Date Value  08/24/2019 2.0   Calcium (mg/dL)  Date Value  08/25/2019 7.7 (L)   Albumin (g/dL)  Date Value  08/15/2019 3.3 (L)   Sodium (mmol/L)  Date Value  08/25/2019 120 (L)     Assessment: Pharmacy has been consulted to monitor sodium levels and follow up with provider. Hypertonic saline held this am and is now ordered to resume.   Goal: Increase Na by 4-6 mEq in 4-6 hours.   Plan:  Hypertonic saline resumed at 1230 at 19mL/hr.   Next Sodium level scheduled for 1600 and every 4 hours thereafter.   Pharmacy will continue to monitor per consult.   Currie Paris 08/24/2019 12:49 PM

## 2019-08-25 NOTE — Consult Note (Signed)
[place] for Sodium Monitoring   Recent Labs: Potassium (mmol/L)  Date Value  08/25/2019 3.0 (L)   Magnesium (mg/dL)  Date Value  08/24/2019 2.0   Calcium (mg/dL)  Date Value  08/25/2019 7.7 (L)   Albumin (g/dL)  Date Value  08/15/2019 3.3 (L)   Sodium (mmol/L)  Date Value  08/25/2019 120 (L)     Assessment: Pharmacy has been consulted to monitor sodium levels and follow up with provider. ED physician order Hypertonic saline 3% @ 30 cc/hr. Confirmed with ED nurse patient does have a central line.   Na 111 > 112  Goal: Increase Na by 4-6 mEq in 4-6 hours.   Plan:  03/11 @ 0600 per lab sodium q4h orders were not visible/active, reordered the sodium q4h labs and 0600 Na has come back 120 mEq/L which is up by 9 mEq/L in the past 20 hours. Spoke to NP to recommend decreasing rate to 15 ml/hr. Waiting to hear back.  Tobie Lords, PharmD, BCPS Clinical Pharmacist 08/24/2019 12:49 PM

## 2019-08-25 NOTE — Consult Note (Signed)
[facility] CONSULT NOTE    Date: 08/25/2019                  Patient Name:  SOPHIRA RUMLER  MRN: [id number]  DOB: Jan 02, 1961  Age / Sex: 59 y.o., female         PCP: System, Pcp Not In                 Service Requesting Consult:  Hospitalist                 Reason for Consult:  Hyponatremia            History of Present Illness: Patient is a 59 y.o. 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  Patient is actively followed in the cancer center.  Upon initial evaluation in the cancer center her serum sodium was found to be quite low at 111.  Therefore was recommended that she present to [facility] for admission.  We recommended that she was started on 3% saline.  With 3% saline serum sodium now up to 120.  Earlier this a.m. therefore 3% saline was stopped.  Patient reports that she has started to feel a bit better.  Still having some weakness.   Medications: Outpatient medications: Medications Prior to Admission  Medication Sig Dispense Refill Last Dose  . albuterol (PROVENTIL HFA;VENTOLIN HFA) 108 (90 Base) MCG/ACT inhaler Inhale 2 puffs into the lungs every 4 (four) hours as needed for wheezing or shortness of breath.     . ATROVENT HFA 17 MCG/ACT inhaler Inhale 1 puff into the lungs daily.     . cyclobenzaprine (FLEXERIL) 10 MG tablet Take 10 mg by mouth 3 (three) times daily.    08/23/2019 at 2200  . diltiazem (CARDIZEM CD) 120 MG 24 hr capsule Take 1 capsule (120 mg total) by mouth daily. 30 capsule 0 08/23/2019 at Unknown time  . divalproex (DEPAKOTE) 500 MG DR tablet Take 2 tablets (1,000 mg total) by mouth every 12 (twelve) hours. (Patient taking differently: Take 500 mg by mouth every 12 (twelve) hours. ) 60 tablet 0 08/23/2019 at 2200  . fluconazole  (DIFLUCAN) 100 MG tablet Take 1 tablet (100 mg total) by mouth daily. 7 tablet 0 08/23/2019 at 0800  . fluticasone (FLONASE) 50 MCG/ACT nasal spray daily as needed.      Marland Kitchen HYDROcodone-acetaminophen (NORCO/VICODIN) 5-325 MG tablet Take 1 tablet by mouth every 12 (twelve) hours as needed for moderate pain. 45 tablet 0   . loratadine (CLARITIN) 10 MG tablet Take 10 mg by mouth daily as needed.      . meloxicam (MOBIC) 7.5 MG tablet Take 7.5 mg by mouth 2 (two) times daily.     . montelukast (SINGULAIR) 10 MG tablet TAKE 1 TABLET BY MOUTH AT BEDTIME 60 tablet 1 08/23/2019 at 2200  . Multiple Vitamin (MULTIVITAMIN) tablet Take 1 tablet by mouth daily.     Marland Kitchen omeprazole (PRILOSEC) 40 MG capsule Take 40 mg by mouth daily.      . ondansetron (ZOFRAN) 8 MG tablet Take 1 tablet (8 mg total) by mouth every 8 (eight) hours as needed for nausea or vomiting. 20 tablet 3   . prochlorperazine (COMPAZINE) 10 MG tablet Take 1 tablet (10 mg total) by mouth every 6 (six) hours as needed for  nausea or vomiting. 30 tablet 3   . risperiDONE (RISPERDAL) 1 MG tablet Take 1 tablet (1 mg total) by mouth at bedtime. (Patient taking differently: Take 1 mg by mouth 2 (two) times daily. ) 30 tablet 0 08/23/2019 at 2200  . salmeterol (SEREVENT) 50 MCG/DOSE diskus inhaler Inhale 1 puff into the lungs 2 (two) times daily.   Not Taking at Unknown time    Current medications: Current Facility-Administered Medications  Medication Dose Route Frequency Provider Last Rate Last Admin  . acetaminophen (TYLENOL) tablet 650 mg  650 mg Oral Q6H PRN Ivor Costa, MD       Or  . acetaminophen (TYLENOL) suppository 650 mg  650 mg Rectal Q6H PRN Ivor Costa, MD      . albuterol (PROVENTIL) (2.5 MG/3ML) 0.083% nebulizer solution 2.5 mg  2.5 mg Inhalation Q4H PRN Ivor Costa, MD      . ALPRAZolam Duanne Moron) tablet 0.5 mg  0.5 mg Oral BID PRN Sharion Settler, NP   0.5 mg at 08/25/19 0320  . arformoterol (BROVANA) nebulizer solution 15 mcg  15 mcg  Nebulization BID Ivor Costa, MD   15 mcg at 08/25/19 0742  . cefTRIAXone (ROCEPHIN) 1 g in sodium chloride 0.9 % 100 mL IVPB  1 g Intravenous Q24H Ivor Costa, MD      . Chlorhexidine Gluconate Cloth 2 % PADS 6 each  6 each Topical Q0600 Ivor Costa, MD   6 each at 08/25/19 [phone]  . cyclobenzaprine (FLEXERIL) tablet 10 mg  10 mg Oral TID Ivor Costa, MD   10 mg at 08/25/19 0908  . dextromethorphan-guaiFENesin (MUCINEX DM) 30-600 MG per 12 hr tablet 1 tablet  1 tablet Oral BID Ivor Costa, MD   1 tablet at 08/25/19 0910  . diltiazem (CARDIZEM CD) 24 hr capsule 120 mg  120 mg Oral Daily Ivor Costa, MD   120 mg at 08/25/19 0910  . divalproex (DEPAKOTE) DR tablet 500 mg  500 mg Oral Q12H Ivor Costa, MD   500 mg at 08/25/19 0909  . enoxaparin (LOVENOX) injection 40 mg  40 mg Subcutaneous Q24H Ivor Costa, MD   40 mg at 08/24/19 2127  . fluconazole (DIFLUCAN) tablet 100 mg  100 mg Oral Daily Ivor Costa, MD   100 mg at 08/25/19 0908  . fluticasone (FLONASE) 50 MCG/ACT nasal spray 1 spray  1 spray Each Nare Daily PRN Ivor Costa, MD      . guaiFENesin-codeine 100-10 MG/5ML solution 10 mL  10 mL Oral Q4H PRN Sharion Settler, NP   10 mL at 08/25/19 7793  . hydrALAZINE (APRESOLINE) tablet 25 mg  25 mg Oral TID PRN Ivor Costa, MD      . HYDROcodone-acetaminophen (NORCO/VICODIN) 5-325 MG per tablet 1 tablet  1 tablet Oral Q6H PRN Ivor Costa, MD   1 tablet at 08/25/19 0907  . ipratropium (ATROVENT) nebulizer solution 0.5 mg  2.5 mL Inhalation Q8H Ivor Costa, MD   0.5 mg at 08/25/19 0547  . loratadine (CLARITIN) tablet 10 mg  10 mg Oral Daily PRN Ivor Costa, MD      . montelukast (SINGULAIR) tablet 10 mg  10 mg Oral QHS Ivor Costa, MD   10 mg at 08/24/19 2131  . multivitamin with minerals tablet 1 tablet  1 tablet Oral Daily Ivor Costa, MD   1 tablet at 08/25/19 0907  . mupirocin ointment (BACTROBAN) 2 %   Nasal BID Ivor Costa, MD   Given at 08/25/19 0912  . ondansetron (ZOFRAN) tablet  4 mg  4 mg Oral Q6H PRN Ivor Costa, MD       Or  . ondansetron [facility]) injection 4 mg  4 mg Intravenous Q6H PRN Ivor Costa, MD   4 mg at 08/24/19 2359  . pantoprazole (PROTONIX) EC tablet 40 mg  40 mg Oral Daily Ivor Costa, MD   40 mg at 08/25/19 0907  . risperiDONE (RISPERDAL) tablet 1 mg  1 mg Oral BID Ivor Costa, MD   1 mg at 08/25/19 4235      Allergies: Allergies  Allergen Reactions  . Amoxicillin-Pot Clavulanate Hives    Also vomiting  . Sulfamethoxazole-Trimethoprim Hives and Nausea And Vomiting      Past Medical History: Past Medical History:  Diagnosis Date  . Asthma   . Bipolar 2 disorder ([place])   . Borderline schizophrenia ([place])   . COPD (chronic obstructive pulmonary disease) ([place])   . Falls infrequently   . GERD (gastroesophageal reflux disease)   . Hypertension   . Port-A-Cath in place   . Primary cancer of right lower lobe of lung ([place]) 07/12/2015   Small cell undifferentiated carcinoma of lung.  Diagnosis at [facility] by fine-needle aspiration of lymph node (January, 2017)  . Ulcer      Past Surgical History: Past Surgical History:  Procedure Laterality Date  . ABDOMINAL HYSTERECTOMY    . APPENDECTOMY    . borderline schizophrenia    . chronic back pain    . HIP PINNING,CANNULATED Right 12/08/2017   Procedure: CANNULATED HIP PINNING;  Surgeon: Thornton Park, MD;  Location: [facility] ORS;  Service: Orthopedics;  Laterality: Right;  . NASAL SINUS SURGERY     x2  . PERIPHERAL VASCULAR CATHETERIZATION N/A 07/30/2015   Procedure: Glori Luis Cath Insertion;  Surgeon: Algernon Huxley, MD;  Location: [facility];  Service: Cardiovascular;  Laterality: N/A;  . STOMACH SURGERY       Family History: Family History  Problem Relation Age of Onset  . Hypertension Father      Social History: Social History   Socioeconomic History  . Marital status: Divorced    Spouse name: Not on file  . Number of children: Not on file  . Years of education: Not on file  . Highest education  level: Not on file  Occupational History  . Not on file  Tobacco Use  . Smoking status: Former Smoker    Packs/day: 0.50    Years: 30.00    Pack years: 15.00    Quit date: 06/17/2015    Years since quitting: 4.1  . Smokeless tobacco: Never Used  Substance and Sexual Activity  . Alcohol use: No    Alcohol/week: 0.0 standard drinks  . Drug use: Yes    Types: Cocaine    Comment: States due to cocaine in mouthwash  . Sexual activity: Not on file  Other Topics Concern  . Not on file  Social History Narrative  . Not on file   Social Determinants of Health   Financial Resource Strain:   . Difficulty of Paying Living Expenses:   Food Insecurity:   . Worried About Charity fundraiser in the Last Year:   . Arboriculturist in the Last Year:   Transportation Needs:   . Film/video editor (Medical):   Marland Kitchen Lack of Transportation (Non-Medical):   Physical Activity:   . Days of Exercise per Week:   . Minutes of Exercise per Session:   Stress:   .  Feeling of Stress :   Social Connections:   . Frequency of Communication with Friends and Family:   . Frequency of Social Gatherings with Friends and Family:   . Attends Religious Services:   . Active Member of Clubs or Organizations:   . Attends Archivist Meetings:   Marland Kitchen Marital Status:   Intimate Partner Violence:   . Fear of Current or Ex-Partner:   . Emotionally Abused:   Marland Kitchen Physically Abused:   . Sexually Abused:      Review of Systems: Review of Systems  Constitutional: Positive for malaise/fatigue. Negative for chills and fever.  HENT: Negative for congestion, hearing loss and tinnitus.   Eyes: Negative for blurred vision and double vision.  Respiratory: Negative for cough, sputum production and shortness of breath.   Cardiovascular: Negative for chest pain, palpitations and orthopnea.  Gastrointestinal: Negative for diarrhea, nausea and vomiting.  Genitourinary: Negative for dysuria, frequency and urgency.   Musculoskeletal: Negative for myalgias.  Skin: Negative for itching and rash.  Neurological: Positive for dizziness and weakness. Negative for focal weakness.  Endo/Heme/Allergies: Negative for polydipsia. Does not bruise/bleed easily.  Psychiatric/Behavioral: The patient is not nervous/anxious.      Vital Signs: Blood pressure 117/82, pulse 94, temperature 97.6 F (36.4 C), temperature source Oral, resp. rate (!) 27, height 5\' 8"  (1.727 m), weight 93.9 kg, SpO2 96 %.  Weight trends: Filed Weights   08/24/19 1042 08/24/19 1806  Weight: 93 kg 93.9 kg    Physical Exam: General: NAD,   Head: Normocephalic, atraumatic.  Eyes: Anicteric, EOMI  Nose: Mucous membranes moist, not inflammed, nonerythematous.  Throat: Oropharynx nonerythematous, no exudate appreciated.   Neck: Supple, trachea midline.  Lungs:  Normal respiratory effort. Clear to auscultation BL without crackles or wheezes.  Heart: RRR. S1 and S2 normal without gallop, murmur, or rubs.  Abdomen:  BS normoactive. Soft, Nondistended, non-tender.  No masses or organomegaly.  Extremities: No pretibial edema.  Neurologic: A&O X3, Motor strength is 5/5 in the all 4 extremities  Skin: No visible rashes, scars.    Lab results: Basic Metabolic Panel: Recent Labs  Lab 08/24/19 0936 08/24/19 0936 08/24/19 1044 08/24/19 1044 08/24/19 1445 08/24/19 1700 08/25/19 0511  NA 111*   < > 112*   < > 112* 112* 120*  K 3.3*  --  3.1*  --   --   --  3.0*  CL 77*  --  75*  --   --   --  86*  CO2 23  --  25  --   --   --  27  GLUCOSE 122*  --  125*  --   --   --  104*  BUN 6  --  7  --   --   --  5*  CREATININE 0.34*  --  <0.30*  --   --   --  0.34*  CALCIUM 8.1*  --  8.4*  --   --   --  7.7*  MG  --   --   --   --  2.0  --   --    < > = values in this interval not displayed.    Liver Function Tests: No results for input(s): AST, ALT, ALKPHOS, BILITOT, PROT, ALBUMIN in the last 168 hours. No results for input(s): LIPASE,  AMYLASE in the last 168 hours. No results for input(s): AMMONIA in the last 168 hours.  CBC: Recent Labs  Lab 08/24/19 0936 08/24/19 1044 08/25/19  0511  WBC 14.8* 15.6* 5.3  NEUTROABS 13.5*  --   --   HGB 11.2* 11.4* 9.9*  HCT 31.7* 32.2* 27.5*  MCV 85.2 84.7 85.1  PLT 225 211 166    Cardiac Enzymes: No results for input(s): CKTOTAL, CKMB, CKMBINDEX, TROPONINI in the last 168 hours.  BNP: Invalid input(s): POCBNP  CBG: Recent Labs  Lab 08/24/19 1807  GLUCAP 151*    Microbiology: Results for orders placed or performed during the hospital encounter of 08/24/19  SARS CORONAVIRUS 2 (TAT 6-24 HRS) Nasopharyngeal Nasopharyngeal Swab     Status: None   Collection Time: 08/24/19  1:37 PM   Specimen: Nasopharyngeal Swab  Result Value Ref Range Status   SARS Coronavirus 2 NEGATIVE NEGATIVE Final    Comment: (NOTE) SARS-CoV-2 target nucleic acids are NOT DETECTED. The SARS-CoV-2 RNA is generally detectable in upper and lower respiratory specimens during the acute phase of infection. Negative results do not preclude SARS-CoV-2 infection, do not rule out co-infections with other pathogens, and should not be used as the sole basis for treatment or other patient management decisions. Negative results must be combined with clinical observations, patient history, and epidemiological information. The expected result is Negative. Fact Sheet for Patients: SugarRoll.be Fact Sheet for Healthcare Providers: https://www.woods-mathews.com/ This test is not yet approved or cleared by the [place] FDA and  has been authorized for detection and/or diagnosis of SARS-CoV-2 by FDA under an Emergency Use Authorization (EUA). This EUA will remain  in effect (meaning this test can be used) for the duration of the COVID-19 declaration under Section 56 4(b)(1) of the Act, 21 U.S.C. section 360bbb-3(b)(1), unless the authorization is terminated  or revoked sooner. Performed at [facility], [place] [address] [place], [place] [address]   Respiratory Panel by RT PCR (Flu A&B, Covid) - Nasopharyngeal Swab     Status: None   Collection Time: 08/24/19  4:13 PM   Specimen: Nasopharyngeal Swab  Result Value Ref Range Status   SARS Coronavirus 2 by RT PCR NEGATIVE NEGATIVE Final    Comment: (NOTE) SARS-CoV-2 target nucleic acids are NOT DETECTED. The SARS-CoV-2 RNA is generally detectable in upper respiratoy specimens during the acute phase of infection. The lowest concentration of SARS-CoV-2 viral copies this assay can detect is 131 copies/mL. A negative result does not preclude SARS-Cov-2 infection and should not be used as the sole basis for treatment or other patient management decisions. A negative result may occur with  improper specimen collection/handling, submission of specimen other than nasopharyngeal swab, presence of viral mutation(s) within the areas targeted by this assay, and inadequate number of viral copies (<131 copies/mL). A negative result must be combined with clinical observations, patient history, and epidemiological information. The expected result is Negative. Fact Sheet for Patients:  PinkCheek.be Fact Sheet for Healthcare Providers:  GravelBags.it This test is not yet ap proved or cleared by the [place] FDA and  has been authorized for detection and/or diagnosis of SARS-CoV-2 by FDA under an Emergency Use Authorization (EUA). This EUA will remain  in effect (meaning this test can be used) for the duration of the COVID-19 declaration under Section 564(b)(1) of the Act, 21 U.S.C. section 360bbb-3(b)(1), unless the authorization is terminated or revoked sooner.    Influenza A by PCR NEGATIVE NEGATIVE Final   Influenza B by PCR NEGATIVE NEGATIVE Final    Comment: (NOTE) The Xpert Xpress SARS-CoV-2/FLU/RSV assay is intended as an aid in   the diagnosis of influenza from Nasopharyngeal  swab specimens and  should not be used as a sole basis for treatment. Nasal washings and  aspirates are unacceptable for Xpert Xpress SARS-CoV-2/FLU/RSV  testing. Fact Sheet for Patients: PinkCheek.be Fact Sheet for Healthcare Providers: GravelBags.it This test is not yet approved or cleared by the [place] FDA and  has been authorized for detection and/or diagnosis of SARS-CoV-2 by  FDA under an Emergency Use Authorization (EUA). This EUA will remain  in effect (meaning this test can be used) for the duration of the  Covid-19 declaration under Section 564(b)(1) of the Act, 21  U.S.C. section 360bbb-3(b)(1), unless the authorization is  terminated or revoked. Performed at [facility], [address] [place], [place] [address]   MRSA PCR Screening     Status: Abnormal   Collection Time: 08/24/19  6:10 PM   Specimen: Nasopharyngeal  Result Value Ref Range Status   MRSA by PCR POSITIVE (A) NEGATIVE Final    Comment:        The GeneXpert MRSA Assay (FDA approved for NASAL specimens only), is one component of a comprehensive MRSA colonization surveillance program. It is not intended to diagnose MRSA infection nor to guide or monitor treatment for MRSA infections. RESULT CALLED TO, READ BACK BY AND VERIFIED WITH: KRISTY ADELOWO AT 1950 08/24/2019  TFK Performed at [facility], [address] [place], [place] [address]   CULTURE, BLOOD (ROUTINE X 2) w Reflex to ID Panel     Status: None (Preliminary result)   Collection Time: 08/24/19  6:16 PM   Specimen: BLOOD  Result Value Ref Range Status   Specimen Description BLOOD LEFT ANTECUBITAL  Final   Special Requests   Final    BOTTLES DRAWN AEROBIC AND ANAEROBIC Blood Culture adequate volume   Culture   Final    NO GROWTH < 24 HOURS Performed at [facility], [address],  [place], [place] [address]    Report Status PENDING  Incomplete  CULTURE, BLOOD (ROUTINE X 2) w Reflex to ID Panel     Status: None (Preliminary result)   Collection Time: 08/24/19  6:23 PM   Specimen: BLOOD  Result Value Ref Range Status   Specimen Description BLOOD BLOOD RIGHT HAND  Final   Special Requests   Final    BOTTLES DRAWN AEROBIC AND ANAEROBIC Blood Culture adequate volume   Culture   Final    NO GROWTH < 24 HOURS Performed at [facility], [address] [place], [place] [address]    Report Status PENDING  Incomplete    Coagulation Studies: No results for input(s): LABPROT, INR in the last 72 hours.  Urinalysis: Recent Labs    08/24/19 1337  COLORURINE AMBER*  LABSPEC 1.014  PHURINE 9.0*  GLUCOSEU NEGATIVE  HGBUR NEGATIVE  BILIRUBINUR NEGATIVE  KETONESUR 5*  PROTEINUR 30*  NITRITE NEGATIVE  LEUKOCYTESUR TRACE*      Imaging:  No results found.   Assessment & Plan: Pt is a 59 y.o. 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   1.  Hyponatremia likely secondary to SIADH. 2.  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 3.  Hypokalemia. 4.  Anemia unspecified.   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  Serum sodium was 111 upon admission.  Serum osmolality was 233 with a  urine osmolality of 528.  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  Patient started on 3% saline yesterday and serum sodium up to 120 indicating appropriate correction.  We will restart 3% saline now at 20 cc/h and continue to monitor serum sodium closely.  Otherwise treatment of her small cell carcinoma as per oncology. Replete potassium and monitor closely as well.  Thanks for consultation.

## 2019-08-25 NOTE — Progress Notes (Signed)
[facility] Progress Note  Patient: Charlene [name]    LNL:[id number]  DOA: 08/24/2019     Date of Service: the patient was seen and examined on 08/25/2019  Chief Complaint  Patient presents with  . Weakness  . Abnormal Lab   Brief hospital course: Charlene [name] is a 59 y.o. 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  Pt was in cancer center for labs drawn before plan to have chemotherapy today. She was found to have hyponatremia with sodium of 111.  Patient states that she has generalized weakness and dizziness.  No confusion or seizure. 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  Assessment and Plan: 1.  Hyponatremia secondary to SIADH Continue stepdown unit. Continue 3% saline per nephrology. Monitor sodium every 4 hours. Continue fluid restriction. Avoid overcorrection.  2.  Right lower lobe small cell lung cancer stage IV. History of COPD. Chronic cough On chemotherapy outpatient. Last dose was 08/19/2019. Follow-up with oncology. Currently on fluconazole which continue. Continue bronchodilators and Singulair. We will add cough medication  3.  Hypokalemia Repleted.  Monitor.  4.  Concern for UTI Currently on IV Rocephin.  Monitor.  5.  Essential hypertension Blood pressure controlled. Continue Cardizem.  Monitor.  6.  GERD Continue PPI.  Body mass index is 31.48 kg/m.   Diet: Regular diet DVT Prophylaxis: Subcutaneous Heparin    Advance goals of care discussion: Full code  Family Communication: no family was present at bedside, at the time of interview.   Disposition:  Pt is from home, admitted with abnormal lab with hyponatremia, still has severe hyponatremia, which precludes a safe discharge. Discharge to home, when medically stable.  Subjective:  Reports back pain neck pain shoulder pain.  Also reports cough.  No nausea or vomiting.  Shortness of breath at baseline.  Physical Exam: General:  alert oriented to time, place, and person.  Appear in mild distress, affect appropriate Eyes: PERRL ENT: Oral Mucosa Clear, moist  Neck: no JVD,  Cardiovascular: S1 and S2 Present, no Murmur,  Respiratory: good respiratory effort, Bilateral Air entry equal and Decreased, no Crackles, bilateral  wheezes Abdomen: Bowel Sound present, Soft and no tenderness,  Skin: no rash Extremities: Trace pedal edema, no calf tenderness Neurologic: without any new focal findings Gait not checked due to patient safety concerns  Vitals:   08/25/19 1500 08/25/19 1600 08/25/19 1700 08/25/19 1800  BP: 120/78 117/80 111/68 96/63  Pulse: 97 96 88 88  Resp: (!) 22 (!) 30 15 12   Temp:  97.9 F (36.6 C)    TempSrc:  Oral    SpO2: 98% 96% 93% 91%  Weight:      Height:        Intake/Output Summary (Last 24 hours) at 08/25/2019 1848 Last data filed at 08/25/2019 1800 Gross per 24 hour  Intake 1036.32 ml  Output 1150 ml  Net -113.68 ml   Filed Weights   08/24/19 1042 08/24/19 1806  Weight: 93 kg 93.9 kg    Data Reviewed: I have personally reviewed and interpreted daily labs, tele strips, imagings as discussed above. I reviewed all nursing notes, pharmacy notes, vitals, pertinent old records I have discussed plan of care as described above with RN and patient/family.  CBC: Recent Labs  Lab 08/24/19 0936 08/24/19 1044 08/25/19 0511  WBC 14.8* 15.6* 5.3  NEUTROABS 13.5*  --   --   HGB 11.2* 11.4* 9.9*  HCT 31.7* 32.2* 27.5*  MCV 85.2 84.7 85.1  PLT 225 211 003   Basic Metabolic Panel: Recent Labs  Lab 08/24/19 0936 08/24/19 1044 08/24/19 1445 08/24/19 1700 08/25/19 0511  NA 111* 112* 112* 112* 120*  K 3.3* 3.1*  --   --  3.0*  CL 77* 75*  --   --  86*  CO2 23 25  --   --  27  GLUCOSE 122* 125*  --   --  104*  BUN 6 7  --   --  5*   CREATININE 0.34* <0.30*  --   --  0.34*  CALCIUM 8.1* 8.4*  --   --  7.7*  MG  --   --  2.0  --   --     Studies: No results found.  Scheduled Meds: . arformoterol  15 mcg Nebulization BID  . Chlorhexidine Gluconate Cloth  6 each Topical Q0600  . cyclobenzaprine  10 mg Oral TID  . dextromethorphan-guaiFENesin  1 tablet Oral BID  . diltiazem  120 mg Oral Daily  . divalproex  500 mg Oral Q12H  . enoxaparin (LOVENOX) injection  40 mg Subcutaneous Q24H  . fluconazole  100 mg Oral Daily  . ipratropium  2.5 mL Inhalation Q8H  . montelukast  10 mg Oral QHS  . multivitamin with minerals  1 tablet Oral Daily  . mupirocin ointment   Nasal BID  . pantoprazole  40 mg Oral Daily  . risperiDONE  1 mg Oral BID   Continuous Infusions: . cefTRIAXone (ROCEPHIN)  IV 1 g (08/25/19 1755)  . sodium chloride (hypertonic) 20 mL/hr (08/25/19 1230)   PRN Meds: acetaminophen **OR** acetaminophen, albuterol, ALPRAZolam, fluticasone, guaiFENesin-codeine, hydrALAZINE, HYDROcodone-acetaminophen, loratadine, ondansetron **OR** ondansetron (ZOFRAN) IV  Time spent: 35 minutes  Author: Berle Mull, MD [facility] 08/25/2019 6:48 PM  To reach On-call, see care teams to locate the attending and reach out to them via [email or web address]. If 7PM-7AM, please contact night-coverage If you still have difficulty reaching the attending provider, please page the [facility] (Director on Call) for [facility] on amion for assistance.

## 2019-08-26 ENCOUNTER — Other Ambulatory Visit: Payer: Self-pay | Admitting: Internal Medicine

## 2019-08-26 LAB — SODIUM
Sodium: 127 mmol/L — ABNORMAL LOW (ref 135–145)
Sodium: 130 mmol/L — ABNORMAL LOW (ref 135–145)
Sodium: 126 mmol/L — ABNORMAL LOW (ref 135–145)
Sodium: 125 mmol/L — ABNORMAL LOW (ref 135–145)

## 2019-08-26 LAB — CBC
HCT: 28.7 % — ABNORMAL LOW (ref 36.0–46.0)
HCT: 29.4 % — ABNORMAL LOW (ref 36.0–46.0)
Hemoglobin: 9.5 g/dL — ABNORMAL LOW (ref 12.0–15.0)
Hemoglobin: 9.6 g/dL — ABNORMAL LOW (ref 12.0–15.0)
MCH: 29.5 pg (ref 26.0–34.0)
MCH: 29.6 pg (ref 26.0–34.0)
MCHC: 33.1 g/dL (ref 30.0–36.0)
MCHC: 32.7 g/dL (ref 30.0–36.0)
MCV: 89.1 fL (ref 80.0–100.0)
MCV: 90.7 fL (ref 80.0–100.0)
Platelets: 148 10*3/uL — ABNORMAL LOW (ref 150–400)
Platelets: 160 10*3/uL (ref 150–400)
RBC: 3.22 MIL/uL — ABNORMAL LOW (ref 3.87–5.11)
RBC: 3.24 MIL/uL — ABNORMAL LOW (ref 3.87–5.11)
RDW: 15.4 % (ref 11.5–15.5)
RDW: 15.8 % — ABNORMAL HIGH (ref 11.5–15.5)
WBC: 1.8 10*3/uL — ABNORMAL LOW (ref 4.0–10.5)
WBC: 1.9 10*3/uL — ABNORMAL LOW (ref 4.0–10.5)
nRBC: 0 % (ref 0.0–0.2)
nRBC: 0 % (ref 0.0–0.2)

## 2019-08-26 LAB — BASIC METABOLIC PANEL
Anion gap: 6 (ref 5–15)
BUN: 9 mg/dL (ref 6–20)
CO2: 25 mmol/L (ref 22–32)
Calcium: 8 mg/dL — ABNORMAL LOW (ref 8.9–10.3)
Chloride: 97 mmol/L — ABNORMAL LOW (ref 98–111)
Creatinine, Ser: 0.51 mg/dL (ref 0.44–1.00)
GFR calc Af Amer: >60 mL/min (ref >60)
GFR calc non Af Amer: >60 mL/min (ref >60)
Glucose, Bld: 102 mg/dL — ABNORMAL HIGH (ref 70–99)
Potassium: 4.3 mmol/L (ref 3.5–5.1)
Sodium: 128 mmol/L — ABNORMAL LOW (ref 135–145)

## 2019-08-26 LAB — URINE CULTURE: Culture: >=100000 — AB

## 2019-08-26 LAB — DIFFERENTIAL
Abs Immature Granulocytes: 0.03 10*3/uL (ref 0.00–0.07)
Basophils Absolute: 0 10*3/uL (ref 0.0–0.1)
Basophils Relative: 2 %
Eosinophils Absolute: 0.1 10*3/uL (ref 0.0–0.5)
Eosinophils Relative: 6 %
Immature Granulocytes: 2 %
Lymphocytes Relative: 34 %
Lymphs Abs: 0.6 10*3/uL — ABNORMAL LOW (ref 0.7–4.0)
Monocytes Absolute: 0.1 10*3/uL (ref 0.1–1.0)
Monocytes Relative: 6 %
Neutro Abs: 1 10*3/uL — ABNORMAL LOW (ref 1.7–7.7)
Neutrophils Relative %: 50 %

## 2019-08-26 LAB — MAGNESIUM: Magnesium: 1.9 mg/dL (ref 1.7–2.4)

## 2019-08-26 MED ORDER — CEFDINIR 300 MG PO CAPS
300.0000 mg | ORAL_CAPSULE | Freq: Two times a day (BID) | ORAL | Status: DC
  Administered 2019-08-26 – 2019-08-27 (×2): 300 mg via ORAL
  Filled 2019-08-26 (×3): qty 1

## 2019-08-26 MED ORDER — SODIUM CHLORIDE 3 % IV SOLN
INTRAVENOUS | Status: DC
  Administered 2019-08-27 – 2019-08-28 (×2): 25 mL/h via INTRAVENOUS
  Filled 2019-08-26 (×4): qty 500

## 2019-08-26 NOTE — Consult Note (Signed)
[place] for Sodium Monitoring   Recent Labs: Potassium (mmol/L)  Date Value  08/26/2019 4.3   Magnesium (mg/dL)  Date Value  08/26/2019 1.9   Calcium (mg/dL)  Date Value  08/26/2019 8.0 (L)   Albumin (g/dL)  Date Value  08/15/2019 3.3 (L)   Sodium (mmol/L)  Date Value  08/26/2019 126 (L)     Assessment: Pharmacy has been consulted to monitor sodium levels and follow up with provider. Hypertonic saline held this am and is now ordered to resume.   Goal: Target Sodium of 136 on 3/13  Plan:  Per nephrology, hypertonic saline resumed at 64mL/hr. Sodium levels ordered for Q4hr. Next level at 1600.   3/12:  Na @ 1727 = 126 .  Per Dr Holley Raring , will increase hypertonic saline to 25 ml/hr. Will recheck Na in 4 hrs @ 2200.   Pharmacy will continue to monitor and adjust per consult.   MLS 08/24/2019 12:49 PM

## 2019-08-26 NOTE — Consult Note (Signed)
[place] for Sodium Monitoring   Recent Labs: Potassium (mmol/L)  Date Value  08/26/2019 4.3   Magnesium (mg/dL)  Date Value  08/26/2019 1.9   Calcium (mg/dL)  Date Value  08/26/2019 8.0 (L)   Albumin (g/dL)  Date Value  08/15/2019 3.3 (L)   Sodium (mmol/L)  Date Value  08/26/2019 128 (L)     Assessment: Pharmacy has been consulted to monitor sodium levels and follow up with provider. Hypertonic saline held this am and is now ordered to resume.   Goal: Increase Na by 4-6 mEq in 4-6 hours.   Plan:  03/12 @ 0243 Na 128 mEq/L up from 122 mEq/L exactly 8 mEq/L in the past 22 hours; therefore spoke to NP to reduce rate from 20 ml/hr to 10 ml/hr and will continue to monitor q4h Na checks.  Tobie Lords, PharmD, BCPS Clinical Pharmacist 08/24/2019 12:49 PM

## 2019-08-26 NOTE — Progress Notes (Signed)
[facility] Progress Note  Patient: Charlene [name]    JJK:[id number]  DOA: 08/24/2019     Date of Service: the patient was seen and examined on 08/26/2019  Chief Complaint  Patient presents with  . Weakness  . Abnormal Lab   Brief hospital course: Charlene [name] is a 59 y.o. 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  Pt was in cancer center for labs drawn before plan to have chemotherapy today. She was found to have hyponatremia with sodium of 111.  Patient states that she has generalized weakness and dizziness.  No confusion or seizure. 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  Assessment and Plan: 1.  Hyponatremia secondary to SIADH Continue stepdown unit. Continue 3% saline per nephrology. Monitor sodium every 4 hours. Continue fluid restriction. Avoid overcorrection.  2.  Right lower lobe small cell lung cancer stage IV. History of COPD. Chronic cough On chemotherapy outpatient. Last dose was 08/19/2019. Follow-up with oncology. Currently on fluconazole which continue. Continue bronchodilators and Singulair. We will add cough medication  3.  Hypokalemia Repleted.  Monitor.  4.  Proteus UTI Currently on IV Rocephin.  Urine culture growing Proteus.  Will transitioning to oral Omnicef..  5.  Essential hypertension Blood pressure controlled. Continue Cardizem.  Monitor.  6.  GERD Continue PPI.  Body mass index is 31.48 kg/m.   Diet: Regular diet DVT Prophylaxis: Subcutaneous Heparin    Advance goals of care discussion: Full code  Family Communication: no family was present at bedside, at the time of interview.   Disposition:  Pt is from home, admitted with abnormal lab with hyponatremia, still has severe hyponatremia, which precludes a safe  discharge. Discharge to home, when medically stable.  Subjective: Pain better.  Cough better.  Still fatigue and tired.  No nausea or vomiting.  Also reports dizziness and lightheadedness.  No focal deficit.  Physical Exam: General:  alert oriented to time, place, and person.  Appear in mild distress, affect appropriate Eyes: PERRL ENT: Oral Mucosa Clear, moist  Neck: no JVD,  Cardiovascular: S1 and S2 Present, no Murmur,  Respiratory: good respiratory effort, Bilateral Air entry equal and Decreased, no Crackles, bilateral  wheezes Abdomen: Bowel Sound present, Soft and no tenderness,  Skin: no rash Extremities: Trace pedal edema, no calf tenderness Neurologic: without any new focal findings Gait not checked due to patient safety concerns  Vitals:   08/26/19 1500 08/26/19 1600 08/26/19 1700 08/26/19 1800  BP: 103/68   112/68  Pulse: 94 (!) 101 (!) 101 100  Resp: 16 (!) 21 15 16   Temp:      TempSrc:      SpO2: 96% 97% 92% 92%  Weight:      Height:        Intake/Output Summary (Last 24 hours) at 08/26/2019 1956 Last data filed at 08/26/2019 1800 Gross per 24 hour  Intake 1013.72 ml  Output 1070 ml  Net -56.28 ml   Filed Weights   08/24/19 1042 08/24/19 1806  Weight: 93 kg 93.9 kg    Data Reviewed: I have personally reviewed and interpreted daily labs, tele strips, imagings as discussed above. I reviewed all nursing notes, pharmacy notes, vitals, pertinent old records I have discussed plan of care as described above with RN and patient/family.  CBC: Recent Labs  Lab 08/24/19 [id number]  08/24/19 1044 08/25/19 0511 08/26/19 0243 08/26/19 0841  WBC 14.8* 15.6* 5.3 1.8* 1.9*  NEUTROABS 13.5*  --   --   --  1.0*  HGB 11.2* 11.4* 9.9* 9.5* 9.6*  HCT 31.7* 32.2* 27.5* 28.7* 29.4*  MCV 85.2 84.7 85.1 89.1 90.7  PLT 225 211 166 148* 622   Basic Metabolic Panel: Recent Labs  Lab 08/24/19 0936 08/24/19 0936 08/24/19 1044 08/24/19 1044 08/24/19 1445 08/24/19 1700  08/25/19 0511 08/25/19 0511 08/25/19 1837 08/26/19 0243 08/26/19 0841 08/26/19 1231 08/26/19 1727  NA 111*   < > 112*   < > 112*   < > 120*   < > 122* 128* 127* 130* 126*  K 3.3*  --  3.1*  --   --   --  3.0*  --   --  4.3  --   --   --   CL 77*  --  75*  --   --   --  86*  --   --  97*  --   --   --   CO2 23  --  25  --   --   --  27  --   --  25  --   --   --   GLUCOSE 122*  --  125*  --   --   --  104*  --   --  102*  --   --   --   BUN 6  --  7  --   --   --  5*  --   --  9  --   --   --   CREATININE 0.34*  --  <0.30*  --   --   --  0.34*  --   --  0.51  --   --   --   CALCIUM 8.1*  --  8.4*  --   --   --  7.7*  --   --  8.0*  --   --   --   MG  --   --   --   --  2.0  --   --   --   --  1.9  --   --   --    < > = values in this interval not displayed.    Studies: No results found.  Scheduled Meds: . arformoterol  15 mcg Nebulization BID  . benzonatate  100 mg Oral TID  . Chlorhexidine Gluconate Cloth  6 each Topical Q0600  . cyclobenzaprine  10 mg Oral TID  . dextromethorphan-guaiFENesin  1 tablet Oral BID  . diltiazem  120 mg Oral Daily  . divalproex  500 mg Oral Q12H  . enoxaparin (LOVENOX) injection  40 mg Subcutaneous Q24H  . fluconazole  100 mg Oral Daily  . ipratropium  2.5 mL Inhalation Q8H  . montelukast  10 mg Oral QHS  . multivitamin with minerals  1 tablet Oral Daily  . mupirocin ointment   Nasal BID  . pantoprazole  40 mg Oral Daily  . risperiDONE  1 mg Oral BID   Continuous Infusions: . cefTRIAXone (ROCEPHIN)  IV 1 g (08/26/19 1707)  . sodium chloride (hypertonic) 20 mL/hr (08/26/19 1111)   PRN Meds: acetaminophen **OR** acetaminophen, albuterol, ALPRAZolam, fluticasone, guaiFENesin-codeine, hydrALAZINE, HYDROcodone-acetaminophen, loratadine, ondansetron **OR** ondansetron (ZOFRAN) IV  Time spent: 35 minutes  Author: Berle Mull, MD [facility] 08/26/2019 7:56 PM  To reach On-call, see care teams to locate the attending  and reach out to them  via [email or web address]. If 7PM-7AM, please contact night-coverage If you still have difficulty reaching the attending provider, please page the [facility] (Director on Call) for [facility] on amion for assistance.

## 2019-08-26 NOTE — Evaluation (Addendum)
Physical Therapy Evaluation Patient Details Name: Charlene [name] MRN: [id number] DOB: March 02, 1961 Today's Date: 08/26/2019   History of Present Illness  Charlene [name] is a 24yoF who comes to [facility] 3/10 lightheadedness, dizziness, and hyponatremia (Na+: 111).  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 At time of evaluation, Na+: corrected to 130.  Clinical Impression  Pt admitted with above diagnosis. Pt currently with functional limitations due to the deficits listed below (see "PT Problem List"). Upon entry, pt in bed, awake and agreeable to participate. The pt is alert and oriented x4, pleasant, conversational, and generally a good historian. Supervision for safety for mobility to EOB; pt unsteady and dizzy whist seated, foot support irrelevant. 2 Hand held support given for STS from EOB, quite unsteady, but able to remain up for 60sec, albeit dizziness progresses. Pt able to take several small unsteady steps at EOB to center self in bed. Orthostatic vitals demonstrate a slight drop from seated to standing, however patient is unable to remain in standing for BP evaluation, as it is established immediately upon return to sitting. Functional mobility assessment demonstrates increased effort/time requirements, poor tolerance, and need for physical assistance, whereas the patient performed these at a higher level of independence PTA. Pt will benefit from skilled PT intervention to increase independence and safety with basic mobility in preparation for discharge to the venue listed below.       Follow Up Recommendations Home health PT;Supervision - Intermittent    Equipment Recommendations  Other (comment)(a transport chair would allow for easy navigation to weekly chemotherapy appointments.)    Recommendations for Other Services       Precautions / Restrictions Precautions Precautions: Fall      Mobility  Bed  Mobility Overal bed mobility: Needs Assistance Bed Mobility: Supine to Sit;Sit to Supine     Supine to sit: Modified independent (Device/Increase time) Sit to supine: Modified independent (Device/Increase time)   General bed mobility comments: labored, but able to perform without assistance  Transfers Overall transfer level: Needs assistance Equipment used: 1 person hand held assist Transfers: Sit to/from Stand Sit to Stand: Min assist         General transfer comment: quit eunsteady, but able to remain steady with BUE hand held assist; dizziness worsens over 60 seconds.  Ambulation/Gait Ambulation/Gait assistance: (limited by dizziness. Pt able to take a few small steps at EOB)              Stairs            Wheelchair Mobility    Modified Rankin (Stroke Patients Only)       Balance Overall balance assessment: Needs assistance Sitting-balance support: Single extremity supported;Feet supported;Feet unsupported Sitting balance-Leahy Scale: Fair     Standing balance support: Bilateral upper extremity supported;During functional activity                                 Pertinent Vitals/Pain Pain Assessment: 0-10 Pain Score: 7  Pain Location: Rt hip pain, back pain, neck pain Pain Descriptors / Indicators: Aching Pain Intervention(s): Limited activity within patient's tolerance;Monitored during session;Premedicated before session;Repositioned    Home Living Family/patient expects to be discharged to:: Private residence Living Arrangements: Parent(parents and brother;) Available Help at Discharge: Family Type of Home: House Home Access: Level entry     Home Layout: One level Home Equipment: Environmental consultant - 2 wheels;Tub bench  Additional Comments: Used a RW 43ma s/p hip fracture s/p ORIF    Prior Function Level of Independence: Independent         Comments: Needs help from family to get out of bathtub     Hand Dominance   Dominant  Hand: Right    Extremity/Trunk Assessment                Communication   Communication: No difficulties  Cognition Arousal/Alertness: Awake/alert Behavior During Therapy: WFL for tasks assessed/performed Overall Cognitive Status: Within Functional Limits for tasks assessed                                        General Comments      Exercises     Assessment/Plan    PT Assessment Patient needs continued PT services  PT Problem List Decreased strength;Decreased range of motion;Cardiopulmonary status limiting activity;Decreased activity tolerance;Decreased balance;Decreased mobility       PT Treatment Interventions Therapeutic exercise;Gait training;Functional mobility training;Therapeutic activities;Balance training;Neuromuscular re-education;Cognitive remediation;Patient/family education    PT Goals (Current goals can be found in the Care Plan section)  Acute Rehab PT Goals Patient Stated Goal: regain strength and balance PT Goal Formulation: With patient Time For Goal Achievement: 09/09/19 Potential to Achieve Goals: Good    Frequency Min 2X/week   Barriers to discharge        Co-evaluation               AM-PAC PT "6 Clicks" Mobility  Outcome Measure Help needed turning from your back to your side while in a flat bed without using bedrails?: None Help needed moving from lying on your back to sitting on the side of a flat bed without using bedrails?: A Little Help needed moving to and from a bed to a chair (including a wheelchair)?: A Little Help needed standing up from a chair using your arms (e.g., wheelchair or bedside chair)?: A Little Help needed to walk in hospital room?: A Little Help needed climbing 3-5 steps with a railing? : A Lot 6 Click Score: 18    End of Session Equipment Utilized During Treatment: Oxygen Activity Tolerance: Patient tolerated treatment well;No increased pain;Treatment limited secondary to medical  complications (Comment)(dizziness upon standingl orthostatic intolerance) Patient left: in bed;with nursing/sitter in room;with call bell/phone within reach Nurse Communication: Mobility status(O2 doffed, satting on room air, 93%) PT Visit Diagnosis: Unsteadiness on feet (R26.81);Other abnormalities of gait and mobility (R26.89);Difficulty in walking, not elsewhere classified (R26.2);Dizziness and giddiness (R42)    Time: 6553-7482 PT Time Calculation (min) (ACUTE ONLY): 25 min   Charges:   PT Evaluation $PT Eval Moderate Complexity: 1 Mod PT Treatments $Therapeutic Exercise: 8-22 mins        7:05 PM, 08/26/19 Etta Grandchild, PT, DPT Physical Therapist - [facility]  [phone] ([place])    [place] [facility] 08/26/2019, 7:05 PM

## 2019-08-26 NOTE — Consult Note (Signed)
[place] for Sodium Monitoring   Recent Labs: Potassium (mmol/L)  Date Value  08/26/2019 4.3   Magnesium (mg/dL)  Date Value  08/26/2019 1.9   Calcium (mg/dL)  Date Value  08/26/2019 8.0 (L)   Albumin (g/dL)  Date Value  08/15/2019 3.3 (L)   Sodium (mmol/L)  Date Value  08/26/2019 130 (L)     Assessment: Pharmacy has been consulted to monitor sodium levels and follow up with provider. Hypertonic saline held this am and is now ordered to resume.   Goal: Target Sodium of 136 on 3/13  Plan:  Per nephrology, hypertonic saline resumed at 21mL/hr. Sodium levels ordered for Q4hr. Next level at 1600.   Pharmacy will continue to monitor and adjust per consult.   MLS 08/24/2019 12:49 PM

## 2019-08-26 NOTE — Progress Notes (Signed)
Visited with patient in the ICU-currently admitted hospital for hyponatremia/SIADH secondary malignancy.  Patient status post chemotherapy; diarrhea improved.  Overall guarded prognosis.

## 2019-08-26 NOTE — Progress Notes (Signed)
[facility]  ROUNDING NOTE   Subjective:  Case discussed with pharmacy this AM. 3% saline was temporarily held. Serum sodium up to 127. Patient still weak but awake and alert.   Objective:  Vital signs in last 24 hours:  Temp:  [97.9 F (36.6 C)-99.1 F (37.3 C)] 98.8 F (37.1 C) (03/12 0800) Pulse Rate:  [85-108] 100 (03/12 1100) Resp:  [12-30] 16 (03/12 1100) BP: (95-121)/(56-82) 110/56 (03/12 1100) SpO2:  [91 %-100 %] 97 % (03/12 1100)  Weight change:  Filed Weights   08/24/19 1042 08/24/19 1806  Weight: 93 kg 93.9 kg    Intake/Output: I/O last 3 completed shifts: In: 1294.5 [P.O.:240; I.V.:954.5; IV Piggyback:100] Out: 1150 [Urine:1150]   Intake/Output this shift:  Total I/O In: 279.2 [P.O.:240; I.V.:39.2] Out: -   Physical Exam: General: No acute distress  Head: Normocephalic, atraumatic. Moist oral mucosal membranes  Eyes: Anicteric  Neck: Supple, trachea midline  Lungs:  Clear to auscultation, normal effort  Heart: S1S2 no rubs  Abdomen:  Soft, nontender, bowel sounds present  Extremities: No peripheral edema.  Neurologic: Awake, alert, conversant  Skin: No lesions       Basic Metabolic Panel: Recent Labs  Lab 08/24/19 0936 08/24/19 0936 08/24/19 1044 08/24/19 1044 08/24/19 1445 08/24/19 1445 08/24/19 1700 08/25/19 0511 08/25/19 1837 08/26/19 0243 08/26/19 0841  NA 111*   < > 112*   < > 112*   < > 112* 120* 122* 128* 127*  K 3.3*  --  3.1*  --   --   --   --  3.0*  --  4.3  --   CL 77*  --  75*  --   --   --   --  86*  --  97*  --   CO2 23  --  25  --   --   --   --  27  --  25  --   GLUCOSE 122*  --  125*  --   --   --   --  104*  --  102*  --   BUN 6  --  7  --   --   --   --  5*  --  9  --   CREATININE 0.34*  --  <0.30*  --   --   --   --  0.34*  --  0.51  --   CALCIUM 8.1*   < > 8.4*  --   --   --   --  7.7*  --  8.0*  --   MG  --   --   --   --  2.0  --   --   --   --  1.9  --    < > = values in this interval not  displayed.    Liver Function Tests: No results for input(s): AST, ALT, ALKPHOS, BILITOT, PROT, ALBUMIN in the last 168 hours. No results for input(s): LIPASE, AMYLASE in the last 168 hours. No results for input(s): AMMONIA in the last 168 hours.  CBC: Recent Labs  Lab 08/24/19 0936 08/24/19 1044 08/25/19 0511 08/26/19 0243 08/26/19 0841  WBC 14.8* 15.6* 5.3 1.8* 1.9*  NEUTROABS 13.5*  --   --   --  1.0*  HGB 11.2* 11.4* 9.9* 9.5* 9.6*  HCT 31.7* 32.2* 27.5* 28.7* 29.4*  MCV 85.2 84.7 85.1 89.1 90.7  PLT 225 211 166 148* 160    Cardiac Enzymes: No results for input(s):  CKTOTAL, CKMB, CKMBINDEX, TROPONINI in the last 168 hours.  BNP: Invalid input(s): POCBNP  CBG: Recent Labs  Lab 08/24/19 1807  GLUCAP 151*    Microbiology: Results for orders placed or performed during the hospital encounter of 08/24/19  SARS CORONAVIRUS 2 (TAT 6-24 HRS) Nasopharyngeal Nasopharyngeal Swab     Status: None   Collection Time: 08/24/19  1:37 PM   Specimen: Nasopharyngeal Swab  Result Value Ref Range Status   SARS Coronavirus 2 NEGATIVE NEGATIVE Final    Comment: (NOTE) SARS-CoV-2 target nucleic acids are NOT DETECTED. The SARS-CoV-2 RNA is generally detectable in upper and lower respiratory specimens during the acute phase of infection. Negative results do not preclude SARS-CoV-2 infection, do not rule out co-infections with other pathogens, and should not be used as the sole basis for treatment or other patient management decisions. Negative results must be combined with clinical observations, patient history, and epidemiological information. The expected result is Negative. Fact Sheet for Patients: SugarRoll.be Fact Sheet for Healthcare Providers: https://www.woods-mathews.com/ This test is not yet approved or cleared by the [place] FDA and  has been authorized for detection and/or diagnosis of SARS-CoV-2 by FDA under an Emergency  Use Authorization (EUA). This EUA will remain  in effect (meaning this test can be used) for the duration of the COVID-19 declaration under Section 56 4(b)(1) of the Act, 21 U.S.C. section 360bbb-3(b)(1), unless the authorization is terminated or revoked sooner. Performed at [facility], [place] [address] [place], [place] [address]   Urine Culture     Status: Abnormal (Preliminary result)   Collection Time: 08/24/19  1:37 PM   Specimen: Urine, Random  Result Value Ref Range Status   Specimen Description   Final    URINE, RANDOM Performed at [facility], [address], [place], [place] [address]    Special Requests   Final    NONE Performed at [facility], [address], [place], [place] [address]    Culture (A)  Final    >=100,000 COLONIES/mL PROTEUS MIRABILIS SUSCEPTIBILITIES TO FOLLOW Performed at [facility], [place] [address], [place], [place] [address]    Report Status PENDING  Incomplete  Respiratory Panel by RT PCR (Flu A&B, Covid) - Nasopharyngeal Swab     Status: None   Collection Time: 08/24/19  4:13 PM   Specimen: Nasopharyngeal Swab  Result Value Ref Range Status   SARS Coronavirus 2 by RT PCR NEGATIVE NEGATIVE Final    Comment: (NOTE) SARS-CoV-2 target nucleic acids are NOT DETECTED. The SARS-CoV-2 RNA is generally detectable in upper respiratoy specimens during the acute phase of infection. The lowest concentration of SARS-CoV-2 viral copies this assay can detect is 131 copies/mL. A negative result does not preclude SARS-Cov-2 infection and should not be used as the sole basis for treatment or other patient management decisions. A negative result may occur with  improper specimen collection/handling, submission of specimen other than nasopharyngeal swab, presence of viral mutation(s) within the areas targeted by this assay, and inadequate number of viral copies (<131 copies/mL). A negative result must be combined with  clinical observations, patient history, and epidemiological information. The expected result is Negative. Fact Sheet for Patients:  PinkCheek.be Fact Sheet for Healthcare Providers:  GravelBags.it This test is not yet ap proved or cleared by the [place] FDA and  has been authorized for detection and/or diagnosis of SARS-CoV-2 by FDA under an Emergency Use Authorization (EUA). This EUA will remain  in effect (meaning  this test can be used) for the duration of the COVID-19 declaration under Section 564(b)(1) of the Act, 21 U.S.C. section 360bbb-3(b)(1), unless the authorization is terminated or revoked sooner.    Influenza A by PCR NEGATIVE NEGATIVE Final   Influenza B by PCR NEGATIVE NEGATIVE Final    Comment: (NOTE) The Xpert Xpress SARS-CoV-2/FLU/RSV assay is intended as an aid in  the diagnosis of influenza from Nasopharyngeal swab specimens and  should not be used as a sole basis for treatment. Nasal washings and  aspirates are unacceptable for Xpert Xpress SARS-CoV-2/FLU/RSV  testing. Fact Sheet for Patients: PinkCheek.be Fact Sheet for Healthcare Providers: GravelBags.it This test is not yet approved or cleared by the [place] FDA and  has been authorized for detection and/or diagnosis of SARS-CoV-2 by  FDA under an Emergency Use Authorization (EUA). This EUA will remain  in effect (meaning this test can be used) for the duration of the  Covid-19 declaration under Section 564(b)(1) of the Act, 21  U.S.C. section 360bbb-3(b)(1), unless the authorization is  terminated or revoked. Performed at [facility], [address] [place], [place] [address]   MRSA PCR Screening     Status: Abnormal   Collection Time: 08/24/19  6:10 PM   Specimen: Nasopharyngeal  Result Value Ref Range Status   MRSA by PCR POSITIVE (A) NEGATIVE Final    Comment:         The GeneXpert MRSA Assay (FDA approved for NASAL specimens only), is one component of a comprehensive MRSA colonization surveillance program. It is not intended to diagnose MRSA infection nor to guide or monitor treatment for MRSA infections. RESULT CALLED TO, READ BACK BY AND VERIFIED WITH: KRISTY ADELOWO AT 1950 08/24/2019  TFK Performed at [facility], [address] [place], [place] [address]   CULTURE, BLOOD (ROUTINE X 2) w Reflex to ID Panel     Status: None (Preliminary result)   Collection Time: 08/24/19  6:16 PM   Specimen: BLOOD  Result Value Ref Range Status   Specimen Description BLOOD LEFT ANTECUBITAL  Final   Special Requests   Final    BOTTLES DRAWN AEROBIC AND ANAEROBIC Blood Culture adequate volume   Culture   Final    NO GROWTH 2 DAYS Performed at [facility], [address], [place], [place] [address]    Report Status PENDING  Incomplete  CULTURE, BLOOD (ROUTINE X 2) w Reflex to ID Panel     Status: None (Preliminary result)   Collection Time: 08/24/19  6:23 PM   Specimen: BLOOD  Result Value Ref Range Status   Specimen Description BLOOD BLOOD RIGHT HAND  Final   Special Requests   Final    BOTTLES DRAWN AEROBIC AND ANAEROBIC Blood Culture adequate volume   Culture   Final    NO GROWTH 2 DAYS Performed at [facility], [address], [place], [place] [address]    Report Status PENDING  Incomplete    Coagulation Studies: No results for input(s): LABPROT, INR in the last 72 hours.  Urinalysis: Recent Labs    08/24/19 1337  COLORURINE AMBER*  LABSPEC 1.014  PHURINE 9.0*  GLUCOSEU NEGATIVE  HGBUR NEGATIVE  BILIRUBINUR NEGATIVE  KETONESUR 5*  PROTEINUR 30*  NITRITE NEGATIVE  LEUKOCYTESUR TRACE*      Imaging: No results found.   Medications:   . cefTRIAXone (ROCEPHIN)  IV 1 g (08/25/19 1755)  . sodium chloride (hypertonic) 20 mL/hr (08/26/19 1111)   . arformoterol  15 mcg Nebulization BID  .  benzonatate  100 mg Oral TID  . Chlorhexidine Gluconate Cloth  6 each Topical Q0600  . cyclobenzaprine  10 mg Oral TID  . dextromethorphan-guaiFENesin  1 tablet Oral BID  . diltiazem  120 mg Oral Daily  . divalproex  500 mg Oral Q12H  . enoxaparin (LOVENOX) injection  40 mg Subcutaneous Q24H  . fluconazole  100 mg Oral Daily  . ipratropium  2.5 mL Inhalation Q8H  . montelukast  10 mg Oral QHS  . multivitamin with minerals  1 tablet Oral Daily  . mupirocin ointment   Nasal BID  . pantoprazole  40 mg Oral Daily  . risperiDONE  1 mg Oral BID   acetaminophen **OR** acetaminophen, albuterol, ALPRAZolam, fluticasone, guaiFENesin-codeine, hydrALAZINE, HYDROcodone-acetaminophen, loratadine, ondansetron **OR** ondansetron (ZOFRAN) IV  Assessment/ Plan:  59 y.o. 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   1.  Hyponatremia likely secondary to SIADH. 2.  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 3.  Hypokalemia. 4.  Anemia unspecified.            Plan: Serum sodium continues to rise at an appropriate rate.  3% saline reinstituted this a.m. at 20 cc/h.  Target sodium by tomorrow is 136.  Thereafter would recommend fluid restriction, salt tablets, and consideration of low-dose furosemide.  Serum potassium corrected to 4.3.  Patient also anemic but defer further management to hematology.   LOS: 2 Sencere Symonette 3/12/202112:25 PM

## 2019-08-27 ENCOUNTER — Encounter: Payer: Self-pay | Admitting: Internal Medicine

## 2019-08-27 ENCOUNTER — Other Ambulatory Visit: Payer: Self-pay

## 2019-08-27 ENCOUNTER — Inpatient Hospital Stay: Payer: Medicare Other

## 2019-08-27 LAB — CBC
HCT: 27.3 % — ABNORMAL LOW (ref 36.0–46.0)
Hemoglobin: 8.9 g/dL — ABNORMAL LOW (ref 12.0–15.0)
MCH: 29.6 pg (ref 26.0–34.0)
MCHC: 32.6 g/dL (ref 30.0–36.0)
MCV: 90.7 fL (ref 80.0–100.0)
Platelets: 129 10*3/uL — ABNORMAL LOW (ref 150–400)
RBC: 3.01 MIL/uL — ABNORMAL LOW (ref 3.87–5.11)
RDW: 15.8 % — ABNORMAL HIGH (ref 11.5–15.5)
WBC: 1 10*3/uL — CL (ref 4.0–10.5)
nRBC: 0 % (ref 0.0–0.2)

## 2019-08-27 LAB — SODIUM
Sodium: 126 mmol/L — ABNORMAL LOW (ref 135–145)
Sodium: 125 mmol/L — ABNORMAL LOW (ref 135–145)
Sodium: 128 mmol/L — ABNORMAL LOW (ref 135–145)
Sodium: 127 mmol/L — ABNORMAL LOW (ref 135–145)
Sodium: 129 mmol/L — ABNORMAL LOW (ref 135–145)
Sodium: 127 mmol/L — ABNORMAL LOW (ref 135–145)

## 2019-08-27 MED ORDER — SENNOSIDES-DOCUSATE SODIUM 8.6-50 MG PO TABS
1.0000 | ORAL_TABLET | Freq: Two times a day (BID) | ORAL | Status: DC
  Administered 2019-08-27 – 2019-08-30 (×6): 1 via ORAL
  Filled 2019-08-27 (×6): qty 1

## 2019-08-27 MED ORDER — GUAIFENESIN ER 600 MG PO TB12
1200.0000 mg | ORAL_TABLET | Freq: Two times a day (BID) | ORAL | Status: DC
  Administered 2019-08-27 – 2019-08-30 (×7): 1200 mg via ORAL
  Filled 2019-08-27 (×7): qty 2

## 2019-08-27 MED ORDER — LEVALBUTEROL HCL 0.63 MG/3ML IN NEBU
0.6300 mg | INHALATION_SOLUTION | Freq: Four times a day (QID) | RESPIRATORY_TRACT | Status: DC
  Administered 2019-08-27 – 2019-08-28 (×5): 0.63 mg via RESPIRATORY_TRACT
  Filled 2019-08-27 (×5): qty 3

## 2019-08-27 MED ORDER — FLUTICASONE PROPIONATE 50 MCG/ACT NA SUSP
2.0000 | Freq: Every day | NASAL | Status: DC
  Administered 2019-08-27 – 2019-08-30 (×4): 2 via NASAL
  Filled 2019-08-27: qty 16

## 2019-08-27 MED ORDER — CEFDINIR 300 MG PO CAPS
300.0000 mg | ORAL_CAPSULE | Freq: Two times a day (BID) | ORAL | Status: AC
  Administered 2019-08-27 – 2019-08-29 (×5): 300 mg via ORAL
  Filled 2019-08-27 (×5): qty 1

## 2019-08-27 MED ORDER — IPRATROPIUM BROMIDE 0.02 % IN SOLN
0.5000 mg | Freq: Four times a day (QID) | RESPIRATORY_TRACT | Status: DC
  Administered 2019-08-27 – 2019-08-28 (×4): 0.5 mg via RESPIRATORY_TRACT
  Filled 2019-08-27 (×4): qty 2.5

## 2019-08-27 MED ORDER — OXYCODONE-ACETAMINOPHEN 5-325 MG PO TABS
1.0000 | ORAL_TABLET | ORAL | Status: DC | PRN
  Administered 2019-08-28 – 2019-08-30 (×6): 1 via ORAL
  Filled 2019-08-27 (×6): qty 1

## 2019-08-27 MED ORDER — DILTIAZEM HCL 30 MG PO TABS
30.0000 mg | ORAL_TABLET | Freq: Four times a day (QID) | ORAL | Status: DC
  Administered 2019-08-27 – 2019-08-28 (×3): 30 mg via ORAL
  Filled 2019-08-27 (×3): qty 1

## 2019-08-27 MED ORDER — LACTULOSE 10 GM/15ML PO SOLN: 20.0000 g | Freq: Once | ORAL | Status: DC

## 2019-08-27 MED ORDER — SODIUM CHLORIDE 0.9 % IV BOLUS: 500.0000 mL | Freq: Once | INTRAVENOUS | Status: DC

## 2019-08-27 MED ORDER — POLYETHYLENE GLYCOL 3350 17 G PO PACK
17.0000 g | PACK | Freq: Every day | ORAL | Status: DC
  Administered 2019-08-29: 17 g via ORAL
  Filled 2019-08-27 (×3): qty 1

## 2019-08-27 MED ORDER — INFLUENZA VAC SPLIT QUAD 0.5 ML IM SUSY
0.5000 mL | PREFILLED_SYRINGE | Freq: Once | INTRAMUSCULAR | Status: DC
  Filled 2019-08-27: qty 0.5

## 2019-08-27 MED ORDER — DEXTROMETHORPHAN POLISTIREX ER 30 MG/5ML PO SUER
30.0000 mg | Freq: Two times a day (BID) | ORAL | Status: DC
  Administered 2019-08-27 – 2019-08-28 (×4): 30 mg via ORAL
  Filled 2019-08-27 (×6): qty 5

## 2019-08-27 NOTE — Progress Notes (Signed)
[facility] Progress Note  Patient: Charlene [name]    LFY:[id number]  DOA: 08/24/2019     Date of Service: the patient was seen and examined on 08/27/2019  Chief Complaint  Patient presents with  . Weakness  . Abnormal Lab   Brief hospital course: Charlene [name] is a 59 y.o. 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  Pt was in cancer center for labs drawn before plan to have chemotherapy today. She was found to have hyponatremia with sodium of 111.  Patient states that she has generalized weakness and dizziness.  No confusion or seizure. 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  Assessment and Plan: 1.  Hyponatremia secondary to SIADH Continue stepdown unit. Continue 3% saline per nephrology. Monitor sodium every 4 hours. Continue fluid restriction. Avoid overcorrection.  2.  Right lower lobe small cell lung cancer stage IV. History of COPD. Chronic cough On chemotherapy outpatient. Last dose was 08/19/2019. Follow-up with oncology. Currently on fluconazole which continue. Continues to have shortness of breath seen. Change to bronchodilators and cough medicines.  3.  Hypokalemia Repleted.  Monitor.  4.  Proteus UTI Currently on IV Rocephin.  Urine culture growing Proteus.  Will transitioning to oral Omnicef..  5.  Essential hypertension Blood pressure controlled. Was hypotensive this morning.  Had to hold Cardizem. Given her normal blood pressure after bolus continue Cardizem.  Monitor.  6.  GERD Continue PPI.  7.  Tachycardia. Patient is on Cardizem at home. Due to hypotension Cardizem was on hold. We will resume Cardizem given her tachycardia at 4 times daily immediate release dose.  8.  Cancer-related pain. Patient is on Norco. Changing to Percocet given  her uncontrolled pain.  9.  Anxiety. Added Xanax.  10 constipation. Bowel regimen adjusted as well. X-ray shows no evidence of obstruction.  Body mass index is 31.48 kg/m.   Diet: Regular diet DVT Prophylaxis: Subcutaneous Heparin    Advance goals of care discussion: Full code  Family Communication: no family was present at bedside, at the time of interview.   Disposition:  Pt is from home, admitted with abnormal lab with hyponatremia, still has severe hyponatremia, which precludes a safe discharge. Discharge to home, when medically stable.  Subjective: Continues to have cough continues to have pain.  No nausea no vomiting.  Reports fatigue and tiredness.  Was hypotensive this morning.  Blood pressure not better. Continues to report constipation.  Physical Exam: General:  alert oriented to time, place, and person.  Appear in mild distress, affect appropriate Eyes: PERRL ENT: Oral Mucosa Clear, moist  Neck: no JVD,  Cardiovascular: S1 and S2 Present, no Murmur,  Respiratory: good respiratory effort, Bilateral Air entry equal and Decreased, no Crackles, bilateral  wheezes Abdomen: Bowel Sound present, Soft and no tenderness,  Skin: no rash Extremities: Trace pedal edema, no calf tenderness Neurologic: without any new focal findings Gait not checked due to patient safety concerns  Vitals:   08/27/19 1257 08/27/19 1300 08/27/19 1400 08/27/19 1500  BP:  121/76 125/66 104/79  Pulse: (!) 113 (!) 112 (!) 111 (!) 126  Resp: 16 18 19  (!) 22  Temp:   (P) 98.7 F (37.1 C)   TempSrc:   Oral   SpO2: 98%     Weight:      Height:  Intake/Output Summary (Last 24 hours) at 08/27/2019 1551 Last data filed at 08/27/2019 1400 Gross per 24 hour  Intake 1156.85 ml  Output 2370 ml  Net -1213.15 ml   Filed Weights   08/24/19 1042 08/24/19 1806 08/27/19 0500  Weight: 93 kg 93.9 kg 93.9 kg    Data Reviewed: I have personally reviewed and interpreted daily labs, tele strips,  imagings as discussed above. I reviewed all nursing notes, pharmacy notes, vitals, pertinent old records I have discussed plan of care as described above with RN and patient/family.  CBC: Recent Labs  Lab 08/24/19 0936 08/24/19 0936 08/24/19 1044 08/25/19 0511 08/26/19 0243 08/26/19 0841 08/27/19 0501  WBC 14.8*   < > 15.6* 5.3 1.8* 1.9* 1.0*  NEUTROABS 13.5*  --   --   --   --  1.0*  --   HGB 11.2*   < > 11.4* 9.9* 9.5* 9.6* 8.9*  HCT 31.7*   < > 32.2* 27.5* 28.7* 29.4* 27.3*  MCV 85.2   < > 84.7 85.1 89.1 90.7 90.7  PLT 225   < > 211 166 148* 160 129*   < > = values in this interval not displayed.   Basic Metabolic Panel: Recent Labs  Lab 08/24/19 0936 08/24/19 0936 08/24/19 1044 08/24/19 1044 08/24/19 1445 08/24/19 1700 08/25/19 0511 08/25/19 1837 08/26/19 0243 08/26/19 0841 08/26/19 2205 08/27/19 0200 08/27/19 0501 08/27/19 1028 08/27/19 1323  NA 111*   < > 112*   < > 112*   < > 120*   < > 128*   < > 125* 126* 125* 128* 127*  K 3.3*  --  3.1*  --   --   --  3.0*  --  4.3  --   --   --   --   --   --   CL 77*  --  75*  --   --   --  86*  --  97*  --   --   --   --   --   --   CO2 23  --  25  --   --   --  27  --  25  --   --   --   --   --   --   GLUCOSE 122*  --  125*  --   --   --  104*  --  102*  --   --   --   --   --   --   BUN 6  --  7  --   --   --  5*  --  9  --   --   --   --   --   --   CREATININE 0.34*  --  <0.30*  --   --   --  0.34*  --  0.51  --   --   --   --   --   --   CALCIUM 8.1*  --  8.4*  --   --   --  7.7*  --  8.0*  --   --   --   --   --   --   MG  --   --   --   --  2.0  --   --   --  1.9  --   --   --   --   --   --    < > = values in this interval  not displayed.    Studies: DG Chest Port 1 View  Result Date: 08/27/2019 CLINICAL DATA:  SOB, constipation. Patient admitted on 08/24/2019 for hyponatremia. Patient currently undergoing treatment for lung cancer and liver cancer per patient. Hx of asthma, COPD, HTN, appendectomy, stomach  surgery, abdominal hysterectomy, former smoker. EXAM: PORTABLE CHEST 1 VIEW COMPARISON:  03/08/2018.  Chest CT, 08/05/2019. FINDINGS: Ill-defined right hilar mass is similar to the recent prior chest CT, increased when compared to the prior chest radiograph. Linear/reticular opacities extend from the right hilar mass, also similar to the prior chest CT. Mild opacity at the left lung base is consistent with atelectasis. No convincing pneumonia. No pulmonary edema. No pleural effusion or pneumothorax. Cardiac silhouette is normal in size.  Normal left hilar contour. Right anterior chest wall Port-A-Cath is stable from the prior chest CT. Skeletal structures are grossly intact. IMPRESSION: 1. No acute cardiopulmonary disease. 2. Changes on the right reflect the patient's known lung carcinoma, similar in appearance to the recent prior chest CT. No convincing pneumonia and no pulmonary edema. Electronically Signed   By: Lajean Manes M.D.   On: 08/27/2019 11:04   DG Abd Portable 1V  Result Date: 08/27/2019 CLINICAL DATA:  SOB, constipation. Patient admitted on 08/24/2019 for hyponatremia. Patient currently undergoing treatment for lung cancer and liver cancer per patient. Hx of asthma, COPD, HTN, appendectomy, stomach surgery, abdominal hysterectomy, former smoker. EXAM: PORTABLE ABDOMEN - 1 VIEW COMPARISON:  CT, 01/25/2016 FINDINGS: Normal bowel gas pattern.  Mild increased right colon stool burden. No evidence of renal or ureteral stones. Soft tissues are unremarkable. Status post right hip pinning.  No acute skeletal abnormality. IMPRESSION: 1. No acute findings.  No evidence of bowel obstruction. 2. Mild increased right colon stool burden. Electronically Signed   By: Lajean Manes M.D.   On: 08/27/2019 11:06    Scheduled Meds: . benzonatate  100 mg Oral TID  . cefdinir  300 mg Oral Q12H  . Chlorhexidine Gluconate Cloth  6 each Topical Q0600  . cyclobenzaprine  10 mg Oral TID  . dextromethorphan  30 mg Oral  BID  . diltiazem  30 mg Oral Q6H  . divalproex  500 mg Oral Q12H  . enoxaparin (LOVENOX) injection  40 mg Subcutaneous Q24H  . fluconazole  100 mg Oral Daily  . fluticasone  2 spray Each Nare Daily  . guaiFENesin  1,200 mg Oral BID  . influenza vac split quadrivalent PF  0.5 mL Intramuscular Once  . ipratropium  0.5 mg Nebulization QID  . levalbuterol  0.63 mg Nebulization QID  . montelukast  10 mg Oral QHS  . multivitamin with minerals  1 tablet Oral Daily  . mupirocin ointment   Nasal BID  . pantoprazole  40 mg Oral Daily  . polyethylene glycol  17 g Oral Daily  . risperiDONE  1 mg Oral BID  . senna-docusate  1 tablet Oral BID   Continuous Infusions: . sodium chloride (hypertonic) 25 mL/hr (08/27/19 0700)  . sodium chloride     PRN Meds: acetaminophen **OR** acetaminophen, ALPRAZolam, guaiFENesin-codeine, hydrALAZINE, loratadine, ondansetron **OR** ondansetron (ZOFRAN) IV, oxyCODONE-acetaminophen  Time spent: 35 minutes  Author: Berle Mull, MD [facility] 08/27/2019 3:51 PM  To reach On-call, see care teams to locate the attending and reach out to them via [email or web address]. If 7PM-7AM, please contact night-coverage If you still have difficulty reaching the attending provider, please page the [facility] (Director on Call) for [facility] on amion for assistance.

## 2019-08-27 NOTE — Progress Notes (Signed)
Spoke with Shanon Brow in pharmacy in regards to 3% gtt orders. Confirmed the gtt is to be running at 38ml/hr. Pt's sodium level  is still being check at a q4hr interval with the next level due to be drawn at [place].

## 2019-08-27 NOTE — Consult Note (Addendum)
[facility] for Sodium Monitoring   Recent Labs: Potassium (mmol/L)  Date Value  08/26/2019 4.3   Magnesium (mg/dL)  Date Value  08/26/2019 1.9   Calcium (mg/dL)  Date Value  08/26/2019 8.0 (L)   Albumin (g/dL)  Date Value  08/15/2019 3.3 (L)   Sodium (mmol/L)  Date Value  08/26/2019 125 (L)     Assessment: Pharmacy has been consulted to monitor sodium levels and follow up with provider. Hypertonic saline held this am and is now ordered to resume.   Goal: Target Sodium of 136 on 3/13  Plan:  03/12 @ 2200 Na 125 trending WNL. 3% running at 25 ml/hr will continue to monitor w/ q4h Na checks.  Tobie Lords, PharmD, BCPS Clinical Pharmacist 08/24/2019 12:49 PM

## 2019-08-27 NOTE — Progress Notes (Signed)
[facility]  ROUNDING NOTE   Subjective:   3% saline infusion at 40mL/hr. Na 125.   Patient complains of shortness of breath.    Objective:  Vital signs in last 24 hours:  Temp:  [97.7 F (36.5 C)-98.4 F (36.9 C)] 97.7 F (36.5 C) (03/13 0200) Pulse Rate:  [93-107] 93 (03/13 0741) Resp:  [13-22] 13 (03/13 0741) BP: (102-130)/(56-85) 125/77 (03/13 0400) SpO2:  [92 %-99 %] 98 % (03/13 0741) Weight:  [93.9 kg] 93.9 kg (03/13 0500)  Weight change:  Filed Weights   08/24/19 1042 08/24/19 1806 08/27/19 0500  Weight: 93 kg 93.9 kg 93.9 kg    Intake/Output: I/O last 3 completed shifts: In: 1453.7 [P.O.:920; I.V.:433.7; IV Piggyback:100] Out: 2070 [Urine:2070]   Intake/Output this shift:  No intake/output data recorded.  Physical Exam: General: No acute distress, laying in bed  Head: Normocephalic, atraumatic. Moist oral mucosal membranes  Eyes: Anicteric  Neck: Supple, trachea midline  Lungs:  +wheezing.   Heart: regular  Abdomen:  Soft, nontender, bowel sounds present  Extremities: + peripheral edema.  Neurologic: Awake, alert, conversant  Skin: No lesions       Basic Metabolic Panel: Recent Labs  Lab 08/24/19 0936 08/24/19 0936 08/24/19 1044 08/24/19 1044 08/24/19 1445 08/24/19 1700 08/25/19 0511 08/25/19 1837 08/26/19 0243 08/26/19 0841 08/26/19 1231 08/26/19 1727 08/26/19 2205 08/27/19 0200 08/27/19 0501  NA 111*   < > 112*   < > 112*   < > 120*   < > 128*   < > 130* 126* 125* 126* 125*  K 3.3*  --  3.1*  --   --   --  3.0*  --  4.3  --   --   --   --   --   --   CL 77*  --  75*  --   --   --  86*  --  97*  --   --   --   --   --   --   CO2 23  --  25  --   --   --  27  --  25  --   --   --   --   --   --   GLUCOSE 122*  --  125*  --   --   --  104*  --  102*  --   --   --   --   --   --   BUN 6  --  7  --   --   --  5*  --  9  --   --   --   --   --   --   CREATININE 0.34*  --  <0.30*  --   --   --  0.34*  --  0.51  --   --   --    --   --   --   CALCIUM 8.1*   < > 8.4*  --   --   --  7.7*  --  8.0*  --   --   --   --   --   --   MG  --   --   --   --  2.0  --   --   --  1.9  --   --   --   --   --   --    < > = values in this interval not displayed.  Liver Function Tests: No results for input(s): AST, ALT, ALKPHOS, BILITOT, PROT, ALBUMIN in the last 168 hours. No results for input(s): LIPASE, AMYLASE in the last 168 hours. No results for input(s): AMMONIA in the last 168 hours.  CBC: Recent Labs  Lab 08/24/19 0936 08/24/19 0936 08/24/19 1044 08/25/19 0511 08/26/19 0243 08/26/19 0841 08/27/19 0501  WBC 14.8*   < > 15.6* 5.3 1.8* 1.9* 1.0*  NEUTROABS 13.5*  --   --   --   --  1.0*  --   HGB 11.2*   < > 11.4* 9.9* 9.5* 9.6* 8.9*  HCT 31.7*   < > 32.2* 27.5* 28.7* 29.4* 27.3*  MCV 85.2   < > 84.7 85.1 89.1 90.7 90.7  PLT 225   < > 211 166 148* 160 129*   < > = values in this interval not displayed.    Cardiac Enzymes: No results for input(s): CKTOTAL, CKMB, CKMBINDEX, TROPONINI in the last 168 hours.  BNP: Invalid input(s): POCBNP  CBG: Recent Labs  Lab 08/24/19 1807  GLUCAP 151*    Microbiology: Results for orders placed or performed during the hospital encounter of 08/24/19  SARS CORONAVIRUS 2 (TAT 6-24 HRS) Nasopharyngeal Nasopharyngeal Swab     Status: None   Collection Time: 08/24/19  1:37 PM   Specimen: Nasopharyngeal Swab  Result Value Ref Range Status   SARS Coronavirus 2 NEGATIVE NEGATIVE Final    Comment: (NOTE) SARS-CoV-2 target nucleic acids are NOT DETECTED. The SARS-CoV-2 RNA is generally detectable in upper and lower respiratory specimens during the acute phase of infection. Negative results do not preclude SARS-CoV-2 infection, do not rule out co-infections with other pathogens, and should not be used as the sole basis for treatment or other patient management decisions. Negative results must be combined with clinical observations, patient history, and epidemiological  information. The expected result is Negative. Fact Sheet for Patients: SugarRoll.be Fact Sheet for Healthcare Providers: https://www.woods-mathews.com/ This test is not yet approved or cleared by the [place] FDA and  has been authorized for detection and/or diagnosis of SARS-CoV-2 by FDA under an Emergency Use Authorization (EUA). This EUA will remain  in effect (meaning this test can be used) for the duration of the COVID-19 declaration under Section 56 4(b)(1) of the Act, 21 U.S.C. section 360bbb-3(b)(1), unless the authorization is terminated or revoked sooner. Performed at [facility], [place] [address] [place], [place] [address]   Urine Culture     Status: Abnormal   Collection Time: 08/24/19  1:37 PM   Specimen: Urine, Random  Result Value Ref Range Status   Specimen Description   Final    URINE, RANDOM Performed at [facility], [address], [place], [place] [address]    Special Requests   Final    NONE Performed at [facility], [address] [place],  [address]    Culture >=100,000 COLONIES/mL PROTEUS MIRABILIS (A)  Final   Report Status 08/26/2019 FINAL  Final   Organism ID, Bacteria PROTEUS MIRABILIS (A)  Final      Susceptibility   Proteus mirabilis - MIC*    AMPICILLIN <=2 SENSITIVE Sensitive     CEFAZOLIN <=4 SENSITIVE Sensitive     CEFTRIAXONE <=0.25 SENSITIVE Sensitive     CIPROFLOXACIN <=0.25 SENSITIVE Sensitive     GENTAMICIN <=1 SENSITIVE Sensitive     IMIPENEM 2 SENSITIVE Sensitive     NITROFURANTOIN 128 RESISTANT Resistant     TRIMETH/SULFA <=20 SENSITIVE Sensitive  AMPICILLIN/SULBACTAM <=2 SENSITIVE Sensitive     PIP/TAZO <=4 SENSITIVE Sensitive     * >=100,000 COLONIES/mL PROTEUS MIRABILIS  Respiratory Panel by RT PCR (Flu A&B, Covid) - Nasopharyngeal Swab     Status: None   Collection Time: 08/24/19  4:13 PM   Specimen: Nasopharyngeal Swab  Result Value Ref  Range Status   SARS Coronavirus 2 by RT PCR NEGATIVE NEGATIVE Final    Comment: (NOTE) SARS-CoV-2 target nucleic acids are NOT DETECTED. The SARS-CoV-2 RNA is generally detectable in upper respiratoy specimens during the acute phase of infection. The lowest concentration of SARS-CoV-2 viral copies this assay can detect is 131 copies/mL. A negative result does not preclude SARS-Cov-2 infection and should not be used as the sole basis for treatment or other patient management decisions. A negative result may occur with  improper specimen collection/handling, submission of specimen other than nasopharyngeal swab, presence of viral mutation(s) within the areas targeted by this assay, and inadequate number of viral copies (<131 copies/mL). A negative result must be combined with clinical observations, patient history, and epidemiological information. The expected result is Negative. Fact Sheet for Patients:  PinkCheek.be Fact Sheet for Healthcare Providers:  GravelBags.it This test is not yet ap proved or cleared by the [place] FDA and  has been authorized for detection and/or diagnosis of SARS-CoV-2 by FDA under an Emergency Use Authorization (EUA). This EUA will remain  in effect (meaning this test can be used) for the duration of the COVID-19 declaration under Section 564(b)(1) of the Act, 21 U.S.C. section 360bbb-3(b)(1), unless the authorization is terminated or revoked sooner.    Influenza A by PCR NEGATIVE NEGATIVE Final   Influenza B by PCR NEGATIVE NEGATIVE Final    Comment: (NOTE) The Xpert Xpress SARS-CoV-2/FLU/RSV assay is intended as an aid in  the diagnosis of influenza from Nasopharyngeal swab specimens and  should not be used as a sole basis for treatment. Nasal washings and  aspirates are unacceptable for Xpert Xpress SARS-CoV-2/FLU/RSV  testing. Fact Sheet for  Patients: PinkCheek.be Fact Sheet for Healthcare Providers: GravelBags.it This test is not yet approved or cleared by the [place] FDA and  has been authorized for detection and/or diagnosis of SARS-CoV-2 by  FDA under an Emergency Use Authorization (EUA). This EUA will remain  in effect (meaning this test can be used) for the duration of the  Covid-19 declaration under Section 564(b)(1) of the Act, 21  U.S.C. section 360bbb-3(b)(1), unless the authorization is  terminated or revoked. Performed at [facility], [address] [place], [place] [address]   MRSA PCR Screening     Status: Abnormal   Collection Time: 08/24/19  6:10 PM   Specimen: Nasopharyngeal  Result Value Ref Range Status   MRSA by PCR POSITIVE (A) NEGATIVE Final    Comment:        The GeneXpert MRSA Assay (FDA approved for NASAL specimens only), is one component of a comprehensive MRSA colonization surveillance program. It is not intended to diagnose MRSA infection nor to guide or monitor treatment for MRSA infections. RESULT CALLED TO, READ BACK BY AND VERIFIED WITH: KRISTY ADELOWO AT 1950 08/24/2019  TFK Performed at [facility], [address] [place], [place] [address]   CULTURE, BLOOD (ROUTINE X 2) w Reflex to ID Panel     Status: None (Preliminary result)   Collection Time: 08/24/19  6:16 PM   Specimen: BLOOD  Result Value Ref Range Status   Specimen Description BLOOD LEFT ANTECUBITAL  Final   Special Requests   Final    BOTTLES DRAWN AEROBIC AND ANAEROBIC Blood Culture adequate volume   Culture   Final    NO GROWTH 3 DAYS Performed at [facility], [address] [place], [place] [address]    Report Status PENDING  Incomplete  CULTURE, BLOOD (ROUTINE X 2) w Reflex to ID Panel     Status: None (Preliminary result)   Collection Time: 08/24/19  6:23 PM   Specimen: BLOOD  Result Value Ref Range Status    Specimen Description BLOOD BLOOD RIGHT HAND  Final   Special Requests   Final    BOTTLES DRAWN AEROBIC AND ANAEROBIC Blood Culture adequate volume   Culture   Final    NO GROWTH 3 DAYS Performed at [facility], [address], [place], [place] [address]    Report Status PENDING  Incomplete    Coagulation Studies: No results for input(s): LABPROT, INR in the last 72 hours.  Urinalysis: Recent Labs    08/24/19 1337  COLORURINE AMBER*  LABSPEC 1.014  PHURINE 9.0*  GLUCOSEU NEGATIVE  HGBUR NEGATIVE  BILIRUBINUR NEGATIVE  KETONESUR 5*  PROTEINUR 30*  NITRITE NEGATIVE  LEUKOCYTESUR TRACE*      Imaging: No results found.   Medications:   . sodium chloride (hypertonic) 25 mL/hr (08/27/19 0452)  . sodium chloride     . benzonatate  100 mg Oral TID  . cefdinir  300 mg Oral Q12H  . Chlorhexidine Gluconate Cloth  6 each Topical Q0600  . cyclobenzaprine  10 mg Oral TID  . dextromethorphan  30 mg Oral BID  . divalproex  500 mg Oral Q12H  . enoxaparin (LOVENOX) injection  40 mg Subcutaneous Q24H  . fluconazole  100 mg Oral Daily  . fluticasone  2 spray Each Nare Daily  . guaiFENesin  1,200 mg Oral BID  . influenza vac split quadrivalent PF  0.5 mL Intramuscular Once  . ipratropium  0.5 mg Nebulization QID  . levalbuterol  0.63 mg Nebulization QID  . montelukast  10 mg Oral QHS  . multivitamin with minerals  1 tablet Oral Daily  . mupirocin ointment   Nasal BID  . pantoprazole  40 mg Oral Daily  . polyethylene glycol  17 g Oral Daily  . risperiDONE  1 mg Oral BID  . senna-docusate  1 tablet Oral BID   acetaminophen **OR** acetaminophen, ALPRAZolam, guaiFENesin-codeine, hydrALAZINE, HYDROcodone-acetaminophen, loratadine, ondansetron **OR** ondansetron (ZOFRAN) IV  Assessment/ Plan:  59 y.o. female  Charlene [name] is a 59 y.o. 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   1.  Hyponatremia likely secondary to SIADH. 2.  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 3.  Hypokalemia. 4.  Pancytopenia with Anemia, leukopenia and thrombocytopenia.   Plan: - Continue 3% saline infusion.    LOS: 3 Charlene [name] 3/13/20219:57 AM

## 2019-08-27 NOTE — Consult Note (Addendum)
[place] for Sodium Monitoring Per Nephrology:  Hyponatremia likely secondary to SIADH  Recent Labs: Potassium (mmol/L)  Date Value  08/26/2019 4.3   Magnesium (mg/dL)  Date Value  08/26/2019 1.9   Calcium (mg/dL)  Date Value  08/26/2019 8.0 (L)   Albumin (g/dL)  Date Value  08/15/2019 3.3 (L)   Sodium (mmol/L)  Date Value  08/27/2019 129 (L)     Assessment: Pharmacy has been consulted to monitor sodium levels and follow up with provider.  Goal: Target Sodium of 136 on 3/13 per nephrology note  Plan:  03/13 @ 1713 Na 129. Last 24hr: 3 mmol increase. 0.3% hypertonic NACL drip running at 25 ml/hr. will continue to monitor w/ q4h Na checks. Nephrology following.  Pearla Dubonnet, PharmD Clinical Pharmacist 08/27/2019 6:17 PM

## 2019-08-27 NOTE — Consult Note (Addendum)
[place] for Sodium Monitoring Per Nephrology:  Hyponatremia likely secondary to SIADH  Recent Labs: Potassium (mmol/L)  Date Value  08/26/2019 4.3   Magnesium (mg/dL)  Date Value  08/26/2019 1.9   Calcium (mg/dL)  Date Value  08/26/2019 8.0 (L)   Albumin (g/dL)  Date Value  08/15/2019 3.3 (L)   Sodium (mmol/L)  Date Value  08/27/2019 127 (L)     Assessment: Pharmacy has been consulted to monitor sodium levels and follow up with provider.  Goal: Target Sodium of 136 on 3/13 per nephrology note  Plan:  03/13 @ 2135 Na 127. Last 24hr: 2 mmol increase. 0.3% hypertonic NACL drip running at 25 ml/hr. will continue to monitor w/ q4h Na checks. Nephrology following.  Pearla Dubonnet, PharmD Clinical Pharmacist 08/27/2019 10:06 PM

## 2019-08-27 NOTE — Consult Note (Signed)
[place] for Sodium Monitoring Per Nephrology:  Hyponatremia likely secondary to SIADH  Recent Labs: Potassium (mmol/L)  Date Value  08/26/2019 4.3   Magnesium (mg/dL)  Date Value  08/26/2019 1.9   Calcium (mg/dL)  Date Value  08/26/2019 8.0 (L)   Albumin (g/dL)  Date Value  08/15/2019 3.3 (L)   Sodium (mmol/L)  Date Value  08/27/2019 125 (L)     Assessment: Pharmacy has been consulted to monitor sodium levels and follow up with provider.  Goal: Target Sodium of 136 on 3/13 per nephrology note  Plan:  03/13 @ 0500 Na 125 . 0.3% hypertonic NACL drip running at 25 ml/hr. will continue to monitor w/ q4h Na checks. Nephrology following.  Chinita Greenland PharmD Clinical Pharmacist 08/27/2019

## 2019-08-27 NOTE — Progress Notes (Signed)
OT Cancellation Note  Patient Details Name: Charlene [name] MRN: [id number] DOB: 03-Oct-1960   Cancelled Treatment:    Reason Eval/Treat Not Completed: Medical issues which prohibited therapy(Pt.'s WBC is at a critical level 1.0 K/uL. Will continue to monitor and perform the initial OT visit when medically appropriate and treatment is no longer contraindicated.)  Harrel Carina, MS, OTR/L 08/27/2019, 4:04 PM

## 2019-08-27 NOTE — Progress Notes (Signed)
Pt has remained alert and oriented x 4 with c/o chronic lower back pain-> MD made aware-> PRN Percocet ordered. Pt has remained on 3LNC, SpO2 > 90%, lung sounds are diminished to auscultation. Pt has a strong, productive cough with thick, tan sputum. Pt has remained in ST on cardiac monitor-> HR reaching low 130s -> MD made aware-> EKG and PO Cardizem 30mg  q6 ordered.  Pt transferred from bed to chair x 2 assist. Pt is very weak with a flat effect. Pt Na+ has trended up slowly today-> recent lab Na+ 127.

## 2019-08-27 NOTE — Progress Notes (Signed)
Pt requesting flu shot. Pt is on chemo for stg IV lung cancer with WBC of 1.9. Consulted with hospitalist in regards to situation.  Vaccine being held at this time so hem/onc can decide if vaccination is appropriate at this time. Will notify dayshift RN for continued management.

## 2019-08-27 NOTE — Consult Note (Signed)
[place] for Sodium Monitoring Per Nephrology:  Hyponatremia likely secondary to SIADH  Recent Labs: Potassium (mmol/L)  Date Value  08/26/2019 4.3   Magnesium (mg/dL)  Date Value  08/26/2019 1.9   Calcium (mg/dL)  Date Value  08/26/2019 8.0 (L)   Albumin (g/dL)  Date Value  08/15/2019 3.3 (L)   Sodium (mmol/L)  Date Value  08/27/2019 127 (L)     Assessment: Pharmacy has been consulted to monitor sodium levels and follow up with provider.  Goal: Target Sodium of 136 on 3/13 per nephrology note  Plan:  03/13 @ 1323 Na 127 . 0.3% hypertonic NACL drip running at 25 ml/hr. will continue to monitor w/ q4h Na checks. Nephrology following.  Chinita Greenland PharmD Clinical Pharmacist 08/27/2019

## 2019-08-28 LAB — SODIUM
Sodium: 130 mmol/L — ABNORMAL LOW (ref 135–145)
Sodium: 128 mmol/L — ABNORMAL LOW (ref 135–145)
Sodium: 127 mmol/L — ABNORMAL LOW (ref 135–145)
Sodium: 127 mmol/L — ABNORMAL LOW (ref 135–145)
Sodium: 129 mmol/L — ABNORMAL LOW (ref 135–145)

## 2019-08-28 LAB — BASIC METABOLIC PANEL
Anion gap: 6 (ref 5–15)
BUN: 7 mg/dL (ref 6–20)
CO2: 29 mmol/L (ref 22–32)
Calcium: 8.3 mg/dL — ABNORMAL LOW (ref 8.9–10.3)
Chloride: 95 mmol/L — ABNORMAL LOW (ref 98–111)
Creatinine, Ser: 0.45 mg/dL (ref 0.44–1.00)
GFR calc Af Amer: >60 mL/min (ref >60)
GFR calc non Af Amer: >60 mL/min (ref >60)
Glucose, Bld: 102 mg/dL — ABNORMAL HIGH (ref 70–99)
Potassium: 3.9 mmol/L (ref 3.5–5.1)
Sodium: 130 mmol/L — ABNORMAL LOW (ref 135–145)

## 2019-08-28 LAB — MAGNESIUM: Magnesium: 2 mg/dL (ref 1.7–2.4)

## 2019-08-28 LAB — CBC
HCT: 26.1 % — ABNORMAL LOW (ref 36.0–46.0)
Hemoglobin: 8.7 g/dL — ABNORMAL LOW (ref 12.0–15.0)
MCH: 30.3 pg (ref 26.0–34.0)
MCHC: 33.3 g/dL (ref 30.0–36.0)
MCV: 90.9 fL (ref 80.0–100.0)
Platelets: 104 10*3/uL — ABNORMAL LOW (ref 150–400)
RBC: 2.87 MIL/uL — ABNORMAL LOW (ref 3.87–5.11)
RDW: 15.8 % — ABNORMAL HIGH (ref 11.5–15.5)
WBC: 1.5 10*3/uL — ABNORMAL LOW (ref 4.0–10.5)
nRBC: 0 % (ref 0.0–0.2)

## 2019-08-28 MED ORDER — ENSURE ENLIVE PO LIQD
237.0000 mL | Freq: Three times a day (TID) | ORAL | Status: DC
  Administered 2019-08-28 – 2019-08-30 (×7): 237 mL via ORAL

## 2019-08-28 MED ORDER — DILTIAZEM HCL ER COATED BEADS 120 MG PO CP24
120.0000 mg | ORAL_CAPSULE | Freq: Every day | ORAL | Status: DC
  Administered 2019-08-28 – 2019-08-30 (×3): 120 mg via ORAL
  Filled 2019-08-28 (×3): qty 1

## 2019-08-28 MED ORDER — IPRATROPIUM BROMIDE 0.02 % IN SOLN
0.5000 mg | Freq: Three times a day (TID) | RESPIRATORY_TRACT | Status: DC
  Administered 2019-08-28 – 2019-08-30 (×7): 0.5 mg via RESPIRATORY_TRACT
  Filled 2019-08-28 (×7): qty 2.5

## 2019-08-28 MED ORDER — SODIUM CHLORIDE 1 G PO TABS
2.0000 g | ORAL_TABLET | Freq: Three times a day (TID) | ORAL | Status: DC
  Administered 2019-08-28 – 2019-08-30 (×7): 2 g via ORAL
  Filled 2019-08-28 (×9): qty 2

## 2019-08-28 MED ORDER — LEVALBUTEROL HCL 0.63 MG/3ML IN NEBU
0.6300 mg | INHALATION_SOLUTION | Freq: Three times a day (TID) | RESPIRATORY_TRACT | Status: DC
  Administered 2019-08-28 – 2019-08-30 (×7): 0.63 mg via RESPIRATORY_TRACT
  Filled 2019-08-28 (×10): qty 3

## 2019-08-28 MED ORDER — LEVALBUTEROL HCL 1.25 MG/0.5ML IN NEBU
1.2500 mg | INHALATION_SOLUTION | Freq: Four times a day (QID) | RESPIRATORY_TRACT | Status: DC | PRN
  Filled 2019-08-28: qty 0.5

## 2019-08-28 MED ORDER — MAGNESIUM CITRATE PO SOLN
1.0000 | Freq: Once | ORAL | Status: AC
  Administered 2019-08-28: 11:00:00 1 via ORAL
  Filled 2019-08-28: qty 296

## 2019-08-28 NOTE — Progress Notes (Signed)
[facility]  ROUNDING NOTE   Subjective:   3% saline infusion at 37mL/hr. Na 130  Patient states she is feeling better.   Objective:  Vital signs in last 24 hours:  Temp:  [98.6 F (37 C)-98.7 F (37.1 C)] 98.7 F (37.1 C) (03/14 0340) Pulse Rate:  [94-131] 96 (03/14 0822) Resp:  [14-23] 16 (03/14 0822) BP: (101-125)/(54-79) 113/69 (03/14 0600) SpO2:  [92 %-100 %] 99 % (03/14 0822)  Weight change:  Filed Weights   08/24/19 1042 08/24/19 1806 08/27/19 0500  Weight: 93 kg 93.9 kg 93.9 kg    Intake/Output: I/O last 3 completed shifts: In: 2023.2 [P.O.:1160; I.V.:863.2] Out: 1800 [Urine:1800]   Intake/Output this shift:  No intake/output data recorded.  Physical Exam: General: No acute distress, laying in bed  Head: Normocephalic, atraumatic. Moist oral mucosal membranes  Eyes: Anicteric  Neck: Supple, trachea midline  Lungs:  +wheezing.   Heart: regular  Abdomen:  Soft, nontender, bowel sounds present  Extremities: + peripheral edema.  Neurologic: Awake, alert, conversant  Skin: No lesions       Basic Metabolic Panel: Recent Labs  Lab 08/24/19 0936 08/24/19 0936 08/24/19 1044 08/24/19 1044 08/24/19 1445 08/24/19 1700 08/25/19 0511 08/25/19 1837 08/26/19 0243 08/26/19 0841 08/27/19 1323 08/27/19 1713 08/27/19 2135 08/28/19 0120 08/28/19 0459  NA 111*   < > 112*   < > 112*   < > 120*   < > 128*   < > 127* 129* 127* 130* 130*  K 3.3*  --  3.1*  --   --   --  3.0*  --  4.3  --   --   --   --   --  3.9  CL 77*  --  75*  --   --   --  86*  --  97*  --   --   --   --   --  95*  CO2 23  --  25  --   --   --  27  --  25  --   --   --   --   --  29  GLUCOSE 122*  --  125*  --   --   --  104*  --  102*  --   --   --   --   --  102*  BUN 6  --  7  --   --   --  5*  --  9  --   --   --   --   --  7  CREATININE 0.34*  --  <0.30*  --   --   --  0.34*  --  0.51  --   --   --   --   --  0.45  CALCIUM 8.1*   < > 8.4*   < >  --   --  7.7*  --  8.0*  --    --   --   --   --  8.3*  MG  --   --   --   --  2.0  --   --   --  1.9  --   --   --   --   --  2.0   < > = values in this interval not displayed.    Liver Function Tests: No results for input(s): AST, ALT, ALKPHOS, BILITOT, PROT, ALBUMIN in the last 168 hours. No results for input(s): LIPASE, AMYLASE in the  last 168 hours. No results for input(s): AMMONIA in the last 168 hours.  CBC: Recent Labs  Lab 08/24/19 0936 08/24/19 1044 08/25/19 0511 08/26/19 0243 08/26/19 0841 08/27/19 0501 08/28/19 0459  WBC 14.8*   < > 5.3 1.8* 1.9* 1.0* 1.5*  NEUTROABS 13.5*  --   --   --  1.0*  --   --   HGB 11.2*   < > 9.9* 9.5* 9.6* 8.9* 8.7*  HCT 31.7*   < > 27.5* 28.7* 29.4* 27.3* 26.1*  MCV 85.2   < > 85.1 89.1 90.7 90.7 90.9  PLT 225   < > 166 148* 160 129* 104*   < > = values in this interval not displayed.    Cardiac Enzymes: No results for input(s): CKTOTAL, CKMB, CKMBINDEX, TROPONINI in the last 168 hours.  BNP: Invalid input(s): POCBNP  CBG: Recent Labs  Lab 08/24/19 1807  GLUCAP 151*    Microbiology: Results for orders placed or performed during the hospital encounter of 08/24/19  SARS CORONAVIRUS 2 (TAT 6-24 HRS) Nasopharyngeal Nasopharyngeal Swab     Status: None   Collection Time: 08/24/19  1:37 PM   Specimen: Nasopharyngeal Swab  Result Value Ref Range Status   SARS Coronavirus 2 NEGATIVE NEGATIVE Final    Comment: (NOTE) SARS-CoV-2 target nucleic acids are NOT DETECTED. The SARS-CoV-2 RNA is generally detectable in upper and lower respiratory specimens during the acute phase of infection. Negative results do not preclude SARS-CoV-2 infection, do not rule out co-infections with other pathogens, and should not be used as the sole basis for treatment or other patient management decisions. Negative results must be combined with clinical observations, patient history, and epidemiological information. The expected result is Negative. Fact Sheet for  Patients: SugarRoll.be Fact Sheet for Healthcare Providers: https://www.woods-mathews.com/ This test is not yet approved or cleared by the [place] FDA and  has been authorized for detection and/or diagnosis of SARS-CoV-2 by FDA under an Emergency Use Authorization (EUA). This EUA will remain  in effect (meaning this test can be used) for the duration of the COVID-19 declaration under Section 56 4(b)(1) of the Act, 21 U.S.C. section 360bbb-3(b)(1), unless the authorization is terminated or revoked sooner. Performed at [facility], [place] [address] [place], [place] [address]   Urine Culture     Status: Abnormal   Collection Time: 08/24/19  1:37 PM   Specimen: Urine, Random  Result Value Ref Range Status   Specimen Description   Final    URINE, RANDOM Performed at [facility], [address] [place], [place] [address]    Special Requests   Final    NONE Performed at [facility], [address] [place], [place] [address]    Culture >=100,000 COLONIES/mL PROTEUS MIRABILIS (A)  Final   Report Status 08/26/2019 FINAL  Final   Organism ID, Bacteria PROTEUS MIRABILIS (A)  Final      Susceptibility   Proteus mirabilis - MIC*    AMPICILLIN <=2 SENSITIVE Sensitive     CEFAZOLIN <=4 SENSITIVE Sensitive     CEFTRIAXONE <=0.25 SENSITIVE Sensitive     CIPROFLOXACIN <=0.25 SENSITIVE Sensitive     GENTAMICIN <=1 SENSITIVE Sensitive     IMIPENEM 2 SENSITIVE Sensitive     NITROFURANTOIN 128 RESISTANT Resistant     TRIMETH/SULFA <=20 SENSITIVE Sensitive     AMPICILLIN/SULBACTAM <=2 SENSITIVE Sensitive     PIP/TAZO <=4 SENSITIVE Sensitive     * >=100,000 COLONIES/mL PROTEUS MIRABILIS  Respiratory Panel  by RT PCR (Flu A&B, Covid) - Nasopharyngeal Swab     Status: None   Collection Time: 08/24/19  4:13 PM   Specimen: Nasopharyngeal Swab  Result Value Ref Range Status   SARS Coronavirus 2 by RT PCR NEGATIVE NEGATIVE  Final    Comment: (NOTE) SARS-CoV-2 target nucleic acids are NOT DETECTED. The SARS-CoV-2 RNA is generally detectable in upper respiratoy specimens during the acute phase of infection. The lowest concentration of SARS-CoV-2 viral copies this assay can detect is 131 copies/mL. A negative result does not preclude SARS-Cov-2 infection and should not be used as the sole basis for treatment or other patient management decisions. A negative result may occur with  improper specimen collection/handling, submission of specimen other than nasopharyngeal swab, presence of viral mutation(s) within the areas targeted by this assay, and inadequate number of viral copies (<131 copies/mL). A negative result must be combined with clinical observations, patient history, and epidemiological information. The expected result is Negative. Fact Sheet for Patients:  PinkCheek.be Fact Sheet for Healthcare Providers:  GravelBags.it This test is not yet ap proved or cleared by the [place] FDA and  has been authorized for detection and/or diagnosis of SARS-CoV-2 by FDA under an Emergency Use Authorization (EUA). This EUA will remain  in effect (meaning this test can be used) for the duration of the COVID-19 declaration under Section 564(b)(1) of the Act, 21 U.S.C. section 360bbb-3(b)(1), unless the authorization is terminated or revoked sooner.    Influenza A by PCR NEGATIVE NEGATIVE Final   Influenza B by PCR NEGATIVE NEGATIVE Final    Comment: (NOTE) The Xpert Xpress SARS-CoV-2/FLU/RSV assay is intended as an aid in  the diagnosis of influenza from Nasopharyngeal swab specimens and  should not be used as a sole basis for treatment. Nasal washings and  aspirates are unacceptable for Xpert Xpress SARS-CoV-2/FLU/RSV  testing. Fact Sheet for Patients: PinkCheek.be Fact Sheet for Healthcare  Providers: GravelBags.it This test is not yet approved or cleared by the [place] FDA and  has been authorized for detection and/or diagnosis of SARS-CoV-2 by  FDA under an Emergency Use Authorization (EUA). This EUA will remain  in effect (meaning this test can be used) for the duration of the  Covid-19 declaration under Section 564(b)(1) of the Act, 21  U.S.C. section 360bbb-3(b)(1), unless the authorization is  terminated or revoked. Performed at [facility], [address] [place], [place] [address]   MRSA PCR Screening     Status: Abnormal   Collection Time: 08/24/19  6:10 PM   Specimen: Nasopharyngeal  Result Value Ref Range Status   MRSA by PCR POSITIVE (A) NEGATIVE Final    Comment:        The GeneXpert MRSA Assay (FDA approved for NASAL specimens only), is one component of a comprehensive MRSA colonization surveillance program. It is not intended to diagnose MRSA infection nor to guide or monitor treatment for MRSA infections. RESULT CALLED TO, READ BACK BY AND VERIFIED WITH: KRISTY ADELOWO AT 1950 08/24/2019  TFK Performed at [facility], [address] [place], [place] [address]   CULTURE, BLOOD (ROUTINE X 2) w Reflex to ID Panel     Status: None (Preliminary result)   Collection Time: 08/24/19  6:16 PM   Specimen: BLOOD  Result Value Ref Range Status   Specimen Description BLOOD LEFT ANTECUBITAL  Final   Special Requests   Final    BOTTLES DRAWN AEROBIC AND ANAEROBIC Blood Culture adequate volume   Culture  Final    NO GROWTH 4 DAYS Performed at [facility], [address] [place], [place] [address]    Report Status PENDING  Incomplete  CULTURE, BLOOD (ROUTINE X 2) w Reflex to ID Panel     Status: None (Preliminary result)   Collection Time: 08/24/19  6:23 PM   Specimen: BLOOD  Result Value Ref Range Status   Specimen Description BLOOD BLOOD RIGHT HAND  Final   Special Requests   Final     BOTTLES DRAWN AEROBIC AND ANAEROBIC Blood Culture adequate volume   Culture   Final    NO GROWTH 4 DAYS Performed at [facility], [address], [place], [place] [address]    Report Status PENDING  Incomplete    Coagulation Studies: No results for input(s): LABPROT, INR in the last 72 hours.  Urinalysis: No results for input(s): COLORURINE, LABSPEC, PHURINE, GLUCOSEU, HGBUR, BILIRUBINUR, KETONESUR, PROTEINUR, UROBILINOGEN, NITRITE, LEUKOCYTESUR in the last 72 hours.  Invalid input(s): APPERANCEUR    Imaging: DG Chest Port 1 View  Result Date: 08/27/2019 CLINICAL DATA:  SOB, constipation. Patient admitted on 08/24/2019 for hyponatremia. Patient currently undergoing treatment for lung cancer and liver cancer per patient. Hx of asthma, COPD, HTN, appendectomy, stomach surgery, abdominal hysterectomy, former smoker. EXAM: PORTABLE CHEST 1 VIEW COMPARISON:  03/08/2018.  Chest CT, 08/05/2019. FINDINGS: Ill-defined right hilar mass is similar to the recent prior chest CT, increased when compared to the prior chest radiograph. Linear/reticular opacities extend from the right hilar mass, also similar to the prior chest CT. Mild opacity at the left lung base is consistent with atelectasis. No convincing pneumonia. No pulmonary edema. No pleural effusion or pneumothorax. Cardiac silhouette is normal in size.  Normal left hilar contour. Right anterior chest wall Port-A-Cath is stable from the prior chest CT. Skeletal structures are grossly intact. IMPRESSION: 1. No acute cardiopulmonary disease. 2. Changes on the right reflect the patient's known lung carcinoma, similar in appearance to the recent prior chest CT. No convincing pneumonia and no pulmonary edema. Electronically Signed   By: Lajean Manes M.D.   On: 08/27/2019 11:04   DG Abd Portable 1V  Result Date: 08/27/2019 CLINICAL DATA:  SOB, constipation. Patient admitted on 08/24/2019 for hyponatremia. Patient currently undergoing  treatment for lung cancer and liver cancer per patient. Hx of asthma, COPD, HTN, appendectomy, stomach surgery, abdominal hysterectomy, former smoker. EXAM: PORTABLE ABDOMEN - 1 VIEW COMPARISON:  CT, 01/25/2016 FINDINGS: Normal bowel gas pattern.  Mild increased right colon stool burden. No evidence of renal or ureteral stones. Soft tissues are unremarkable. Status post right hip pinning.  No acute skeletal abnormality. IMPRESSION: 1. No acute findings.  No evidence of bowel obstruction. 2. Mild increased right colon stool burden. Electronically Signed   By: Lajean Manes M.D.   On: 08/27/2019 11:06     Medications:   . sodium chloride (hypertonic) 25 mL/hr at 08/28/19 0600  . sodium chloride     . benzonatate  100 mg Oral TID  . cefdinir  300 mg Oral Q12H  . Chlorhexidine Gluconate Cloth  6 each Topical Q0600  . cyclobenzaprine  10 mg Oral TID  . dextromethorphan  30 mg Oral BID  . diltiazem  120 mg Oral Daily  . divalproex  500 mg Oral Q12H  . enoxaparin (LOVENOX) injection  40 mg Subcutaneous Q24H  . fluconazole  100 mg Oral Daily  . fluticasone  2 spray Each Nare Daily  . guaiFENesin  1,200 mg Oral BID  .  influenza vac split quadrivalent PF  0.5 mL Intramuscular Once  . ipratropium  0.5 mg Nebulization QID  . levalbuterol  0.63 mg Nebulization QID  . magnesium citrate  1 Bottle Oral Once  . montelukast  10 mg Oral QHS  . multivitamin with minerals  1 tablet Oral Daily  . mupirocin ointment   Nasal BID  . pantoprazole  40 mg Oral Daily  . polyethylene glycol  17 g Oral Daily  . risperiDONE  1 mg Oral BID  . senna-docusate  1 tablet Oral BID   acetaminophen **OR** acetaminophen, ALPRAZolam, guaiFENesin-codeine, hydrALAZINE, loratadine, ondansetron **OR** ondansetron (ZOFRAN) IV, oxyCODONE-acetaminophen  Assessment/ Plan:  59 y.o. female  Charlene [name] is a 59 y.o. 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   1.  Hyponatremia likely secondary to SIADH. 2.  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 3.  Hypokalemia. 4.  Pancytopenia with Anemia, leukopenia and thrombocytopenia.   Plan: - Discontinue 3% saline infusion.  - Continue fluid restriction - if sodium drops, will place on PO sodium chloride    LOS: 4 Sequan Auxier 3/14/20219:52 AM

## 2019-08-28 NOTE — Consult Note (Signed)
[place] for Sodium Monitoring Per Nephrology:  Hyponatremia likely secondary to SIADH  Recent Labs: Potassium (mmol/L)  Date Value  08/28/2019 3.9   Magnesium (mg/dL)  Date Value  08/28/2019 2.0   Calcium (mg/dL)  Date Value  08/28/2019 8.3 (L)   Albumin (g/dL)  Date Value  08/15/2019 3.3 (L)   Sodium (mmol/L)  Date Value  08/28/2019 130 (L)     Assessment: Pharmacy has been consulted to monitor sodium levels and follow up with provider.  Goal: Target Sodium of 136 on 3/13 per nephrology note  Plan:  03/14 @ 0420 Na 130 trending appropriately, will continue to monitor w/ q4h Na checks, currently running at 25 ml/hr.  Tobie Lords, PharmD Clinical Pharmacist 08/28/2019 7:18 AM

## 2019-08-28 NOTE — Progress Notes (Signed)
OT Cancellation Note  Patient Details Name: ENES WEGENER MRN: [id number] DOB: 31-Oct-1960   Cancelled Treatment:    Reason Eval/Treat Not Completed: Medical issues which prohibited therapy Pt.'s WBC is at a critical level 1.5 K/uL. Per Therapy guidelines, will continue to monitor and perform evaluation when medically appropriate and treatment is no longer contraindicated. Dessie Coma, M.S. OTR/L  08/28/19, 2:35 PM

## 2019-08-28 NOTE — Progress Notes (Signed)
[facility] Progress Note  Patient: Charlene [name]    VOH:[id number]  DOA: 08/24/2019     Date of Service: the patient was seen and examined on 08/28/2019  Chief Complaint  Patient presents with  . Weakness  . Abnormal Lab   Brief hospital course: Charlene [name] is a 59 y.o. 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  Pt was in cancer center for labs drawn before plan to have chemotherapy today. She was found to have hyponatremia with sodium of 111.  Patient states that she has generalized weakness and dizziness.  No confusion or seizure. 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  Hyponatremia secondary to SIADH Continue stepdown unit. Initially 3% saline per nephrology. Now on salt tablets. Continue fluid restriction. Avoid overcorrection.  2.  Right lower lobe small cell lung cancer stage IV. History of COPD. Chronic cough On chemotherapy outpatient. Last dose was 08/19/2019. Follow-up with oncology. Currently on fluconazole which continue. Continues to have shortness of breath seen. Change to bronchodilators and cough medicines.  3.  Hypokalemia Repleted.  Monitor.  4.  Proteus UTI Currently on IV Rocephin.  Urine culture growing Proteus.  Will transitioning to oral Omnicef..  5.  Essential hypertension Blood pressure controlled. Was hypotensive this morning.  Had to hold Cardizem. Given her normal blood pressure after bolus continue Cardizem.  Monitor.  6.  GERD Continue PPI.  7.  Tachycardia. Patient is on Cardizem at home. Due to hypotension Cardizem was on hold. We will resume Cardizem  8.  Cancer-related pain. Patient is on Norco. Changing to Percocet given her uncontrolled pain.  9.  Anxiety. Added Xanax.  10 constipation. Bowel regimen  adjusted as well. X-ray shows no evidence of obstruction. Mag citrate ordered.  Patient currently not uncomfortable  Body mass index is 31.48 kg/m.   Diet: Regular diet DVT Prophylaxis: Subcutaneous Heparin    Advance goals of care discussion: Full code  Family Communication: no family was present at bedside, at the time of interview.   Disposition:  Pt is from home, admitted with abnormal lab with hyponatremia, still has severe hyponatremia, which precludes a safe discharge. Discharge to home, when medically stable.  Subjective: Cough improving.  Breathing improving.  Back pain under control as well.  No nausea or vomiting.  No abdominal pain.  Passing gas.  Still constipated.  Physical Exam: General:  alert oriented to time, place, and person.  Appear in mild distress, affect appropriate Eyes: PERRL ENT: Oral Mucosa Clear, moist  Neck: no JVD,  Cardiovascular: S1 and S2 Present, no Murmur,  Respiratory: good respiratory effort, Bilateral Air entry equal and Decreased, no Crackles, bilateral  wheezes Abdomen: Bowel Sound present, Soft and no tenderness,  Skin: no rash Extremities: Trace pedal edema, no calf tenderness Neurologic: without any new focal findings Gait not checked due to patient safety concerns  Vitals:   08/28/19 1100 08/28/19 1200 08/28/19 1310 08/28/19 1355  BP: 111/64 113/72 112/70   Pulse: 98 (!) 106 (!) 103   Resp: 15 (!) 22 18   Temp:   98.1 F (36.7 C)   TempSrc:   Oral   SpO2:   100% 97%  Weight:      Height:        Intake/Output Summary (Last 24 hours) at 08/28/2019 1911 Last data filed  at 08/28/2019 1100 Gross per 24 hour  Intake 272.55 ml  Output 325 ml  Net -52.45 ml   Filed Weights   08/24/19 1042 08/24/19 1806 08/27/19 0500  Weight: 93 kg 93.9 kg 93.9 kg    Data Reviewed: I have personally reviewed and interpreted daily labs, tele strips, imagings as discussed above. I reviewed all nursing notes, pharmacy notes, vitals, pertinent  old records I have discussed plan of care as described above with RN and patient/family.  CBC: Recent Labs  Lab 08/24/19 0936 08/24/19 1044 08/25/19 0511 08/26/19 0243 08/26/19 0841 08/27/19 0501 08/28/19 0459  WBC 14.8*   < > 5.3 1.8* 1.9* 1.0* 1.5*  NEUTROABS 13.5*  --   --   --  1.0*  --   --   HGB 11.2*   < > 9.9* 9.5* 9.6* 8.9* 8.7*  HCT 31.7*   < > 27.5* 28.7* 29.4* 27.3* 26.1*  MCV 85.2   < > 85.1 89.1 90.7 90.7 90.9  PLT 225   < > 166 148* 160 129* 104*   < > = values in this interval not displayed.   Basic Metabolic Panel: Recent Labs  Lab 08/24/19 0936 08/24/19 0936 08/24/19 1044 08/24/19 1044 08/24/19 1445 08/24/19 1700 08/25/19 0511 08/25/19 1837 08/26/19 0243 08/26/19 0841 08/28/19 0120 08/28/19 0459 08/28/19 0940 08/28/19 1322 08/28/19 1734  NA 111*   < > 112*   < > 112*   < > 120*   < > 128*   < > 130* 130* 128* 127* 127*  K 3.3*  --  3.1*  --   --   --  3.0*  --  4.3  --   --  3.9  --   --   --   CL 77*  --  75*  --   --   --  86*  --  97*  --   --  95*  --   --   --   CO2 23  --  25  --   --   --  27  --  25  --   --  29  --   --   --   GLUCOSE 122*  --  125*  --   --   --  104*  --  102*  --   --  102*  --   --   --   BUN 6  --  7  --   --   --  5*  --  9  --   --  7  --   --   --   CREATININE 0.34*  --  <0.30*  --   --   --  0.34*  --  0.51  --   --  0.45  --   --   --   CALCIUM 8.1*  --  8.4*  --   --   --  7.7*  --  8.0*  --   --  8.3*  --   --   --   MG  --   --   --   --  2.0  --   --   --  1.9  --   --  2.0  --   --   --    < > = values in this interval not displayed.    Studies: No results found.  Scheduled Meds: . benzonatate  100 mg Oral TID  . cefdinir  300 mg Oral Q12H  .  Chlorhexidine Gluconate Cloth  6 each Topical Q0600  . cyclobenzaprine  10 mg Oral TID  . dextromethorphan  30 mg Oral BID  . diltiazem  120 mg Oral Daily  . divalproex  500 mg Oral Q12H  . enoxaparin (LOVENOX) injection  40 mg Subcutaneous Q24H  . feeding  supplement (ENSURE ENLIVE)  237 mL Oral TID BM  . fluconazole  100 mg Oral Daily  . fluticasone  2 spray Each Nare Daily  . guaiFENesin  1,200 mg Oral BID  . influenza vac split quadrivalent PF  0.5 mL Intramuscular Once  . ipratropium  0.5 mg Nebulization TID  . levalbuterol  0.63 mg Nebulization TID  . montelukast  10 mg Oral QHS  . multivitamin with minerals  1 tablet Oral Daily  . mupirocin ointment   Nasal BID  . pantoprazole  40 mg Oral Daily  . polyethylene glycol  17 g Oral Daily  . risperiDONE  1 mg Oral BID  . senna-docusate  1 tablet Oral BID  . sodium chloride  2 g Oral TID WC   Continuous Infusions:  PRN Meds: acetaminophen **OR** acetaminophen, ALPRAZolam, guaiFENesin-codeine, hydrALAZINE, levalbuterol, loratadine, ondansetron **OR** ondansetron (ZOFRAN) IV, oxyCODONE-acetaminophen  Time spent: 35 minutes  Author: Berle Mull, MD [facility] 08/28/2019 7:11 PM  To reach On-call, see care teams to locate the attending and reach out to them via [email or web address]. If 7PM-7AM, please contact night-coverage If you still have difficulty reaching the attending provider, please page the [facility] (Director on Call) for [facility] on amion for assistance.

## 2019-08-28 NOTE — Consult Note (Signed)
[place] for Sodium Monitoring Per Nephrology:  Hyponatremia likely secondary to SIADH  Recent Labs: Potassium (mmol/L)  Date Value  08/26/2019 4.3   Magnesium (mg/dL)  Date Value  08/26/2019 1.9   Calcium (mg/dL)  Date Value  08/26/2019 8.0 (L)   Albumin (g/dL)  Date Value  08/15/2019 3.3 (L)   Sodium (mmol/L)  Date Value  08/27/2019 127 (L)     Assessment: Pharmacy has been consulted to monitor sodium levels and follow up with provider.  Goal: Target Sodium of 136 on 3/13 per nephrology note  Plan:  03/13 @ 2130 Na 127 trending appropriately, will continue to monitor w/ q4h Na checks, currently running at 25 ml/hr.  Tobie Lords, PharmD Clinical Pharmacist 08/28/2019 1:36 AM

## 2019-08-28 NOTE — Consult Note (Signed)
[place] for Sodium Monitoring Per Nephrology:  Hyponatremia likely secondary to SIADH  Recent Labs: Potassium (mmol/L)  Date Value  08/26/2019 4.3   Magnesium (mg/dL)  Date Value  08/26/2019 1.9   Calcium (mg/dL)  Date Value  08/26/2019 8.0 (L)   Albumin (g/dL)  Date Value  08/15/2019 3.3 (L)   Sodium (mmol/L)  Date Value  08/28/2019 130 (L)     Assessment: Pharmacy has been consulted to monitor sodium levels and follow up with provider.  Goal: Target Sodium of 136 on 3/13 per nephrology note  Plan:  03/14 @ 0120 Na 130 trending appropriately, will continue to monitor w/ q4h Na checks, currently running at 25 ml/hr.  Tobie Lords, PharmD Clinical Pharmacist 08/28/2019 3:37 AM

## 2019-08-29 ENCOUNTER — Inpatient Hospital Stay: Payer: Medicare Other

## 2019-08-29 ENCOUNTER — Inpatient Hospital Stay: Payer: Medicare Other | Admitting: Nurse Practitioner

## 2019-08-29 LAB — CBC WITH DIFFERENTIAL/PLATELET
Abs Immature Granulocytes: 0.25 10*3/uL — ABNORMAL HIGH (ref 0.00–0.07)
Basophils Absolute: 0 10*3/uL (ref 0.0–0.1)
Basophils Relative: 1 %
Eosinophils Absolute: 0.1 10*3/uL (ref 0.0–0.5)
Eosinophils Relative: 4 %
HCT: 27.1 % — ABNORMAL LOW (ref 36.0–46.0)
Hemoglobin: 8.6 g/dL — ABNORMAL LOW (ref 12.0–15.0)
Immature Granulocytes: 7 %
Lymphocytes Relative: 29 %
Lymphs Abs: 1 10*3/uL (ref 0.7–4.0)
MCH: 29.8 pg (ref 26.0–34.0)
MCHC: 31.7 g/dL (ref 30.0–36.0)
MCV: 93.8 fL (ref 80.0–100.0)
Monocytes Absolute: 0.5 10*3/uL (ref 0.1–1.0)
Monocytes Relative: 15 %
Neutro Abs: 1.6 10*3/uL — ABNORMAL LOW (ref 1.7–7.7)
Neutrophils Relative %: 44 %
Platelets: 83 10*3/uL — ABNORMAL LOW (ref 150–400)
RBC: 2.89 MIL/uL — ABNORMAL LOW (ref 3.87–5.11)
RDW: 16.1 % — ABNORMAL HIGH (ref 11.5–15.5)
Smear Review: NORMAL
WBC: 3.5 10*3/uL — ABNORMAL LOW (ref 4.0–10.5)
nRBC: 0 % (ref 0.0–0.2)

## 2019-08-29 LAB — CULTURE, BLOOD (ROUTINE X 2)
Culture: NO GROWTH
Culture: NO GROWTH
Special Requests: ADEQUATE
Special Requests: ADEQUATE

## 2019-08-29 LAB — BASIC METABOLIC PANEL
Anion gap: 8 (ref 5–15)
BUN: 9 mg/dL (ref 6–20)
CO2: 31 mmol/L (ref 22–32)
Calcium: 8.5 mg/dL — ABNORMAL LOW (ref 8.9–10.3)
Chloride: 91 mmol/L — ABNORMAL LOW (ref 98–111)
Creatinine, Ser: 0.53 mg/dL (ref 0.44–1.00)
GFR calc Af Amer: >60 mL/min (ref >60)
GFR calc non Af Amer: >60 mL/min (ref >60)
Glucose, Bld: 92 mg/dL (ref 70–99)
Potassium: 4.2 mmol/L (ref 3.5–5.1)
Sodium: 130 mmol/L — ABNORMAL LOW (ref 135–145)

## 2019-08-29 LAB — SODIUM: Sodium: 128 mmol/L — ABNORMAL LOW (ref 135–145)

## 2019-08-29 LAB — MAGNESIUM: Magnesium: 2.1 mg/dL (ref 1.7–2.4)

## 2019-08-29 LAB — VALPROIC ACID LEVEL: Valproic Acid Lvl: 66 ug/mL (ref 50.0–100.0)

## 2019-08-29 LAB — AMMONIA: Ammonia: 41 umol/L — ABNORMAL HIGH (ref 9–35)

## 2019-08-29 MED ORDER — MENTHOL 3 MG MT LOZG
1.0000 | LOZENGE | OROMUCOSAL | Status: DC | PRN
  Filled 2019-08-29: qty 9

## 2019-08-29 MED ORDER — MECLIZINE HCL 12.5 MG PO TABS
12.5000 mg | ORAL_TABLET | Freq: Three times a day (TID) | ORAL | Status: DC | PRN
  Filled 2019-08-29: qty 1

## 2019-08-29 MED ORDER — HYDROCOD POLST-CPM POLST ER 10-8 MG/5ML PO SUER
5.0000 mL | Freq: Two times a day (BID) | ORAL | Status: DC
  Administered 2019-08-29 – 2019-08-30 (×3): 5 mL via ORAL
  Filled 2019-08-29 (×3): qty 5

## 2019-08-29 NOTE — Evaluation (Signed)
Occupational Therapy Evaluation Patient Details Name: Charlene [name] MRN: [id number] DOB: [birth year] Today's Date: 08/29/2019    History of Present Illness Charlene [name] is a 59 y/o F who comes to [facility] 3/10 lightheadedness, dizziness, and hyponatremia (Na+: 111).  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 At time of evaluation, Na+: corrected.   Clinical Impression   Pt was seen for OT evaluation this date. Prior to hospital admission, pt was Indep with most ADLs (occasional assist for bathing/dressing from daughter) and ADL mobility with no AD. Pt lives in [facility] with her parents and brother with level entry. Currently pt demonstrates impairments as described below (See OT problem list) which functionally limit her ability to perform ADL/self-care tasks. Pt currently requires MIN A for ADL transfers, MIN/CGA for ADL mobility with RW, MOD A for toileting and MOD A for LB ADLs in sitting.  Pt would benefit from skilled OT to address noted impairments and functional limitations (see below for any additional details) in order to maximize safety and independence while minimizing falls risk and caregiver burden. Upon hospital discharge, recommend HHOT to maximize pt safety and return to functional independence during meaningful occupations of daily life.      Follow Up Recommendations  Home health OT;Supervision - Intermittent(supv for OOB/OOC, ADL t/f's, standing activity)    Equipment Recommendations  3 in 1 bedside commode    Recommendations for Other Services       Precautions / Restrictions Precautions Precautions: Fall Restrictions Weight Bearing Restrictions: No      Mobility Bed Mobility               General bed mobility comments: deferred, pt up in restroom with RN when OT presents  Transfers Overall transfer level: Needs assistance Equipment used: Rolling walker (2 wheeled) Transfers: Sit to/from  Stand Sit to Stand: Min assist         General transfer comment: requires extended time to come up from commode. MIN verbal cues for safe hand placement with RW for sit to stand from commode and stand to sit to recliner.    Balance Overall balance assessment: Needs assistance Sitting-balance support: Single extremity supported;Feet supported;Feet unsupported Sitting balance-Leahy Scale: Fair     Standing balance support: Bilateral upper extremity supported;During functional activity Standing balance-Leahy Scale: Fair Standing balance comment: F static standing with B UE support on RW, P balance for more dynamic reaching                           ADL either performed or assessed with clinical judgement   ADL Overall ADL's : Needs assistance/impaired Eating/Feeding: Set up;Sitting   Grooming: Wash/dry hands;Min guard;Standing Grooming Details (indicate cue type and reason): with RW sink-side with one cue to initiate         Upper Body Dressing : Minimal assistance;Sitting   Lower Body Dressing: Moderate assistance;Sit to/from stand   Toilet Transfer: Minimal assistance;Grab bars;RW   Toileting- Clothing Manipulation and Hygiene: Moderate assistance;Maximal assistance;Sit to/from stand       Functional mobility during ADLs: Min guard;Minimal assistance(with RW to perform fxl mobility from restroom to recliner)       Vision Patient Visual Report: No change from baseline       Perception     Praxis      Pertinent Vitals/Pain Pain Assessment: Faces Faces Pain Scale: Hurts even more Pain Location: R hip, lower back Pain  Descriptors / Indicators: Aching Pain Intervention(s): Limited activity within patient's tolerance;Monitored during session;Repositioned(Pt left sitting up in recliner with chair alarm versus back to bed at end of session.)     Hand Dominance Right   Extremity/Trunk Assessment Upper Extremity Assessment Upper Extremity Assessment:  Generalized weakness(moves through full arc of motion, grossly 4-/5 bilaterally)   Lower Extremity Assessment Lower Extremity Assessment: Defer to PT evaluation;Generalized weakness   Cervical / Trunk Assessment Cervical / Trunk Assessment: (shoulders somewhat forward/hunched)   Communication Communication Communication: No difficulties   Cognition Arousal/Alertness: Awake/alert Behavior During Therapy: WFL for tasks assessed/performed Overall Cognitive Status: Within Functional Limits for tasks assessed                                     General Comments       Exercises Other Exercises Other Exercises: OT facilitates education re: role of OT in acute setting and pt verbalized appropriate understanding. Other Exercises: OT facilitates education re: safe hand placement relative to RW for ADL transfers. Pt demos moderate understanding, reinforcement required for safety.   Shoulder Instructions      Home Living Family/patient expects to be discharged to:: Private residence Living Arrangements: Parent;Other relatives(parents and brother) Available Help at Discharge: Family Type of Home: House Home Access: Level entry     Home Layout: One level     Bathroom Shower/Tub: Teacher, early years/pre: Standard     Home Equipment: Tub bench;Walker - 4 wheels   Additional Comments: Used a RW ~1.5 yrs ago d/t fall with hip fracture s/p ORIF (denies other falls)      Prior Functioning/Environment Level of Independence: Needs assistance  Gait / Transfers Assistance Needed: Indep, no AD ADL's / Homemaking Assistance Needed: Reports that her father typically does the driving and gets groceries. Reports her brother works during the day here at [facility]. Reports that her adult daughter helps some with bathing/dressing when pt is having tough day.            OT Problem List: Decreased strength;Decreased activity tolerance;Impaired balance (sitting and/or  standing);Decreased knowledge of use of DME or AE;Decreased knowledge of precautions;Cardiopulmonary status limiting activity;Pain      OT Treatment/Interventions: Self-care/ADL training;Therapeutic exercise;Energy conservation;DME and/or AE instruction;Therapeutic activities;Patient/family education;Balance training    OT Goals(Current goals can be found in the care plan section) Acute Rehab OT Goals Patient Stated Goal: regain strength and balance OT Goal Formulation: With patient Time For Goal Achievement: 09/12/19 Potential to Achieve Goals: Good  OT Frequency: Min 2X/week   Barriers to D/C:            Co-evaluation              AM-PAC OT "6 Clicks" Daily Activity     Outcome Measure Help from another person eating meals?: None Help from another person taking care of personal grooming?: A Little Help from another person toileting, which includes using toliet, bedpan, or urinal?: A Lot Help from another person bathing (including washing, rinsing, drying)?: A Lot Help from another person to put on and taking off regular upper body clothing?: A Little Help from another person to put on and taking off regular lower body clothing?: A Lot 6 Click Score: 16   End of Session Equipment Utilized During Treatment: Gait belt;Rolling walker  Activity Tolerance: Patient tolerated treatment well Patient left: in chair;with call bell/phone within reach;with chair alarm set  OT Visit Diagnosis: Unsteadiness on feet (R26.81);Muscle weakness (generalized) (M62.81)                Time: 8657-8469 OT Time Calculation (min): 26 min Charges:  OT General Charges $OT Visit: 1 Visit OT Evaluation $OT Eval Moderate Complexity: 1 Mod OT Treatments $Self Care/Home Management : 8-22 mins  Gerrianne Scale, MS, OTR/L ascom [phone] 08/29/19, 1:56 PM

## 2019-08-29 NOTE — Progress Notes (Signed)
Physical Therapy Treatment Patient Details Name: Charlene [name] MRN: [id number] DOB: 10/20/1960 Today's Date: 08/29/2019    History of Present Illness Charlene [name] is a 59 y/o F who comes to [facility] 3/10 lightheadedness, dizziness, and hyponatremia (Na+: 111).  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 At time of evaluation, Na+: corrected.    PT Comments    Pt is making improved progress towards goals with ability to ambulate in room to bathroom and back. Still becomes fatigued and dizzy. Orthostatics obtained. Supine: 106/67; Seated: 106/61; Standing: 107/52. Pt able to perform seated there-ex and reports she will have sufficient assistance in home environment. All mobility performed on RA with O2 sats decreasing to 84%, 2.5L of O2 donned once seated with O2 sats quickly improving to 90%. Will continue to progress as able.  Follow Up Recommendations  Home health PT;Supervision - Intermittent     Equipment Recommendations  (transport chair for chemotherapy appointments)    Recommendations for Other Services       Precautions / Restrictions Precautions Precautions: Fall Restrictions Weight Bearing Restrictions: No    Mobility  Bed Mobility Overal bed mobility: Needs Assistance Bed Mobility: Supine to Sit;Sit to Supine     Supine to sit: Modified independent (Device/Increase time) Sit to supine: Modified independent (Device/Increase time)   General bed mobility comments: safe technique with upright posture once transitioned to EOB.   Transfers Overall transfer level: Needs assistance Equipment used: Rolling walker (2 wheeled) Transfers: Sit to/from Stand Sit to Stand: Min guard         General transfer comment: safe technique with cues for pushing from seated surface. Need slight min assist to transfer from lower surface commode.  Ambulation/Gait Ambulation/Gait assistance: Min guard Gait  Distance (Feet): 20 Feet Assistive device: Rolling walker (2 wheeled) Gait Pattern/deviations: Step-to pattern     General Gait Details: dizziness noted, slow step to gait pattern. No formal LOB, however generalized unsteadiness noted   Stairs             Wheelchair Mobility    Modified Rankin (Stroke Patients Only)       Balance Overall balance assessment: Needs assistance Sitting-balance support: Single extremity supported;Feet supported Sitting balance-Leahy Scale: Good     Standing balance support: Bilateral upper extremity supported Standing balance-Leahy Scale: Fair Standing balance comment: BUE on RW                            Cognition Arousal/Alertness: Awake/alert Behavior During Therapy: WFL for tasks assessed/performed Overall Cognitive Status: Within Functional Limits for tasks assessed                                        Exercises Other Exercises Other Exercises: OT facilitates education re: role of OT in acute setting and pt verbalized appropriate understanding. Other Exercises: OT facilitates education re: safe hand placement relative to RW for ADL transfers. Pt demos moderate understanding, reinforcement required for safety. Other Exercises: Seated ther-ex performed on B LE including alt. toe taps, LAQ, and alt. marching. All ther-ex performed x 10 reps with supervision. Fatigues quickly with exertion. Other Exercises: ambulated to bathroom with slow speed and fatigues. Able to transfer on/off toliet with set up and able to perform hygiene. Becomes dizzy once standing at sink for washing hands. All mobility  performed on RA with sats decreasing to 84%. Once back in bed, 2.5L of O2 donned with sats improving to 91%.    General Comments        Pertinent Vitals/Pain Pain Assessment: No/denies pain Faces Pain Scale: Hurts even more Pain Location: R hip, lower back Pain Descriptors / Indicators: Aching Pain  Intervention(s): Limited activity within patient's tolerance;Monitored during session;Repositioned(Pt left sitting up in recliner with chair alarm versus back to bed at end of session.)    Home Living Family/patient expects to be discharged to:: Private residence Living Arrangements: Parent;Other relatives(parents and brother) Available Help at Discharge: Family Type of Home: House Home Access: Level entry   Home Layout: One level Home Equipment: Tub bench;Walker - 4 wheels Additional Comments: Used a RW ~1.5 yrs ago d/t fall with hip fracture s/p ORIF (denies other falls)    Prior Function Level of Independence: Needs assistance  Gait / Transfers Assistance Needed: Indep, no AD ADL's / Homemaking Assistance Needed: Reports that her father typically does the driving and gets groceries. Reports her brother works during the day here at [facility] [employer]. Reports that her adult daughter helps some with bathing/dressing when pt is having tough day.     PT Goals (current goals can now be found in the care plan section) Acute Rehab PT Goals Patient Stated Goal: regain strength and balance PT Goal Formulation: With patient Time For Goal Achievement: 09/09/19 Potential to Achieve Goals: Good Progress towards PT goals: Progressing toward goals    Frequency    Min 2X/week      PT Plan Current plan remains appropriate    Co-evaluation              AM-PAC PT "6 Clicks" Mobility   Outcome Measure  Help needed turning from your back to your side while in a flat bed without using bedrails?: None Help needed moving from lying on your back to sitting on the side of a flat bed without using bedrails?: A Little Help needed moving to and from a bed to a chair (including a wheelchair)?: A Little Help needed standing up from a chair using your arms (e.g., wheelchair or bedside chair)?: A Little Help needed to walk in hospital room?: A Little Help needed climbing 3-5 steps with a railing? : A Lot 6  Click Score: 18    End of Session Equipment Utilized During Treatment: Oxygen Activity Tolerance: Patient tolerated treatment well Patient left: in bed;with bed alarm set Nurse Communication: Mobility status PT Visit Diagnosis: Unsteadiness on feet (R26.81);Other abnormalities of gait and mobility (R26.89);Difficulty in walking, not elsewhere classified (R26.2);Dizziness and giddiness (R42)     Time: 8381-8403 PT Time Calculation (min) (ACUTE ONLY): 24 min  Charges:  $Therapeutic Exercise: 8-22 mins $Therapeutic Activity: 8-22 mins                     Greggory Stallion, PT, DPT [phone]    Charlene [name] 08/29/2019, 4:19 PM

## 2019-08-29 NOTE — Progress Notes (Signed)
[facility]  ROUNDING NOTE   Subjective:   Moved from ICU to [facility] .  Na 130.   Objective:  Vital signs in last 24 hours:  Temp:  [97.6 F (36.4 C)-98.3 F (36.8 C)] 97.6 F (36.4 C) (03/15 0521) Pulse Rate:  [87-103] 87 (03/15 0521) Resp:  [18] 18 (03/15 0521) BP: (96-113)/(65-70) 96/65 (03/15 0521) SpO2:  [97 %-100 %] 100 % (03/15 0521)  Weight change:  Filed Weights   08/24/19 1042 08/24/19 1806 08/27/19 0500  Weight: 93 kg 93.9 kg 93.9 kg    Intake/Output: I/O last 3 completed shifts: In: 272.6 [I.V.:272.6] Out: 1025 [Urine:1025]   Intake/Output this shift:  No intake/output data recorded.  Physical Exam: General: No acute distress, laying in bed  Head: Normocephalic, atraumatic. Moist oral mucosal membranes  Eyes: Anicteric  Neck: Supple, trachea midline  Lungs:  clear  Heart: regular  Abdomen:  Soft, nontender, bowel sounds present  Extremities: + peripheral edema.  Neurologic: Awake, alert, conversant  Skin: No lesions       Basic Metabolic Panel: Recent Labs  Lab 08/24/19 1044 08/24/19 1044 08/24/19 1445 08/24/19 1700 08/25/19 0511 08/25/19 1837 08/26/19 0243 08/26/19 0841 08/28/19 0459 08/28/19 0940 08/28/19 1322 08/28/19 1734 08/28/19 2115 08/29/19 0109 08/29/19 0659  NA 112*   < > 112*   < > 120*   < > 128*   < > 130*   < > 127* 127* 129* 128* 130*  K 3.1*  --   --   --  3.0*  --  4.3  --  3.9  --   --   --   --   --  4.2  CL 75*  --   --   --  86*  --  97*  --  95*  --   --   --   --   --  91*  CO2 25  --   --   --  27  --  25  --  29  --   --   --   --   --  31  GLUCOSE 125*  --   --   --  104*  --  102*  --  102*  --   --   --   --   --  92  BUN 7  --   --   --  5*  --  9  --  7  --   --   --   --   --  9  CREATININE <0.30*  --   --   --  0.34*  --  0.51  --  0.45  --   --   --   --   --  0.53  CALCIUM 8.4*   < >  --   --  7.7*  --  8.0*  --  8.3*  --   --   --   --   --  8.5*  MG  --   --  2.0  --   --   --  1.9  --   2.0  --   --   --   --   --  2.1   < > = values in this interval not displayed.    Liver Function Tests: No results for input(s): AST, ALT, ALKPHOS, BILITOT, PROT, ALBUMIN in the last 168 hours. No results for input(s): LIPASE, AMYLASE in the last 168 hours. No results for input(s): AMMONIA in the  last 168 hours.  CBC: Recent Labs  Lab 08/24/19 0936 08/24/19 1044 08/26/19 0243 08/26/19 0841 08/27/19 0501 08/28/19 0459 08/29/19 0659  WBC 14.8*   < > 1.8* 1.9* 1.0* 1.5* 3.5*  NEUTROABS 13.5*  --   --  1.0*  --   --  1.6*  HGB 11.2*   < > 9.5* 9.6* 8.9* 8.7* 8.6*  HCT 31.7*   < > 28.7* 29.4* 27.3* 26.1* 27.1*  MCV 85.2   < > 89.1 90.7 90.7 90.9 93.8  PLT 225   < > 148* 160 129* 104* 83*   < > = values in this interval not displayed.    Cardiac Enzymes: No results for input(s): CKTOTAL, CKMB, CKMBINDEX, TROPONINI in the last 168 hours.  BNP: Invalid input(s): POCBNP  CBG: Recent Labs  Lab 08/24/19 1807  GLUCAP 151*    Microbiology: Results for orders placed or performed during the hospital encounter of 08/24/19  SARS CORONAVIRUS 2 (TAT 6-24 HRS) Nasopharyngeal Nasopharyngeal Swab     Status: None   Collection Time: 08/24/19  1:37 PM   Specimen: Nasopharyngeal Swab  Result Value Ref Range Status   SARS Coronavirus 2 NEGATIVE NEGATIVE Final    Comment: (NOTE) SARS-CoV-2 target nucleic acids are NOT DETECTED. The SARS-CoV-2 RNA is generally detectable in upper and lower respiratory specimens during the acute phase of infection. Negative results do not preclude SARS-CoV-2 infection, do not rule out co-infections with other pathogens, and should not be used as the sole basis for treatment or other patient management decisions. Negative results must be combined with clinical observations, patient history, and epidemiological information. The expected result is Negative. Fact Sheet for Patients: SugarRoll.be Fact Sheet for Healthcare  Providers: https://www.woods-mathews.com/ This test is not yet approved or cleared by the [place] FDA and  has been authorized for detection and/or diagnosis of SARS-CoV-2 by FDA under an Emergency Use Authorization (EUA). This EUA will remain  in effect (meaning this test can be used) for the duration of the COVID-19 declaration under Section 56 4(b)(1) of the Act, 21 U.S.C. section 360bbb-3(b)(1), unless the authorization is terminated or revoked sooner. Performed at [facility], [place] [address], [place], [place] [address]   Urine Culture     Status: Abnormal   Collection Time: 08/24/19  1:37 PM   Specimen: Urine, Random  Result Value Ref Range Status   Specimen Description   Final    URINE, RANDOM Performed at [facility], [address] [place], [place] [address]    Special Requests   Final    NONE Performed at [facility], [place], [place] [address]    Culture >=100,000 COLONIES/mL PROTEUS MIRABILIS (A)  Final   Report Status 08/26/2019 FINAL  Final   Organism ID, Bacteria PROTEUS MIRABILIS (A)  Final      Susceptibility   Proteus mirabilis - MIC*    AMPICILLIN <=2 SENSITIVE Sensitive     CEFAZOLIN <=4 SENSITIVE Sensitive     CEFTRIAXONE <=0.25 SENSITIVE Sensitive     CIPROFLOXACIN <=0.25 SENSITIVE Sensitive     GENTAMICIN <=1 SENSITIVE Sensitive     IMIPENEM 2 SENSITIVE Sensitive     NITROFURANTOIN 128 RESISTANT Resistant     TRIMETH/SULFA <=20 SENSITIVE Sensitive     AMPICILLIN/SULBACTAM <=2 SENSITIVE Sensitive     PIP/TAZO <=4 SENSITIVE Sensitive     * >=100,000 COLONIES/mL PROTEUS MIRABILIS  Respiratory Panel by RT PCR (Flu A&B, Covid) - Nasopharyngeal Swab  Status: None   Collection Time: 08/24/19  4:13 PM   Specimen: Nasopharyngeal Swab  Result Value Ref Range Status   SARS Coronavirus 2 by RT PCR NEGATIVE NEGATIVE Final    Comment: (NOTE) SARS-CoV-2 target nucleic acids are NOT DETECTED. The  SARS-CoV-2 RNA is generally detectable in upper respiratoy specimens during the acute phase of infection. The lowest concentration of SARS-CoV-2 viral copies this assay can detect is 131 copies/mL. A negative result does not preclude SARS-Cov-2 infection and should not be used as the sole basis for treatment or other patient management decisions. A negative result may occur with  improper specimen collection/handling, submission of specimen other than nasopharyngeal swab, presence of viral mutation(s) within the areas targeted by this assay, and inadequate number of viral copies (<131 copies/mL). A negative result must be combined with clinical observations, patient history, and epidemiological information. The expected result is Negative. Fact Sheet for Patients:  PinkCheek.be Fact Sheet for Healthcare Providers:  GravelBags.it This test is not yet ap proved or cleared by the [place] FDA and  has been authorized for detection and/or diagnosis of SARS-CoV-2 by FDA under an Emergency Use Authorization (EUA). This EUA will remain  in effect (meaning this test can be used) for the duration of the COVID-19 declaration under Section 564(b)(1) of the Act, 21 U.S.C. section 360bbb-3(b)(1), unless the authorization is terminated or revoked sooner.    Influenza A by PCR NEGATIVE NEGATIVE Final   Influenza B by PCR NEGATIVE NEGATIVE Final    Comment: (NOTE) The Xpert Xpress SARS-CoV-2/FLU/RSV assay is intended as an aid in  the diagnosis of influenza from Nasopharyngeal swab specimens and  should not be used as a sole basis for treatment. Nasal washings and  aspirates are unacceptable for Xpert Xpress SARS-CoV-2/FLU/RSV  testing. Fact Sheet for Patients: PinkCheek.be Fact Sheet for Healthcare Providers: GravelBags.it This test is not yet approved or cleared by the [place] FDA and  has been authorized for detection and/or diagnosis of SARS-CoV-2 by  FDA under an Emergency Use Authorization (EUA). This EUA will remain  in effect (meaning this test can be used) for the duration of the  Covid-19 declaration under Section 564(b)(1) of the Act, 21  U.S.C. section 360bbb-3(b)(1), unless the authorization is  terminated or revoked. Performed at [facility], [address] [place], [place] [address]   MRSA PCR Screening     Status: Abnormal   Collection Time: 08/24/19  6:10 PM   Specimen: Nasopharyngeal  Result Value Ref Range Status   MRSA by PCR POSITIVE (A) NEGATIVE Final    Comment:        The GeneXpert MRSA Assay (FDA approved for NASAL specimens only), is one component of a comprehensive MRSA colonization surveillance program. It is not intended to diagnose MRSA infection nor to guide or monitor treatment for MRSA infections. RESULT CALLED TO, READ BACK BY AND VERIFIED WITH: KRISTY ADELOWO AT 1950 08/24/2019  TFK Performed at [facility], [address] [place], [place] [address]   CULTURE, BLOOD (ROUTINE X 2) w Reflex to ID Panel     Status: None   Collection Time: 08/24/19  6:16 PM   Specimen: BLOOD  Result Value Ref Range Status   Specimen Description BLOOD LEFT ANTECUBITAL  Final   Special Requests   Final    BOTTLES DRAWN AEROBIC AND ANAEROBIC Blood Culture adequate volume   Culture   Final    NO GROWTH 5 DAYS Performed at [facility],  [place], [place] [address]    Report Status 08/29/2019 FINAL  Final  CULTURE, BLOOD (ROUTINE X 2) w Reflex to ID Panel     Status: None   Collection Time: 08/24/19  6:23 PM   Specimen: BLOOD  Result Value Ref Range Status   Specimen Description BLOOD BLOOD RIGHT HAND  Final   Special Requests   Final    BOTTLES DRAWN AEROBIC AND ANAEROBIC Blood Culture adequate volume   Culture   Final    NO GROWTH 5 DAYS Performed at [facility],  [address] [place],  [address]    Report Status 08/29/2019 FINAL  Final    Coagulation Studies: No results for input(s): LABPROT, INR in the last 72 hours.  Urinalysis: No results for input(s): COLORURINE, LABSPEC, PHURINE, GLUCOSEU, HGBUR, BILIRUBINUR, KETONESUR, PROTEINUR, UROBILINOGEN, NITRITE, LEUKOCYTESUR in the last 72 hours.  Invalid input(s): APPERANCEUR    Imaging: No results found.   Medications:    . benzonatate  100 mg Oral TID  . cefdinir  300 mg Oral Q12H  . Chlorhexidine Gluconate Cloth  6 each Topical Q0600  . chlorpheniramine-HYDROcodone  5 mL Oral Q12H  . cyclobenzaprine  10 mg Oral TID  . diltiazem  120 mg Oral Daily  . divalproex  500 mg Oral Q12H  . feeding supplement (ENSURE ENLIVE)  237 mL Oral TID BM  . fluconazole  100 mg Oral Daily  . fluticasone  2 spray Each Nare Daily  . guaiFENesin  1,200 mg Oral BID  . influenza vac split quadrivalent PF  0.5 mL Intramuscular Once  . ipratropium  0.5 mg Nebulization TID  . levalbuterol  0.63 mg Nebulization TID  . montelukast  10 mg Oral QHS  . multivitamin with minerals  1 tablet Oral Daily  . mupirocin ointment   Nasal BID  . pantoprazole  40 mg Oral Daily  . polyethylene glycol  17 g Oral Daily  . risperiDONE  1 mg Oral BID  . senna-docusate  1 tablet Oral BID  . sodium chloride  2 g Oral TID WC   acetaminophen **OR** acetaminophen, ALPRAZolam, hydrALAZINE, levalbuterol, loratadine, meclizine, menthol-cetylpyridinium, ondansetron **OR** ondansetron (ZOFRAN) IV, oxyCODONE-acetaminophen  Assessment/ Plan:  Ms. Charlene [name] is a 59 y.o. 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   1.  Hyponatremia likely secondary to SIADH. 2.  Small cell carcinoma of the right lower lobe of the lung with  recurrence. 3.  Pancytopenia with Anemia, leukopenia and thrombocytopenia.   Plan: - Continue fluid restriction - PO sodium chloride    LOS: 5 Eda Magnussen 3/15/202112:26 PM

## 2019-08-29 NOTE — Progress Notes (Addendum)
[facility] Progress Note  Patient: Charlene [name]    GDJ:[id number]  DOA: 08/24/2019     Date of Service: the patient was seen and examined on 08/29/2019  Chief Complaint  Patient presents with  . Weakness  . Abnormal Lab   Brief hospital course: Charlene [name] is a 59 y.o. 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  Pt was in cancer center for labs drawn before plan to have chemotherapy today. She was found to have hyponatremia with sodium of 111.  Patient states that she has generalized weakness and dizziness.  No confusion or seizure. 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  Hyponatremia secondary to SIADH Continue stepdown unit. Initially 3% saline per nephrology. Now on salt tablets. Continue fluid restriction. Avoid overcorrection.  2.  Right lower lobe small cell lung cancer stage IV. History of COPD. Chronic cough On chemotherapy outpatient. Last dose was 08/19/2019. Follow-up with oncology. Currently on fluconazole which continue. Continues to have shortness of breath seen. Change to bronchodilators and cough medicines.  3.  Hypokalemia Repleted.  Monitor.  4.  Proteus UTI Currently on IV Rocephin.  Urine culture growing Proteus.  Will transitioning to oral Omnicef..  5.  Essential hypertension Blood pressure controlled. Was hypotensive this morning.  Had to hold Cardizem. Given her normal blood pressure after bolus continue Cardizem.  Monitor.  6.  GERD Continue PPI.  7.  Tachycardia. Patient is on Cardizem at home. Due to hypotension Cardizem was on hold. We will resume Cardizem  8.  Cancer-related pain. Patient is on Norco. Changing to Percocet given her uncontrolled pain.  9.  Anxiety. Added Xanax.  10 constipation. Bowel regimen  adjusted as well. X-ray shows no evidence of obstruction. Shows improvement with bowel regimen movement.  11.  Thrombocytopenia. Acute in nature. Could be dilutional. We will monitor for now protection no active bleeding.  12.  Dizziness. Orthostatic negative. Likely from medications. Check Depakote level which is normal.  Mild elevation of ammonia which should be better with lactulose.  Body mass index is 31.48 kg/m.   Diet: Regular diet DVT Prophylaxis: SCD  Advance goals of care discussion: Full code  Family Communication: no family was present at bedside, at the time of interview.   Disposition:  Pt is from home, admitted with abnormal lab with hyponatremia, still has severe hyponatremia, which precludes a safe discharge. Discharge to home, when medically stable.  Subjective: Cough still present.  And actually worsening.  No nausea no vomiting.  No fever no chills.  No chest pain right now.  Physical Exam: General:  alert oriented to time, place, and person.  Appear in mild distress, affect appropriate Eyes: PERRL ENT: Oral Mucosa Clear, moist  Neck: no JVD,  Cardiovascular: S1 and S2 Present, no Murmur,  Respiratory: good respiratory effort, Bilateral Air entry equal and Decreased, no Crackles, bilateral  wheezes Abdomen: Bowel Sound present, Soft and no tenderness,  Skin: no rash Extremities: Trace pedal edema, no calf tenderness Neurologic: without any new focal findings Gait not checked due to patient safety concerns  Vitals:   08/29/19 1526 08/29/19 1724 08/29/19 1725 08/29/19 1726  BP: 95/63     Pulse: 99     Resp: 18     Temp:      TempSrc: Oral     SpO2:  100% 98% (!) 77% 94%  Weight:      Height:        Intake/Output Summary (Last 24 hours) at 08/29/2019 1933 Last data filed at 08/29/2019 0012 Gross per 24 hour  Intake --  Output 700 ml  Net -700 ml   Filed Weights   08/24/19 1042 08/24/19 1806 08/27/19 0500  Weight: 93 kg 93.9 kg 93.9 kg     Data Reviewed: I have personally reviewed and interpreted daily labs, tele strips, imagings as discussed above. I reviewed all nursing notes, pharmacy notes, vitals, pertinent old records I have discussed plan of care as described above with RN and patient/family.  CBC: Recent Labs  Lab 08/24/19 0936 08/24/19 1044 08/26/19 0243 08/26/19 0841 08/27/19 0501 08/28/19 0459 08/29/19 0659  WBC 14.8*   < > 1.8* 1.9* 1.0* 1.5* 3.5*  NEUTROABS 13.5*  --   --  1.0*  --   --  1.6*  HGB 11.2*   < > 9.5* 9.6* 8.9* 8.7* 8.6*  HCT 31.7*   < > 28.7* 29.4* 27.3* 26.1* 27.1*  MCV 85.2   < > 89.1 90.7 90.7 90.9 93.8  PLT 225   < > 148* 160 129* 104* 83*   < > = values in this interval not displayed.   Basic Metabolic Panel: Recent Labs  Lab 08/24/19 1044 08/24/19 1044 08/24/19 1445 08/24/19 1700 08/25/19 0511 08/25/19 1837 08/26/19 0243 08/26/19 0841 08/28/19 0459 08/28/19 0940 08/28/19 1322 08/28/19 1734 08/28/19 2115 08/29/19 0109 08/29/19 0659  NA 112*   < > 112*   < > 120*   < > 128*   < > 130*   < > 127* 127* 129* 128* 130*  K 3.1*  --   --   --  3.0*  --  4.3  --  3.9  --   --   --   --   --  4.2  CL 75*  --   --   --  86*  --  97*  --  95*  --   --   --   --   --  91*  CO2 25  --   --   --  27  --  25  --  29  --   --   --   --   --  31  GLUCOSE 125*  --   --   --  104*  --  102*  --  102*  --   --   --   --   --  92  BUN 7  --   --   --  5*  --  9  --  7  --   --   --   --   --  9  CREATININE <0.30*  --   --   --  0.34*  --  0.51  --  0.45  --   --   --   --   --  0.53  CALCIUM 8.4*  --   --   --  7.7*  --  8.0*  --  8.3*  --   --   --   --   --  8.5*  MG  --   --  2.0  --   --   --  1.9  --  2.0  --   --   --   --   --  2.1   < > = values in this interval not  displayed.    Studies: No results found.  Scheduled Meds: . benzonatate  100 mg Oral TID  . cefdinir  300 mg Oral Q12H  . Chlorhexidine Gluconate Cloth  6 each Topical Q0600  . chlorpheniramine-HYDROcodone   5 mL Oral Q12H  . cyclobenzaprine  10 mg Oral TID  . diltiazem  120 mg Oral Daily  . divalproex  500 mg Oral Q12H  . feeding supplement (ENSURE ENLIVE)  237 mL Oral TID BM  . fluconazole  100 mg Oral Daily  . fluticasone  2 spray Each Nare Daily  . guaiFENesin  1,200 mg Oral BID  . influenza vac split quadrivalent PF  0.5 mL Intramuscular Once  . ipratropium  0.5 mg Nebulization TID  . levalbuterol  0.63 mg Nebulization TID  . montelukast  10 mg Oral QHS  . multivitamin with minerals  1 tablet Oral Daily  . mupirocin ointment   Nasal BID  . pantoprazole  40 mg Oral Daily  . polyethylene glycol  17 g Oral Daily  . risperiDONE  1 mg Oral BID  . senna-docusate  1 tablet Oral BID  . sodium chloride  2 g Oral TID WC   Continuous Infusions:  PRN Meds: acetaminophen **OR** acetaminophen, ALPRAZolam, hydrALAZINE, levalbuterol, loratadine, meclizine, menthol-cetylpyridinium, ondansetron **OR** ondansetron (ZOFRAN) IV, oxyCODONE-acetaminophen  Time spent: 35 minutes  Author: Berle Mull, MD [facility] 08/29/2019 7:33 PM  To reach On-call, see care teams to locate the attending and reach out to them via [email or web address]. If 7PM-7AM, please contact night-coverage If you still have difficulty reaching the attending provider, please page the [facility] (Director on Call) for [facility] on amion for assistance.

## 2019-08-29 NOTE — Care Management Important Message (Signed)
Important Message  Patient Details  Name: Charlene [name] MRN: [id number] Date of Birth: 1961-04-03   Medicare Important Message Given:  Yes     [name] 08/29/2019, 12:27 PM

## 2019-08-30 ENCOUNTER — Encounter: Payer: Self-pay | Admitting: Internal Medicine

## 2019-08-30 LAB — CBC WITH DIFFERENTIAL/PLATELET
Abs Immature Granulocytes: 0.75 10*3/uL — ABNORMAL HIGH (ref 0.00–0.07)
Basophils Absolute: 0 10*3/uL (ref 0.0–0.1)
Basophils Relative: 0 %
Eosinophils Absolute: 0.2 10*3/uL (ref 0.0–0.5)
Eosinophils Relative: 2 %
HCT: 26.8 % — ABNORMAL LOW (ref 36.0–46.0)
Hemoglobin: 8.8 g/dL — ABNORMAL LOW (ref 12.0–15.0)
Immature Granulocytes: 9 %
Lymphocytes Relative: 14 %
Lymphs Abs: 1.2 10*3/uL (ref 0.7–4.0)
MCH: 30.8 pg (ref 26.0–34.0)
MCHC: 32.8 g/dL (ref 30.0–36.0)
MCV: 93.7 fL (ref 80.0–100.0)
Monocytes Absolute: 1.3 10*3/uL — ABNORMAL HIGH (ref 0.1–1.0)
Monocytes Relative: 15 %
Neutro Abs: 5.2 10*3/uL (ref 1.7–7.7)
Neutrophils Relative %: 60 %
Platelets: 80 10*3/uL — ABNORMAL LOW (ref 150–400)
RBC: 2.86 MIL/uL — ABNORMAL LOW (ref 3.87–5.11)
RDW: 16.5 % — ABNORMAL HIGH (ref 11.5–15.5)
Smear Review: NORMAL
WBC: 8.6 10*3/uL (ref 4.0–10.5)
nRBC: 0 % (ref 0.0–0.2)

## 2019-08-30 LAB — BASIC METABOLIC PANEL
Anion gap: 8 (ref 5–15)
BUN: 10 mg/dL (ref 6–20)
CO2: 31 mmol/L (ref 22–32)
Calcium: 8.7 mg/dL — ABNORMAL LOW (ref 8.9–10.3)
Chloride: 93 mmol/L — ABNORMAL LOW (ref 98–111)
Creatinine, Ser: 0.35 mg/dL — ABNORMAL LOW (ref 0.44–1.00)
GFR calc Af Amer: >60 mL/min (ref >60)
GFR calc non Af Amer: >60 mL/min (ref >60)
Glucose, Bld: 98 mg/dL (ref 70–99)
Potassium: 4.2 mmol/L (ref 3.5–5.1)
Sodium: 132 mmol/L — ABNORMAL LOW (ref 135–145)

## 2019-08-30 LAB — MAGNESIUM: Magnesium: 2 mg/dL (ref 1.7–2.4)

## 2019-08-30 MED ORDER — GUAIFENESIN ER 600 MG PO TB12: 1200.0000 mg | ORAL_TABLET | Freq: Two times a day (BID) | ORAL | 0 refills | Status: AC

## 2019-08-30 MED ORDER — HEPARIN SOD (PORK) LOCK FLUSH 100 UNIT/ML IV SOLN
500.0000 [IU] | Freq: Once | INTRAVENOUS | Status: AC
  Administered 2019-08-30: 500 [IU] via INTRAVENOUS
  Filled 2019-08-30: qty 5

## 2019-08-30 MED ORDER — SODIUM CHLORIDE 0.9% FLUSH
10.0000 mL | INTRAVENOUS | Status: AC | PRN
  Administered 2019-08-30: 10 mL

## 2019-08-30 MED ORDER — HEPARIN SOD (PORK) LOCK FLUSH 100 UNIT/ML IV SOLN
250.0000 [IU] | INTRAVENOUS | Status: DC | PRN
  Filled 2019-08-30: qty 5

## 2019-08-30 MED ORDER — ALPRAZOLAM 0.5 MG PO TABS: 0.5000 mg | ORAL_TABLET | Freq: Two times a day (BID) | ORAL | 0 refills | Status: AC | PRN

## 2019-08-30 MED ORDER — ENSURE ENLIVE PO LIQD: 237.0000 mL | Freq: Three times a day (TID) | ORAL | 0 refills | Status: AC

## 2019-08-30 MED ORDER — SODIUM CHLORIDE 1 G PO TABS: 1.0000 g | ORAL_TABLET | Freq: Three times a day (TID) | ORAL | 0 refills | Status: AC

## 2019-08-30 MED ORDER — BENZONATATE 100 MG PO CAPS: 100.0000 mg | ORAL_CAPSULE | Freq: Three times a day (TID) | ORAL | 0 refills | Status: AC

## 2019-08-30 MED ORDER — HYDROCOD POLST-CPM POLST ER 10-8 MG/5ML PO SUER: 5.0000 mL | Freq: Two times a day (BID) | ORAL | 0 refills | Status: AC | PRN

## 2019-08-30 MED ORDER — POLYETHYLENE GLYCOL 3350 17 G PO PACK: 17.0000 g | PACK | Freq: Every day | ORAL | 0 refills | Status: AC

## 2019-08-30 MED ORDER — LEVALBUTEROL HCL 0.63 MG/3ML IN NEBU: 0.6300 mg | INHALATION_SOLUTION | Freq: Three times a day (TID) | RESPIRATORY_TRACT | 0 refills | Status: DC | PRN

## 2019-08-30 MED ORDER — OXYCODONE-ACETAMINOPHEN 5-325 MG PO TABS: 1.0000 | ORAL_TABLET | ORAL | 0 refills | Status: AC | PRN

## 2019-08-30 MED ORDER — MECLIZINE HCL 12.5 MG PO TABS: 12.5000 mg | ORAL_TABLET | Freq: Three times a day (TID) | ORAL | 0 refills | Status: AC | PRN

## 2019-08-30 MED ORDER — IPRATROPIUM BROMIDE 0.02 % IN SOLN: 0.5000 mg | RESPIRATORY_TRACT | 0 refills | Status: DC | PRN

## 2019-08-30 MED ORDER — DIVALPROEX SODIUM 500 MG PO DR TAB: 500.0000 mg | DELAYED_RELEASE_TABLET | Freq: Two times a day (BID) | ORAL | 0 refills | Status: AC

## 2019-08-30 NOTE — TOC Progression Note (Addendum)
Transition of Care [facility]) - Progression Note    Patient Details  Name: Charlene [name] MRN: [id number] Date of Birth: 03/09/1961  Transition of Care [facility]) CM/SW Contact  Beverly Sessions, RN Phone Number: 08/30/2019, 10:02 AM  Clinical Narrative:     PT has assessed patient and recommends home health PT  Patient will need home O2 at dischage.  Bedside RN to get qualifying sats.   Patient agreeable to home health services RN and PT.MD notified.  Patient states she does not have a preference of home health agency.  Referral made to [facility] with [place]  Heads up referral made to [facility] with [employer] for home O2  UPDATE:  Father requesting additional services in the home.  Outpatient palliative referral made to Santiago Glad with [employer]  Encouraged father to reach out to DSS to discuss Medicaid PCS services  Discuss option of long term SNF placement.  Father states "that's not what we want to do"   Expected Discharge Plan: [place] Barriers to Discharge: Continued Medical Work up  Expected Discharge Plan and Services Expected Discharge Plan: [place] arrangements for the past 2 months: Single Family Home                 DME Arranged: Oxygen DME Agency: [employer] Date DME Agency Contacted: 08/30/19 Time DME Agency Contacted: 1001 Representative spoke with at DME Agency: [place]: RN, PT [place] Agency: [place] ([place]) Date [place]: 08/30/19 Time [place]: 1001 Representative spoke with at [place]: [place] ([place]) Interventions    Readmission Risk Interventions Readmission Risk Prevention Plan 08/25/2019  Transportation Screening Complete  PCP or Specialist Appt within 3-5 Days Complete  HRI or Home Care Consult Complete  Medication Review Press photographer) Complete  Some recent data might be hidden

## 2019-08-30 NOTE — Progress Notes (Signed)
SATURATION QUALIFICATIONS: (This note is used to comply with regulatory documentation for home oxygen)  Patient Saturations on Room Air at Rest =89 %  Patient Saturations on Room Air while Ambulating = 79 %  Patient Saturations on 4Liters of oxygen while Ambulating = 93%  Please briefly explain why patient needs home oxygen:

## 2019-08-30 NOTE — Plan of Care (Signed)
Discharge order received. Patient mental status is at baseline. Vital signs stable . No signs of acute distress. Discharge instructions given. Patient verbalized understanding. No other issues noted at this time.   

## 2019-08-30 NOTE — Progress Notes (Signed)
Occupational Therapy Treatment Patient Details Name: Charlene [name] MRN: [id number] DOB: [birth year] Today's Date: 08/30/2019    History of present illness Charlene [name] is a 59 y/o F who comes to [facility] 3/10 lightheadedness, dizziness, and hyponatremia (Na+: 111).  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 At time of evaluation, Na+: corrected.   OT comments  Pt seen for OT tx this date to d/u re: safety and independence with self care ADLs/ADL mobility. OT facilitates pt participation in sup to sit transition with MOD I, sit to stand with CGA, and fxl mobility with pt's rollator from bed to sink-side to complete morning grooming tasks including washing face and brushing teeth. OT facilitates some aspects of setup and MIN verbal cues to assist pt in modified technique to open toothbrush wrapper. OT monitors sats throughout (see precaution section). Anticipate pt will need to use rollator for safety with fxl mobility at home as well as nasal cannula/supplemental oxygen for activity at this time. Anticipate pt would benefit from [facility] and recommend home health OT upon discharge from acute hospitalization.     Follow Up Recommendations  Home health OT;Supervision - Intermittent(supv with all OOB/OOC activity/mobility)    Equipment Recommendations  3 in 1 bedside commode    Recommendations for Other Services      Precautions / Restrictions Precautions Precautions: Fall Restrictions Weight Bearing Restrictions: No Other Position/Activity Restrictions: monitor O2 sats, 98% on 2Lnc at rest, maintains 92-93% on RA at rest, in standing-desats to 82% on RA and nasal cannula re-applied, seated RB and PLB initiated and pt requires ~2 mins to recover to >90%       Mobility Bed Mobility Overal bed mobility: Needs Assistance Bed Mobility: Supine to Sit;Sit to Supine     Supine to sit: Modified independent (Device/Increase  time);HOB elevated Sit to supine: Modified independent (Device/Increase time)   General bed mobility comments: use of bed rail, extended time  Transfers Overall transfer level: Needs assistance Equipment used: Rolling walker (2 wheeled) Transfers: Sit to/from Stand Sit to Stand: Min guard         General transfer comment: cues for safe hand placement with use of RW. Pt wants to use rollator from home that she has in room as this is what she is used to and OT obliges, requires MIN cues for safe use of brakes.    Balance Overall balance assessment: Needs assistance Sitting-balance support: Single extremity supported;Feet supported Sitting balance-Leahy Scale: Good     Standing balance support: Bilateral upper extremity supported Standing balance-Leahy Scale: Fair Standing balance comment: BUE on RW                           ADL either performed or assessed with clinical judgement   ADL Overall ADL's : Needs assistance/impaired     Grooming: Wash/dry face;Oral care;Min guard;Standing Grooming Details (indicate cue type and reason): with RW sink-side with cues for safe use of RW.                             Functional mobility during ADLs: Min guard;Minimal assistance;Rolling walker(bed to/from sink)       Vision Patient Visual Report: No change from baseline     Perception     Praxis      Cognition Arousal/Alertness: Awake/alert Behavior During Therapy: WFL for tasks assessed/performed Overall Cognitive Status: Within  Functional Limits for tasks assessed                                          Exercises Other Exercises Other Exercises: OT facilitates education re: PLB techniqe to improve oxygenation. Pt demos moderate understanding.   Shoulder Instructions       General Comments      Pertinent Vitals/ Pain       Faces Pain Scale: Hurts little more Pain Location: lower back Pain Descriptors / Indicators:  Aching Pain Intervention(s): Monitored during session  Home Living                                          Prior Functioning/Environment              Frequency  Min 2X/week        Progress Toward Goals  OT Goals(current goals can now be found in the care plan section)  Progress towards OT goals: Progressing toward goals  Acute Rehab OT Goals Patient Stated Goal: regain strength and balance OT Goal Formulation: With patient Time For Goal Achievement: 09/12/19 Potential to Achieve Goals: Good  Plan Discharge plan remains appropriate    Co-evaluation                 AM-PAC OT "6 Clicks" Daily Activity     Outcome Measure   Help from another person eating meals?: None Help from another person taking care of personal grooming?: A Little Help from another person toileting, which includes using toliet, bedpan, or urinal?: A Lot Help from another person bathing (including washing, rinsing, drying)?: A Lot Help from another person to put on and taking off regular upper body clothing?: A Little Help from another person to put on and taking off regular lower body clothing?: A Lot 6 Click Score: 16    End of Session Equipment Utilized During Treatment: Gait belt;Rolling walker  OT Visit Diagnosis: Unsteadiness on feet (R26.81);Muscle weakness (generalized) (M62.81)   Activity Tolerance Patient tolerated treatment well   Patient Left in bed;with bed alarm set;with call bell/phone within reach   Nurse Communication Mobility status        Time: 2500-3704 OT Time Calculation (min): 31 min  Charges: OT General Charges $OT Visit: 1 Visit OT Treatments $Self Care/Home Management : 8-22 mins $Therapeutic Activity: 8-22 mins  Gerrianne Scale, MS, OTR/L ascom [phone] 08/30/19, 1:42 PM

## 2019-08-30 NOTE — Progress Notes (Addendum)
New referral for [facility]iative program received from [facility]. Patient is already set up with our Palliative services and  Has an appointment for 3/18.TOC Stephanie Bowen updated.  Plan is for discharge [facility] with [facility]. Referral updated. Thank you Flo Shanks BSN, RN, [place] [phone]

## 2019-08-31 ENCOUNTER — Telehealth: Payer: Self-pay | Admitting: *Deleted

## 2019-08-31 NOTE — Discharge Summary (Signed)
[facility] Discharge Summary   Patient: Charlene [name] WFU:[id number]  PCP: System, Pcp Not In  Date of admission: 08/24/2019   Date of discharge: 08/30/2019      Discharge Diagnoses:   Principal Problem:   Hyponatremia Active Problems:   Chronic obstructive pulmonary disease ([place])   Essential (primary) hypertension   Primary cancer of right lower lobe of lung (HCC)   Anemia   GERD (gastroesophageal reflux disease)   Hypokalemia   Leukocytosis   UTI (urinary tract infection)   Admitted From: home Disposition:  Home home health with palliative care support  Recommendations for Outpatient Follow-up:  1. PCP: please follow up in 1 week  2. Please initiate Palliative care consultation outpatient 3. Follow up LABS/TEST:  none  Follow-up Information    Schedule an appointment as soon as possible for a visit with Dr. Pincus Large.   Why: Please schedule PCP appointment within 5-7 days of discharge. Contact information: [address] [place],  [address]. Butler, NP. Schedule an appointment as soon as possible for a visit on 09/07/2019.   Specialty: Hospice and Palliative Medicine Why: 9:30am Contact information: [place] [address] [phone]          Diet recommendation: Cardiac diet  Activity: The patient is advised to gradually reintroduce usual activities, as tolerated  Discharge Condition: stable  Code Status: DNR   History of present illness: As per the H and P dictated on admission, "Charlene [name] is a 59 y.o. 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  Pt was in cancer center for labs drawn before plan to have chemotherapy today. She was found to have hyponatremia with sodium of 111.  Patient states that she has generalized weakness and dizziness.  No confusion or  seizure.  She has chronic cough and mild shortness breath, no chest pain, fever or chills.  She had diarrhea in the past few days, which has resolved.  Currently no nausea, vomiting, diarrhea or abdominal pain.  Patient has mild dysuria in the past several days. "  Hospital Course:  Summary of her active problems in the hospital is as following.   1.  Hyponatremia secondary to SIADH Continue stepdown unit. Initially 3% saline per nephrology. Now on salt tablets.  2.  Right lower lobe small cell lung cancer stage IV. History of COPD. Chronic cough On chemotherapy outpatient. Last dose was 08/19/2019. Follow-up with oncology. Currently on fluconazole which continue. Continues to have shortness of breath seen. Change to bronchodilators and cough medicines.  3.  Hypokalemia Repleted.  Monitor.  4.  Proteus UTI Currently on IV Rocephin.  Urine culture growing Proteus.  Will transitioning to oral Omnicef..  5.  Essential hypertension Blood pressure controlled.  6.  GERD Continue PPI.  7.  Tachycardia. Patient is on Cardizem at home. Due to hypotension Cardizem was on hold. We will resume Cardizem  8.  Cancer-related pain. Patient is on Norco. Changing to Percocet given her uncontrolled pain.  9.  Anxiety. Added Xanax.  10 constipation. Bowel regimen adjusted as well. X-ray shows no evidence of obstruction. Shows improvement with bowel regimen movement.  11.  Thrombocytopenia. Acute in nature. Could be dilutional. no active bleeding.  12.  Dizziness. Orthostatic negative. Likely from medications. Depakote level which is normal.  Patient was seen by  physical therapy, who recommended Home health, which was arranged. On the day of the discharge the patient's vitals were stable, and no other acute medical condition were reported by patient. the patient was felt safe to be discharge at Home with Home health.  Consultants: Nephrology Procedures:  none  Discharge Exam: General: Appear in no distress, no Rash; Oral Mucosa Clear, moist. Cardiovascular: S1 and S2 Present, no Murmur, Respiratory: normal respiratory effort, Bilateral Air entry present and no Crackles, bilateral  wheezes Abdomen: Bowel Sound present, Soft and no tenderness, no hernia Extremities: no Pedal edema, no calf tenderness Neurology: alert and oriented to time, place, and person affect appropriate.  Filed Weights   08/24/19 1042 08/24/19 1806 08/27/19 0500  Weight: 93 kg 93.9 kg 93.9 kg   Vitals:   08/30/19 1130 08/30/19 1410  BP: 117/76   Pulse: (!) 109   Resp:    Temp: 97.7 F (36.5 C)   SpO2: 95% 95%    DISCHARGE MEDICATION: Allergies as of 08/30/2019      Reactions   Amoxicillin-pot Clavulanate Hives   Also vomiting   Sulfamethoxazole-trimethoprim Hives, Nausea And Vomiting      Medication List    STOP taking these medications   HYDROcodone-acetaminophen 5-325 MG tablet Commonly known as: NORCO/VICODIN     TAKE these medications   albuterol 108 (90 Base) MCG/ACT inhaler Commonly known as: VENTOLIN HFA Inhale 2 puffs into the lungs every 4 (four) hours as needed for wheezing or shortness of breath.   ALPRAZolam 0.5 MG tablet Commonly known as: XANAX Take 1 tablet (0.5 mg total) by mouth 2 (two) times daily as needed for anxiety.   Atrovent HFA 17 MCG/ACT inhaler Generic drug: ipratropium Inhale 1 puff into the lungs daily.   benzonatate 100 MG capsule Commonly known as: TESSALON Take 1 capsule (100 mg total) by mouth 3 (three) times daily.   chlorpheniramine-HYDROcodone 10-8 MG/5ML Suer Commonly known as: TUSSIONEX Take 5 mLs by mouth every 12 (twelve) hours as needed for cough.   cyclobenzaprine 10 MG tablet Commonly known as: FLEXERIL Take 10 mg by mouth 3 (three) times daily.   diltiazem 120 MG 24 hr capsule Commonly known as: CARDIZEM CD Take 1 capsule (120 mg total) by mouth daily.   divalproex 500 MG DR  tablet Commonly known as: DEPAKOTE Take 1 tablet (500 mg total) by mouth 2 (two) times daily. What changed:   how much to take  when to take this   feeding supplement (ENSURE ENLIVE) Liqd Take 237 mLs by mouth 3 (three) times daily between meals.   fluconazole 100 MG tablet Commonly known as: DIFLUCAN Take 1 tablet (100 mg total) by mouth daily.   fluticasone 50 MCG/ACT nasal spray Commonly known as: FLONASE daily as needed.   guaiFENesin 600 MG 12 hr tablet Commonly known as: MUCINEX Take 2 tablets (1,200 mg total) by mouth 2 (two) times daily.   ipratropium 0.02 % nebulizer solution Commonly known as: ATROVENT Take 2.5 mLs (0.5 mg total) by nebulization every 4 (four) hours as needed for wheezing or shortness of breath.   levalbuterol 0.63 MG/3ML nebulizer solution Commonly known as: XOPENEX Take 3 mLs (0.63 mg total) by nebulization every 8 (eight) hours as needed for wheezing or shortness of breath.   loratadine 10 MG tablet Commonly known as: CLARITIN Take 10 mg by mouth daily as needed.   meclizine 12.5 MG tablet Commonly known as: ANTIVERT Take 1 tablet (12.5 mg total) by mouth 3 (three) times daily  as needed for dizziness.   meloxicam 7.5 MG tablet Commonly known as: MOBIC Take 7.5 mg by mouth 2 (two) times daily.   montelukast 10 MG tablet Commonly known as: SINGULAIR TAKE 1 TABLET BY MOUTH AT BEDTIME   multivitamin tablet Take 1 tablet by mouth daily.   omeprazole 40 MG capsule Commonly known as: PRILOSEC Take 40 mg by mouth daily.   ondansetron 8 MG tablet Commonly known as: ZOFRAN Take 1 tablet (8 mg total) by mouth every 8 (eight) hours as needed for nausea or vomiting.   oxyCODONE-acetaminophen 5-325 MG tablet Commonly known as: PERCOCET/ROXICET Take 1 tablet by mouth every 4 (four) hours as needed for moderate pain or severe pain.   polyethylene glycol 17 g packet Commonly known as: MIRALAX / GLYCOLAX Take 17 g by mouth daily.    prochlorperazine 10 MG tablet Commonly known as: COMPAZINE Take 1 tablet (10 mg total) by mouth every 6 (six) hours as needed for nausea or vomiting.   risperiDONE 1 MG tablet Commonly known as: RISPERDAL Take 1 tablet (1 mg total) by mouth at bedtime. What changed: when to take this   salmeterol 50 MCG/DOSE diskus inhaler Commonly known as: SEREVENT Inhale 1 puff into the lungs 2 (two) times daily.   sodium chloride 1 g tablet Take 1 tablet (1 g total) by mouth 3 (three) times daily with meals.      Allergies  Allergen Reactions  . Amoxicillin-Pot Clavulanate Hives    Also vomiting  . Sulfamethoxazole-Trimethoprim Hives and Nausea And Vomiting   Discharge Instructions    Amb Referral to Palliative Care   Complete by: As directed    Discharge instructions   Complete by: As directed    It is important that you read the given instructions as well as go over your medication list with RN to help you understand your care after this hospitalization.  Please follow-up with PCP in 1-2 weeks.  Please note that NO REFILLS for any discharge medications will be authorized once you are discharged, as it is imperative that you return to your primary care physician (or establish a relationship with a primary care physician if you do not have one) for your aftercare needs so that they can reassess your need for medications and monitor your lab values.  Please request your primary care physician to go over all Hospital Tests and Procedure/Radiological results at the follow up. Please get all Hospital records sent to your PCP by signing hospital release before you go home.   Do not drive, operating heavy machinery, perform activities at heights, swimming or participation in water activities or provide baby sitting services ; until you have been seen by Primary Care Physician or a Neurologist and are cleared to do such activities.  Do not take more than prescribed Pain, Sleep and Anxiety  Medications.  You were cared for by a hospitalist during your hospital stay. If you have any questions about your discharge medications or the care you received while you were in the hospital after you are discharged, you can call the unit @UNIT @ you were admitted to and ask to speak with the hospitalist Berle Mull. Ask for Hospitalist on call if the hospitalist that took care of you is not available.   Once you are discharged, your primary care physician will handle any further medical issues.  You Must read complete instructions/literature along with all the possible adverse reactions/side effects for all the Medicines you take and that have been prescribed to  you. Take any new Medicines after you have completely understood and accept all the possible adverse reactions/side effects.  If you have smoked or chewed Tobacco in the last 2 yrs please STOP smoking If you drink alcohol, please safely  STOP the use. Do not drive, operating heavy machinery, perform activities at heights, swimming or participation in water activities or provide baby sitting services under influence.  Wear Seat belts while driving.   Increase activity slowly   Complete by: As directed       The results of significant diagnostics from this hospitalization (including imaging, microbiology, ancillary and laboratory) are listed below for reference.    Significant Diagnostic Studies: CT Chest W Contrast  Result Date: 08/05/2019 CLINICAL DATA:  Lung cancer restaging. EXAM: CT CHEST WITH CONTRAST TECHNIQUE: Multidetector CT imaging of the chest was performed during intravenous contrast administration. CONTRAST:  19mL OMNIPAQUE IOHEXOL 300 MG/ML  SOLN COMPARISON:  06/30/2018 FINDINGS: Cardiovascular: Normal heart size.  No pericardial effusion. Mediastinum/Nodes: Normal appearance of the thyroid gland. The trachea appears patent and is midline. Normal appearance of the esophagus. No axillary or supraclavicular adenopathy. Right  paratracheal lymph node measures 1.2 cm, image 49/2. This is new from previous exam. New 1.1 cm low right paratracheal lymph node, image 59/2. Lungs/Pleura: No pleural effusion. Centrilobular and paraseptal emphysema with diffuse bronchial wall thickening. Right upper lobe and right middle lobe cavitary lung mass is identified within the distribution of previously noted radiation change. On today's study this measures 8.5 x 3.7 cm, image 67/3. This invades the right hilum with narrowing of the distal right mainstem bronchus as well as the right upper lobe lobar and segmental airways. Similar appearance of radiation change involving the superior segment of right lower lobe, image 81/3 Upper Abdomen: No acute abnormality. Musculoskeletal: No chest wall abnormality. No acute or significant osseous findings. IMPRESSION: 1. Imaging findings compatible with recurrence of disease involving the right upper lobe, right middle lobe. Tumor involves the right hilum and narrows the right upper lobe airway. 2. Interval enlargement of right paratracheal lymph nodes concerning for metastatic adenopathy. Aortic Atherosclerosis (ICD10-I70.0) and Emphysema (ICD10-J43.9). Electronically Signed   By: Kerby Moors M.D.   On: 08/05/2019 13:02   MR Brain W Wo Contrast  Result Date: 08/22/2019 CLINICAL DATA:  Worsening dizziness, metastatic lung cancer EXAM: MRI HEAD WITHOUT AND WITH CONTRAST TECHNIQUE: Multiplanar, multiecho pulse sequences of the brain and surrounding structures were obtained without and with intravenous contrast. CONTRAST:  26mL GADAVIST GADOBUTROL 1 MMOL/ML IV SOLN COMPARISON:  2017 FINDINGS: Brain: There is no acute infarction or intracranial hemorrhage. There is no intracranial mass, mass effect, or edema. There is no hydrocephalus or extra-axial fluid collection. Confluent areas of T2 hyperintensity in the supratentorial white matter are nonspecific and probably reflect therapy related changes given significant  progression since 2017. No abnormal enhancement. Vascular: Major vessel flow voids at the skull base are preserved. Skull and upper cervical spine: Normal marrow signal is preserved. Sinuses/Orbits: Partial opacification of the maxillary sinuses. There is evidence of chronic right maxillary sinusitis. Orbits are unremarkable. Orbits are unremarkable. Other: Sella is unremarkable.  Mastoid air cells are clear. IMPRESSION: No evidence of intracranial metastatic disease. No acute infarction or hemorrhage. Significant progression of white matter disease since 2017 likely reflecting sequelae of therapy. Electronically Signed   By: Macy Mis M.D.   On: 08/22/2019 14:03   NM PET Image Restag (PS) Skull Base To Thigh  Result Date: 08/11/2019 CLINICAL DATA:  Subsequent treatment strategy  for small cell lung cancer. EXAM: NUCLEAR MEDICINE PET SKULL BASE TO THIGH TECHNIQUE: 10.85 mCi F-18 FDG was injected intravenously. Full-ring PET imaging was performed from the skull base to thigh after the radiotracer. CT data was obtained and used for attenuation correction and anatomic localization. Fasting blood glucose: 82 mg/dl COMPARISON:  Chest CT 08/05/2019.  Remote PET-CT 07/16/2015 FINDINGS: Mediastinal blood pool activity: SUV max 2.32 Liver activity: SUV max NA NECK: No hypermetabolic lymph nodes in the neck. Incidental CT findings: none CHEST: 6.5 x 5.5 cm cavitary mass in the right upper lobe invading the right hilum is hypermetabolic with SUV max of 25.05 consistent with recurrent tumor. Enlarged right paratracheal lymph nodes are also hypermetabolic and consistent with metastatic adenopathy. 8 mm node on image 72/3 has an SUV max of 4.99 and slightly more inferiorly there is a 12 mm node on image 78/3 which has an SUV max of 7.77. No supraclavicular or axillary adenopathy. No breast masses. Areas of hypermetabolism in the supraclavicular fossas bilaterally without associated CT correlate most consistent with  hypermetabolic brown fat. No obvious pulmonary nodules to suggest pulmonary metastatic disease. Incidental CT findings: none ABDOMEN/PELVIS: Hypermetabolic lesion in the left hepatic lobe is suspicious for a new hepatic metastatic lesion. This appears to correlate with a vague low-attenuation lesion measuring 14 mm on image 126/3. No other definite hepatic lesions. No adrenal gland lesions are identified. The pancreas, spleen and both kidneys are unremarkable. No abdominal or pelvic lymphadenopathy. Incidental CT findings: none SKELETON: No focal hypermetabolic activity to suggest skeletal metastasis. Incidental CT findings: none IMPRESSION: 1. Recurrent right upper lobe/right hilar tumor and associated adjacent metastatic mediastinal adenopathy. 2. Suspect new left hepatic lobe metastatic lesion. 3. No definite metastatic pulmonary nodules. 4. No findings for osseous metastatic disease. Electronically Signed   By: Marijo Sanes M.D.   On: 08/11/2019 15:15   DG Chest Port 1 View  Result Date: 08/27/2019 CLINICAL DATA:  SOB, constipation. Patient admitted on 08/24/2019 for hyponatremia. Patient currently undergoing treatment for lung cancer and liver cancer per patient. Hx of asthma, COPD, HTN, appendectomy, stomach surgery, abdominal hysterectomy, former smoker. EXAM: PORTABLE CHEST 1 VIEW COMPARISON:  03/08/2018.  Chest CT, 08/05/2019. FINDINGS: Ill-defined right hilar mass is similar to the recent prior chest CT, increased when compared to the prior chest radiograph. Linear/reticular opacities extend from the right hilar mass, also similar to the prior chest CT. Mild opacity at the left lung base is consistent with atelectasis. No convincing pneumonia. No pulmonary edema. No pleural effusion or pneumothorax. Cardiac silhouette is normal in size.  Normal left hilar contour. Right anterior chest wall Port-A-Cath is stable from the prior chest CT. Skeletal structures are grossly intact. IMPRESSION: 1. No acute  cardiopulmonary disease. 2. Changes on the right reflect the patient's known lung carcinoma, similar in appearance to the recent prior chest CT. No convincing pneumonia and no pulmonary edema. Electronically Signed   By: Lajean Manes M.D.   On: 08/27/2019 11:04   DG Abd Portable 1V  Result Date: 08/27/2019 CLINICAL DATA:  SOB, constipation. Patient admitted on 08/24/2019 for hyponatremia. Patient currently undergoing treatment for lung cancer and liver cancer per patient. Hx of asthma, COPD, HTN, appendectomy, stomach surgery, abdominal hysterectomy, former smoker. EXAM: PORTABLE ABDOMEN - 1 VIEW COMPARISON:  CT, 01/25/2016 FINDINGS: Normal bowel gas pattern.  Mild increased right colon stool burden. No evidence of renal or ureteral stones. Soft tissues are unremarkable. Status post right hip pinning.  No acute skeletal abnormality. IMPRESSION:  1. No acute findings.  No evidence of bowel obstruction. 2. Mild increased right colon stool burden. Electronically Signed   By: Lajean Manes M.D.   On: 08/27/2019 11:06   [place] CORE BIOPSY (LIVER)  Result Date: 08/15/2019 CLINICAL DATA:  Right lower lobe lung small-cell carcinoma. Hypermetabolic liver lesion on recent PET-CT. EXAM: ULTRASOUND-GUIDED CORE LIVER BIOPSY TECHNIQUE: An ultrasound guided liver biopsy was thoroughly discussed with the patient and questions were answered. The benefits, risks, alternatives, and complications were also discussed. The patient understands and wishes to proceed with the procedure. A verbal as well as written consent was obtained. Survey ultrasound of the liver was performed. At least 3 low-attenuation lesions measuring up to 1.5 cm were identified, in both lobes. The left lesion was inaccessible due to overlying costal cartilage. Therefore, a right anterior lesion was approached. An appropriate skin entry site was determined. Skin site was marked, prepped with chlorhexidine , and draped in usual sterile fashion, and infiltrated  locally with 1% lidocaine. Intravenous Fentanyl 153mcg and Versed 2mg  were administered as conscious sedation during continuous monitoring of the patient's level of consciousness and physiological / cardiorespiratory status by the radiology RN, with a total moderate sedation time of 35 minutes. A 17 gauge trocar needle was advanced under ultrasound guidance into the liver. 4 coaxial 18gauge core samples were then obtained through the guide needle. The guide needle was removed. Post procedure scans demonstrate no apparent complication. COMPLICATIONS: COMPLICATIONS None immediate FINDINGS: At least 3 hypoechoic liver lesions were identified. Representative core biopsy samples obtained as above. IMPRESSION: 1. Technically successful ultrasound guided core liver lesion biopsy. Electronically Signed   By: Lucrezia Europe M.D.   On: 08/15/2019 12:08    Microbiology: Recent Results (from the past 240 hour(s))  SARS CORONAVIRUS 2 (TAT 6-24 HRS) Nasopharyngeal Nasopharyngeal Swab     Status: None   Collection Time: 08/24/19  1:37 PM   Specimen: Nasopharyngeal Swab  Result Value Ref Range Status   SARS Coronavirus 2 NEGATIVE NEGATIVE Final    Comment: (NOTE) SARS-CoV-2 target nucleic acids are NOT DETECTED. The SARS-CoV-2 RNA is generally detectable in upper and lower respiratory specimens during the acute phase of infection. Negative results do not preclude SARS-CoV-2 infection, do not rule out co-infections with other pathogens, and should not be used as the sole basis for treatment or other patient management decisions. Negative results must be combined with clinical observations, patient history, and epidemiological information. The expected result is Negative. Fact Sheet for Patients: SugarRoll.be Fact Sheet for Healthcare Providers: https://www.woods-mathews.com/ This test is not yet approved or cleared by the [place] FDA and  has been authorized for  detection and/or diagnosis of SARS-CoV-2 by FDA under an Emergency Use Authorization (EUA). This EUA will remain  in effect (meaning this test can be used) for the duration of the COVID-19 declaration under Section 56 4(b)(1) of the Act, 21 U.S.C. section 360bbb-3(b)(1), unless the authorization is terminated or revoked sooner. Performed at [facility], [place] [address], [place], [place] [address]   Urine Culture     Status: Abnormal   Collection Time: 08/24/19  1:37 PM   Specimen: Urine, Random  Result Value Ref Range Status   Specimen Description   Final    URINE, RANDOM Performed at [facility], [address] [place], [place] [address]    Special Requests   Final    NONE Performed at [facility], [address] [place], [place] [address]  Culture >=100,000 COLONIES/mL PROTEUS MIRABILIS (A)  Final   Report Status 08/26/2019 FINAL  Final   Organism ID, Bacteria PROTEUS MIRABILIS (A)  Final      Susceptibility   Proteus mirabilis - MIC*    AMPICILLIN <=2 SENSITIVE Sensitive     CEFAZOLIN <=4 SENSITIVE Sensitive     CEFTRIAXONE <=0.25 SENSITIVE Sensitive     CIPROFLOXACIN <=0.25 SENSITIVE Sensitive     GENTAMICIN <=1 SENSITIVE Sensitive     IMIPENEM 2 SENSITIVE Sensitive     NITROFURANTOIN 128 RESISTANT Resistant     TRIMETH/SULFA <=20 SENSITIVE Sensitive     AMPICILLIN/SULBACTAM <=2 SENSITIVE Sensitive     PIP/TAZO <=4 SENSITIVE Sensitive     * >=100,000 COLONIES/mL PROTEUS MIRABILIS  Respiratory Panel by RT PCR (Flu A&B, Covid) - Nasopharyngeal Swab     Status: None   Collection Time: 08/24/19  4:13 PM   Specimen: Nasopharyngeal Swab  Result Value Ref Range Status   SARS Coronavirus 2 by RT PCR NEGATIVE NEGATIVE Final    Comment: (NOTE) SARS-CoV-2 target nucleic acids are NOT DETECTED. The SARS-CoV-2 RNA is generally detectable in upper respiratoy specimens during the acute phase of infection. The lowest concentration of SARS-CoV-2 viral  copies this assay can detect is 131 copies/mL. A negative result does not preclude SARS-Cov-2 infection and should not be used as the sole basis for treatment or other patient management decisions. A negative result may occur with  improper specimen collection/handling, submission of specimen other than nasopharyngeal swab, presence of viral mutation(s) within the areas targeted by this assay, and inadequate number of viral copies (<131 copies/mL). A negative result must be combined with clinical observations, patient history, and epidemiological information. The expected result is Negative. Fact Sheet for Patients:  PinkCheek.be Fact Sheet for Healthcare Providers:  GravelBags.it This test is not yet ap proved or cleared by the [place] FDA and  has been authorized for detection and/or diagnosis of SARS-CoV-2 by FDA under an Emergency Use Authorization (EUA). This EUA will remain  in effect (meaning this test can be used) for the duration of the COVID-19 declaration under Section 564(b)(1) of the Act, 21 U.S.C. section 360bbb-3(b)(1), unless the authorization is terminated or revoked sooner.    Influenza A by PCR NEGATIVE NEGATIVE Final   Influenza B by PCR NEGATIVE NEGATIVE Final    Comment: (NOTE) The Xpert Xpress SARS-CoV-2/FLU/RSV assay is intended as an aid in  the diagnosis of influenza from Nasopharyngeal swab specimens and  should not be used as a sole basis for treatment. Nasal washings and  aspirates are unacceptable for Xpert Xpress SARS-CoV-2/FLU/RSV  testing. Fact Sheet for Patients: PinkCheek.be Fact Sheet for Healthcare Providers: GravelBags.it This test is not yet approved or cleared by the [place] FDA and  has been authorized for detection and/or diagnosis of SARS-CoV-2 by  FDA under an Emergency Use Authorization (EUA). This EUA will  remain  in effect (meaning this test can be used) for the duration of the  Covid-19 declaration under Section 564(b)(1) of the Act, 21  U.S.C. section 360bbb-3(b)(1), unless the authorization is  terminated or revoked. Performed at [facility], [address] [place], [place] [address]   MRSA PCR Screening     Status: Abnormal   Collection Time: 08/24/19  6:10 PM   Specimen: Nasopharyngeal  Result Value Ref Range Status   MRSA by PCR POSITIVE (A) NEGATIVE Final    Comment:        The GeneXpert MRSA Assay (FDA  approved for NASAL specimens only), is one component of a comprehensive MRSA colonization surveillance program. It is not intended to diagnose MRSA infection nor to guide or monitor treatment for MRSA infections. RESULT CALLED TO, READ BACK BY AND VERIFIED WITH: KRISTY ADELOWO AT 1950 08/24/2019  TFK Performed at [facility], [address] [place], [place] [address]   CULTURE, BLOOD (ROUTINE X 2) w Reflex to ID Panel     Status: None   Collection Time: 08/24/19  6:16 PM   Specimen: BLOOD  Result Value Ref Range Status   Specimen Description BLOOD LEFT ANTECUBITAL  Final   Special Requests   Final    BOTTLES DRAWN AEROBIC AND ANAEROBIC Blood Culture adequate volume   Culture   Final    NO GROWTH 5 DAYS Performed at [facility], [address] [place], [place] [address]    Report Status 08/29/2019 FINAL  Final  CULTURE, BLOOD (ROUTINE X 2) w Reflex to ID Panel     Status: None   Collection Time: 08/24/19  6:23 PM   Specimen: BLOOD  Result Value Ref Range Status   Specimen Description BLOOD BLOOD RIGHT HAND  Final   Special Requests   Final    BOTTLES DRAWN AEROBIC AND ANAEROBIC Blood Culture adequate volume   Culture   Final    NO GROWTH 5 DAYS Performed at [facility], [address], [place], [place] [address]    Report Status 08/29/2019 FINAL  Final     Labs: CBC: Recent Labs  Lab 08/24/19 0936  08/24/19 1044 08/26/19 0841 08/27/19 0501 08/28/19 0459 08/29/19 0659 08/30/19 0659  WBC 14.8*   < > 1.9* 1.0* 1.5* 3.5* 8.6  NEUTROABS 13.5*  --  1.0*  --   --  1.6* 5.2  HGB 11.2*   < > 9.6* 8.9* 8.7* 8.6* 8.8*  HCT 31.7*   < > 29.4* 27.3* 26.1* 27.1* 26.8*  MCV 85.2   < > 90.7 90.7 90.9 93.8 93.7  PLT 225   < > 160 129* 104* 83* 80*   < > = values in this interval not displayed.   Basic Metabolic Panel: Recent Labs  Lab 08/24/19 1445 08/24/19 1700 08/25/19 0511 08/25/19 1837 08/26/19 0243 08/26/19 0841 08/28/19 0459 08/28/19 0940 08/28/19 1734 08/28/19 2115 08/29/19 0109 08/29/19 0659 08/30/19 0659  NA 112*   < > 120*   < > 128*   < > 130*   < > 127* 129* 128* 130* 132*  K  --   --  3.0*  --  4.3  --  3.9  --   --   --   --  4.2 4.2  CL  --   --  86*  --  97*  --  95*  --   --   --   --  91* 93*  CO2  --   --  27  --  25  --  29  --   --   --   --  31 31  GLUCOSE  --   --  104*  --  102*  --  102*  --   --   --   --  92 98  BUN  --   --  5*  --  9  --  7  --   --   --   --  9 10  CREATININE  --   --  0.34*  --  0.51  --  0.45  --   --   --   --  0.53 0.35*  CALCIUM  --   --  7.7*  --  8.0*  --  8.3*  --   --   --   --  8.5* 8.7*  MG 2.0  --   --   --  1.9  --  2.0  --   --   --   --  2.1 2.0   < > = values in this interval not displayed.   Liver Function Tests: No results for input(s): AST, ALT, ALKPHOS, BILITOT, PROT, ALBUMIN in the last 168 hours. No results for input(s): LIPASE, AMYLASE in the last 168 hours. Recent Labs  Lab 08/29/19 1227  AMMONIA 41*   Cardiac Enzymes: No results for input(s): CKTOTAL, CKMB, CKMBINDEX, TROPONINI in the last 168 hours. BNP (last 3 results) No results for input(s): BNP in the last 8760 hours. CBG: Recent Labs  Lab 08/24/19 1807  GLUCAP 151*    Time spent: 35 minutes  Signed:  Berle Mull  [facility] 08/30/2019 12:43 AM

## 2019-08-31 NOTE — Telephone Encounter (Signed)
VERBAL ORDER called to [facility] for approval of services

## 2019-08-31 NOTE — Telephone Encounter (Signed)
[place] with me. Thanks GB

## 2019-08-31 NOTE — Telephone Encounter (Signed)
[facility] called asking for order approval for patient for services 2 week 4, 1 week 4. Please avise

## 2019-09-01 ENCOUNTER — Encounter: Payer: Self-pay | Admitting: Primary Care

## 2019-09-01 ENCOUNTER — Encounter: Payer: Self-pay | Admitting: Internal Medicine

## 2019-09-01 ENCOUNTER — Other Ambulatory Visit: Payer: Medicare Other | Admitting: Primary Care

## 2019-09-01 ENCOUNTER — Other Ambulatory Visit: Payer: Self-pay

## 2019-09-01 DIAGNOSIS — Z515 Encounter for palliative care: Secondary | ICD-10-CM

## 2019-09-01 NOTE — Progress Notes (Signed)
Designer, [facility]ellery Palliative Care Consult Note Telephone: [phone]  Fax: [phone]    PATIENT NAME: Charlene [name] [address] [place] [address] [phone] (home)  DOB: 04/13/1961 MRN: [id number]  PRIMARY CARE PROVIDER:   System, Pcp Not In, No address on file None  REFERRING PROVIDER:  Borders, Kirt Boys, NP [place],  [place] [id number] [phone]  RESPONSIBLE PARTY:   Extended Emergency Contact Information Primary Emergency Contact: Hall,James L Address: [address]          [place], [place] [address] [place] Phone: [phone] Mobile Phone: [phone] Relation: Father Secondary Emergency Contact: Charolett Bumpers [place] Mobile Phone: [phone] Relation: Brother   ASSESSMENT AND RECOMMENDATIONS:   1. Advance Care Planning/Goals of Care: Goals include to maximize quality of life and symptom management. I met with Ms Goecke in her home. She recounts her cancer dx and current treatment options. She became teary as she recounted < 1 year prognosis. She began some chemo but has been hospitalized recently for hyponatremia. We discussed her goals of care and advance directives. She's naming her father her POA and today we did the MOST form, uploaded to [facility].  2. Symptom Management:   Cessation: States she smokes only once every 3-4  Days but daughter states she smokes 2 a day. We discussed benefits of cessation at any point.  Nutrition: Good intake for  food and supplements. Has family living with her and they help cook. Weight is stable.  Nebulizer: States machine is broken and she cannot do nebs. I got it out and set it up and it works fine. Education provided for hygiene and use. Has oxygen 2 L from [employer]. Does not have Cpap or bipap. [employer] is to come out to service and hopefully bring portable tanks tomorrow.  3. Family /Caregiver/Community Supports: Lives with parents and grand  children in family home. Has a church community.  4. Cognitive / Functional decline: A and O x 3. Drowsy at times. Uses walker for ambulation. Denies recent falls. Able to do most adls and needs help with iadls.  5. Follow up Palliative Care Visit: Palliative care will continue to follow for goals of care clarification and symptom management. Return 4-6 weeks or prn.  I spent 60 minutes providing this consultation,  from 1100 to 1200. More than 50% of the time in this consultation was spent coordinating communication.   HISTORY OF PRESENT ILLNESS:  ANHELICA FOWERS is a 59 y.o. year old female with multiple medical problems including lung cancer with metastatic disease, tobacco abuse, bipolar disorder. Palliative Care was asked to follow this patient by consultation request of Borders, Kirt Boys, NP to help address advance care planning and goals of care. This is a follow up visit.  CODE STATUS: DNR, MOST with limited scope, use of abx, limited use of iv and feeding tube.  PPS: 40% HOSPICE ELIGIBILITY/DIAGNOSIS: TBD  PAST MEDICAL HISTORY:  Past Medical History:  Diagnosis Date  . Asthma   . Bipolar 2 disorder ([place])   . Borderline schizophrenia ([place])   . COPD (chronic obstructive pulmonary disease) ([place])   . Falls infrequently   . GERD (gastroesophageal reflux disease)   . Hypertension   . Port-A-Cath in place   . Primary cancer of right lower lobe of lung ([place]) 07/12/2015   Small cell undifferentiated carcinoma of lung.  Diagnosis at [facility] by fine-needle aspiration of lymph node (January,  2017)  . Ulcer     SOCIAL HX:  Social History   Socioeconomic History  . Marital status: Divorced    Spouse name: Not on file  . Number of children: Not on file  . Years of education: Not on file  . Highest education level: Not on file  Occupational History  . Not on file  Tobacco Use  . Smoking status: Current Every Day Smoker    Packs/day: 0.10    Years: 30.00    Pack years:  3.00    Types: Cigarettes  . Smokeless tobacco: Never Used  Substance and Sexual Activity  . Alcohol use: No    Alcohol/week: 0.0 standard drinks  . Drug use: Yes    Types: Cocaine    Comment: States due to cocaine in mouthwash  . Sexual activity: Not on file  Other Topics Concern  . Not on file  Social History Narrative  . Not on file   Social Determinants of Health   Financial Resource Strain:   . Difficulty of Paying Living Expenses:   Food Insecurity:   . Worried About Charity fundraiser in the Last Year:   . Arboriculturist in the Last Year:   Transportation Needs:   . Film/video editor (Medical):   Marland Kitchen Lack of Transportation (Non-Medical):   Physical Activity:   . Days of Exercise per Week:   . Minutes of Exercise per Session:   Stress:   . Feeling of Stress :   Social Connections:   . Frequency of Communication with Friends and Family:   . Frequency of Social Gatherings with Friends and Family:   . Attends Religious Services:   . Active Member of Clubs or Organizations:   . Attends Archivist Meetings:   Marland Kitchen Marital Status:   Intimate Partner Violence:   . Fear of Current or Ex-Partner:   . Emotionally Abused:   Marland Kitchen Physically Abused:   . Sexually Abused:      ALLERGIES:  Allergies  Allergen Reactions  . Amoxicillin-Pot Clavulanate Hives    Also vomiting  . Sulfamethoxazole-Trimethoprim Hives and Nausea And Vomiting     PERTINENT MEDICATIONS:  Outpatient Encounter Medications as of 09/01/2019  Medication Sig  . albuterol (PROVENTIL HFA;VENTOLIN HFA) 108 (90 Base) MCG/ACT inhaler Inhale 2 puffs into the lungs every 4 (four) hours as needed for wheezing or shortness of breath.  . ALPRAZolam (XANAX) 0.5 MG tablet Take 1 tablet (0.5 mg total) by mouth 2 (two) times daily as needed for anxiety.  . ATROVENT HFA 17 MCG/ACT inhaler Inhale 1 puff into the lungs daily.  . benzonatate (TESSALON) 100 MG capsule Take 1 capsule (100 mg total) by mouth 3  (three) times daily.  . chlorpheniramine-HYDROcodone (TUSSIONEX) 10-8 MG/5ML SUER Take 5 mLs by mouth every 12 (twelve) hours as needed for cough.  . cyclobenzaprine (FLEXERIL) 10 MG tablet Take 10 mg by mouth 3 (three) times daily.   Marland Kitchen diltiazem (CARDIZEM CD) 120 MG 24 hr capsule Take 1 capsule (120 mg total) by mouth daily.  . divalproex (DEPAKOTE) 500 MG DR tablet Take 1 tablet (500 mg total) by mouth 2 (two) times daily.  . feeding supplement, ENSURE ENLIVE, (ENSURE ENLIVE) LIQD Take 237 mLs by mouth 3 (three) times daily between meals.  . fluconazole (DIFLUCAN) 100 MG tablet Take 1 tablet (100 mg total) by mouth daily.  . fluticasone (FLONASE) 50 MCG/ACT nasal spray daily as needed.   Marland Kitchen guaiFENesin (MUCINEX) 600 MG  12 hr tablet Take 2 tablets (1,200 mg total) by mouth 2 (two) times daily.  Marland Kitchen loratadine (CLARITIN) 10 MG tablet Take 10 mg by mouth daily as needed.   . meclizine (ANTIVERT) 12.5 MG tablet Take 1 tablet (12.5 mg total) by mouth 3 (three) times daily as needed for dizziness.  . meloxicam (MOBIC) 7.5 MG tablet Take 7.5 mg by mouth 2 (two) times daily.  . montelukast (SINGULAIR) 10 MG tablet TAKE 1 TABLET BY MOUTH AT BEDTIME  . Multiple Vitamin (MULTIVITAMIN) tablet Take 1 tablet by mouth daily.  Marland Kitchen omeprazole (PRILOSEC) 40 MG capsule Take 40 mg by mouth daily.   . ondansetron (ZOFRAN) 8 MG tablet Take 1 tablet (8 mg total) by mouth every 8 (eight) hours as needed for nausea or vomiting.  Marland Kitchen oxyCODONE-acetaminophen (PERCOCET/ROXICET) 5-325 MG tablet Take 1 tablet by mouth every 4 (four) hours as needed for moderate pain or severe pain.  . polyethylene glycol (MIRALAX / GLYCOLAX) 17 g packet Take 17 g by mouth daily.  . prochlorperazine (COMPAZINE) 10 MG tablet Take 1 tablet (10 mg total) by mouth every 6 (six) hours as needed for nausea or vomiting.  . risperiDONE (RISPERDAL) 1 MG tablet Take 1 tablet (1 mg total) by mouth at bedtime. (Patient taking differently: Take 1 mg by mouth 2  (two) times daily. )  . salmeterol (SEREVENT) 50 MCG/DOSE diskus inhaler Inhale 1 puff into the lungs 2 (two) times daily.  . sodium chloride 1 g tablet Take 1 tablet (1 g total) by mouth 3 (three) times daily with meals.  . [DISCONTINUED] ipratropium (ATROVENT) 0.02 % nebulizer solution Take 2.5 mLs (0.5 mg total) by nebulization every 4 (four) hours as needed for wheezing or shortness of breath. (Patient not taking: Reported on 09/01/2019)  . [DISCONTINUED] levalbuterol (XOPENEX) 0.63 MG/3ML nebulizer solution Take 3 mLs (0.63 mg total) by nebulization every 8 (eight) hours as needed for wheezing or shortness of breath. (Patient not taking: Reported on 09/01/2019)   No facility-administered encounter medications on file as of 09/01/2019.     PHYSICAL EXAM / ROS:   Current and past weights: baseline was 204 but now 199 lbs.  General: NAD, frail appearing, WNWD Cardiovascular: S1S2, RRR, no chest pain reported, 1+ edema Pulmonary: + cough and pain from cough, states worse a hs, no increased SOB, uses oxygen at 2 L, nebulizer rx prn, CTA, diminished sounds Abdomen: appetite fair to good, endorses constipation, continent of bowel GU: denies dysuria, continent of urine MSK:  no joint deformities, ambulatory with walker Skin: no rashes or wounds reported Neurological: Laurena Bering is disrupted. Sometimes has insomnia and has xanax. Endorses pain in back and hips. Sees pain management Dr. Cristy Folks at [facility]. Jason Coop, NP [facility]  COVID-19 PATIENT SCREENING TOOL  Person answering questions: ___________Mrs Taylor______ _____   1.  Is the patient or any family member in the home showing any signs or symptoms regarding respiratory infection?               Person with Symptom- __________NA_________________  a. Fever                                                                          Yes___ No___  ___________________  b. Shortness of breath                                                     Yes___ No___          ___________________ c. Cough/congestion                                       Yes___  No___         ___________________ d. Body aches/pains                                                         Yes___ No___        ____________________ e. Gastrointestinal symptoms (diarrhea, nausea)           Yes___ No___        ____________________  2. Within the past 14 days, has anyone living in the home had any contact with someone with or under investigation for COVID-19?    Yes___ No_X_   Person __________________

## 2019-09-05 ENCOUNTER — Inpatient Hospital Stay (HOSPITAL_BASED_OUTPATIENT_CLINIC_OR_DEPARTMENT_OTHER): Payer: Medicare Other | Admitting: Hospice and Palliative Medicine

## 2019-09-05 ENCOUNTER — Telehealth: Payer: Self-pay | Admitting: *Deleted

## 2019-09-05 ENCOUNTER — Other Ambulatory Visit: Payer: Self-pay | Admitting: *Deleted

## 2019-09-05 ENCOUNTER — Telehealth: Payer: Self-pay

## 2019-09-05 ENCOUNTER — Telehealth: Payer: Self-pay | Admitting: Primary Care

## 2019-09-05 DIAGNOSIS — E871 Hypo-osmolality and hyponatremia: Secondary | ICD-10-CM

## 2019-09-05 DIAGNOSIS — Z515 Encounter for palliative care: Secondary | ICD-10-CM | POA: Diagnosis not present

## 2019-09-05 DIAGNOSIS — R29898 Other symptoms and signs involving the musculoskeletal system: Secondary | ICD-10-CM | POA: Diagnosis not present

## 2019-09-05 DIAGNOSIS — C3490 Malignant neoplasm of unspecified part of unspecified bronchus or lung: Secondary | ICD-10-CM

## 2019-09-05 NOTE — Telephone Encounter (Signed)
Call from Charlene [name]'s mother. Returned call to [employer]. States her pain is out of control and they are determining if she can go to the treatments this week. Currently is taking percocet which is prescribed from oncology  q 4 hrs. She's been taking one in the am, and a half twice more during the day.I advised her to take q 6 hrs at least and treat for constipation.  She has 18 pills left. I asked her to see if every  6 hours helps. She can see if she can still go to treatments on Wed. And if not, we can make a new plan. I will make home visit tomorrow afternoon to assess if she is unable to go to clinic appointment.

## 2019-09-05 NOTE — Telephone Encounter (Signed)
Mother Enid Derry called reporting that patient was discharged from hospital and that she is unable to walk and is not doing well. She states she cannot help patient much as she is 77 and just got out of hospital herself. Patient is refusing to go to ER or hospital. She is asking for help and would also like to speak with J Borders as well. Please advise

## 2019-09-05 NOTE — Telephone Encounter (Signed)
Discussed care with Billey Chang, NP. He will do a virtual visit with patient/family to discuss goals of care.   Per Dr. Rogue Bussing - md would like to cnl her chemotherapy apts this week. He only wants the patient to be set up for iv fluids on Wednesday. Colette, Please make adjustments in patient's schedule. Ok to cnl the chemotherapy and add patient's Josh's schedule today for virtual visit.

## 2019-09-05 NOTE — Progress Notes (Signed)
Virtual Visit via Telephone Note  I connected with Charlene [name] on 09/05/19 at  1:00 PM EDT by telephone and verified that I am speaking with the correct person using two identifiers.   I discussed the limitations, risks, security and privacy concerns of performing an evaluation and management service by telephone and the availability of in person appointments. I also discussed with the patient that there may be a patient responsible charge related to this service. The patient expressed understanding and agreed to proceed.   History of Present Illness: Charlene [name] is a 59 y.o. 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  Status post carbo/etoposide/Tecentriq (last treated on 08/17/2019).  Patient has had diarrhea secondary to treatment.  She has had progressive weakness.  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 Patient was referred to palliative care to help address goals and manage ongoing symptoms.   Observations/Objective: Patient was an add-on to my schedule today due to patient/family request.  I called and spoke with patient by phone.  She reports progressive weakness since discharging home from the hospital on 3/16.  She says that she still ambulatory but it is challenging.  I worry about recurrent hyponatremia.  It sounds like her oral intake is poor.  Patient says that she is not interested in going back to the hospital or to the ER.  Offered her clinic evaluation today but she declined.  She thinks she may be able to get to the clinic tomorrow and I will tentatively schedule an [facility] visit at that time.  I discussed her overall goals.  Patient was hopeful that she would be able to have treatment this week but her condition is clearly such that she would not tolerate.  We discussed the  option of hospice involvement.  Patient says she is undecided.  I attempted several times to call patient's father without success.  We will keep trying to reach him.  Assessment and Plan: Stage IV SCLC -on chemotherapy but likely will have to hold further treatments due to decline.  Patient would probably benefit from hospice involvement at this point.  Weakness -likely sequelae from disease.  I do worry about recurrent hyponatremia.  We will schedule [facility] visit tomorrow to check labs with plan to give fluids if needed  ACP -patient is a DNR/DNI with MOST form in [place]  Case and plan discussed with Dr. Rogue Bussing  Follow Up Instructions: [facility] visit tomorrow   I discussed the assessment and treatment plan with the patient. The patient was provided an opportunity to ask questions and all were answered. The patient agreed with the plan and demonstrated an understanding of the instructions.   The patient was advised to call back or seek an in-person evaluation if the symptoms worsen or if the condition fails to improve as anticipated.  I provided 10 minutes of non-face-to-face time during this encounter.   Irean Hong, NP

## 2019-09-06 ENCOUNTER — Other Ambulatory Visit: Payer: Self-pay

## 2019-09-06 ENCOUNTER — Inpatient Hospital Stay: Payer: Medicare Other

## 2019-09-06 ENCOUNTER — Inpatient Hospital Stay (HOSPITAL_BASED_OUTPATIENT_CLINIC_OR_DEPARTMENT_OTHER): Payer: Medicare Other | Admitting: Hospice and Palliative Medicine

## 2019-09-06 ENCOUNTER — Other Ambulatory Visit: Payer: Medicare Other | Admitting: Primary Care

## 2019-09-06 VITALS — BP 93/67 | HR 95 | Temp 95.8°F

## 2019-09-06 DIAGNOSIS — I951 Orthostatic hypotension: Secondary | ICD-10-CM | POA: Diagnosis not present

## 2019-09-06 DIAGNOSIS — Z87891 Personal history of nicotine dependence: Secondary | ICD-10-CM | POA: Diagnosis not present

## 2019-09-06 DIAGNOSIS — Z95828 Presence of other vascular implants and grafts: Secondary | ICD-10-CM

## 2019-09-06 DIAGNOSIS — C3490 Malignant neoplasm of unspecified part of unspecified bronchus or lung: Secondary | ICD-10-CM | POA: Diagnosis not present

## 2019-09-06 DIAGNOSIS — R197 Diarrhea, unspecified: Secondary | ICD-10-CM | POA: Diagnosis not present

## 2019-09-06 DIAGNOSIS — R42 Dizziness and giddiness: Secondary | ICD-10-CM | POA: Diagnosis not present

## 2019-09-06 DIAGNOSIS — M549 Dorsalgia, unspecified: Secondary | ICD-10-CM | POA: Diagnosis not present

## 2019-09-06 DIAGNOSIS — E871 Hypo-osmolality and hyponatremia: Secondary | ICD-10-CM

## 2019-09-06 DIAGNOSIS — G893 Neoplasm related pain (acute) (chronic): Secondary | ICD-10-CM | POA: Diagnosis not present

## 2019-09-06 DIAGNOSIS — E222 Syndrome of inappropriate secretion of antidiuretic hormone: Secondary | ICD-10-CM | POA: Diagnosis not present

## 2019-09-06 DIAGNOSIS — C3431 Malignant neoplasm of lower lobe, right bronchus or lung: Secondary | ICD-10-CM | POA: Diagnosis present

## 2019-09-06 DIAGNOSIS — Z79899 Other long term (current) drug therapy: Secondary | ICD-10-CM | POA: Diagnosis not present

## 2019-09-06 DIAGNOSIS — R5382 Chronic fatigue, unspecified: Secondary | ICD-10-CM | POA: Diagnosis not present

## 2019-09-06 DIAGNOSIS — R0600 Dyspnea, unspecified: Secondary | ICD-10-CM

## 2019-09-06 DIAGNOSIS — J449 Chronic obstructive pulmonary disease, unspecified: Secondary | ICD-10-CM | POA: Diagnosis not present

## 2019-09-06 DIAGNOSIS — Z7189 Other specified counseling: Secondary | ICD-10-CM

## 2019-09-06 DIAGNOSIS — Z923 Personal history of irradiation: Secondary | ICD-10-CM | POA: Diagnosis not present

## 2019-09-06 DIAGNOSIS — M25559 Pain in unspecified hip: Secondary | ICD-10-CM | POA: Diagnosis not present

## 2019-09-06 DIAGNOSIS — Z9221 Personal history of antineoplastic chemotherapy: Secondary | ICD-10-CM | POA: Diagnosis not present

## 2019-09-06 DIAGNOSIS — Z5111 Encounter for antineoplastic chemotherapy: Secondary | ICD-10-CM | POA: Diagnosis present

## 2019-09-06 DIAGNOSIS — Z7951 Long term (current) use of inhaled steroids: Secondary | ICD-10-CM | POA: Diagnosis not present

## 2019-09-06 DIAGNOSIS — Z90710 Acquired absence of both cervix and uterus: Secondary | ICD-10-CM | POA: Diagnosis not present

## 2019-09-06 DIAGNOSIS — I10 Essential (primary) hypertension: Secondary | ICD-10-CM | POA: Diagnosis not present

## 2019-09-06 DIAGNOSIS — Z515 Encounter for palliative care: Secondary | ICD-10-CM | POA: Diagnosis not present

## 2019-09-06 DIAGNOSIS — Z5112 Encounter for antineoplastic immunotherapy: Secondary | ICD-10-CM | POA: Diagnosis present

## 2019-09-06 DIAGNOSIS — F3181 Bipolar II disorder: Secondary | ICD-10-CM | POA: Diagnosis not present

## 2019-09-06 LAB — CBC WITH DIFFERENTIAL/PLATELET
Abs Immature Granulocytes: 1.02 10*3/uL — ABNORMAL HIGH (ref 0.00–0.07)
Basophils Absolute: 0 10*3/uL (ref 0.0–0.1)
Basophils Relative: 0 %
Eosinophils Absolute: 0 10*3/uL (ref 0.0–0.5)
Eosinophils Relative: 0 %
HCT: 29.2 % — ABNORMAL LOW (ref 36.0–46.0)
Hemoglobin: 9.7 g/dL — ABNORMAL LOW (ref 12.0–15.0)
Immature Granulocytes: 9 %
Lymphocytes Relative: 12 %
Lymphs Abs: 1.4 10*3/uL (ref 0.7–4.0)
MCH: 30.6 pg (ref 26.0–34.0)
MCHC: 33.2 g/dL (ref 30.0–36.0)
MCV: 92.1 fL (ref 80.0–100.0)
Monocytes Absolute: 0.9 10*3/uL (ref 0.1–1.0)
Monocytes Relative: 8 %
Neutro Abs: 8.1 10*3/uL — ABNORMAL HIGH (ref 1.7–7.7)
Neutrophils Relative %: 71 %
Platelets: 265 10*3/uL (ref 150–400)
RBC: 3.17 MIL/uL — ABNORMAL LOW (ref 3.87–5.11)
RDW: 16.8 % — ABNORMAL HIGH (ref 11.5–15.5)
WBC: 11.4 10*3/uL — ABNORMAL HIGH (ref 4.0–10.5)
nRBC: 0 % (ref 0.0–0.2)

## 2019-09-06 LAB — COMPREHENSIVE METABOLIC PANEL
ALT: 15 U/L (ref 0–44)
AST: 26 U/L (ref 15–41)
Albumin: 3.1 g/dL — ABNORMAL LOW (ref 3.5–5.0)
Alkaline Phosphatase: 103 U/L (ref 38–126)
Anion gap: 10 (ref 5–15)
BUN: 8 mg/dL (ref 6–20)
CO2: 25 mmol/L (ref 22–32)
Calcium: 8.5 mg/dL — ABNORMAL LOW (ref 8.9–10.3)
Chloride: 93 mmol/L — ABNORMAL LOW (ref 98–111)
Creatinine, Ser: 0.53 mg/dL (ref 0.44–1.00)
GFR calc Af Amer: >60 mL/min (ref >60)
GFR calc non Af Amer: >60 mL/min (ref >60)
Glucose, Bld: 101 mg/dL — ABNORMAL HIGH (ref 70–99)
Potassium: 4.3 mmol/L (ref 3.5–5.1)
Sodium: 128 mmol/L — ABNORMAL LOW (ref 135–145)
Total Bilirubin: 0.2 mg/dL — ABNORMAL LOW (ref 0.3–1.2)
Total Protein: 6 g/dL — ABNORMAL LOW (ref 6.5–8.1)

## 2019-09-06 MED ORDER — SODIUM CHLORIDE 0.9% FLUSH
10.0000 mL | Freq: Once | INTRAVENOUS | Status: AC
  Administered 2019-09-06: 10 mL via INTRAVENOUS
  Filled 2019-09-06: qty 10

## 2019-09-06 NOTE — Progress Notes (Signed)
[place]  [phone] [phone]   Name: Charlene [name] Date: 09/06/2019 MRN: [id number]  DOB: July 20, 1960  Patient Care Team: Patient, No Pcp Per as PCP - General (General Practice) Nadene Rubins, DO as Referring Physician (Physical Medicine and Rehabilitation) Jason Coop, NP as Nurse Practitioner Cammie Sickle, MD as Consulting Physician (Internal Medicine) Temitope Griffing, Kirt Boys, NP as Nurse Practitioner (Hospice and Palliative Medicine)    REASON FOR CONSULTATION: Charlene [name] is a 59 y.o. 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  Status post carbo/etoposide/Tecentriq (last treated on 08/17/2019).  Patient has had diarrhea secondary to treatment.  She has had progressive weakness.  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  SOCIAL HISTORY:     reports that she has been smoking cigarettes. She has a 3.00 pack-year smoking history. She has never used smokeless tobacco. She reports current drug use. Drug: Cocaine. She reports that she does not drink alcohol.   Patient is unmarried.  She lives at home with her parents.  She has a daughter who lives nearby.  Patient has been disabled many years but previously worked as a Educational psychologist.  ADVANCE DIRECTIVES:  Does not have  CODE STATUS: DNR/DNI (DNR form completed on 08/24/2019)  PAST MEDICAL HISTORY: Past Medical History:  Diagnosis Date  . Asthma   . Bipolar 2 disorder ([place])   . Borderline schizophrenia ([place])   . COPD (chronic obstructive pulmonary disease) ([place])   . Falls infrequently   . GERD (gastroesophageal reflux disease)   . Hypertension   .  Port-A-Cath in place   . Primary cancer of right lower lobe of lung ([place]) 07/12/2015   Small cell undifferentiated carcinoma of lung.  Diagnosis at [facility] by fine-needle aspiration of lymph node (January, 2017)  . Ulcer     PAST SURGICAL HISTORY:  Past Surgical History:  Procedure Laterality Date  . ABDOMINAL HYSTERECTOMY    . APPENDECTOMY    . borderline schizophrenia    . chronic back pain    . HIP PINNING,CANNULATED Right 12/08/2017   Procedure: CANNULATED HIP PINNING;  Surgeon: Thornton Park, MD;  Location: [facility] ORS;  Service: Orthopedics;  Laterality: Right;  . NASAL SINUS SURGERY     x2  . PERIPHERAL VASCULAR CATHETERIZATION N/A 07/30/2015   Procedure: Glori Luis Cath Insertion;  Surgeon: Algernon Huxley, MD;  Location: [place] [facility];  Service: Cardiovascular;  Laterality: N/A;  . STOMACH SURGERY      HEMATOLOGY/ONCOLOGY HISTORY:  Oncology History Overview Note   cancer of right lower lobe of lung (small cell undifferentiated tumor) diagnosis on July 05 2015) at [facility] by bronchoscopy.  Needle aspiration of lymph node was positive for small cell carcinoma of lung. Clinically  Staged  As  T1 N2 M0 tumor.     2.PET scan shows localized disease. MRI of brain is negative for any metastases   3.  Chemotherapy with cis-platinum and VP-16 has been started on July 18, 2015 4.starting radiation therapy to the chest from August 23 2015 .   5.Last chemotherapy (fourth cycle) October 03, 2015 patient has finished radiation therapy.  # AUG 2017- CT C/A/P- NED- stable RML- scarring;   # FEB 2021- RECURRENCE; liver  bx- pending; MARCH 3rd 2021- CARBO-ETOP-TECEN  ---------------------------------------------------------------    DIAGNOSIS:  SMALL CELL LUNG CA  STAGE: IV ; GOALS: PALLIATIVE  CURRENT/MOST RECENT THERAPY - CARBO-ETOP-TECENTRIQ [C]    Primary cancer of right lower lobe of lung ([place])  07/12/2015 Initial Diagnosis   Primary cancer of right lower lobe  of lung ([place])   08/17/2019 -  Chemotherapy   The patient had palonosetron (ALOXI) injection 0.25 mg, 0.25 mg, Intravenous,  Once, 1 of 4 cycles Administration: 0.25 mg (08/17/2019) pegfilgrastim-jmdb (FULPHILA) injection 6 mg, 6 mg, Subcutaneous,  Once, 1 of 4 cycles Administration: 6 mg (08/22/2019) CARBOplatin (PARAPLATIN) 690 mg in sodium chloride 0.9 % 250 mL chemo infusion, 690 mg (100 % of original dose 693 mg), Intravenous,  Once, 1 of 4 cycles Dose modification:   (original dose 693 mg, Cycle 1) Administration: 690 mg (08/17/2019) etoposide (VEPESID) 210 mg in sodium chloride 0.9 % 1,000 mL chemo infusion, 100 mg/m2 = 210 mg, Intravenous,  Once, 1 of 4 cycles Administration: 210 mg (08/17/2019), 210 mg (08/18/2019), 210 mg (08/19/2019) fosaprepitant (EMEND) 150 mg in sodium chloride 0.9 % 145 mL IVPB, 150 mg, Intravenous,  Once, 1 of 4 cycles Administration: 150 mg (08/17/2019) atezolizumab (TECENTRIQ) 1,200 mg in sodium chloride 0.9 % 250 mL chemo infusion, 1,200 mg, Intravenous, Once, 1 of 8 cycles Administration: 1,200 mg (08/17/2019)  for chemotherapy treatment.      ALLERGIES:  is allergic to amoxicillin-pot clavulanate and sulfamethoxazole-trimethoprim.  MEDICATIONS:  Current Outpatient Medications  Medication Sig Dispense Refill  . albuterol (PROVENTIL HFA;VENTOLIN HFA) 108 (90 Base) MCG/ACT inhaler Inhale 2 puffs into the lungs every 4 (four) hours as needed for wheezing or shortness of breath.    . ALPRAZolam (XANAX) 0.5 MG tablet Take 1 tablet (0.5 mg total) by mouth 2 (two) times daily as needed for anxiety. 10 tablet 0  . ATROVENT HFA 17 MCG/ACT inhaler Inhale 1 puff into the lungs daily.    . benzonatate (TESSALON) 100 MG capsule Take 1 capsule (100 mg total) by mouth 3 (three) times daily. 20 capsule 0  . chlorpheniramine-HYDROcodone (TUSSIONEX) 10-8 MG/5ML SUER Take 5 mLs by mouth every 12 (twelve) hours as needed for cough. 140 mL 0  . cyclobenzaprine (FLEXERIL) 10 MG tablet Take  10 mg by mouth 3 (three) times daily.     Marland Kitchen diltiazem (CARDIZEM CD) 120 MG 24 hr capsule Take 1 capsule (120 mg total) by mouth daily. 30 capsule 0  . divalproex (DEPAKOTE) 500 MG DR tablet Take 1 tablet (500 mg total) by mouth 2 (two) times daily. 60 tablet 0  . feeding supplement, ENSURE ENLIVE, (ENSURE ENLIVE) LIQD Take 237 mLs by mouth 3 (three) times daily between meals. 23700 mL 0  . fluconazole (DIFLUCAN) 100 MG tablet Take 1 tablet (100 mg total) by mouth daily. 7 tablet 0  . fluticasone (FLONASE) 50 MCG/ACT nasal spray daily as needed.     Marland Kitchen guaiFENesin (MUCINEX) 600 MG 12 hr tablet Take 2 tablets (1,200 mg total) by mouth 2 (two) times daily. 60 tablet 0  . ipratropium-albuterol (DUONEB) 0.5-2.5 (3) MG/3ML SOLN Take 3 mLs by nebulization every 4 (four) hours as needed.    . loratadine (CLARITIN) 10 MG tablet Take 10 mg by mouth daily as needed.     . meclizine (ANTIVERT) 12.5 MG tablet Take 1 tablet (12.5 mg total) by mouth 3 (three) times daily as needed for dizziness. 30 tablet 0  . meloxicam (MOBIC) 7.5 MG tablet Take 7.5 mg  by mouth 2 (two) times daily.    . montelukast (SINGULAIR) 10 MG tablet TAKE 1 TABLET BY MOUTH AT BEDTIME 60 tablet 1  . Multiple Vitamin (MULTIVITAMIN) tablet Take 1 tablet by mouth daily.    Marland Kitchen omeprazole (PRILOSEC) 40 MG capsule Take 40 mg by mouth daily.     . ondansetron (ZOFRAN) 8 MG tablet Take 1 tablet (8 mg total) by mouth every 8 (eight) hours as needed for nausea or vomiting. 20 tablet 3  . oxyCODONE-acetaminophen (PERCOCET/ROXICET) 5-325 MG tablet Take 1 tablet by mouth every 4 (four) hours as needed for moderate pain or severe pain. 30 tablet 0  . polyethylene glycol (MIRALAX / GLYCOLAX) 17 g packet Take 17 g by mouth daily. 14 each 0  . prochlorperazine (COMPAZINE) 10 MG tablet Take 1 tablet (10 mg total) by mouth every 6 (six) hours as needed for nausea or vomiting. 30 tablet 3  . risperiDONE (RISPERDAL) 1 MG tablet Take 1 tablet (1 mg total) by mouth  at bedtime. (Patient taking differently: Take 1 mg by mouth 2 (two) times daily. ) 30 tablet 0  . salmeterol (SEREVENT) 50 MCG/DOSE diskus inhaler Inhale 1 puff into the lungs 2 (two) times daily.    . sodium chloride 1 g tablet Take 1 tablet (1 g total) by mouth 3 (three) times daily with meals. 10 tablet 0   No current facility-administered medications for this visit.    VITAL SIGNS: BP 93/67 (BP Location: Right Arm, Patient Position: Sitting)   Pulse 95   Temp (!) 95.8 F (35.4 C) (Tympanic)   SpO2 96% Comment: down to 80's when patient falls asleep Filed Weights    Estimated body mass index is 31.48 kg/m as calculated from the following:   Height as of 08/27/19: '5\' 8"'$  (1.727 m).   Weight as of 08/27/19: 207 lb 0.2 oz (93.9 kg).  LABS: CBC:    Component Value Date/Time   WBC 11.4 (H) 09/06/2019 1044   HGB 9.7 (L) 09/06/2019 1044   HCT 29.2 (L) 09/06/2019 1044   PLT 265 09/06/2019 1044   MCV 92.1 09/06/2019 1044   NEUTROABS 8.1 (H) 09/06/2019 1044   LYMPHSABS 1.4 09/06/2019 1044   MONOABS 0.9 09/06/2019 1044   EOSABS 0.0 09/06/2019 1044   BASOSABS 0.0 09/06/2019 1044   Comprehensive Metabolic Panel:    Component Value Date/Time   NA 128 (L) 09/06/2019 1044   K 4.3 09/06/2019 1044   CL 93 (L) 09/06/2019 1044   CO2 25 09/06/2019 1044   BUN 8 09/06/2019 1044   CREATININE 0.53 09/06/2019 1044   GLUCOSE 101 (H) 09/06/2019 1044   CALCIUM 8.5 (L) 09/06/2019 1044   AST 26 09/06/2019 1044   ALT 15 09/06/2019 1044   ALKPHOS 103 09/06/2019 1044   BILITOT 0.2 (L) 09/06/2019 1044   PROT 6.0 (L) 09/06/2019 1044   ALBUMIN 3.1 (L) 09/06/2019 1044    RADIOGRAPHIC STUDIES: MR Brain W Wo Contrast  Result Date: 08/22/2019 CLINICAL DATA:  Worsening dizziness, metastatic lung cancer EXAM: MRI HEAD WITHOUT AND WITH CONTRAST TECHNIQUE: Multiplanar, multiecho pulse sequences of the brain and surrounding structures were obtained without and with intravenous contrast. CONTRAST:  2m  GADAVIST GADOBUTROL 1 MMOL/ML IV SOLN COMPARISON:  2017 FINDINGS: Brain: There is no acute infarction or intracranial hemorrhage. There is no intracranial mass, mass effect, or edema. There is no hydrocephalus or extra-axial fluid collection. Confluent areas of T2 hyperintensity in the supratentorial white matter are nonspecific and probably reflect therapy  related changes given significant progression since 2017. No abnormal enhancement. Vascular: Major vessel flow voids at the skull base are preserved. Skull and upper cervical spine: Normal marrow signal is preserved. Sinuses/Orbits: Partial opacification of the maxillary sinuses. There is evidence of chronic right maxillary sinusitis. Orbits are unremarkable. Orbits are unremarkable. Other: Sella is unremarkable.  Mastoid air cells are clear. IMPRESSION: No evidence of intracranial metastatic disease. No acute infarction or hemorrhage. Significant progression of white matter disease since 2017 likely reflecting sequelae of therapy. Electronically Signed   By: Macy Mis M.D.   On: 08/22/2019 14:03   NM PET Image Restag (PS) Skull Base To Thigh  Result Date: 08/11/2019 CLINICAL DATA:  Subsequent treatment strategy for small cell lung cancer. EXAM: NUCLEAR MEDICINE PET SKULL BASE TO THIGH TECHNIQUE: 10.85 mCi F-18 FDG was injected intravenously. Full-ring PET imaging was performed from the skull base to thigh after the radiotracer. CT data was obtained and used for attenuation correction and anatomic localization. Fasting blood glucose: 82 mg/dl COMPARISON:  Chest CT 08/05/2019.  Remote PET-CT 07/16/2015 FINDINGS: Mediastinal blood pool activity: SUV max 2.32 Liver activity: SUV max NA NECK: No hypermetabolic lymph nodes in the neck. Incidental CT findings: none CHEST: 6.5 x 5.5 cm cavitary mass in the right upper lobe invading the right hilum is hypermetabolic with SUV max of 16.55 consistent with recurrent tumor. Enlarged right paratracheal lymph nodes  are also hypermetabolic and consistent with metastatic adenopathy. 8 mm node on image 72/3 has an SUV max of 4.99 and slightly more inferiorly there is a 12 mm node on image 78/3 which has an SUV max of 7.77. No supraclavicular or axillary adenopathy. No breast masses. Areas of hypermetabolism in the supraclavicular fossas bilaterally without associated CT correlate most consistent with hypermetabolic brown fat. No obvious pulmonary nodules to suggest pulmonary metastatic disease. Incidental CT findings: none ABDOMEN/PELVIS: Hypermetabolic lesion in the left hepatic lobe is suspicious for a new hepatic metastatic lesion. This appears to correlate with a vague low-attenuation lesion measuring 14 mm on image 126/3. No other definite hepatic lesions. No adrenal gland lesions are identified. The pancreas, spleen and both kidneys are unremarkable. No abdominal or pelvic lymphadenopathy. Incidental CT findings: none SKELETON: No focal hypermetabolic activity to suggest skeletal metastasis. Incidental CT findings: none IMPRESSION: 1. Recurrent right upper lobe/right hilar tumor and associated adjacent metastatic mediastinal adenopathy. 2. Suspect new left hepatic lobe metastatic lesion. 3. No definite metastatic pulmonary nodules. 4. No findings for osseous metastatic disease. Electronically Signed   By: Marijo Sanes M.D.   On: 08/11/2019 15:15   DG Chest Port 1 View  Result Date: 08/27/2019 CLINICAL DATA:  SOB, constipation. Patient admitted on 08/24/2019 for hyponatremia. Patient currently undergoing treatment for lung cancer and liver cancer per patient. Hx of asthma, COPD, HTN, appendectomy, stomach surgery, abdominal hysterectomy, former smoker. EXAM: PORTABLE CHEST 1 VIEW COMPARISON:  03/08/2018.  Chest CT, 08/05/2019. FINDINGS: Ill-defined right hilar mass is similar to the recent prior chest CT, increased when compared to the prior chest radiograph. Linear/reticular opacities extend from the right hilar mass,  also similar to the prior chest CT. Mild opacity at the left lung base is consistent with atelectasis. No convincing pneumonia. No pulmonary edema. No pleural effusion or pneumothorax. Cardiac silhouette is normal in size.  Normal left hilar contour. Right anterior chest wall Port-A-Cath is stable from the prior chest CT. Skeletal structures are grossly intact. IMPRESSION: 1. No acute cardiopulmonary disease. 2. Changes on the right reflect the patient's  known lung carcinoma, similar in appearance to the recent prior chest CT. No convincing pneumonia and no pulmonary edema. Electronically Signed   By: Lajean Manes M.D.   On: 08/27/2019 11:04   DG Abd Portable 1V  Result Date: 08/27/2019 CLINICAL DATA:  SOB, constipation. Patient admitted on 08/24/2019 for hyponatremia. Patient currently undergoing treatment for lung cancer and liver cancer per patient. Hx of asthma, COPD, HTN, appendectomy, stomach surgery, abdominal hysterectomy, former smoker. EXAM: PORTABLE ABDOMEN - 1 VIEW COMPARISON:  CT, 01/25/2016 FINDINGS: Normal bowel gas pattern.  Mild increased right colon stool burden. No evidence of renal or ureteral stones. Soft tissues are unremarkable. Status post right hip pinning.  No acute skeletal abnormality. IMPRESSION: 1. No acute findings.  No evidence of bowel obstruction. 2. Mild increased right colon stool burden. Electronically Signed   By: Lajean Manes M.D.   On: 08/27/2019 11:06   [place] CORE BIOPSY (LIVER)  Result Date: 08/15/2019 CLINICAL DATA:  Right lower lobe lung small-cell carcinoma. Hypermetabolic liver lesion on recent PET-CT. EXAM: ULTRASOUND-GUIDED CORE LIVER BIOPSY TECHNIQUE: An ultrasound guided liver biopsy was thoroughly discussed with the patient and questions were answered. The benefits, risks, alternatives, and complications were also discussed. The patient understands and wishes to proceed with the procedure. A verbal as well as written consent was obtained. Survey ultrasound of  the liver was performed. At least 3 low-attenuation lesions measuring up to 1.5 cm were identified, in both lobes. The left lesion was inaccessible due to overlying costal cartilage. Therefore, a right anterior lesion was approached. An appropriate skin entry site was determined. Skin site was marked, prepped with chlorhexidine , and draped in usual sterile fashion, and infiltrated locally with 1% lidocaine. Intravenous Fentanyl 135mg and Versed '2mg'$  were administered as conscious sedation during continuous monitoring of the patient's level of consciousness and physiological / cardiorespiratory status by the radiology RN, with a total moderate sedation time of 35 minutes. A 17 gauge trocar needle was advanced under ultrasound guidance into the liver. 4 coaxial 18gauge core samples were then obtained through the guide needle. The guide needle was removed. Post procedure scans demonstrate no apparent complication. COMPLICATIONS: COMPLICATIONS None immediate FINDINGS: At least 3 hypoechoic liver lesions were identified. Representative core biopsy samples obtained as above. IMPRESSION: 1. Technically successful ultrasound guided core liver lesion biopsy. Electronically Signed   By: DLucrezia EuropeM.D.   On: 08/15/2019 12:08    PERFORMANCE STATUS (ECOG) : 3  Review of Systems Unless otherwise noted, a complete review of systems is negative.  Physical Exam General: Ill-appearing Pulmonary: Expiratory wheezing Abdomen: Soft, nontender to palp Extremities: Bilateral lower extremity edema, no joint deformities Skin: no rashes Neurological: Weakness but otherwise nonfocal  IMPRESSION: Patient was an add-on to my clinic schedule today due to overall decline.  She has had progressive weakness.  She is now unable to stand on her own and barely able to take a couple steps even with assistance.  She is mostly bed/chair bound.  Her oral intake is poor.  She is only eating bites and drinking sips.  She has transient  lethargy and has to be prompted to stay awake to engage in conversation.  She endorses progressive dyspnea and generalized pain.  Labs reveal worsening hyponatremia.  Dr. BRogue Bussingand I met with patient, mother, and father.  All verbalized an understanding that patient is no longer a candidate for treatment of any sort.  Her decline is thought secondary to sequelae from disease progression.  It appears  that she is approaching end-of-life.  Hospice was recommended but parents do not feel that they can take care of her any longer at home.  Patient and family instead want to pursue the Hospice Home if a bed is available.  Both patient and family verbalized an understanding that the Hospice Home would only focus on comfort/end of life care.  They are familiar with the hospice home from the care of their son (patient's brother).  I did speak with the hospice liaison who says that there is a bed available at the hospice home and will coordinate admission later today.  Parents feel that they can transport her over there in their private vehicle.  DNR was confirmed and a new order signed.  PLAN: -Comfort care -Transfer to the Hospice Home -DNR/DNI  Case and plan discussed with Dr. Rogue Bussing  Patient expressed understanding and was in agreement with this plan.     Time Total: 30 minutes  Visit consisted of counseling and education dealing with the complex and emotionally intense issues of symptom management and palliative care in the setting of serious and potentially life-threatening illness.Greater than 50%  of this time was spent counseling and coordinating care related to the above assessment and plan.  Signed by: Altha Harm, PhD, NP-C

## 2019-09-06 NOTE — Telephone Encounter (Signed)
Returned call to patient's mother who shared that she has noted some changes in patient and inquired if a visit could be made by NP. Visit scheduled for 09/06/19 @ 2:30pm. Mom requested Palliative care to call before coming to ensure patient has returned from visits at [facility]

## 2019-09-07 ENCOUNTER — Inpatient Hospital Stay: Payer: Medicare Other | Admitting: Hospice and Palliative Medicine

## 2019-09-07 ENCOUNTER — Inpatient Hospital Stay: Payer: Medicare Other

## 2019-09-07 ENCOUNTER — Inpatient Hospital Stay: Payer: Medicare Other | Admitting: Internal Medicine

## 2019-09-08 ENCOUNTER — Inpatient Hospital Stay: Payer: Medicare Other

## 2019-09-09 ENCOUNTER — Ambulatory Visit: Payer: Medicare Other

## 2019-10-04 ENCOUNTER — Other Ambulatory Visit: Payer: Medicare Other | Admitting: Primary Care

## 2019-10-15 DEATH — deceased
